# Patient Record
Sex: Male | Born: 1935 | Race: Black or African American | Hispanic: No | Marital: Married | Smoking: Never smoker
Health system: Southern US, Community
[De-identification: ages and names within clinical notes are randomized; demographics above are authoritative.]

## PROBLEM LIST (undated history)

## (undated) DIAGNOSIS — E785 Hyperlipidemia, unspecified: Secondary | ICD-10-CM

## (undated) DIAGNOSIS — L0390 Cellulitis, unspecified: Secondary | ICD-10-CM

## (undated) DIAGNOSIS — F419 Anxiety disorder, unspecified: Secondary | ICD-10-CM

## (undated) DIAGNOSIS — I2510 Atherosclerotic heart disease of native coronary artery without angina pectoris: Secondary | ICD-10-CM

## (undated) DIAGNOSIS — N183 Chronic kidney disease, stage 3 unspecified: Secondary | ICD-10-CM

## (undated) DIAGNOSIS — I10 Essential (primary) hypertension: Secondary | ICD-10-CM

## (undated) DIAGNOSIS — F0390 Unspecified dementia without behavioral disturbance: Secondary | ICD-10-CM

## (undated) DIAGNOSIS — F32A Depression, unspecified: Secondary | ICD-10-CM

## (undated) DIAGNOSIS — I5032 Chronic diastolic (congestive) heart failure: Secondary | ICD-10-CM

## (undated) DIAGNOSIS — K219 Gastro-esophageal reflux disease without esophagitis: Secondary | ICD-10-CM

## (undated) DIAGNOSIS — F329 Major depressive disorder, single episode, unspecified: Secondary | ICD-10-CM

## (undated) DIAGNOSIS — E669 Obesity, unspecified: Secondary | ICD-10-CM

## (undated) DIAGNOSIS — E119 Type 2 diabetes mellitus without complications: Secondary | ICD-10-CM

## (undated) DIAGNOSIS — J449 Chronic obstructive pulmonary disease, unspecified: Secondary | ICD-10-CM

## (undated) DIAGNOSIS — N289 Disorder of kidney and ureter, unspecified: Secondary | ICD-10-CM

---

## 2003-06-08 ENCOUNTER — Other Ambulatory Visit: Payer: Self-pay

## 2004-10-18 ENCOUNTER — Emergency Department: Payer: Self-pay | Admitting: Emergency Medicine

## 2004-10-18 ENCOUNTER — Other Ambulatory Visit: Payer: Self-pay

## 2004-10-21 ENCOUNTER — Other Ambulatory Visit: Payer: Self-pay

## 2004-10-21 ENCOUNTER — Inpatient Hospital Stay: Payer: Self-pay | Admitting: Internal Medicine

## 2004-11-16 ENCOUNTER — Other Ambulatory Visit: Payer: Self-pay

## 2004-11-16 ENCOUNTER — Emergency Department: Payer: Self-pay | Admitting: Emergency Medicine

## 2005-05-31 ENCOUNTER — Emergency Department: Payer: Self-pay | Admitting: Emergency Medicine

## 2005-05-31 ENCOUNTER — Other Ambulatory Visit: Payer: Self-pay

## 2006-04-13 ENCOUNTER — Emergency Department: Payer: Self-pay | Admitting: General Practice

## 2006-04-13 ENCOUNTER — Other Ambulatory Visit: Payer: Self-pay

## 2009-11-07 ENCOUNTER — Emergency Department: Payer: Self-pay | Admitting: Emergency Medicine

## 2010-01-25 ENCOUNTER — Ambulatory Visit: Payer: Self-pay | Admitting: Geriatric Medicine

## 2010-01-26 ENCOUNTER — Emergency Department: Payer: Self-pay | Admitting: Emergency Medicine

## 2010-01-31 ENCOUNTER — Ambulatory Visit: Payer: Self-pay | Admitting: Geriatric Medicine

## 2010-02-07 ENCOUNTER — Inpatient Hospital Stay: Payer: Self-pay | Admitting: Internal Medicine

## 2010-04-08 ENCOUNTER — Other Ambulatory Visit: Payer: Self-pay | Admitting: Geriatric Medicine

## 2011-01-31 ENCOUNTER — Encounter: Payer: Self-pay | Admitting: Cardiothoracic Surgery

## 2011-01-31 ENCOUNTER — Encounter: Payer: Self-pay | Admitting: Nurse Practitioner

## 2011-02-08 ENCOUNTER — Encounter: Payer: Self-pay | Admitting: Nurse Practitioner

## 2011-02-08 ENCOUNTER — Encounter: Payer: Self-pay | Admitting: Cardiothoracic Surgery

## 2011-03-08 ENCOUNTER — Encounter: Payer: Self-pay | Admitting: Nurse Practitioner

## 2011-03-08 ENCOUNTER — Encounter: Payer: Self-pay | Admitting: Cardiothoracic Surgery

## 2011-04-08 ENCOUNTER — Encounter: Payer: Self-pay | Admitting: Nurse Practitioner

## 2011-04-08 ENCOUNTER — Encounter: Payer: Self-pay | Admitting: Cardiothoracic Surgery

## 2011-04-08 LAB — WOUND CULTURE

## 2011-05-08 ENCOUNTER — Encounter: Payer: Self-pay | Admitting: Cardiothoracic Surgery

## 2011-05-08 ENCOUNTER — Encounter: Payer: Self-pay | Admitting: Nurse Practitioner

## 2011-06-08 ENCOUNTER — Encounter: Payer: Self-pay | Admitting: Cardiothoracic Surgery

## 2011-06-08 ENCOUNTER — Encounter: Payer: Self-pay | Admitting: Nurse Practitioner

## 2011-07-08 ENCOUNTER — Encounter: Payer: Self-pay | Admitting: Nurse Practitioner

## 2011-07-08 ENCOUNTER — Encounter: Payer: Self-pay | Admitting: Cardiothoracic Surgery

## 2011-08-08 ENCOUNTER — Encounter: Payer: Self-pay | Admitting: Nurse Practitioner

## 2011-08-08 ENCOUNTER — Encounter: Payer: Self-pay | Admitting: Cardiothoracic Surgery

## 2011-09-08 ENCOUNTER — Encounter: Payer: Self-pay | Admitting: Cardiothoracic Surgery

## 2011-09-08 ENCOUNTER — Encounter: Payer: Self-pay | Admitting: Nurse Practitioner

## 2011-10-08 ENCOUNTER — Encounter: Payer: Self-pay | Admitting: Cardiothoracic Surgery

## 2011-10-08 ENCOUNTER — Encounter: Payer: Self-pay | Admitting: Nurse Practitioner

## 2013-07-04 ENCOUNTER — Other Ambulatory Visit: Payer: Self-pay

## 2013-07-04 LAB — CBC WITH DIFFERENTIAL/PLATELET
BASOS ABS: 0 10*3/uL (ref 0.0–0.1)
Basophil %: 0.8 %
EOS ABS: 0.6 10*3/uL (ref 0.0–0.7)
EOS PCT: 9.8 %
HCT: 35.5 % — ABNORMAL LOW (ref 40.0–52.0)
HGB: 11.5 g/dL — ABNORMAL LOW (ref 13.0–18.0)
Lymphocyte #: 1.5 10*3/uL (ref 1.0–3.6)
Lymphocyte %: 25.7 %
MCH: 29.2 pg (ref 26.0–34.0)
MCHC: 32.5 g/dL (ref 32.0–36.0)
MCV: 90 fL (ref 80–100)
Monocyte #: 0.5 x10 3/mm (ref 0.2–1.0)
Monocyte %: 8.6 %
NEUTROS ABS: 3.2 10*3/uL (ref 1.4–6.5)
NEUTROS PCT: 55.1 %
PLATELETS: 177 10*3/uL (ref 150–440)
RBC: 3.96 10*6/uL — ABNORMAL LOW (ref 4.40–5.90)
RDW: 13.6 % (ref 11.5–14.5)
WBC: 5.9 10*3/uL (ref 3.8–10.6)

## 2013-07-04 LAB — COMPREHENSIVE METABOLIC PANEL
ALK PHOS: 59 U/L
ALT: 32 U/L (ref 12–78)
AST: 36 U/L (ref 15–37)
Albumin: 3.6 g/dL (ref 3.4–5.0)
Anion Gap: 6 — ABNORMAL LOW (ref 7–16)
BILIRUBIN TOTAL: 0.3 mg/dL (ref 0.2–1.0)
BUN: 21 mg/dL — AB (ref 7–18)
CALCIUM: 8.7 mg/dL (ref 8.5–10.1)
CHLORIDE: 98 mmol/L (ref 98–107)
Co2: 29 mmol/L (ref 21–32)
Creatinine: 1.81 mg/dL — ABNORMAL HIGH (ref 0.60–1.30)
EGFR (Non-African Amer.): 35 — ABNORMAL LOW
GFR CALC AF AMER: 40 — AB
Glucose: 83 mg/dL (ref 65–99)
Osmolality: 268 (ref 275–301)
Potassium: 4.3 mmol/L (ref 3.5–5.1)
Sodium: 133 mmol/L — ABNORMAL LOW (ref 136–145)
Total Protein: 6.7 g/dL (ref 6.4–8.2)

## 2013-07-04 LAB — URINALYSIS, COMPLETE
BLOOD: NEGATIVE
Bacteria: NONE SEEN
Bilirubin,UR: NEGATIVE
Glucose,UR: NEGATIVE mg/dL (ref 0–75)
Ketone: NEGATIVE
Leukocyte Esterase: NEGATIVE
Nitrite: NEGATIVE
PH: 8 (ref 4.5–8.0)
RBC,UR: 1 /HPF (ref 0–5)
SPECIFIC GRAVITY: 1.012 (ref 1.003–1.030)
Squamous Epithelial: 4
WBC UR: NONE SEEN /HPF (ref 0–5)

## 2014-10-10 ENCOUNTER — Other Ambulatory Visit
Admission: RE | Admit: 2014-10-10 | Discharge: 2014-10-10 | Disposition: A | Discharge status: Home / Self Care | Payer: Medicare Other | Source: Skilled Nursing Facility | Attending: Internal Medicine | Admitting: Internal Medicine

## 2014-10-10 DIAGNOSIS — Z029 Encounter for administrative examinations, unspecified: Secondary | ICD-10-CM | POA: Insufficient documentation

## 2014-10-10 LAB — CBC WITH DIFFERENTIAL/PLATELET
Basophils Absolute: 0 10*3/uL (ref 0–0.1)
Basophils Relative: 0 %
EOS PCT: 2 %
Eosinophils Absolute: 0.2 10*3/uL (ref 0–0.7)
HCT: 30.4 % — ABNORMAL LOW (ref 40.0–52.0)
Hemoglobin: 9.7 g/dL — ABNORMAL LOW (ref 13.0–18.0)
LYMPHS ABS: 0.8 10*3/uL — AB (ref 1.0–3.6)
LYMPHS PCT: 12 %
MCH: 28 pg (ref 26.0–34.0)
MCHC: 31.8 g/dL — ABNORMAL LOW (ref 32.0–36.0)
MCV: 88.2 fL (ref 80.0–100.0)
MONO ABS: 0.5 10*3/uL (ref 0.2–1.0)
MONOS PCT: 9 %
Neutro Abs: 4.8 10*3/uL (ref 1.4–6.5)
Neutrophils Relative %: 77 %
PLATELETS: 166 10*3/uL (ref 150–440)
RBC: 3.44 MIL/uL — AB (ref 4.40–5.90)
RDW: 15.7 % — ABNORMAL HIGH (ref 11.5–14.5)
WBC: 6.3 10*3/uL (ref 3.8–10.6)

## 2014-10-11 ENCOUNTER — Emergency Department: Payer: Medicare Other

## 2014-10-11 ENCOUNTER — Encounter: Payer: Self-pay | Admitting: *Deleted

## 2014-10-11 ENCOUNTER — Inpatient Hospital Stay
Admission: EM | Admit: 2014-10-11 | Discharge: 2014-10-17 | DRG: 291 | Disposition: A | Discharge status: Skilled Nursing Facility | Payer: Medicare Other | Attending: Internal Medicine | Admitting: Internal Medicine

## 2014-10-11 DIAGNOSIS — N183 Chronic kidney disease, stage 3 unspecified: Secondary | ICD-10-CM | POA: Diagnosis present

## 2014-10-11 DIAGNOSIS — Z79899 Other long term (current) drug therapy: Secondary | ICD-10-CM

## 2014-10-11 DIAGNOSIS — J449 Chronic obstructive pulmonary disease, unspecified: Secondary | ICD-10-CM | POA: Diagnosis present

## 2014-10-11 DIAGNOSIS — I5033 Acute on chronic diastolic (congestive) heart failure: Secondary | ICD-10-CM | POA: Diagnosis present

## 2014-10-11 DIAGNOSIS — J9621 Acute and chronic respiratory failure with hypoxia: Secondary | ICD-10-CM | POA: Diagnosis present

## 2014-10-11 DIAGNOSIS — Z794 Long term (current) use of insulin: Secondary | ICD-10-CM | POA: Diagnosis not present

## 2014-10-11 DIAGNOSIS — I130 Hypertensive heart and chronic kidney disease with heart failure and stage 1 through stage 4 chronic kidney disease, or unspecified chronic kidney disease: Secondary | ICD-10-CM | POA: Diagnosis present

## 2014-10-11 DIAGNOSIS — E875 Hyperkalemia: Secondary | ICD-10-CM | POA: Diagnosis present

## 2014-10-11 DIAGNOSIS — Z880 Allergy status to penicillin: Secondary | ICD-10-CM

## 2014-10-11 DIAGNOSIS — E1122 Type 2 diabetes mellitus with diabetic chronic kidney disease: Secondary | ICD-10-CM | POA: Diagnosis present

## 2014-10-11 DIAGNOSIS — K219 Gastro-esophageal reflux disease without esophagitis: Secondary | ICD-10-CM | POA: Diagnosis present

## 2014-10-11 DIAGNOSIS — J9601 Acute respiratory failure with hypoxia: Secondary | ICD-10-CM | POA: Diagnosis present

## 2014-10-11 DIAGNOSIS — I509 Heart failure, unspecified: Secondary | ICD-10-CM

## 2014-10-11 DIAGNOSIS — L03116 Cellulitis of left lower limb: Secondary | ICD-10-CM

## 2014-10-11 DIAGNOSIS — E669 Obesity, unspecified: Secondary | ICD-10-CM | POA: Diagnosis present

## 2014-10-11 DIAGNOSIS — I2510 Atherosclerotic heart disease of native coronary artery without angina pectoris: Secondary | ICD-10-CM | POA: Diagnosis present

## 2014-10-11 DIAGNOSIS — E119 Type 2 diabetes mellitus without complications: Secondary | ICD-10-CM

## 2014-10-11 DIAGNOSIS — I10 Essential (primary) hypertension: Secondary | ICD-10-CM | POA: Insufficient documentation

## 2014-10-11 HISTORY — DX: Chronic diastolic (congestive) heart failure: I50.32

## 2014-10-11 HISTORY — DX: Chronic obstructive pulmonary disease, unspecified: J44.9

## 2014-10-11 HISTORY — DX: Anxiety disorder, unspecified: F41.9

## 2014-10-11 HISTORY — DX: Hyperlipidemia, unspecified: E78.5

## 2014-10-11 HISTORY — DX: Gastro-esophageal reflux disease without esophagitis: K21.9

## 2014-10-11 HISTORY — DX: Disorder of kidney and ureter, unspecified: N28.9

## 2014-10-11 HISTORY — DX: Chronic kidney disease, stage 3 (moderate): N18.3

## 2014-10-11 HISTORY — DX: Type 2 diabetes mellitus without complications: E11.9

## 2014-10-11 HISTORY — DX: Chronic kidney disease, stage 3 unspecified: N18.30

## 2014-10-11 HISTORY — DX: Atherosclerotic heart disease of native coronary artery without angina pectoris: I25.10

## 2014-10-11 HISTORY — DX: Essential (primary) hypertension: I10

## 2014-10-11 HISTORY — DX: Obesity, unspecified: E66.9

## 2014-10-11 LAB — BASIC METABOLIC PANEL
Anion gap: 6 (ref 5–15)
BUN: 54 mg/dL — AB (ref 6–20)
CHLORIDE: 98 mmol/L — AB (ref 101–111)
CO2: 28 mmol/L (ref 22–32)
CREATININE: 1.59 mg/dL — AB (ref 0.61–1.24)
Calcium: 8.7 mg/dL — ABNORMAL LOW (ref 8.9–10.3)
GFR calc Af Amer: 46 mL/min — ABNORMAL LOW (ref >60)
GFR calc non Af Amer: 39 mL/min — ABNORMAL LOW (ref >60)
Glucose, Bld: 213 mg/dL — ABNORMAL HIGH (ref 65–99)
POTASSIUM: 5.4 mmol/L — AB (ref 3.5–5.1)
Sodium: 132 mmol/L — ABNORMAL LOW (ref 135–145)

## 2014-10-11 LAB — CBC WITH DIFFERENTIAL/PLATELET
Band Neutrophils: 2 %
Basophils Absolute: 0 10*3/uL (ref 0–0.1)
Basophils Relative: 0 %
Blasts: 0 %
Eosinophils Absolute: 0.1 10*3/uL (ref 0–0.7)
Eosinophils Relative: 3 %
HCT: 31.3 % — ABNORMAL LOW (ref 40.0–52.0)
HEMOGLOBIN: 10.1 g/dL — AB (ref 13.0–18.0)
Lymphocytes Relative: 21 %
Lymphs Abs: 1 10*3/uL (ref 1.0–3.6)
MCH: 28.2 pg (ref 26.0–34.0)
MCHC: 32.2 g/dL (ref 32.0–36.0)
MCV: 87.4 fL (ref 80.0–100.0)
MYELOCYTES: 0 %
Metamyelocytes Relative: 1 %
Monocytes Absolute: 0.3 10*3/uL (ref 0.2–1.0)
Monocytes Relative: 6 %
NEUTROS PCT: 67 %
NRBC: 1 /100{WBCs} — AB
Neutro Abs: 3.5 10*3/uL (ref 1.4–6.5)
Other: 0 %
PROMYELOCYTES ABS: 0 %
Platelets: 168 10*3/uL (ref 150–440)
RBC: 3.59 MIL/uL — AB (ref 4.40–5.90)
RDW: 15.6 % — ABNORMAL HIGH (ref 11.5–14.5)
WBC: 4.9 10*3/uL (ref 3.8–10.6)

## 2014-10-11 LAB — GLUCOSE, CAPILLARY
GLUCOSE-CAPILLARY: 47 mg/dL — AB (ref 65–99)
Glucose-Capillary: 141 mg/dL — ABNORMAL HIGH (ref 65–99)
Glucose-Capillary: 154 mg/dL — ABNORMAL HIGH (ref 65–99)
Glucose-Capillary: 88 mg/dL (ref 65–99)

## 2014-10-11 LAB — LACTIC ACID, PLASMA: Lactic Acid, Venous: 0.8 mmol/L (ref 0.5–2.0)

## 2014-10-11 LAB — COMPREHENSIVE METABOLIC PANEL
ALT: 29 U/L (ref 17–63)
AST: 25 U/L (ref 15–41)
Albumin: 4.1 g/dL (ref 3.5–5.0)
Alkaline Phosphatase: 71 U/L (ref 38–126)
Anion gap: 7 (ref 5–15)
BUN: 53 mg/dL — AB (ref 6–20)
CHLORIDE: 98 mmol/L — AB (ref 101–111)
CO2: 31 mmol/L (ref 22–32)
Calcium: 9.2 mg/dL (ref 8.9–10.3)
Creatinine, Ser: 1.44 mg/dL — ABNORMAL HIGH (ref 0.61–1.24)
GFR, EST AFRICAN AMERICAN: 51 mL/min — AB (ref >60)
GFR, EST NON AFRICAN AMERICAN: 44 mL/min — AB (ref >60)
Glucose, Bld: 64 mg/dL — ABNORMAL LOW (ref 65–99)
POTASSIUM: 5.1 mmol/L (ref 3.5–5.1)
Sodium: 136 mmol/L (ref 135–145)
Total Bilirubin: 0.2 mg/dL — ABNORMAL LOW (ref 0.3–1.2)
Total Protein: 7.5 g/dL (ref 6.5–8.1)

## 2014-10-11 LAB — BRAIN NATRIURETIC PEPTIDE: B NATRIURETIC PEPTIDE 5: 568 pg/mL — AB (ref 0.0–100.0)

## 2014-10-11 LAB — TROPONIN I

## 2014-10-11 MED ORDER — ACETAMINOPHEN 325 MG PO TABS
650.0000 mg | ORAL_TABLET | Freq: Four times a day (QID) | ORAL | Status: DC | PRN
  Administered 2014-10-13 – 2014-10-14 (×4): 650 mg via ORAL
  Filled 2014-10-11 (×4): qty 2

## 2014-10-11 MED ORDER — SIMVASTATIN 20 MG PO TABS
20.0000 mg | ORAL_TABLET | Freq: Every day | ORAL | Status: DC
  Administered 2014-10-12 – 2014-10-16 (×6): 20 mg via ORAL
  Filled 2014-10-11 (×6): qty 1

## 2014-10-11 MED ORDER — ONDANSETRON HCL 4 MG PO TABS: 4.0000 mg | ORAL_TABLET | Freq: Four times a day (QID) | ORAL | Status: DC | PRN

## 2014-10-11 MED ORDER — LISINOPRIL 20 MG PO TABS
20.0000 mg | ORAL_TABLET | Freq: Two times a day (BID) | ORAL | Status: DC
  Administered 2014-10-12 (×3): 20 mg via ORAL
  Filled 2014-10-11 (×3): qty 1

## 2014-10-11 MED ORDER — HYDRALAZINE HCL 20 MG/ML IJ SOLN
10.0000 mg | INTRAMUSCULAR | Status: DC | PRN
  Administered 2014-10-13: 20 mg via INTRAVENOUS
  Administered 2014-10-14 – 2014-10-15 (×4): 10 mg via INTRAVENOUS
  Filled 2014-10-11 (×6): qty 1

## 2014-10-11 MED ORDER — TORSEMIDE 20 MG PO TABS
60.0000 mg | ORAL_TABLET | Freq: Every day | ORAL | Status: DC
  Administered 2014-10-12 – 2014-10-13 (×2): 60 mg via ORAL
  Filled 2014-10-11 (×2): qty 3

## 2014-10-11 MED ORDER — METOPROLOL SUCCINATE ER 100 MG PO TB24
200.0000 mg | ORAL_TABLET | Freq: Every day | ORAL | Status: DC
  Administered 2014-10-12 – 2014-10-17 (×6): 200 mg via ORAL
  Filled 2014-10-11 (×6): qty 2

## 2014-10-11 MED ORDER — AMLODIPINE BESYLATE 10 MG PO TABS
10.0000 mg | ORAL_TABLET | Freq: Every day | ORAL | Status: DC
  Administered 2014-10-12 – 2014-10-17 (×6): 10 mg via ORAL
  Filled 2014-10-11 (×6): qty 1

## 2014-10-11 MED ORDER — DEXTROSE 50 % IV SOLN
INTRAVENOUS | Status: AC
  Administered 2014-10-11: 50 mL via INTRAVENOUS
  Filled 2014-10-11: qty 50

## 2014-10-11 MED ORDER — DEXTROSE 250 MG/ML IV SOLN: 25.0000 g | Freq: Once | INTRAVENOUS | Status: DC

## 2014-10-11 MED ORDER — FUROSEMIDE 10 MG/ML IJ SOLN
40.0000 mg | Freq: Once | INTRAMUSCULAR | Status: AC
Start: 2014-10-11 — End: 2014-10-11
  Administered 2014-10-11: 40 mg via INTRAVENOUS
  Filled 2014-10-11: qty 4

## 2014-10-11 MED ORDER — DIVALPROEX SODIUM ER 500 MG PO TB24
1000.0000 mg | ORAL_TABLET | Freq: Every day | ORAL | Status: DC
  Administered 2014-10-12 – 2014-10-16 (×6): 1000 mg via ORAL
  Filled 2014-10-11 (×6): qty 2

## 2014-10-11 MED ORDER — ENALAPRILAT 1.25 MG/ML IV INJ
1.2500 mg | INJECTION | Freq: Once | INTRAVENOUS | Status: AC
Start: 2014-10-11 — End: 2014-10-11
  Administered 2014-10-11: 1.25 mg via INTRAVENOUS
  Filled 2014-10-11: qty 2

## 2014-10-11 MED ORDER — SODIUM CHLORIDE 0.9 % IJ SOLN
3.0000 mL | Freq: Two times a day (BID) | INTRAMUSCULAR | Status: DC
  Administered 2014-10-12 – 2014-10-17 (×12): 3 mL via INTRAVENOUS

## 2014-10-11 MED ORDER — ASPIRIN EC 81 MG PO TBEC
81.0000 mg | DELAYED_RELEASE_TABLET | Freq: Every day | ORAL | Status: DC
  Administered 2014-10-12 – 2014-10-17 (×6): 81 mg via ORAL
  Filled 2014-10-11 (×6): qty 1

## 2014-10-11 MED ORDER — HEPARIN SODIUM (PORCINE) 5000 UNIT/ML IJ SOLN
5000.0000 [IU] | Freq: Three times a day (TID) | INTRAMUSCULAR | Status: DC
  Administered 2014-10-12 – 2014-10-17 (×18): 5000 [IU] via SUBCUTANEOUS
  Filled 2014-10-11 (×18): qty 1

## 2014-10-11 MED ORDER — HYDRALAZINE HCL 25 MG PO TABS
25.0000 mg | ORAL_TABLET | Freq: Two times a day (BID) | ORAL | Status: DC
  Administered 2014-10-12 (×2): 25 mg via ORAL
  Filled 2014-10-11 (×2): qty 1

## 2014-10-11 MED ORDER — SERTRALINE HCL 25 MG PO TABS
25.0000 mg | ORAL_TABLET | Freq: Every day | ORAL | Status: DC
  Administered 2014-10-12 – 2014-10-17 (×6): 25 mg via ORAL
  Filled 2014-10-11 (×6): qty 1

## 2014-10-11 MED ORDER — INSULIN ASPART 100 UNIT/ML ~~LOC~~ SOLN
0.0000 [IU] | Freq: Three times a day (TID) | SUBCUTANEOUS | Status: DC
Start: 2014-10-12 — End: 2014-10-17
  Administered 2014-10-12: 3 [IU] via SUBCUTANEOUS
  Administered 2014-10-12: 5 [IU] via SUBCUTANEOUS
  Administered 2014-10-12: 2 [IU] via SUBCUTANEOUS
  Administered 2014-10-13: 3 [IU] via SUBCUTANEOUS
  Administered 2014-10-13: 2 [IU] via SUBCUTANEOUS
  Administered 2014-10-13: 3 [IU] via SUBCUTANEOUS
  Administered 2014-10-14: 2 [IU] via SUBCUTANEOUS
  Administered 2014-10-14: 7 [IU] via SUBCUTANEOUS
  Administered 2014-10-14: 5 [IU] via SUBCUTANEOUS
  Administered 2014-10-15: 1 [IU] via SUBCUTANEOUS
  Administered 2014-10-15: 2 [IU] via SUBCUTANEOUS
  Administered 2014-10-16 – 2014-10-17 (×4): 1 [IU] via SUBCUTANEOUS
  Filled 2014-10-11: qty 2
  Filled 2014-10-11: qty 1
  Filled 2014-10-11 (×3): qty 3
  Filled 2014-10-11 (×2): qty 5
  Filled 2014-10-11: qty 2
  Filled 2014-10-11: qty 1
  Filled 2014-10-11: qty 5
  Filled 2014-10-11: qty 2
  Filled 2014-10-11 (×2): qty 1
  Filled 2014-10-11: qty 7
  Filled 2014-10-11: qty 2
  Filled 2014-10-11: qty 1

## 2014-10-11 MED ORDER — DEXTROSE 5 % IV SOLN
Freq: Once | INTRAVENOUS | Status: AC
  Administered 2014-10-11: 19:00:00 via INTRAVENOUS

## 2014-10-11 MED ORDER — FUROSEMIDE 10 MG/ML IJ SOLN
40.0000 mg | Freq: Once | INTRAMUSCULAR | Status: AC
  Administered 2014-10-12: 40 mg via INTRAVENOUS
  Filled 2014-10-11: qty 4

## 2014-10-11 MED ORDER — CLONIDINE HCL 0.1 MG PO TABS
0.1000 mg | ORAL_TABLET | Freq: Two times a day (BID) | ORAL | Status: DC
  Administered 2014-10-12 – 2014-10-13 (×4): 0.1 mg via ORAL
  Filled 2014-10-11 (×4): qty 1

## 2014-10-11 MED ORDER — NITROGLYCERIN 2 % TD OINT
1.0000 [in_us] | TOPICAL_OINTMENT | Freq: Once | TRANSDERMAL | Status: AC
  Administered 2014-10-11: 1 [in_us] via TOPICAL
  Filled 2014-10-11: qty 1

## 2014-10-11 MED ORDER — ACETAMINOPHEN 650 MG RE SUPP: 650.0000 mg | Freq: Four times a day (QID) | RECTAL | Status: DC | PRN

## 2014-10-11 MED ORDER — ONDANSETRON HCL 4 MG/2ML IJ SOLN: 4.0000 mg | Freq: Four times a day (QID) | INTRAMUSCULAR | Status: DC | PRN

## 2014-10-11 MED ORDER — DEXTROSE 50 % IV SOLN
50.0000 mL | Freq: Once | INTRAVENOUS | Status: AC
  Administered 2014-10-11: 50 mL via INTRAVENOUS

## 2014-10-11 MED ORDER — INSULIN ASPART 100 UNIT/ML ~~LOC~~ SOLN
0.0000 [IU] | Freq: Every day | SUBCUTANEOUS | Status: DC
  Administered 2014-10-12: 2 [IU] via SUBCUTANEOUS
  Administered 2014-10-13 – 2014-10-14 (×2): 3 [IU] via SUBCUTANEOUS
  Administered 2014-10-16: 1 [IU] via SUBCUTANEOUS
  Filled 2014-10-11: qty 3
  Filled 2014-10-11: qty 2
  Filled 2014-10-11: qty 1
  Filled 2014-10-11: qty 3

## 2014-10-11 NOTE — ED Provider Notes (Addendum)
[facility] Emergency Department Provider Note  ____________________________________________  Time seen: Approximately 5:35 PM  I have reviewed the triage vital signs and the nursing notes.   HISTORY  Chief Complaint Shortness of Breath  history limited by patient's speech impediment   HPI Billy Liu is a 81 y.o. male who comes from the nursing home with a history of increasing edema in his legs and increasing shortness of breath. Patient was placed on oxygen 2 L nasal cannula his sat still were low. With difficulty I understand the patient does say that he's been having gradually increasing shortness of breath for at least several days. His legs and swelling for at least several days the scrotum is swelling also. Patient's left leg is also red and warm although the knee  Most  consistent with cellulitis  Past Medical History  Diagnosis Date  . CHF (congestive heart failure) (HCC)   . COPD (chronic obstructive pulmonary disease) (HCC)   . Coronary artery disease   . Diabetes mellitus without complication (HCC)   . Hypertension   . Renal disorder   . GERD (gastroesophageal reflux disease)   . Hyperlipemia   . Anxiety   . Obesity     There are no active problems to display for this patient.   History reviewed. No pertinent past surgical history.  Current Outpatient Rx  Name  Route  Sig  Dispense  Refill  . acetaminophen (TYLENOL) 500 MG tablet   Oral   Take 500 mg by mouth 3 (three) times daily.         [name] amLODipine (NORVASC) 10 MG tablet   Oral   Take 10 mg by mouth daily.         [name] aspirin EC 81 MG tablet   Oral   Take 81 mg by mouth daily.         . capsaicin (ZOSTRIX) 0.025 % cream   Topical   Apply 1 application topically 3 (three) times daily. Pt applies to both lower legs and soles of feet.         . cloNIDine (CATAPRES) 0.1 MG tablet   Oral   Take 0.1 mg by mouth 2 (two) times daily.         . divalproex  (DEPAKOTE ER) 500 MG 24 hr tablet   Oral   Take 1,000 mg by mouth at bedtime.         . gabapentin (NEURONTIN) 100 MG capsule   Oral   Take 100 mg by mouth 2 (two) times daily.         . hydrALAZINE (APRESOLINE) 25 MG tablet   Oral   Take 25 mg by mouth 2 (two) times daily.         . insulin detemir (LEVEMIR) 100 UNIT/ML injection   Subcutaneous   Inject 34-46 Units into the skin 2 (two) times daily. Pt uses 46 units in the morning and 34 units in the evening.         . insulin lispro (HUMALOG) 100 UNIT/ML injection   Subcutaneous   Inject 12 Units into the skin 2 (two) times daily with breakfast and lunch.         . lisinopril (PRINIVIL,ZESTRIL) 20 MG tablet   Oral   Take 20 mg by mouth 2 (two) times daily.         . metoprolol (TOPROL-XL) 200 MG 24 hr tablet   Oral   Take 200 mg by mouth daily.         [name]  neomycin-bacitracin-polymyxin (NEOSPORIN) [phone] ointment   Topical   Apply 1 application topically 3 (three) times daily.         . potassium chloride (K-DUR) 10 MEQ tablet   Oral   Take 20 mEq by mouth 2 (two) times daily.         [name] senna (SENOKOT) 8.6 MG TABS tablet   Oral   Take 2 tablets by mouth at bedtime.         . sertraline (ZOLOFT) 25 MG tablet   Oral   Take 25 mg by mouth daily.         . simvastatin (ZOCOR) 10 MG tablet   Oral   Take 10 mg by mouth at bedtime. Pt takes with a  tablet.         . simvastatin (ZOCOR) 20 MG tablet   Oral   Take 20 mg by mouth at bedtime. Pt takes with a  tablet.         . torsemide (DEMADEX) 20 MG tablet   Oral   Take 60 mg by mouth daily.         . Vitamin D, Ergocalciferol, (DRISDOL) 50000 UNITS CAPS capsule   Oral   Take 50,000 Units by mouth every 30 (thirty) days. Pt takes on the 3rd of every month.         Weyman Croon Hazel (PREPARATION H EX)   Apply externally   Apply 1 application topically as needed (for burning).           Allergies Penicillins  History  reviewed. No pertinent family history.  Social History Social History  Substance Use Topics  . Smoking status: Unknown If Ever Smoked  . Smokeless tobacco: Never Used  . Alcohol Use: No    Review of Systems Unable to accomplish due to a speech impediment  ____________________________________________   PHYSICAL EXAM:  VITAL SIGNS: ED Triage Vitals  Enc Vitals Group     BP --      Pulse --      Resp --      Temp --      Temp src --      SpO2 --      Weight --      Height --      Head Cir --      Peak Flow --      Pain Score --      Pain Loc --      Pain Edu? --      Excl. in GC? --     Constitutional: Alert and oriented. Well appearing and in no acute distress. Eyes: Conjunctivae are normal. PERRL. EOMI. Head: Atraumatic. Nose: No congestion/rhinnorhea. Mouth/Throat: Mucous membranes are moist.  Oropharynx non-erythematous. Neck: No stridor Cardiovascular: Normal rate, regular rhythm. Grossly normal heart sounds.  Good peripheral circulation. Respiratory: Normal respiratory effort.  No retractions. Lungs  crackles halfway up Gastrointestinal: Soft and nontender. No distention. No abdominal bruits. No CVA tenderness. Genitourinary: Foley catheter in place scrotum swollen and enlarged. It is so large and basically varies the penis and scrotum Musculoskeletal:  Bilateral lower leg edema 4+ tense. Left leg is also erythematous and warm  No joint effusions. Neurologic:  Normal speech and language. No gross focal neurologic deficits are appreciated. No gait instability. Skin:  Skin is warm, dry and intact. No rash noted. Psychiatric: Mood and affect are normal. Speech and behavior are normal.  ____________________________________________   LABS (all labs ordered are listed, but only  abnormal results are displayed)  Labs Reviewed  COMPREHENSIVE METABOLIC PANEL - Abnormal; Notable for the following:    Chloride 98 (*)    Glucose, Bld 64 (*)    BUN 53 (*)     Creatinine, Ser 1.44 (*)    Total Bilirubin 0.2 (*)    GFR calc non Af Amer 44 (*)    GFR calc Af Amer 51 (*)    All other components within normal limits  BRAIN NATRIURETIC PEPTIDE - Abnormal; Notable for the following:    B Natriuretic Peptide 568.0 (*)    All other components within normal limits  CBC WITH DIFFERENTIAL/PLATELET - Abnormal; Notable for the following:    RBC 3.59 (*)    Hemoglobin 10.1 (*)    HCT 31.3 (*)    RDW 15.6 (*)    nRBC 1 (*)    All other components within normal limits  TROPONIN I  LACTIC ACID, PLASMA  LACTIC ACID, PLASMA   ____________________________________________  EKG  EKG read and interpreted by me shows sinus bradycardia rate of 52 normal axis actually normal EKG except the rate ____________________________________________  RADIOLOGY  chest x-ray read and interpreted by me shows massive pulmonary edema with enlarged heart ____________________________________________   PROCEDURES    ____________________________________________   INITIAL IMPRESSION / ASSESSMENT AND PLAN / ED COURSE  Pertinent labs & imaging results that were available during my care of the patient were reviewed by me and considered in my medical decision making (see chart for details). Patient's O2 sat was 79 without oxygen was 88 on 2 L patient put on BiPAP and then tolerated this very well  ____________________________________________   FINAL CLINICAL IMPRESSION(S) / ED DIAGNOSES  Final diagnoses:  Acute congestive heart failure, unspecified congestive heart failure type (HCC)  Cellulitis of left lower extremity      Arnaldo Natal, MD 10/11/14 1924  Ultrasound leg shows no DVT  Arnaldo Natal, MD 10/11/14 2055

## 2014-10-11 NOTE — ED Notes (Signed)
Informed Dr Darnelle Catalan of CBG 47, new orders 1 amp D50 and increase D5 infusion to 100 ml/hr.

## 2014-10-11 NOTE — H&P (Addendum)
[facility] [place] [facility]   PATIENT NAME: Billy Liu    MR#:  [id number]  DATE OF BIRTH:  03-24-1932  DATE OF ADMISSION:  10/11/2014  PRIMARY CARE PHYSICIAN: No primary care provider on file.   REQUESTING/REFERRING PHYSICIAN: Darnelle Catalan, M.D.  CHIEF COMPLAINT:   Chief Complaint  Patient presents with  . Shortness of Breath    HISTORY OF PRESENT ILLNESS:  Billy Liu  is a 81 y.o. male who presents with acute onset shortness of breath. Patient has a history of heart disease, and chronic heart failure with chronic lower extremity edema. However, he denies any symptoms of typical angina. He states that he was going about his normal activities today when he became suddenly short of breath. Shortness of breath progressed to the point that he needed to come to the ED for evaluation. He resides in a nursing facility, and staff there states that his blood pressure was significantly elevated with a systolic pressure in the 200s. They sent him for evaluation when his O2 sats began to drop. In the ED, workup initially was consistent with heart failure exacerbation. Chest x-ray showed significant fluid in his lungs. Blood pressure was elevated here as well, with systolic as high as 190s. His BNP was elevated in the 500s. He required BiPAP initially. He was given diuresis. His restaurant status improved and he was able to be weaned off the BiPAP. Hospitalists were called for admission for acute on chronic heart failure.  PAST MEDICAL HISTORY:   Past Medical History  Diagnosis Date  . Chronic diastolic CHF (congestive heart failure) (HCC)   . COPD (chronic obstructive pulmonary disease) (HCC)   . Coronary artery disease   . Diabetes mellitus without complication (HCC)   . Hypertension   . Renal disorder   . GERD (gastroesophageal reflux disease)   . Hyperlipemia   . Anxiety   . Obesity   . CKD (chronic kidney disease), stage III     PAST SURGICAL  HISTORY:  History reviewed. No pertinent past surgical history.  SOCIAL HISTORY:   Social History  Substance Use Topics  . Smoking status: Unknown If Ever Smoked  . Smokeless tobacco: Never Used  . Alcohol Use: No    FAMILY HISTORY:  History reviewed. No pertinent family history.  DRUG ALLERGIES:   Allergies  Allergen Reactions  . Penicillins Other (See Comments)    Reaction:  Unknown     MEDICATIONS AT HOME:   Prior to Admission medications   Medication Sig Start Date End Date Taking? Authorizing Provider  acetaminophen (TYLENOL) 500 MG tablet Take 500 mg by mouth 3 (three) times daily.   Yes Historical Provider, MD  amLODipine (NORVASC) 10 MG tablet Take 10 mg by mouth daily.   Yes Historical Provider, MD  aspirin EC 81 MG tablet Take 81 mg by mouth daily.   Yes Historical Provider, MD  capsaicin (ZOSTRIX) 0.025 % cream Apply 1 application topically 3 (three) times daily. Pt applies to both lower legs and soles of feet.   Yes Historical Provider, MD  cloNIDine (CATAPRES) 0.1 MG tablet Take 0.1 mg by mouth 2 (two) times daily.   Yes Historical Provider, MD  divalproex (DEPAKOTE ER) 500 MG 24 hr tablet Take 1,000 mg by mouth at bedtime.   Yes Historical Provider, MD  gabapentin (NEURONTIN) 100 MG capsule Take 100 mg by mouth 2 (two) times daily.   Yes Historical Provider, MD  hydrALAZINE (APRESOLINE) 25 MG tablet Take 25 mg  by mouth 2 (two) times daily.   Yes Historical Provider, MD  insulin detemir (LEVEMIR) 100 UNIT/ML injection Inject 34-46 Units into the skin 2 (two) times daily. Pt uses 46 units in the morning and 34 units in the evening.   Yes Historical Provider, MD  insulin lispro (HUMALOG) 100 UNIT/ML injection Inject 12 Units into the skin 2 (two) times daily with breakfast and lunch.   Yes Historical Provider, MD  lisinopril (PRINIVIL,ZESTRIL) 20 MG tablet Take 20 mg by mouth 2 (two) times daily.   Yes Historical Provider, MD  metoprolol (TOPROL-XL) 200 MG 24 hr  tablet Take 200 mg by mouth daily.   Yes Historical Provider, MD  neomycin-bacitracin-polymyxin (NEOSPORIN) [phone] ointment Apply 1 application topically 3 (three) times daily.   Yes Historical Provider, MD  potassium chloride (K-DUR) 10 MEQ tablet Take 20 mEq by mouth 2 (two) times daily. 10/11/14 10/13/14 Yes Historical Provider, MD  senna (SENOKOT) 8.6 MG TABS tablet Take 2 tablets by mouth at bedtime.   Yes Historical Provider, MD  sertraline (ZOLOFT) 25 MG tablet Take 25 mg by mouth daily.   Yes Historical Provider, MD  simvastatin (ZOCOR) 10 MG tablet Take 10 mg by mouth at bedtime. Pt takes with a 20mg  tablet.   Yes Historical Provider, MD  simvastatin (ZOCOR) 20 MG tablet Take 20 mg by mouth at bedtime. Pt takes with a 10mg  tablet.   Yes Historical Provider, MD  torsemide (DEMADEX) 20 MG tablet Take 60 mg by mouth daily.   Yes Historical Provider, MD  Vitamin D, Ergocalciferol, (DRISDOL) 50000 UNITS CAPS capsule Take 50,000 Units by mouth every 30 (thirty) days. Pt takes on the 3rd of every month.   Yes Historical Provider, MD  Witch Hazel (PREPARATION H EX) Apply 1 application topically as needed (for burning).   Yes Historical Provider, MD    REVIEW OF SYSTEMS:  Review of Systems  Constitutional: Negative for fever, chills, weight loss and malaise/fatigue.  HENT: Negative for ear pain, hearing loss and tinnitus.   Eyes: Negative for blurred vision, double vision, pain and redness.  Respiratory: Positive for shortness of breath. Negative for cough and hemoptysis.   Cardiovascular: Positive for leg swelling. Negative for chest pain, palpitations and orthopnea.  Gastrointestinal: Negative for nausea, vomiting, abdominal pain, diarrhea and constipation.  Genitourinary: Negative for dysuria, frequency and hematuria.  Musculoskeletal: Negative for back pain, joint pain and neck pain.  Skin:       No acne, rash, or lesions  Neurological: Negative for dizziness, tremors, focal weakness  and weakness.  Endo/Heme/Allergies: Negative for polydipsia. Does not bruise/bleed easily.  Psychiatric/Behavioral: Negative for depression. The patient is not nervous/anxious and does not have insomnia.      VITAL SIGNS:   Filed Vitals:   10/11/14 2100 10/11/14 2115 10/11/14 2130 10/11/14 2145  BP: 157/76 151/71 149/69 146/70  Pulse: 57 57 56 56  Resp:      Height:      Weight:      SpO2: 92% 92% 92% 94%   Wt Readings from Last 3 Encounters:  10/11/14 136.533 kg (301 lb)    PHYSICAL EXAMINATION:  Physical Exam  Vitals reviewed. Constitutional: He is oriented to person, place, and time. He appears well-developed and well-nourished. No distress.  HENT:  Head: Normocephalic and atraumatic.  Mouth/Throat: Oropharynx is clear and moist.  Eyes: Conjunctivae and EOM are normal. Pupils are equal, round, and reactive to light. No scleral icterus.  Neck: Normal range of motion. Neck supple.  No JVD present. No thyromegaly present.  Cardiovascular: Normal rate, regular rhythm and intact distal pulses.  Exam reveals no gallop and no friction rub.   No murmur heard. Respiratory: Effort normal. No respiratory distress. He has no wheezes. He has no rales.  Decreased air movement in bilateral bases.  GI: Soft. Bowel sounds are normal. He exhibits no distension. There is no tenderness.  Musculoskeletal: Normal range of motion. He exhibits no edema.  No arthritis, no gout  Lymphadenopathy:    He has no cervical adenopathy.  Neurological: He is alert and oriented to person, place, and time. No cranial nerve deficit.  No dysarthria, no aphasia  Skin: Skin is warm and dry. No rash noted. No erythema.  Psychiatric: He has a normal mood and affect. His behavior is normal. Judgment and thought content normal.  Patient is difficult to understand due to poor dentition and tooth loss. However, his family who is with him he reports that he seems to be at his baseline mentation.    LABORATORY PANEL:    CBC  Recent Labs Lab 10/11/14 1746  WBC 4.9  HGB 10.1*  HCT 31.3*  PLT 168   ------------------------------------------------------------------------------------------------------------------  Chemistries   Recent Labs Lab 10/11/14 1746  NA 136  K 5.1  CL 98*  CO2 31  GLUCOSE 64*  BUN 53*  CREATININE 1.44*  CALCIUM 9.2  AST 25  ALT 29  ALKPHOS 71  BILITOT 0.2*   ------------------------------------------------------------------------------------------------------------------  Cardiac Enzymes  Recent Labs Lab 10/11/14 1746  TROPONINI <0.03   ------------------------------------------------------------------------------------------------------------------  RADIOLOGY:  US Venous Img Lower Unilateral Left  10/11/2014   CLINICAL DATA:  Erythema and swelling of the left lower extremity for 3 years.  EXAM: Left LOWER EXTREMITY VENOUS DOPPLER ULTRASOUND  TECHNIQUE: Gray-scale sonography with graded compression, as well as color Doppler and duplex ultrasound were performed to evaluate the lower extremity deep venous systems from the level of the common femoral vein and including the common femoral, femoral, profunda femoral, popliteal and calf veins including the posterior tibial, peroneal and gastrocnemius veins when visible. The superficial great saphenous vein was also interrogated. Spectral Doppler was utilized to evaluate flow at rest and with distal augmentation maneuvers in the common femoral, femoral and popliteal veins.  COMPARISON:  None.  FINDINGS: Contralateral Common Femoral Vein: Respiratory phasicity is normal and symmetric with the symptomatic side. No evidence of thrombus. Normal compressibility.  Common Femoral Vein: No evidence of thrombus. Normal compressibility, respiratory phasicity and response to augmentation.  Saphenofemoral Junction: No evidence of thrombus. Normal compressibility and flow on color Doppler imaging.  Profunda Femoral Vein: No evidence of  thrombus. Normal compressibility and flow on color Doppler imaging.  Femoral Vein: No evidence of thrombus. Normal compressibility, respiratory phasicity and response to augmentation.  Popliteal Vein: No evidence of thrombus. Normal compressibility, respiratory phasicity and response to augmentation.  Calf Veins: Limited visibility due to body habitus and edema. Cannot confirm complete compressibility, but flow is visible on color Doppler.  Superficial Great Saphenous Vein: No evidence of thrombus. Normal compressibility and flow on color Doppler imaging.  Venous Reflux:  None.  Other Findings:  None.  IMPRESSION: No evidence of deep venous thrombosis. There are study limitations in the calf due to edema and body habitus.   Electronically Signed   By: Ellery Plunk M.D.   On: 10/11/2014 20:41   Dg Chest Portable 1 View  10/11/2014   CLINICAL DATA:  Shortness of breath.  Bilateral leg edema.  EXAM: PORTABLE  CHEST 1 VIEW  COMPARISON:  02/07/2010 and 04/13/2006  FINDINGS: There are moderate bilateral pleural effusions. Slight pulmonary vascular congestion. Heart size is within normal limits. No acute osseous abnormality.  IMPRESSION: Moderate bilateral pleural effusions with slight pulmonary vascular congestion.   Electronically Signed   By: Francene Boyers M.D.   On: 10/11/2014 17:39    EKG:   Orders placed or performed during the hospital encounter of 10/11/14  . ED EKG  . ED EKG    IMPRESSION AND PLAN:  Principal Problem:   Acute respiratory failure with hypoxia (HCC) - initially requiring BiPAP. Likely due to acute on chronic heart failure, perhaps a component of flash pulmonary edema due to accelerated hypertension. Diuresis and BiPAP and nitroglycerin improved patient's blood pressure and respiratory status. He was weaned off of BiPAP. He is now currently on nasal cannula with good oxygen saturation. Active Problems:   Acute on chronic diastolic (congestive) heart failure (HCC) - IV diuretics  given in the ED, we'll repeat this dose tonight as he had good urinary output, and improvement of respiratory symptoms. We'll control his blood pressure, and get a cardiology consult. We'll trend his cardiac enzymes tonight. Will not order echocardiogram as the patient's family states that he just saw Dr. Darrold Junker, his cardiologist, in the office yesterday and had an echocardiogram done there.   Accelerated HTN (hypertension) - blood pressure responded well to nitroglycerin IV antihypertensive. We'll continue home regimen antihypertensives, as well as IV antihypertensives when necessary for goal blood pressure less than 160/100   CAD (coronary artery disease) - continue home meds for this, trend cardiac enzymes, no report of angina at any time.   COPD (chronic obstructive pulmonary disease) (HCC) - continue home inhalers   Type 2 diabetes mellitus (HCC) - had an episode of low blood sugar in the ED. We will allow him a heart healthy/carb modified diet with sliding scale insulin coverage and appropriate fingerstick glucose checks before meals at bedtime. We'll hold his home insulin for now until we get a better feel for his true blood sugar trend.   CKD (chronic kidney disease), stage III - seems stable at his baseline. Avoid nephrotoxins, monitor closely.  All the records are reviewed and case discussed with ED provider. Management plans discussed with the patient and/or family.  DVT PROPHYLAXIS: SubQ heparin  ADMISSION STATUS: Inpatient  CODE STATUS: Full  TOTAL TIME TAKING CARE OF THIS PATIENT: 45 minutes.    Kathrina Crosley FIELDING 10/11/2014, 10:27 PM  [facility]  Office  [phone]  CC: Primary care physician; No primary care provider on file.

## 2014-10-11 NOTE — ED Notes (Signed)
Patient transported to Ultrasound 

## 2014-10-11 NOTE — ED Notes (Signed)
Pt returned from US

## 2014-10-11 NOTE — ED Notes (Signed)
Per EMS, pt from [facility] has had an increase in SOB and lower extremity swelling.  Facility placed Foley cath yesterday, placed pt on [place] O2.

## 2014-10-11 NOTE — ED Notes (Signed)
MD at bedside. 

## 2014-10-11 NOTE — ED Notes (Signed)
Called RT OK to remove pt from BiPap to go for [place] of left lower extremity.

## 2014-10-12 LAB — BASIC METABOLIC PANEL
Anion gap: 6 (ref 5–15)
BUN: 52 mg/dL — AB (ref 6–20)
CHLORIDE: 101 mmol/L (ref 101–111)
CO2: 29 mmol/L (ref 22–32)
CREATININE: 1.37 mg/dL — AB (ref 0.61–1.24)
Calcium: 9 mg/dL (ref 8.9–10.3)
GFR calc Af Amer: 55 mL/min — ABNORMAL LOW (ref >60)
GFR calc non Af Amer: 47 mL/min — ABNORMAL LOW (ref >60)
Glucose, Bld: 167 mg/dL — ABNORMAL HIGH (ref 65–99)
Potassium: 5.5 mmol/L — ABNORMAL HIGH (ref 3.5–5.1)
Sodium: 136 mmol/L (ref 135–145)

## 2014-10-12 LAB — CBC
HCT: 29.3 % — ABNORMAL LOW (ref 40.0–52.0)
HCT: 30 % — ABNORMAL LOW (ref 40.0–52.0)
HEMOGLOBIN: 9.4 g/dL — AB (ref 13.0–18.0)
Hemoglobin: 9.6 g/dL — ABNORMAL LOW (ref 13.0–18.0)
MCH: 27.9 pg (ref 26.0–34.0)
MCH: 28.1 pg (ref 26.0–34.0)
MCHC: 31.9 g/dL — ABNORMAL LOW (ref 32.0–36.0)
MCHC: 32.1 g/dL (ref 32.0–36.0)
MCV: 87.5 fL (ref 80.0–100.0)
MCV: 87.5 fL (ref 80.0–100.0)
PLATELETS: 167 10*3/uL (ref 150–440)
PLATELETS: 169 10*3/uL (ref 150–440)
RBC: 3.35 MIL/uL — AB (ref 4.40–5.90)
RBC: 3.43 MIL/uL — ABNORMAL LOW (ref 4.40–5.90)
RDW: 15.6 % — AB (ref 11.5–14.5)
RDW: 15.6 % — ABNORMAL HIGH (ref 11.5–14.5)
WBC: 6.6 10*3/uL (ref 3.8–10.6)
WBC: 6.8 10*3/uL (ref 3.8–10.6)

## 2014-10-12 LAB — HEMOGLOBIN A1C: Hgb A1c MFr Bld: 7.9 % — ABNORMAL HIGH (ref 4.0–6.0)

## 2014-10-12 LAB — CREATININE, SERUM
CREATININE: 1.37 mg/dL — AB (ref 0.61–1.24)
GFR calc Af Amer: 55 mL/min — ABNORMAL LOW (ref >60)
GFR, EST NON AFRICAN AMERICAN: 47 mL/min — AB (ref >60)

## 2014-10-12 LAB — GLUCOSE, CAPILLARY
Glucose-Capillary: 163 mg/dL — ABNORMAL HIGH (ref 65–99)
Glucose-Capillary: 291 mg/dL — ABNORMAL HIGH (ref 65–99)
Glucose-Capillary: 248 mg/dL — ABNORMAL HIGH (ref 65–99)
Glucose-Capillary: 237 mg/dL — ABNORMAL HIGH (ref 65–99)

## 2014-10-12 LAB — TROPONIN I: Troponin I: <0.03 ng/mL (ref <0.031)

## 2014-10-12 LAB — MRSA PCR SCREENING: MRSA by PCR: NEGATIVE

## 2014-10-12 MED ORDER — IPRATROPIUM-ALBUTEROL 0.5-2.5 (3) MG/3ML IN SOLN
3.0000 mL | Freq: Four times a day (QID) | RESPIRATORY_TRACT | Status: DC
  Administered 2014-10-12 – 2014-10-16 (×15): 3 mL via RESPIRATORY_TRACT
  Filled 2014-10-12 (×15): qty 3

## 2014-10-12 MED ORDER — FUROSEMIDE 10 MG/ML IJ SOLN
60.0000 mg | Freq: Once | INTRAMUSCULAR | Status: AC
  Administered 2014-10-12: 60 mg via INTRAVENOUS
  Filled 2014-10-12: qty 6

## 2014-10-12 MED ORDER — DIPHENHYDRAMINE HCL 50 MG/ML IJ SOLN
INTRAMUSCULAR | Status: AC
  Filled 2014-10-12: qty 1

## 2014-10-12 MED ORDER — DIPHENHYDRAMINE HCL 50 MG/ML IJ SOLN
12.5000 mg | Freq: Once | INTRAMUSCULAR | Status: AC
  Administered 2014-10-12: 12.5 mg via INTRAVENOUS

## 2014-10-12 MED ORDER — HYDRALAZINE HCL 25 MG PO TABS
25.0000 mg | ORAL_TABLET | Freq: Three times a day (TID) | ORAL | Status: DC
  Administered 2014-10-12 – 2014-10-13 (×2): 25 mg via ORAL
  Filled 2014-10-12 (×2): qty 1

## 2014-10-12 MED ORDER — DIPHENHYDRAMINE HCL 25 MG PO CAPS: 25.0000 mg | ORAL_CAPSULE | Freq: Four times a day (QID) | ORAL | Status: DC | PRN

## 2014-10-12 NOTE — Progress Notes (Signed)
   10/12/14 1330  Clinical Encounter Type  Visited With Patient  Visit Type Initial  Referral From Nurse  Consult/Referral To Nurse  Spiritual Encounters  Spiritual Needs Sacred text;Prayer  Stress Factors  Patient Stress Factors Exhausted;Health changes  Met w/patient to provide pastoral care, prayer, and copy of the Bible.   Chap. Rilda Bulls G. [place]

## 2014-10-12 NOTE — Progress Notes (Signed)
Pt arrived to unit via stretcher. Pt is A&O, slurred speech, on 4L of oxygen, skin warm and dry with scattered scabs and some ecchymosis to abdomen, bottom is little red but blanchable; dressing protocol started. Pt has redness to bilateral legs. Skin is tight; edematous, LLE more red and warmer than RLE. Scrotum is enlarged/ swollen. Witnessed by Serina Cowper, Charity fundraiser.

## 2014-10-12 NOTE — Progress Notes (Signed)
Patient ID: Billy Liu, male   DOB: 03-Oct-1932, 81 y.o.   MRN: [id number] [facility] PROGRESS NOTE  PCP: No primary care provider on file.  HPI/Subjective: Patient with some burning itching all over. No new medications except for nitroglycerin patch. Patient with some shortness of breath and increased leg swelling.  Objective: Filed Vitals:   10/12/14 1107  BP: 167/74  Pulse: 65  Temp: 98 F (36.7 C)  Resp: 20    Filed Weights   10/11/14 2342 10/12/14 0500 10/12/14 0546  Weight: 136.124 kg (300 lb 1.6 oz) 135.172 kg (298 lb) 135.172 kg (298 lb)    ROS: Review of Systems  Constitutional: Negative for fever and chills.  Eyes: Negative for blurred vision.  Respiratory: Positive for shortness of breath. Negative for cough.   Cardiovascular: Positive for orthopnea. Negative for chest pain.  Gastrointestinal: Negative for nausea, vomiting, abdominal pain, diarrhea and constipation.  Genitourinary: Negative for dysuria.  Musculoskeletal: Negative for joint pain.  Neurological: Negative for dizziness and headaches.   Exam: Physical Exam  Constitutional: He is oriented to person, place, and time.  HENT:  Nose: No mucosal edema.  Mouth/Throat: No oropharyngeal exudate or posterior oropharyngeal edema.  Eyes: Conjunctivae, EOM and lids are normal. Pupils are equal, round, and reactive to light.  Neck: JVD present. Carotid bruit is not present. No edema present. No thyroid mass and no thyromegaly present.  Cardiovascular: S1 normal and S2 normal.  Exam reveals no gallop.   No murmur heard. Pulses:      Dorsalis pedis pulses are 1+ on the right side, and 1+ on the left side.  Respiratory: No respiratory distress. He has no wheezes. He has no rhonchi. He has rales in the right lower field and the left lower field.  GI: Soft. Bowel sounds are normal. There is no tenderness.  Musculoskeletal:       Right ankle: He exhibits swelling.       Left ankle: He exhibits  swelling.  Lymphadenopathy:    He has no cervical adenopathy.  Neurological: He is alert and oriented to person, place, and time.  Slurred speech  Skin: Skin is warm. No rash noted. Nails show no clubbing.  Psychiatric: He has a normal mood and affect.    Data Reviewed: Basic Metabolic Panel:  Recent Labs Lab 10/10/14 2030 10/11/14 1746 10/12/14 0002 10/12/14 0554  NA 132* 136  --  136  K 5.4* 5.1  --  5.5*  CL 98* 98*  --  101  CO2 28 31  --  29  GLUCOSE 213* 64*  --  167*  BUN 54* 53*  --  52*  CREATININE 1.59* 1.44* 1.37* 1.37*  CALCIUM 8.7* 9.2  --  9.0   Liver Function Tests:  Recent Labs Lab 10/11/14 1746  AST 25  ALT 29  ALKPHOS 71  BILITOT 0.2*  PROT 7.5  ALBUMIN 4.1   CBC:  Recent Labs Lab 10/10/14 2030 10/11/14 1746 10/12/14 0002 10/12/14 0554  WBC 6.3 4.9 6.8 6.6  NEUTROABS 4.8 3.5  --   --   HGB 9.7* 10.1* 9.4* 9.6*  HCT 30.4* 31.3* 29.3* 30.0*  MCV 88.2 87.4 87.5 87.5  PLT 166 168 169 167   Cardiac Enzymes:  Recent Labs Lab 10/11/14 1746 10/12/14 0002 10/12/14 0554 10/12/14 1227  TROPONINI <0.03 <0.03 <0.03 <0.03   BNP (last 3 results)  Recent Labs  10/11/14 1746  BNP 568.0*    CBG:  Recent Labs Lab 10/11/14 2048  10/11/14 2311 10/11/14 2343 10/12/14 0723 10/12/14 1107  GLUCAP 88 141* 154* 163* 291*    Recent Results (from the past 240 hour(s))  MRSA PCR Screening     Status: None   Collection Time: 10/12/14  5:25 AM  Result Value Ref Range Status   MRSA by PCR NEGATIVE NEGATIVE Final    Comment:        The GeneXpert MRSA Assay (FDA approved for NASAL specimens only), is one component of a comprehensive MRSA colonization surveillance program. It is not intended to diagnose MRSA infection nor to guide or monitor treatment for MRSA infections.      Studies: US Venous Img Lower Unilateral Left  10/11/2014   CLINICAL DATA:  Erythema and swelling of the left lower extremity for 3 years.  EXAM: Left LOWER  EXTREMITY VENOUS DOPPLER ULTRASOUND  TECHNIQUE: Gray-scale sonography with graded compression, as well as color Doppler and duplex ultrasound were performed to evaluate the lower extremity deep venous systems from the level of the common femoral vein and including the common femoral, femoral, profunda femoral, popliteal and calf veins including the posterior tibial, peroneal and gastrocnemius veins when visible. The superficial great saphenous vein was also interrogated. Spectral Doppler was utilized to evaluate flow at rest and with distal augmentation maneuvers in the common femoral, femoral and popliteal veins.  COMPARISON:  None.  FINDINGS: Contralateral Common Femoral Vein: Respiratory phasicity is normal and symmetric with the symptomatic side. No evidence of thrombus. Normal compressibility.  Common Femoral Vein: No evidence of thrombus. Normal compressibility, respiratory phasicity and response to augmentation.  Saphenofemoral Junction: No evidence of thrombus. Normal compressibility and flow on color Doppler imaging.  Profunda Femoral Vein: No evidence of thrombus. Normal compressibility and flow on color Doppler imaging.  Femoral Vein: No evidence of thrombus. Normal compressibility, respiratory phasicity and response to augmentation.  Popliteal Vein: No evidence of thrombus. Normal compressibility, respiratory phasicity and response to augmentation.  Calf Veins: Limited visibility due to body habitus and edema. Cannot confirm complete compressibility, but flow is visible on color Doppler.  Superficial Great Saphenous Vein: No evidence of thrombus. Normal compressibility and flow on color Doppler imaging.  Venous Reflux:  None.  Other Findings:  None.  IMPRESSION: No evidence of deep venous thrombosis. There are study limitations in the calf due to edema and body habitus.   Electronically Signed   By: Ellery Plunk M.D.   On: 10/11/2014 20:41   Dg Chest Portable 1 View  10/11/2014   CLINICAL DATA:   Shortness of breath.  Bilateral leg edema.  EXAM: PORTABLE CHEST 1 VIEW  COMPARISON:  02/07/2010 and 04/13/2006  FINDINGS: There are moderate bilateral pleural effusions. Slight pulmonary vascular congestion. Heart size is within normal limits. No acute osseous abnormality.  IMPRESSION: Moderate bilateral pleural effusions with slight pulmonary vascular congestion.   Electronically Signed   By: Francene Boyers M.D.   On: 10/11/2014 17:39    Scheduled Meds: . amLODipine  10 mg Oral Daily  . aspirin EC  81 mg Oral Daily  . cloNIDine  0.1 mg Oral BID  . diphenhydrAMINE      . divalproex  1,000 mg Oral QHS  . heparin  5,000 Units Subcutaneous 3 times per day  . hydrALAZINE  25 mg Oral BID  . insulin aspart  0-5 Units Subcutaneous QHS  . insulin aspart  0-9 Units Subcutaneous TID WC  . lisinopril  20 mg Oral BID  . metoprolol  200 mg Oral Daily  .  sertraline  25 mg Oral Daily  . simvastatin  20 mg Oral QHS  . sodium chloride  3 mL Intravenous Q12H  . torsemide  60 mg Oral Daily    Assessment/Plan:  1. Acute respiratory failure with hypoxia- patient initially required BiPAP on presentation. Now he is down to nasal cannula. Will check pulse ox room air in the a.m. 2. Acute on chronic diastolic congestive heart failure- continue IV Lasix diuresis. Patient is on metoprolol and lisinopril. 3. Accelerated hypertension- continue his usual medications 4. Chronic kidney disease stage III with hyperkalemia. Continue Lasix today IV and recheck BMP tomorrow morning 5. History of COPD- start nebulizers 6. Diabetes mellitus- sliding scale  Code Status:     Code Status Orders        Start     Ordered   10/11/14 2348  Full code   Continuous     10/11/14 2347    Advance Directive Documentation        Most Recent Value   Type of Advance Directive  Healthcare Power of Attorney   Pre-existing out of facility DNR order (yellow form or pink MOST form)     "MOST" Form in Place?       Family  Communication: Family at bedside Disposition Plan: Back to rehabilitation soon  Consultants:  Cardiology  Time spent: 25 minutes  Alford Highland  [facility]

## 2014-10-12 NOTE — Progress Notes (Signed)
Patient was made an initial appointment at the [facility] on November 01, 2014 at 11:00am. Thank you.

## 2014-10-12 NOTE — Clinical Social Work Note (Signed)
Clinical Social Work Assessment  Patient Details  Name: Billy Liu MRN: [id number] Date of Birth: 26-Jun-1932  Date of referral:  10/12/14               Reason for consult:   (from [facility])                Permission sought to share information with:  Facility Medical sales representative, Family Supports Permission granted to share information::  Yes, Verbal Permission Granted  Name::      (brother and niece)  Agency::     Relationship::     Contact Information:     Housing/Transportation Living arrangements for the past 2 months:  Skilled Building surveyor of Information:  Patient, Facility Patient Interpreter Needed:  None Criminal Activity/Legal Involvement Pertinent to Current Situation/Hospitalization:  No - Comment as needed Significant Relationships:  Siblings, Other Family Members Lives with:  Facility Resident Do you feel safe going back to the place where you live?  Yes Need for family participation in patient care:  Yes (Comment)  Care giving concerns:  None at this time.   Social Worker assessment / plan:  Patient is an 81 year old male that currently resides at [facility] for long-term care for the past several years.  Patient is alert  and pleasant, he is min. engaged in conversation as his speech is difficult to understand.  Patient states his niece and brother are his support as he has no children.  Patient states he plans to return to [facility] at discharge.  Call to [facility], patient is ok to return.  Confirmed that patient has lived at facility for years.  States patient's brother is his responsible person.  Call to patient's brother Billy Liu [phone]  and [phone].  Left message to call CSW.  CSW will complete FL2 and place on chart in anticipation of patient returning to [facility] when medically cleared by MD.  Employment status:  Disabled (Comment on whether or not currently receiving Disability) Insurance information:  Medicaid In  [place], [employer] PT Recommendations:  Not assessed at this time Information / Referral to community resources:  Skilled Nursing Facility  Patient/Family's Response to care:  Patient is in agreement with plan  Patient/Family's Understanding of and Emotional Response to Diagnosis, Current Treatment, and Prognosis:  Patient understands he is under continued medical work up at this time.  Once cleared by MD patient will discharge back to [facility].  Emotional Assessment Appearance:  Appears stated age Attitude/Demeanor/Rapport:    Affect (typically observed):  Accepting, Appropriate, Pleasant Orientation:  Oriented to Self, Oriented to Place, Oriented to  Time, Oriented to Situation Alcohol / Substance use:    Psych involvement (Current and /or in the community):  No (Comment)  Discharge Needs  Concerns to be addressed:  Denies Needs/Concerns at this time Readmission within the last 30 days:  Yes Current discharge risk:  Chronically ill Barriers to Discharge:  No Barriers Identified   Billy Pilon, LCSW 10/12/2014, 2:49 PM Billy Liu. Billy Liu, MSW Clinical Social Work Department Emergency Room [phone] 2:51 PM

## 2014-10-12 NOTE — Consult Note (Signed)
[facility] Cardiology Consultation Note  Patient ID: Billy Liu, MRN: [id number], DOB/AGE: 16-Jul-1932 81 y.o. Admit date: 10/11/2014   Date of Consult: 10/12/2014 Primary Physician: No primary care provider on file. Primary Cardiologist: R[facility]  Chief Complaint:  Chief Complaint  Patient presents with  . Shortness of Breath   Reason for Consult: acute on chronic diastolic dysfunction heart failure   HPI: 81 y.o. male with known chronic kidney disease stage III diabetes with complication coronary artery disease without evidence of previous myocardial infarction or significant intervention and diastolic dysfunction heart failure with exacerbation consistent with acute on chronic diastolic dysfunction heart failure with increased lower extremity edema pulmonary edema shortness of breath PND orthopnea over the last 3-4 days without evidence of myocardial infarction with normal troponin. The patient has had improvements with intravenous Lasix and BiPAP machine as well as no evidence of significant infarction by EKG showing normal sinus rhythm with heart rate of 58 bpm and nonspecific ST changes. The patient is feeling slightly better after intravenous Lasix and now pressure control. The patient did have malignant hypertension for which was helped with reinstatement of appropriate medication management. The patient has had a recent echocardiogram showing normal LV systolic function with ejection fraction greater than 55% and moderate mitral and tricuspid regurgitation.   Past Medical History  Diagnosis Date  . Chronic diastolic CHF (congestive heart failure) (HCC)   . COPD (chronic obstructive pulmonary disease) (HCC)   . Coronary artery disease   . Diabetes mellitus without complication (HCC)   . Hypertension   . Renal disorder   . GERD (gastroesophageal reflux disease)   . Hyperlipemia   . Anxiety   . Obesity   . CKD (chronic kidney disease), stage III       Surgical History:  History reviewed. No pertinent past surgical history.   Home Meds: Prior to Admission medications   Medication Sig Start Date End Date Taking? Authorizing Provider  acetaminophen (TYLENOL) 500 MG tablet Take 500 mg by mouth 3 (three) times daily.   Yes Historical Provider, MD  amLODipine (NORVASC) 10 MG tablet Take 10 mg by mouth daily.   Yes Historical Provider, MD  aspirin EC 81 MG tablet Take 81 mg by mouth daily.   Yes Historical Provider, MD  capsaicin (ZOSTRIX) 0.025 % cream Apply 1 application topically 3 (three) times daily. Pt applies to both lower legs and soles of feet.   Yes Historical Provider, MD  cloNIDine (CATAPRES) 0.1 MG tablet Take 0.1 mg by mouth 2 (two) times daily.   Yes Historical Provider, MD  divalproex (DEPAKOTE ER) 500 MG 24 hr tablet Take 1,000 mg by mouth at bedtime.   Yes Historical Provider, MD  gabapentin (NEURONTIN) 100 MG capsule Take 100 mg by mouth 2 (two) times daily.   Yes Historical Provider, MD  hydrALAZINE (APRESOLINE) 25 MG tablet Take 25 mg by mouth 2 (two) times daily.   Yes Historical Provider, MD  insulin detemir (LEVEMIR) 100 UNIT/ML injection Inject 34-46 Units into the skin 2 (two) times daily. Pt uses 46 units in the morning and 34 units in the evening.   Yes Historical Provider, MD  insulin lispro (HUMALOG) 100 UNIT/ML injection Inject 12 Units into the skin 2 (two) times daily with breakfast and lunch.   Yes Historical Provider, MD  lisinopril (PRINIVIL,ZESTRIL) 20 MG tablet Take 20 mg by mouth 2 (two) times daily.   Yes Historical Provider, MD  metoprolol (TOPROL-XL) 200 MG 24 hr tablet Take 200  mg by mouth daily.   Yes Historical Provider, MD  neomycin-bacitracin-polymyxin (NEOSPORIN) [phone] ointment Apply 1 application topically 3 (three) times daily.   Yes Historical Provider, MD  potassium chloride (K-DUR) 10 MEQ tablet Take 20 mEq by mouth 2 (two) times daily. 10/11/14 10/13/14 Yes Historical Provider, MD  senna (SENOKOT) 8.6 MG TABS  tablet Take 2 tablets by mouth at bedtime.   Yes Historical Provider, MD  sertraline (ZOLOFT) 25 MG tablet Take 25 mg by mouth daily.   Yes Historical Provider, MD  simvastatin (ZOCOR) 10 MG tablet Take 10 mg by mouth at bedtime. Pt takes with a 20mg  tablet.   Yes Historical Provider, MD  simvastatin (ZOCOR) 20 MG tablet Take 20 mg by mouth at bedtime. Pt takes with a 10mg  tablet.   Yes Historical Provider, MD  torsemide (DEMADEX) 20 MG tablet Take 60 mg by mouth daily.   Yes Historical Provider, MD  Vitamin D, Ergocalciferol, (DRISDOL) 50000 UNITS CAPS capsule Take 50,000 Units by mouth every 30 (thirty) days. Pt takes on the 3rd of every month.   Yes Historical Provider, MD  Witch Hazel (PREPARATION H EX) Apply 1 application topically as needed (for burning).   Yes Historical Provider, MD    Inpatient Medications:  . amLODipine  10 mg Oral Daily  . aspirin EC  81 mg Oral Daily  . cloNIDine  0.1 mg Oral BID  . divalproex  1,000 mg Oral QHS  . heparin  5,000 Units Subcutaneous 3 times per day  . hydrALAZINE  25 mg Oral BID  . insulin aspart  0-5 Units Subcutaneous QHS  . insulin aspart  0-9 Units Subcutaneous TID WC  . lisinopril  20 mg Oral BID  . metoprolol  200 mg Oral Daily  . sertraline  25 mg Oral Daily  . simvastatin  20 mg Oral QHS  . sodium chloride  3 mL Intravenous Q12H  . torsemide  60 mg Oral Daily      Allergies:  Allergies  Allergen Reactions  . Penicillins Other (See Comments)    Reaction:  Unknown     Social History   Social History  . Marital Status: Married    Spouse Name: N/A  . Number of Children: N/A  . Years of Education: N/A   Occupational History  . Not on file.   Social History Main Topics  . Smoking status: Unknown If Ever Smoked  . Smokeless tobacco: Never Used  . Alcohol Use: No  . Drug Use: Not on file  . Sexual Activity: Not on file   Other Topics Concern  . Not on file   Social History Narrative  . No narrative on file      History reviewed. No pertinent family history.   Review of Systems Positive for Patient is unable to assess due to dementia.  Labs:  Recent Labs  10/11/14 1746 10/12/14 0002  TROPONINI <0.03 <0.03   Lab Results  Component Value Date   WBC 6.6 10/12/2014   HGB 9.6* 10/12/2014   HCT 30.0* 10/12/2014   MCV 87.5 10/12/2014   PLT 167 10/12/2014    Recent Labs Lab 10/11/14 1746 10/12/14 0002  NA 136  --   K 5.1  --   CL 98*  --   CO2 31  --   BUN 53*  --   CREATININE 1.44* 1.37*  CALCIUM 9.2  --   PROT 7.5  --   BILITOT 0.2*  --   ALKPHOS 71  --  ALT 29  --   AST 25  --   GLUCOSE 64*  --    No results found for: CHOL, HDL, LDLCALC, TRIG No results found for: DDIMER  Radiology/Studies:  US Venous Img Lower Unilateral Left  10/11/2014   CLINICAL DATA:  Erythema and swelling of the left lower extremity for 3 years.  EXAM: Left LOWER EXTREMITY VENOUS DOPPLER ULTRASOUND  TECHNIQUE: Gray-scale sonography with graded compression, as well as color Doppler and duplex ultrasound were performed to evaluate the lower extremity deep venous systems from the level of the common femoral vein and including the common femoral, femoral, profunda femoral, popliteal and calf veins including the posterior tibial, peroneal and gastrocnemius veins when visible. The superficial great saphenous vein was also interrogated. Spectral Doppler was utilized to evaluate flow at rest and with distal augmentation maneuvers in the common femoral, femoral and popliteal veins.  COMPARISON:  None.  FINDINGS: Contralateral Common Femoral Vein: Respiratory phasicity is normal and symmetric with the symptomatic side. No evidence of thrombus. Normal compressibility.  Common Femoral Vein: No evidence of thrombus. Normal compressibility, respiratory phasicity and response to augmentation.  Saphenofemoral Junction: No evidence of thrombus. Normal compressibility and flow on color Doppler imaging.  Profunda Femoral  Vein: No evidence of thrombus. Normal compressibility and flow on color Doppler imaging.  Femoral Vein: No evidence of thrombus. Normal compressibility, respiratory phasicity and response to augmentation.  Popliteal Vein: No evidence of thrombus. Normal compressibility, respiratory phasicity and response to augmentation.  Calf Veins: Limited visibility due to body habitus and edema. Cannot confirm complete compressibility, but flow is visible on color Doppler.  Superficial Great Saphenous Vein: No evidence of thrombus. Normal compressibility and flow on color Doppler imaging.  Venous Reflux:  None.  Other Findings:  None.  IMPRESSION: No evidence of deep venous thrombosis. There are study limitations in the calf due to edema and body habitus.   Electronically Signed   By: Ellery Plunk M.D.   On: 10/11/2014 20:41   Dg Chest Portable 1 View  10/11/2014   CLINICAL DATA:  Shortness of breath.  Bilateral leg edema.  EXAM: PORTABLE CHEST 1 VIEW  COMPARISON:  02/07/2010 and 04/13/2006  FINDINGS: There are moderate bilateral pleural effusions. Slight pulmonary vascular congestion. Heart size is within normal limits. No acute osseous abnormality.  IMPRESSION: Moderate bilateral pleural effusions with slight pulmonary vascular congestion.   Electronically Signed   By: Francene Boyers M.D.   On: 10/11/2014 17:39    EKG: sinus bradycardia   Weights: Filed Weights   10/11/14 2342 10/12/14 0500 10/12/14 0546  Weight: 300 lb 1.6 oz (136.124 kg) 298 lb (135.172 kg) 298 lb (135.172 kg)     Physical Exam: Blood pressure 175/78, pulse 62, temperature 98 F (36.7 C), resp. rate 16, height 6' (1.829 m), weight 298 lb (135.172 kg), SpO2 94 %. Body mass index is 40.41 kg/(m^2). General: Well developed, well nourished, in no acute distress. Head eyes ears nose throat: Normocephalic, atraumatic, sclera non-icteric, no xanthomas, nares are without discharge. No apparent thyromegaly and/or mass  Lungs: Normal  respiratory effort.   few wheezes basila rales, no rhonchi.  Heart: RRR with normal S1 S2. no murmur gallop, no rub, PMI is diffuseplacement, carotid upstroke normal without bruit, jugular venous pressure is normal Abdomen: Soft, non-tender, non-distended with normoactive bowel sounds. No hepatomegaly. No rebound/guarding. No obvious abdominal masses. Abdominal aorta is normal size without bruit Extremities:2+edema. no cyanosis, no clubbing, no ulcers  Peripheral : 2+ bilateral upper  extremity pulses, 2+ bilateral femoral pulses, 2+ bilateral dorsal pedal pulse Neuro:not  Alert and oriented. No facial asymmetry. No focal deficit. Moves all extremities spontaneously. Musculoskeletal: Normal muscle tone without kyphosis     Assessment: 81 year old male with chronic kidney disease stage III diabetes with complication coronary artery disease without myocardial infarction having acute on chronic diastolic dysfunction heart failure multifactorial in nature including chronic kidney disease and possible dietary indiscretion  Plan:  1. Continue intravenous Lasix for 24 more hours and reinstate torsemide at 60 mg each day which she was previously on watching closely for current concerns of chronic kidney disease 2. Continue hypertension control with metoprolol lisinopril hydralazine and clonidine with amlodipine which appears to be improving overnight 3. Patient is to be counseled for low sodium diet which is likely exacerbating his issues listed above 4. No further cardiac diagnostics necessary at this time 5. Begin ambulation and if patient is breathing much better with improvements of symptoms and congestive heart failure is okay for discharge to home from cardiac standpoint with follow-up next week gned, Lamar Blinks M.D. [facility] Cardiology 10/12/2014, 7:47 AM

## 2014-10-12 NOTE — Care Management (Signed)
This patient appears to be from [facility]. CSW consult placed this AM. No RNCM needs at this time.

## 2014-10-12 NOTE — Progress Notes (Signed)
Inpatient Diabetes Program Recommendations  AACE/ADA: New Consensus Statement on Inpatient Glycemic Control (2015)  Target Ranges:  Prepandial:   less than 140 mg/dL      Peak postprandial:   less than 180 mg/dL (1-2 hours)      Critically ill patients:  140 - 180 mg/dL   Review of Glycemic Control:  Results for KEATIN, BENHAM (MRN [id number]) as of 10/12/2014 13:58  Ref. Range 10/11/2014 23:11 10/11/2014 23:43 10/12/2014 07:23 10/12/2014 11:07  Glucose-Capillary Latest Ref Range: 65-99 mg/dL 409 (H) 811 (H) 914 (H) 291 (H)   Diabetes history: Type 2 diabetes Outpatient Diabetes medications: Levemir 46 units q AM and 34 units q PM, Humalog 12 units tid with meals Current orders for Inpatient glycemic control:  Novolog sensitive tid with meals and HS  Inpatient Diabetes Program Recommendations:    Please consider adding 1/2 of home dose of basal insulin while patient is in the hospital.  Consider Levemir 23 units q AM and 17 units q PM.  Also consider adding Novolog 5 units tid with meals (hold if patient eats less than 50%).  Thanks, Beryl Meager, RN, BC-ADM Inpatient Diabetes Coordinator Pager [phone] (8a-5p)

## 2014-10-13 ENCOUNTER — Encounter: Payer: Self-pay | Admitting: *Deleted

## 2014-10-13 LAB — BASIC METABOLIC PANEL
ANION GAP: 6 (ref 5–15)
Anion gap: 5 (ref 5–15)
BUN: 51 mg/dL — AB (ref 6–20)
BUN: 45 mg/dL — ABNORMAL HIGH (ref 6–20)
CHLORIDE: 99 mmol/L — AB (ref 101–111)
CHLORIDE: 95 mmol/L — AB (ref 101–111)
CO2: 33 mmol/L — ABNORMAL HIGH (ref 22–32)
CO2: 35 mmol/L — AB (ref 22–32)
CREATININE: 1.47 mg/dL — AB (ref 0.61–1.24)
CREATININE: 1.32 mg/dL — AB (ref 0.61–1.24)
Calcium: 9 mg/dL (ref 8.9–10.3)
Calcium: 9.2 mg/dL (ref 8.9–10.3)
GFR calc Af Amer: 50 mL/min — ABNORMAL LOW (ref >60)
GFR calc non Af Amer: 43 mL/min — ABNORMAL LOW (ref >60)
GFR calc non Af Amer: 49 mL/min — ABNORMAL LOW (ref >60)
GFR, EST AFRICAN AMERICAN: 57 mL/min — AB (ref >60)
GLUCOSE: 171 mg/dL — AB (ref 65–99)
Glucose, Bld: 250 mg/dL — ABNORMAL HIGH (ref 65–99)
Potassium: 5.7 mmol/L — ABNORMAL HIGH (ref 3.5–5.1)
Potassium: 5.6 mmol/L — ABNORMAL HIGH (ref 3.5–5.1)
SODIUM: 137 mmol/L (ref 135–145)
Sodium: 136 mmol/L (ref 135–145)

## 2014-10-13 LAB — GLUCOSE, CAPILLARY
GLUCOSE-CAPILLARY: 197 mg/dL — AB (ref 65–99)
GLUCOSE-CAPILLARY: 295 mg/dL — AB (ref 65–99)
Glucose-Capillary: 236 mg/dL — ABNORMAL HIGH (ref 65–99)
Glucose-Capillary: 218 mg/dL — ABNORMAL HIGH (ref 65–99)

## 2014-10-13 MED ORDER — BUDESONIDE 0.25 MG/2ML IN SUSP
0.2500 mg | Freq: Two times a day (BID) | RESPIRATORY_TRACT | Status: DC
  Administered 2014-10-13 – 2014-10-17 (×8): 0.25 mg via RESPIRATORY_TRACT
  Filled 2014-10-13 (×8): qty 2

## 2014-10-13 MED ORDER — DEXTROSE 50 % IV SOLN
1.0000 | Freq: Once | INTRAVENOUS | Status: AC
  Administered 2014-10-13: 50 mL via INTRAVENOUS
  Filled 2014-10-13: qty 50

## 2014-10-13 MED ORDER — INSULIN ASPART 100 UNIT/ML ~~LOC~~ SOLN
10.0000 [IU] | Freq: Once | SUBCUTANEOUS | Status: AC
  Administered 2014-10-13: 10 [IU] via INTRAVENOUS
  Filled 2014-10-13: qty 10

## 2014-10-13 MED ORDER — CLONIDINE HCL 0.1 MG PO TABS
0.1000 mg | ORAL_TABLET | Freq: Three times a day (TID) | ORAL | Status: DC
  Administered 2014-10-13 – 2014-10-14 (×3): 0.1 mg via ORAL
  Filled 2014-10-13 (×3): qty 1

## 2014-10-13 MED ORDER — CLONIDINE HCL 0.1 MG PO TABS
0.2000 mg | ORAL_TABLET | Freq: Once | ORAL | Status: AC
  Administered 2014-10-13: 0.2 mg via ORAL
  Filled 2014-10-13: qty 2

## 2014-10-13 MED ORDER — SODIUM CHLORIDE 0.9 % IV SOLN
1.0000 g | Freq: Once | INTRAVENOUS | Status: AC
  Administered 2014-10-13: 1 g via INTRAVENOUS
  Filled 2014-10-13: qty 10

## 2014-10-13 MED ORDER — SODIUM BICARBONATE 8.4 % IV SOLN
50.0000 meq | Freq: Once | INTRAVENOUS | Status: AC
  Administered 2014-10-13: 50 meq via INTRAVENOUS
  Filled 2014-10-13: qty 50

## 2014-10-13 MED ORDER — HYDRALAZINE HCL 50 MG PO TABS
50.0000 mg | ORAL_TABLET | Freq: Four times a day (QID) | ORAL | Status: DC
  Administered 2014-10-13 – 2014-10-15 (×9): 50 mg via ORAL
  Filled 2014-10-13 (×10): qty 1

## 2014-10-13 MED ORDER — FUROSEMIDE 10 MG/ML IJ SOLN
60.0000 mg | Freq: Two times a day (BID) | INTRAMUSCULAR | Status: DC
  Administered 2014-10-13 – 2014-10-17 (×8): 60 mg via INTRAVENOUS
  Filled 2014-10-13 (×8): qty 6

## 2014-10-13 MED ORDER — INSULIN DETEMIR 100 UNIT/ML ~~LOC~~ SOLN
23.0000 [IU] | Freq: Every day | SUBCUTANEOUS | Status: DC
  Administered 2014-10-14: 23 [IU] via SUBCUTANEOUS
  Filled 2014-10-13: qty 0.23

## 2014-10-13 MED ORDER — SODIUM POLYSTYRENE SULFONATE 15 GM/60ML PO SUSP
30.0000 g | Freq: Once | ORAL | Status: AC
  Administered 2014-10-13: 30 g via ORAL
  Filled 2014-10-13: qty 120

## 2014-10-13 MED ORDER — INSULIN DETEMIR 100 UNIT/ML ~~LOC~~ SOLN
17.0000 [IU] | Freq: Every day | SUBCUTANEOUS | Status: DC
  Administered 2014-10-13: 17 [IU] via SUBCUTANEOUS
  Filled 2014-10-13 (×2): qty 0.17

## 2014-10-13 MED ORDER — CEPHALEXIN 250 MG PO CAPS
250.0000 mg | ORAL_CAPSULE | Freq: Three times a day (TID) | ORAL | Status: DC
  Administered 2014-10-13 – 2014-10-14 (×4): 250 mg via ORAL
  Filled 2014-10-13 (×4): qty 1

## 2014-10-13 NOTE — Progress Notes (Signed)
Inpatient Diabetes Program Recommendations  AACE/ADA: New Consensus Statement on Inpatient Glycemic Control (2015)  Target Ranges:  Prepandial:   less than 140 mg/dL      Peak postprandial:   less than 180 mg/dL (1-2 hours)      Critically ill patients:  140 - 180 mg/dL  Review of Glycemic Control  Results for Billy Liu, Billy Liu (MRN [id number]) as of 10/13/2014 08:52  Ref. Range 10/12/2014 07:23 10/12/2014 11:07 10/12/2014 16:07 10/12/2014 19:47 10/13/2014 07:32  Glucose-Capillary Latest Ref Range: 65-99 mg/dL 782 (H) 956 (H) 213 (H) 237 (H) 236 (H)    Diabetes history: Type 2 diabetes Outpatient Diabetes medications: Levemir 46 units q AM and 34 units q PM, Humalog 12 units tid with meals Current orders for Inpatient glycemic control:  Novolog sensitive tid with meals and HS  Inpatient Diabetes Program Recommendations:    Please consider adding 1/2 of home dose of basal insulin while patient is in the hospital. Consider Levemir 23 units q AM and 17 units q PM. Also consider adding Novolog 3 units tid with meals (hold if patient eats less than 50%).  Susette Racer, RN, BA, MHA, CDE Diabetes Coordinator Inpatient Diabetes Program  [phone] (Team Pager) [phone] [facility] Office) 10/13/2014 8:53 AM

## 2014-10-13 NOTE — Progress Notes (Signed)
Alert and oriented. Sinus rhythm on tele. Blood pressure elevated today, now stabilized with PO meds. Patient has had no complaints except some chronic leg pain, that he states he only takes tylenol for at home and doesn't need anything else. Intermittent periods of shortness of breath today. Nasal cannula elevated from 2.5L to 3.5L today. Continuous pulse ox is on. Medications for elevated potassium given, K+ only came down to 5.6 from 5.7. No further needs, will continue to monitor.

## 2014-10-13 NOTE — Progress Notes (Signed)
Patient ID: Billy Liu, male   DOB: 1932/09/15, 81 y.o.   MRN: [id number] Patient ID: Billy Liu, male   DOB: 1932/01/21, 82 y.o.   MRN: [id number] [facility] PROGRESS NOTE  PCP: No primary care provider on file.  HPI/Subjective: Patient with some burning itching all over. No new medications except for nitroglycerin patch. Patient with some shortness of breath and increased leg swelling.  Objective: Filed Vitals:   10/13/14 0950  BP: 205/71  Pulse: 79  Temp:   Resp:     Filed Weights   10/12/14 0500 10/12/14 0546 10/13/14 0445  Weight: 135.172 kg (298 lb) 135.172 kg (298 lb) 134.945 kg (297 lb 8 oz)    ROS: Review of Systems  Constitutional: Negative for fever and chills.  Eyes: Negative for blurred vision.  Respiratory: Positive for shortness of breath. Negative for cough.   Cardiovascular: Positive for orthopnea. Negative for chest pain.  Gastrointestinal: Negative for nausea, vomiting, abdominal pain, diarrhea and constipation.  Genitourinary: Negative for dysuria.  Musculoskeletal: Negative for joint pain.  Neurological: Negative for dizziness and headaches.   Exam: Physical Exam  Constitutional: He is oriented to person, place, and time.  HENT:  Nose: No mucosal edema.  Mouth/Throat: No oropharyngeal exudate or posterior oropharyngeal edema.  Eyes: Conjunctivae, EOM and lids are normal. Pupils are equal, round, and reactive to light.  Neck: JVD present. Carotid bruit is not present. No edema present. No thyroid mass and no thyromegaly present.  Cardiovascular: S1 normal and S2 normal.  Exam reveals no gallop.   No murmur heard. Pulses:      Dorsalis pedis pulses are 1+ on the right side, and 1+ on the left side.  Respiratory: No respiratory distress. He has no wheezes. He has no rhonchi. He has rales in the right lower field and the left lower field.  GI: Soft. Bowel sounds are normal. There is no tenderness.  Genitourinary:  4 plus scrotal  edema  Musculoskeletal:       Right ankle: He exhibits swelling.       Left ankle: He exhibits swelling.  Lymphadenopathy:    He has no cervical adenopathy.  Neurological: He is alert and oriented to person, place, and time.  Slurred speech  Skin: Skin is warm. Nails show no clubbing.  Patient has chronic lower extremity discoloration but on the left lower extremity is a little warm today and a little more red than yesterday  Psychiatric: He has a normal mood and affect.    Data Reviewed: Basic Metabolic Panel:  Recent Labs Lab 10/10/14 2030 10/11/14 1746 10/12/14 0002 10/12/14 0554 10/13/14 0432  NA 132* 136  --  136 137  K 5.4* 5.1  --  5.5* 5.7*  CL 98* 98*  --  101 99*  CO2 28 31  --  29 33*  GLUCOSE 213* 64*  --  167* 171*  BUN 54* 53*  --  52* 51*  CREATININE 1.59* 1.44* 1.37* 1.37* 1.47*  CALCIUM 8.7* 9.2  --  9.0 9.0   Liver Function Tests:  Recent Labs Lab 10/11/14 1746  AST 25  ALT 29  ALKPHOS 71  BILITOT 0.2*  PROT 7.5  ALBUMIN 4.1   CBC:  Recent Labs Lab 10/10/14 2030 10/11/14 1746 10/12/14 0002 10/12/14 0554  WBC 6.3 4.9 6.8 6.6  NEUTROABS 4.8 3.5  --   --   HGB 9.7* 10.1* 9.4* 9.6*  HCT 30.4* 31.3* 29.3* 30.0*  MCV 88.2 87.4 87.5 87.5  PLT  166 168 169 167   Cardiac Enzymes:  Recent Labs Lab 10/11/14 1746 10/12/14 0002 10/12/14 0554 10/12/14 1227  TROPONINI <0.03 <0.03 <0.03 <0.03   BNP (last 3 results)  Recent Labs  10/11/14 1746  BNP 568.0*    CBG:  Recent Labs Lab 10/12/14 0723 10/12/14 1107 10/12/14 1607 10/12/14 1947 10/13/14 0732  GLUCAP 163* 291* 248* 237* 236*    Recent Results (from the past 240 hour(s))  MRSA PCR Screening     Status: None   Collection Time: 10/12/14  5:25 AM  Result Value Ref Range Status   MRSA by PCR NEGATIVE NEGATIVE Final    Comment:        The GeneXpert MRSA Assay (FDA approved for NASAL specimens only), is one component of a comprehensive MRSA colonization surveillance  program. It is not intended to diagnose MRSA infection nor to guide or monitor treatment for MRSA infections.      Studies: US Venous Img Lower Unilateral Left  10/11/2014   CLINICAL DATA:  Erythema and swelling of the left lower extremity for 3 years.  EXAM: Left LOWER EXTREMITY VENOUS DOPPLER ULTRASOUND  TECHNIQUE: Gray-scale sonography with graded compression, as well as color Doppler and duplex ultrasound were performed to evaluate the lower extremity deep venous systems from the level of the common femoral vein and including the common femoral, femoral, profunda femoral, popliteal and calf veins including the posterior tibial, peroneal and gastrocnemius veins when visible. The superficial great saphenous vein was also interrogated. Spectral Doppler was utilized to evaluate flow at rest and with distal augmentation maneuvers in the common femoral, femoral and popliteal veins.  COMPARISON:  None.  FINDINGS: Contralateral Common Femoral Vein: Respiratory phasicity is normal and symmetric with the symptomatic side. No evidence of thrombus. Normal compressibility.  Common Femoral Vein: No evidence of thrombus. Normal compressibility, respiratory phasicity and response to augmentation.  Saphenofemoral Junction: No evidence of thrombus. Normal compressibility and flow on color Doppler imaging.  Profunda Femoral Vein: No evidence of thrombus. Normal compressibility and flow on color Doppler imaging.  Femoral Vein: No evidence of thrombus. Normal compressibility, respiratory phasicity and response to augmentation.  Popliteal Vein: No evidence of thrombus. Normal compressibility, respiratory phasicity and response to augmentation.  Calf Veins: Limited visibility due to body habitus and edema. Cannot confirm complete compressibility, but flow is visible on color Doppler.  Superficial Great Saphenous Vein: No evidence of thrombus. Normal compressibility and flow on color Doppler imaging.  Venous Reflux:  None.   Other Findings:  None.  IMPRESSION: No evidence of deep venous thrombosis. There are study limitations in the calf due to edema and body habitus.   Electronically Signed   By: Ellery Plunk M.D.   On: 10/11/2014 20:41   Dg Chest Portable 1 View  10/11/2014   CLINICAL DATA:  Shortness of breath.  Bilateral leg edema.  EXAM: PORTABLE CHEST 1 VIEW  COMPARISON:  02/07/2010 and 04/13/2006  FINDINGS: There are moderate bilateral pleural effusions. Slight pulmonary vascular congestion. Heart size is within normal limits. No acute osseous abnormality.  IMPRESSION: Moderate bilateral pleural effusions with slight pulmonary vascular congestion.   Electronically Signed   By: Francene Boyers M.D.   On: 10/11/2014 17:39    Scheduled Meds: . amLODipine  10 mg Oral Daily  . aspirin EC  81 mg Oral Daily  . budesonide (PULMICORT) nebulizer solution  0.25 mg Nebulization BID  . cephALEXin  250 mg Oral 3 times per day  . cloNIDine  0.1  mg Oral TID  . cloNIDine  0.2 mg Oral Once  . divalproex  1,000 mg Oral QHS  . furosemide  60 mg Intravenous BID  . heparin  5,000 Units Subcutaneous 3 times per day  . hydrALAZINE  25 mg Oral 3 times per day  . insulin aspart  0-5 Units Subcutaneous QHS  . insulin aspart  0-9 Units Subcutaneous TID WC  . insulin detemir  17 Units Subcutaneous QHS  . [START ON 10/14/2014] insulin detemir  23 Units Subcutaneous Daily  . ipratropium-albuterol  3 mL Nebulization Q6H  . metoprolol  200 mg Oral Daily  . sertraline  25 mg Oral Daily  . simvastatin  20 mg Oral QHS  . sodium chloride  3 mL Intravenous Q12H    Assessment/Plan:  1. Acute respiratory failure with hypoxia- patient initially required BiPAP on presentation. Now he is down to nasal cannula. Patient still requiring 3 L of oxygen. 2. Acute on chronic diastolic congestive heart failure, anasarca- switch back to IV Lasix diuresis. Patient is on metoprolol 3. Accelerated hypertension- continue his usual medications. I will  give a stat dose of clonidine 0.2 mg and increased to 3 times a day on clonidine. Would like better blood pressure control prior to discharge. 4. Chronic kidney disease stage III with hyperkalemia. Hyperkalemia worse today. Given dose of Kayexalate and calcium chloride, insulin, D50 and bicarbonate. Recheck BMP later today and tomorrow. 5. History of COPD- nebulizers, add budesonide nebulizer 6. Diabetes mellitus- restart Levemir and sliding scale 7. Cellulitis left lower extremity- oral Keflex  Code Status:     Code Status Orders        Start     Ordered   10/11/14 2348  Full code   Continuous     10/11/14 2347    Advance Directive Documentation        Most Recent Value   Type of Advance Directive  Healthcare Power of Attorney   Pre-existing out of facility DNR order (yellow form or pink MOST form)     "MOST" Form in Place?       Family Communication: Family yesterday Disposition Plan: Back to rehabilitation soon  Consultants:  Cardiology  Time spent: 25 minutes  Alford Highland  [facility] Hospital[facility]

## 2014-10-13 NOTE — Progress Notes (Signed)
Dr. Renae Gloss is aware of patient's blood pressure this AM and currently. Daily meds were given early this AM and pressure remained elevated in the 200's. MD has ordered a second PO clonidine and states to give it and recheck before giving IV hydralazine again. Will continue to assess and monitor.

## 2014-10-13 NOTE — Progress Notes (Signed)
Notified MD that patient's blood pressure is still 196/81. IV hydralazine did not seem to help BP after night shift gave it before shift change. MD is to look at orders and re-evaluate. Will follow up.

## 2014-10-14 LAB — BASIC METABOLIC PANEL
Anion gap: 6 (ref 5–15)
BUN: 46 mg/dL — AB (ref 6–20)
CALCIUM: 9 mg/dL (ref 8.9–10.3)
CO2: 36 mmol/L — ABNORMAL HIGH (ref 22–32)
Chloride: 96 mmol/L — ABNORMAL LOW (ref 101–111)
Creatinine, Ser: 1.31 mg/dL — ABNORMAL HIGH (ref 0.61–1.24)
GFR calc Af Amer: 58 mL/min — ABNORMAL LOW (ref >60)
GFR, EST NON AFRICAN AMERICAN: 50 mL/min — AB (ref >60)
GLUCOSE: 231 mg/dL — AB (ref 65–99)
POTASSIUM: 5 mmol/L (ref 3.5–5.1)
Sodium: 138 mmol/L (ref 135–145)

## 2014-10-14 LAB — GLUCOSE, CAPILLARY
GLUCOSE-CAPILLARY: 290 mg/dL — AB (ref 65–99)
Glucose-Capillary: 160 mg/dL — ABNORMAL HIGH (ref 65–99)
Glucose-Capillary: 318 mg/dL — ABNORMAL HIGH (ref 65–99)
Glucose-Capillary: 258 mg/dL — ABNORMAL HIGH (ref 65–99)

## 2014-10-14 MED ORDER — INSULIN ASPART 100 UNIT/ML ~~LOC~~ SOLN
3.0000 [IU] | Freq: Three times a day (TID) | SUBCUTANEOUS | Status: DC
  Administered 2014-10-14 – 2014-10-17 (×8): 3 [IU] via SUBCUTANEOUS
  Filled 2014-10-14 (×8): qty 3

## 2014-10-14 MED ORDER — CLONIDINE HCL 0.1 MG PO TABS
0.2000 mg | ORAL_TABLET | Freq: Two times a day (BID) | ORAL | Status: DC
  Administered 2014-10-14 – 2014-10-17 (×6): 0.2 mg via ORAL
  Filled 2014-10-14 (×6): qty 2

## 2014-10-14 MED ORDER — CEPHALEXIN 500 MG PO CAPS
500.0000 mg | ORAL_CAPSULE | Freq: Three times a day (TID) | ORAL | Status: DC
Start: 2014-10-14 — End: 2014-10-17
  Administered 2014-10-14 – 2014-10-17 (×9): 500 mg via ORAL
  Filled 2014-10-14 (×9): qty 1

## 2014-10-14 MED ORDER — INSULIN DETEMIR 100 UNIT/ML ~~LOC~~ SOLN
27.0000 [IU] | Freq: Every day | SUBCUTANEOUS | Status: DC
  Administered 2014-10-15 – 2014-10-17 (×3): 27 [IU] via SUBCUTANEOUS
  Filled 2014-10-14 (×3): qty 0.27

## 2014-10-14 MED ORDER — INSULIN DETEMIR 100 UNIT/ML ~~LOC~~ SOLN
20.0000 [IU] | Freq: Every day | SUBCUTANEOUS | Status: DC
  Administered 2014-10-14 – 2014-10-16 (×3): 20 [IU] via SUBCUTANEOUS
  Filled 2014-10-14 (×4): qty 0.2

## 2014-10-14 NOTE — Care Management Important Message (Signed)
Important Message  Patient Details  Name: Billy Liu MRN: [id number] Date of Birth: 1932-03-31   Medicare Important Message Given:  Yes-second notification given    Collie Siad, RN 10/14/2014, 8:55 AM

## 2014-10-14 NOTE — Progress Notes (Signed)
Patient ID: Hilbert Briggs, male   DOB: 1932/12/18, 81 y.o.   MRN: [id number] [facility] PROGRESS NOTE  PCP: No primary care provider on file.  HPI/Subjective: Patient feeling a little better today than yesterday. Still some shortness of breath and swelling of his lower extremities.  Objective: Filed Vitals:   10/14/14 1249  BP: 174/60  Pulse: 65  Temp:   Resp:     Filed Weights   10/12/14 0546 10/13/14 0445 10/14/14 0557  Weight: 135.172 kg (298 lb) 134.945 kg (297 lb 8 oz) 134.945 kg (297 lb 8 oz)    ROS: Review of Systems  Constitutional: Negative for fever and chills.  Eyes: Negative for blurred vision.  Respiratory: Positive for shortness of breath. Negative for cough.   Cardiovascular: Negative for chest pain.  Gastrointestinal: Negative for nausea, vomiting, abdominal pain, diarrhea and constipation.  Genitourinary: Negative for dysuria.  Musculoskeletal: Negative for joint pain.  Neurological: Negative for dizziness and headaches.   Exam: Physical Exam  Constitutional: He is oriented to person, place, and time.  HENT:  Nose: No mucosal edema.  Mouth/Throat: No oropharyngeal exudate or posterior oropharyngeal edema.  Eyes: Conjunctivae, EOM and lids are normal. Pupils are equal, round, and reactive to light.  Neck: JVD present. Carotid bruit is not present. No edema present. No thyroid mass and no thyromegaly present.  Cardiovascular: S1 normal and S2 normal.  Exam reveals no gallop.   No murmur heard. Pulses:      Dorsalis pedis pulses are 1+ on the right side, and 1+ on the left side.  Respiratory: No respiratory distress. He has no wheezes. He has no rhonchi. He has rales in the right lower field and the left lower field.  GI: Soft. Bowel sounds are normal. There is no tenderness.  Musculoskeletal:       Right ankle: He exhibits swelling.       Left ankle: He exhibits swelling.  Lymphadenopathy:    He has no cervical adenopathy.   Neurological: He is alert and oriented to person, place, and time.  Slurred speech  Skin: Skin is warm. No rash noted. Nails show no clubbing.  Erythema left lower extremity with warmth. Chronic lower extremity discoloration bilaterally  Psychiatric: He has a normal mood and affect.    Data Reviewed: Basic Metabolic Panel:  Recent Labs Lab 10/11/14 1746 10/12/14 0002 10/12/14 0554 10/13/14 0432 10/13/14 1402 10/14/14 0443  NA 136  --  136 137 136 138  K 5.1  --  5.5* 5.7* 5.6* 5.0  CL 98*  --  101 99* 95* 96*  CO2 31  --  29 33* 35* 36*  GLUCOSE 64*  --  167* 171* 250* 231*  BUN 53*  --  52* 51* 45* 46*  CREATININE 1.44* 1.37* 1.37* 1.47* 1.32* 1.31*  CALCIUM 9.2  --  9.0 9.0 9.2 9.0   Liver Function Tests:  Recent Labs Lab 10/11/14 1746  AST 25  ALT 29  ALKPHOS 71  BILITOT 0.2*  PROT 7.5  ALBUMIN 4.1   CBC:  Recent Labs Lab 10/10/14 2030 10/11/14 1746 10/12/14 0002 10/12/14 0554  WBC 6.3 4.9 6.8 6.6  NEUTROABS 4.8 3.5  --   --   HGB 9.7* 10.1* 9.4* 9.6*  HCT 30.4* 31.3* 29.3* 30.0*  MCV 88.2 87.4 87.5 87.5  PLT 166 168 169 167   Cardiac Enzymes:  Recent Labs Lab 10/11/14 1746 10/12/14 0002 10/12/14 0554 10/12/14 1227  TROPONINI <0.03 <0.03 <0.03 <0.03  BNP (last 3 results)  Recent Labs  10/11/14 1746  BNP 568.0*    CBG:  Recent Labs Lab 10/13/14 1150 10/13/14 1634 10/13/14 2001 10/14/14 0741 10/14/14 1142  GLUCAP 218* 197* 295* 160* 318*    Recent Results (from the past 240 hour(s))  MRSA PCR Screening     Status: None   Collection Time: 10/12/14  5:25 AM  Result Value Ref Range Status   MRSA by PCR NEGATIVE NEGATIVE Final    Comment:        The GeneXpert MRSA Assay (FDA approved for NASAL specimens only), is one component of a comprehensive MRSA colonization surveillance program. It is not intended to diagnose MRSA infection nor to guide or monitor treatment for MRSA infections.      Studies: No results  found.  Scheduled Meds: . amLODipine  10 mg Oral Daily  . aspirin EC  81 mg Oral Daily  . budesonide (PULMICORT) nebulizer solution  0.25 mg Nebulization BID  . cephALEXin  250 mg Oral 3 times per day  . cloNIDine  0.1 mg Oral TID  . divalproex  1,000 mg Oral QHS  . furosemide  60 mg Intravenous BID  . heparin  5,000 Units Subcutaneous 3 times per day  . hydrALAZINE  50 mg Oral 4 times per day  . insulin aspart  0-5 Units Subcutaneous QHS  . insulin aspart  0-9 Units Subcutaneous TID WC  . insulin aspart  3 Units Subcutaneous TID WC  . insulin detemir  20 Units Subcutaneous QHS  . [START ON 10/15/2014] insulin detemir  27 Units Subcutaneous Daily  . ipratropium-albuterol  3 mL Nebulization Q6H  . metoprolol  200 mg Oral Daily  . sertraline  25 mg Oral Daily  . simvastatin  20 mg Oral QHS  . sodium chloride  3 mL Intravenous Q12H    Assessment/Plan:  1. Acute respiratory failure with hypoxia- patient initially required BiPAP on presentation. Now he is down to nasal cannula. Pulse ox was 78% on room air. Patient needs continued oxygen supplementation. 2. Acute on chronic diastolic congestive heart failure- continue IV Lasix diuresis. Patient is on metoprolol and lisinopril. 3. Accelerated hypertension- will try to titrate medications to get better blood pressure control 4. Chronic kidney disease stage III with hyperkalemia. 5. History of COPD- start nebulizers 6. Diabetes mellitus- sliding scale 7. Cellulitis of the left lower extremity- oral Keflex  Code Status:     Code Status Orders        Start     Ordered   10/11/14 2348  Full code   Continuous     10/11/14 2347    Advance Directive Documentation        Most Recent Value   Type of Advance Directive  Healthcare Power of Attorney   Pre-existing out of facility DNR order (yellow form or pink MOST form)     "MOST" Form in Place?       Family Communication: Left message for daughter on phone Disposition Plan: Back to  rehabilitation soon  Consultants:  Cardiology  Time spent: 24 minutes  Alford Highland  [facility] Hospital[facility]

## 2014-10-14 NOTE — Progress Notes (Signed)
Updated Dr. Renae Gloss on patients O2 saturations, pt 79% on room air at rest and 93% on 3.5L. Per Dr. Renae Gloss, keep patient on 3L and sats >/= 88%.

## 2014-10-14 NOTE — Progress Notes (Addendum)
Inpatient Diabetes Program Recommendations  AACE/ADA: New Consensus Statement on Inpatient Glycemic Control (2015)  Target Ranges:  Prepandial:   less than 140 mg/dL      Peak postprandial:   less than 180 mg/dL (1-2 hours)      Critically ill patients:  140 - 180 mg/dL   Review of Glycemic Control  Results for Billy Liu, Billy Liu (MRN [id number]) as of 10/14/2014 08:34  Ref. Range 10/13/2014 07:32 10/13/2014 11:50 10/13/2014 16:34 10/13/2014 20:01 10/14/2014 07:41  Glucose-Capillary Latest Ref Range: 65-99 mg/dL 119 (H) 147 (H) 829 (H) 295 (H) 160 (H)   Diabetes history: Type 2 diabetes Outpatient Diabetes medications: Levemir 46 units q AM and 34 units q PM, Humalog 12 units tid with meals Current orders for Inpatient glycemic control: Novolog sensitive tid with meals and HS, Levemir 23 units q AM and 17 units q PM.  Inpatient Diabetes Program Recommendations:   Fasting blood sugars much improved since Lantus was re-introduced.  Please consider adding Novolog 3 units tid with meals (hold if patient eats less than 50%)- as post prandial blood sugars remain elevated despite the current order for Novolog correction.  Susette Racer, RN, BA, MHA, CDE Diabetes Coordinator Inpatient Diabetes Program  [phone] (Team Pager) [phone] [facility] Office) 10/14/2014 8:35 AM

## 2014-10-14 NOTE — Progress Notes (Signed)
Attempted to call update to [facility].  No answer.  Will notify day shift nurse to follow up.   Cristela Felt, RN

## 2014-10-14 NOTE — Progress Notes (Signed)
SATURATION QUALIFICATIONS: (This note is used to comply with regulatory documentation for home oxygen)  Patient Saturations on Room Air at Rest = 79%  Patient Saturations on Room Air while Ambulating = not preformed, patient unable to ambulate and sats too low regardless   Patient Saturations on 3 Liters of oxygen at rest = 94% (patient is wheelchair bound and does not ambulate at baseline)  Please briefly explain why patient needs home oxygen:

## 2014-10-15 LAB — GLUCOSE, CAPILLARY
GLUCOSE-CAPILLARY: 103 mg/dL — AB (ref 65–99)
GLUCOSE-CAPILLARY: 184 mg/dL — AB (ref 65–99)
Glucose-Capillary: 160 mg/dL — ABNORMAL HIGH (ref 65–99)
Glucose-Capillary: 135 mg/dL — ABNORMAL HIGH (ref 65–99)

## 2014-10-15 MED ORDER — DOCUSATE SODIUM 100 MG PO CAPS
100.0000 mg | ORAL_CAPSULE | Freq: Two times a day (BID) | ORAL | Status: DC
  Administered 2014-10-15 – 2014-10-17 (×5): 100 mg via ORAL
  Filled 2014-10-15 (×5): qty 1

## 2014-10-15 MED ORDER — BISACODYL 5 MG PO TBEC
10.0000 mg | DELAYED_RELEASE_TABLET | Freq: Every day | ORAL | Status: DC | PRN
  Administered 2014-10-16: 10 mg via ORAL
  Filled 2014-10-15: qty 2

## 2014-10-15 MED ORDER — HYDRALAZINE HCL 50 MG PO TABS
75.0000 mg | ORAL_TABLET | Freq: Four times a day (QID) | ORAL | Status: DC
  Administered 2014-10-15 – 2014-10-17 (×8): 75 mg via ORAL
  Filled 2014-10-15 (×8): qty 1

## 2014-10-15 NOTE — Progress Notes (Signed)
Spoke with dr. Imogene Burn to make aware patient has not had a bowel movement since the 4th of oct. Per md will look at patients chart and will order medication as needed [employer]

## 2014-10-15 NOTE — Progress Notes (Addendum)
Patient ID: Billy Liu, male   DOB: 1932-02-22, 81 y.o.   MRN: [id number] [facility] PROGRESS NOTE  PCP: No primary care provider on file.  HPI/Subjective: No complaint. On oxygen by nasal cannular 3 L today.  Objective: Filed Vitals:   10/15/14 1120  BP: 161/67  Pulse: 63  Temp: 98 F (36.7 C)  Resp: 18    Filed Weights   10/13/14 0445 10/14/14 0557 10/15/14 0600  Weight: 134.945 kg (297 lb 8 oz) 134.945 kg (297 lb 8 oz) 129.457 kg (285 lb 6.4 oz)    ROS: Review of Systems  Constitutional: Negative for fever and chills.  Eyes: Negative for blurred vision.  Respiratory: Negative for cough and shortness of breath.   Cardiovascular: Negative for chest pain.  Gastrointestinal: Negative for nausea, vomiting, abdominal pain, diarrhea and constipation.  Genitourinary: Negative for dysuria.  Musculoskeletal: Negative for joint pain.  Neurological: Negative for dizziness and headaches.   Exam: Physical Exam  Constitutional: He is oriented to person, place, and time.  HENT:  Nose: No mucosal edema.  Mouth/Throat: No oropharyngeal exudate or posterior oropharyngeal edema.  Eyes: Conjunctivae, EOM and lids are normal. Pupils are equal, round, and reactive to light.  Neck: No JVD present. Carotid bruit is not present. No edema present. No thyroid mass and no thyromegaly present.  Cardiovascular: S1 normal and S2 normal.  Exam reveals no gallop.   No murmur heard. Pulses:      Dorsalis pedis pulses are 1+ on the right side, and 1+ on the left side.  Respiratory: No respiratory distress. He has no wheezes. He has no rhonchi. He has rales in the right lower field and the left lower field.  GI: Soft. Bowel sounds are normal. There is no tenderness.  Musculoskeletal:       Right ankle: He exhibits swelling.       Left ankle: He exhibits swelling.  Lymphadenopathy:    He has no cervical adenopathy.  Neurological: He is alert and oriented to person, place, and  time.  Slurred speech  Skin: Skin is warm. No rash noted. No erythema. Nails show no clubbing.  Erythema left lower extremity with warmth. Chronic lower extremity discoloration bilaterally  Psychiatric: He has a normal mood and affect.    Data Reviewed: Basic Metabolic Panel:  Recent Labs Lab 10/11/14 1746 10/12/14 0002 10/12/14 0554 10/13/14 0432 10/13/14 1402 10/14/14 0443  NA 136  --  136 137 136 138  K 5.1  --  5.5* 5.7* 5.6* 5.0  CL 98*  --  101 99* 95* 96*  CO2 31  --  29 33* 35* 36*  GLUCOSE 64*  --  167* 171* 250* 231*  BUN 53*  --  52* 51* 45* 46*  CREATININE 1.44* 1.37* 1.37* 1.47* 1.32* 1.31*  CALCIUM 9.2  --  9.0 9.0 9.2 9.0   Liver Function Tests:  Recent Labs Lab 10/11/14 1746  AST 25  ALT 29  ALKPHOS 71  BILITOT 0.2*  PROT 7.5  ALBUMIN 4.1   CBC:  Recent Labs Lab 10/10/14 2030 10/11/14 1746 10/12/14 0002 10/12/14 0554  WBC 6.3 4.9 6.8 6.6  NEUTROABS 4.8 3.5  --   --   HGB 9.7* 10.1* 9.4* 9.6*  HCT 30.4* 31.3* 29.3* 30.0*  MCV 88.2 87.4 87.5 87.5  PLT 166 168 169 167   Cardiac Enzymes:  Recent Labs Lab 10/11/14 1746 10/12/14 0002 10/12/14 0554 10/12/14 1227  TROPONINI <0.03 <0.03 <0.03 <0.03   BNP (last  3 results)  Recent Labs  10/11/14 1746  BNP 568.0*    CBG:  Recent Labs Lab 10/14/14 1142 10/14/14 1627 10/14/14 2110 10/15/14 0746 10/15/14 1148  GLUCAP 318* 258* 290* 103* 160*    Recent Results (from the past 240 hour(s))  MRSA PCR Screening     Status: None   Collection Time: 10/12/14  5:25 AM  Result Value Ref Range Status   MRSA by PCR NEGATIVE NEGATIVE Final    Comment:        The GeneXpert MRSA Assay (FDA approved for NASAL specimens only), is one component of a comprehensive MRSA colonization surveillance program. It is not intended to diagnose MRSA infection nor to guide or monitor treatment for MRSA infections.      Studies: No results found.  Scheduled Meds: . amLODipine  10 mg Oral  Daily  . aspirin EC  81 mg Oral Daily  . budesonide (PULMICORT) nebulizer solution  0.25 mg Nebulization BID  . cephALEXin  500 mg Oral 3 times per day  . cloNIDine  0.2 mg Oral BID  . divalproex  1,000 mg Oral QHS  . furosemide  60 mg Intravenous BID  . heparin  5,000 Units Subcutaneous 3 times per day  . hydrALAZINE  50 mg Oral 4 times per day  . insulin aspart  0-5 Units Subcutaneous QHS  . insulin aspart  0-9 Units Subcutaneous TID WC  . insulin aspart  3 Units Subcutaneous TID WC  . insulin detemir  20 Units Subcutaneous QHS  . insulin detemir  27 Units Subcutaneous Daily  . ipratropium-albuterol  3 mL Nebulization Q6H  . metoprolol  200 mg Oral Daily  . sertraline  25 mg Oral Daily  . simvastatin  20 mg Oral QHS  . sodium chloride  3 mL Intravenous Q12H    Assessment/Plan:  1. Acute respiratory failure with hypoxia- patient initially required BiPAP on presentation. Now he is down to nasal cannula 3 L. try to wean off oxygen. 2. Acute on chronic diastolic congestive heart failure- continue IV Lasix 60 mg bid. Continue metoprolol and lisinopril. 3. Malignant hypertension- BP 202/77 this am. continue Norvasc, clonidine,  Lopressor and Lasix. Increase Hydralazine to 75 mg qid.  IV hydralazine when necessary. 4. Chronic kidney disease stage III with hyperkalemia. Renal function is stable. Hyperkalemia improved.  5. History of COPD- start nebulizers 6. Diabetes mellitus- continue Levemir and sliding scale 7. Cellulitis of the left lower extremity- oral Keflex  Code Status:     Code Status Orders        Start     Ordered   10/11/14 2348  Full code   Continuous     10/11/14 2347    Advance Directive Documentation        Most Recent Value   Type of Advance Directive  Healthcare Power of Attorney   Pre-existing out of facility DNR order (yellow form or pink MOST form)     "MOST" Form in Place?       Family Communication: Disposition Plan: Possible discharge to skilled  nursing facility in 3 days.  Consultants:  Cardiology  Time spent: 24 minutes  Shaune Pollack  [facility]

## 2014-10-15 NOTE — Progress Notes (Signed)
   10/15/14 1040  Clinical Encounter Type  Visited With Patient  Visit Type Follow-up  Consult/Referral To Chaplain  Spiritual Encounters  Spiritual Needs Prayer;Sacred text  Stress Factors  Patient Stress Factors Exhausted;Health changes  Visited w/patient to provide pastoral care & prayer. Read Scripture. Chap. Rolla Kedzierski [employer], ext. [phone]

## 2014-10-16 LAB — BASIC METABOLIC PANEL
ANION GAP: 5 (ref 5–15)
BUN: 48 mg/dL — AB (ref 6–20)
CHLORIDE: 96 mmol/L — AB (ref 101–111)
CO2: 36 mmol/L — AB (ref 22–32)
Calcium: 9 mg/dL (ref 8.9–10.3)
Creatinine, Ser: 1.35 mg/dL — ABNORMAL HIGH (ref 0.61–1.24)
GFR calc Af Amer: 56 mL/min — ABNORMAL LOW (ref >60)
GFR, EST NON AFRICAN AMERICAN: 48 mL/min — AB (ref >60)
GLUCOSE: 77 mg/dL (ref 65–99)
POTASSIUM: 4.9 mmol/L (ref 3.5–5.1)
Sodium: 137 mmol/L (ref 135–145)

## 2014-10-16 LAB — CBC
HEMATOCRIT: 30 % — AB (ref 40.0–52.0)
HEMOGLOBIN: 9.8 g/dL — AB (ref 13.0–18.0)
MCH: 28.7 pg (ref 26.0–34.0)
MCHC: 32.7 g/dL (ref 32.0–36.0)
MCV: 87.9 fL (ref 80.0–100.0)
Platelets: 167 10*3/uL (ref 150–440)
RBC: 3.41 MIL/uL — AB (ref 4.40–5.90)
RDW: 16.2 % — ABNORMAL HIGH (ref 11.5–14.5)
WBC: 4.3 10*3/uL (ref 3.8–10.6)

## 2014-10-16 LAB — GLUCOSE, CAPILLARY
GLUCOSE-CAPILLARY: 131 mg/dL — AB (ref 65–99)
GLUCOSE-CAPILLARY: 147 mg/dL — AB (ref 65–99)
GLUCOSE-CAPILLARY: 124 mg/dL — AB (ref 65–99)
Glucose-Capillary: 146 mg/dL — ABNORMAL HIGH (ref 65–99)

## 2014-10-16 LAB — MAGNESIUM: Magnesium: 2.2 mg/dL (ref 1.7–2.4)

## 2014-10-16 MED ORDER — FLEET ENEMA 7-19 GM/118ML RE ENEM
1.0000 | ENEMA | Freq: Once | RECTAL | Status: AC
  Administered 2014-10-16: 1 via RECTAL

## 2014-10-16 MED ORDER — IPRATROPIUM-ALBUTEROL 0.5-2.5 (3) MG/3ML IN SOLN: 3.0000 mL | Freq: Four times a day (QID) | RESPIRATORY_TRACT | Status: DC | PRN

## 2014-10-16 NOTE — Progress Notes (Signed)
Patient ID: Billy Liu, male   DOB: 06/08/1932, 81 y.o.   MRN: [id number] [facility] PROGRESS NOTE  PCP: No primary care provider on file.  HPI/Subjective: Mild shortness of breath, On oxygen by nasal cannular 2 L today.  Objective: Filed Vitals:   10/16/14 1108  BP: 137/56  Pulse: 57  Temp: 97.3 F (36.3 C)  Resp: 15    Filed Weights   10/14/14 0557 10/15/14 0600 10/16/14 0429  Weight: 134.945 kg (297 lb 8 oz) 129.457 kg (285 lb 6.4 oz) 128.413 kg (283 lb 1.6 oz)    ROS: Review of Systems  Constitutional: Negative for fever and chills.  Eyes: Negative for blurred vision.  Respiratory: Positive for shortness of breath. Negative for cough.   Cardiovascular: Negative for chest pain.  Gastrointestinal: Negative for nausea, vomiting, abdominal pain, diarrhea and constipation.  Genitourinary: Negative for dysuria.  Musculoskeletal: Negative for joint pain.  Neurological: Negative for dizziness and headaches.   Exam: Physical Exam  Constitutional: He is oriented to person, place, and time.  HENT:  Nose: No mucosal edema.  Mouth/Throat: No oropharyngeal exudate or posterior oropharyngeal edema.  Eyes: Conjunctivae, EOM and lids are normal. Pupils are equal, round, and reactive to light.  Neck: No JVD present. Carotid bruit is not present. No edema present. No thyroid mass and no thyromegaly present.  Cardiovascular: S1 normal and S2 normal.  Exam reveals no gallop.   No murmur heard. Pulses:      Dorsalis pedis pulses are 1+ on the right side, and 1+ on the left side.  Respiratory: No respiratory distress. He has no wheezes. He has no rhonchi. He has no rales.  GI: Soft. Bowel sounds are normal. There is no tenderness.  Musculoskeletal:       Right ankle: He exhibits swelling.       Left ankle: He exhibits swelling.  Lymphadenopathy:    He has no cervical adenopathy.  Neurological: He is alert and oriented to person, place, and time.  Slurred speech   Skin: Skin is warm. No rash noted. No erythema. Nails show no clubbing.  Erythema left lower extremity with warmth. Chronic lower extremity discoloration bilaterally  Psychiatric: He has a normal mood and affect.    Data Reviewed: Basic Metabolic Panel:  Recent Labs Lab 10/12/14 0554 10/13/14 0432 10/13/14 1402 10/14/14 0443 10/16/14 0557  NA 136 137 136 138 137  K 5.5* 5.7* 5.6* 5.0 4.9  CL 101 99* 95* 96* 96*  CO2 29 33* 35* 36* 36*  GLUCOSE 167* 171* 250* 231* 77  BUN 52* 51* 45* 46* 48*  CREATININE 1.37* 1.47* 1.32* 1.31* 1.35*  CALCIUM 9.0 9.0 9.2 9.0 9.0  MG  --   --   --   --  2.2   Liver Function Tests:  Recent Labs Lab 10/11/14 1746  AST 25  ALT 29  ALKPHOS 71  BILITOT 0.2*  PROT 7.5  ALBUMIN 4.1   CBC:  Recent Labs Lab 10/10/14 2030 10/11/14 1746 10/12/14 0002 10/12/14 0554 10/16/14 0557  WBC 6.3 4.9 6.8 6.6 4.3  NEUTROABS 4.8 3.5  --   --   --   HGB 9.7* 10.1* 9.4* 9.6* 9.8*  HCT 30.4* 31.3* 29.3* 30.0* 30.0*  MCV 88.2 87.4 87.5 87.5 87.9  PLT 166 168 169 167 167   Cardiac Enzymes:  Recent Labs Lab 10/11/14 1746 10/12/14 0002 10/12/14 0554 10/12/14 1227  TROPONINI <0.03 <0.03 <0.03 <0.03   BNP (last 3 results)  Recent Labs  10/11/14 1746  BNP 568.0*    CBG:  Recent Labs Lab 10/15/14 1148 10/15/14 1634 10/15/14 1958 10/16/14 0721 10/16/14 1110  GLUCAP 160* 135* 184* 146* 131*    Recent Results (from the past 240 hour(s))  MRSA PCR Screening     Status: None   Collection Time: 10/12/14  5:25 AM  Result Value Ref Range Status   MRSA by PCR NEGATIVE NEGATIVE Final    Comment:        The GeneXpert MRSA Assay (FDA approved for NASAL specimens only), is one component of a comprehensive MRSA colonization surveillance program. It is not intended to diagnose MRSA infection nor to guide or monitor treatment for MRSA infections.      Studies: No results found.  Scheduled Meds: . amLODipine  10 mg Oral Daily  .  aspirin EC  81 mg Oral Daily  . budesonide (PULMICORT) nebulizer solution  0.25 mg Nebulization BID  . cephALEXin  500 mg Oral 3 times per day  . cloNIDine  0.2 mg Oral BID  . divalproex  1,000 mg Oral QHS  . docusate sodium  100 mg Oral BID  . furosemide  60 mg Intravenous BID  . heparin  5,000 Units Subcutaneous 3 times per day  . hydrALAZINE  75 mg Oral 4 times per day  . insulin aspart  0-5 Units Subcutaneous QHS  . insulin aspart  0-9 Units Subcutaneous TID WC  . insulin aspart  3 Units Subcutaneous TID WC  . insulin detemir  20 Units Subcutaneous QHS  . insulin detemir  27 Units Subcutaneous Daily  . metoprolol  200 mg Oral Daily  . sertraline  25 mg Oral Daily  . simvastatin  20 mg Oral QHS  . sodium chloride  3 mL Intravenous Q12H    Assessment/Plan:  1. Acute respiratory failure with hypoxia- patient initially required BiPAP on presentation. Now he is down to nasal cannula 2 L. try to wean off oxygen. 2. Acute on chronic diastolic congestive heart failure- continue IV Lasix 60 mg bid. Continue metoprolol and lisinopril. 3. Malignant hypertension- improved. continue Norvasc, clonidine,  Lopressor and Lasix. Increased Hydralazine to 75 mg qid.  IV hydralazine when necessary. 4. Chronic kidney disease stage III with hyperkalemia. Renal function is stable. Hyperkalemia improved.  5. History of COPD- continue nebulizers 6. Diabetes mellitus- continue Levemir and sliding scale 7. Cellulitis of the left lower extremity- oral Keflex  Code Status:     Code Status Orders        Start     Ordered   10/11/14 2348  Full code   Continuous     10/11/14 2347    Advance Directive Documentation        Most Recent Value   Type of Advance Directive  Healthcare Power of Attorney   Pre-existing out of facility DNR order (yellow form or pink MOST form)     "MOST" Form in Place?       Family Communication: I called Davin Archuletta at [phone], but no one answered the  phone. Disposition Plan: Possible discharge to skilled nursing facility in 2 days.  Consultants:  Cardiology  Time spent: 24 minutes  Shaune Pollack  [facility]

## 2014-10-17 DIAGNOSIS — L0390 Cellulitis, unspecified: Secondary | ICD-10-CM

## 2014-10-17 HISTORY — DX: Cellulitis, unspecified: L03.90

## 2014-10-17 LAB — GLUCOSE, CAPILLARY
GLUCOSE-CAPILLARY: 95 mg/dL (ref 65–99)
Glucose-Capillary: 143 mg/dL — ABNORMAL HIGH (ref 65–99)

## 2014-10-17 MED ORDER — CEPHALEXIN 500 MG PO CAPS: 500.0000 mg | ORAL_CAPSULE | Freq: Three times a day (TID) | ORAL | Status: DC

## 2014-10-17 MED ORDER — HYDRALAZINE HCL 25 MG PO TABS: 75.0000 mg | ORAL_TABLET | Freq: Four times a day (QID) | ORAL | Status: DC

## 2014-10-17 NOTE — Progress Notes (Signed)
Report called to Ezra Sites, RN at [facility].

## 2014-10-17 NOTE — Progress Notes (Signed)
CSW was notified by MD that [facility].. CSW faxed dc info to SNF and prepared dc packet. CSW notified Pt's brother of dc plan. Pt in agreement with return to [facility]. RN To call report and EMS for transport.   No further CSW needs at this time.   Wilford Grist, LCSW [phone]

## 2014-10-17 NOTE — Discharge Summary (Signed)
[facility] [place] at [facility]   PATIENT NAME: Billy Liu    MR#:  [id number]  DATE OF BIRTH:  [birth year]  DATE OF ADMISSION:  10/11/2014 ADMITTING PHYSICIAN: [name], MD  DATE OF DISCHARGE: 10/17/2014 PRIMARY CARE PHYSICIAN: No primary care provider on file.    ADMISSION DIAGNOSIS:  Cellulitis of left lower extremity [L03.116] Acute congestive heart failure, unspecified congestive heart failure type (HCC) [I50.9]   DISCHARGE DIAGNOSIS:  Acute respiratory failure with hypoxia Acute on chronic diastolic congestive heart failure Malignant hypertension  SECONDARY DIAGNOSIS:   Past Medical History  Diagnosis Date  . Chronic diastolic CHF (congestive heart failure) (HCC)   . COPD (chronic obstructive pulmonary disease) (HCC)   . Coronary artery disease   . Diabetes mellitus without complication (HCC)   . Hypertension   . Renal disorder   . GERD (gastroesophageal reflux disease)   . Hyperlipemia   . Anxiety   . Obesity   . CKD (chronic kidney disease), stage III     HOSPITAL COURSE:   1. Acute respiratory failure with hypoxia- patient initially required BiPAP on presentation. Then he is down on nasal cannula 1 L. try to wean off oxygen. 2. Acute on chronic diastolic congestive heart failure- he has been treated with IV Lasix 60 mg bid,  metoprolol and lisinopril. His symptoms has much improved. I will resume the patient's home medication torsemide after discharge. 3. Malignant hypertension- improved after treatment of Norvasc, clonidine, Lopressor and Lasix.  Hydralazine was increased to 75 mg qid. IV hydralazine when necessary. 4. Chronic kidney disease stage III with hyperkalemia. Renal function is stable. Hyperkalemia improved.  5. History of COPD- continue nebulizers 6. Diabetes mellitus- continue Levemir and sliding scale 7. Cellulitis of the left lower extremity. He has been on oral Keflex, cellulitis is better. I will continue  for 7 more days.  DISCHARGE CONDITIONS:   Stable, discharge to skilled nursing facility today.  CONSULTS OBTAINED:  Treatment Team:  Lamar Blinks, MD Shaune Pollack, MD  DRUG ALLERGIES:   Allergies  Allergen Reactions  . Penicillins Other (See Comments)    Reaction:  Unknown     DISCHARGE MEDICATIONS:   Current Discharge Medication List    START taking these medications   Details  cephALEXin (KEFLEX) 500 MG capsule Take 1 capsule (500 mg total) by mouth every 8 (eight) hours. Qty: 21 capsule, Refills: 0      CONTINUE these medications which have CHANGED   Details  hydrALAZINE (APRESOLINE) 25 MG tablet Take 3 tablets (75 mg total) by mouth every 6 (six) hours. Qty: 90 tablet, Refills: 0      CONTINUE these medications which have NOT CHANGED   Details  acetaminophen (TYLENOL) 500 MG tablet Take 500 mg by mouth 3 (three) times daily.    amLODipine (NORVASC) 10 MG tablet Take 10 mg by mouth daily.    aspirin EC 81 MG tablet Take 81 mg by mouth daily.    capsaicin (ZOSTRIX) 0.025 % cream Apply 1 application topically 3 (three) times daily. Pt applies to both lower legs and soles of feet.    cloNIDine (CATAPRES) 0.1 MG tablet Take 0.1 mg by mouth 2 (two) times daily.    divalproex (DEPAKOTE ER) 500 MG 24 hr tablet Take 1,000 mg by mouth at bedtime.    gabapentin (NEURONTIN) 100 MG capsule Take 100 mg by mouth 2 (two) times daily.    insulin detemir (LEVEMIR) 100 UNIT/ML injection Inject 34-46 Units into  the skin 2 (two) times daily. Pt uses 46 units in the morning and 34 units in the evening.    insulin lispro (HUMALOG) 100 UNIT/ML injection Inject 12 Units into the skin 2 (two) times daily with breakfast and lunch.    lisinopril (PRINIVIL,ZESTRIL) 20 MG tablet Take 20 mg by mouth 2 (two) times daily.    metoprolol (TOPROL-XL) 200 MG 24 hr tablet Take 200 mg by mouth daily.    neomycin-bacitracin-polymyxin (NEOSPORIN) [phone] ointment Apply 1 application  topically 3 (three) times daily.    potassium chloride (K-DUR) 10 MEQ tablet Take 20 mEq by mouth 2 (two) times daily.    senna (SENOKOT) 8.6 MG TABS tablet Take 2 tablets by mouth at bedtime.    sertraline (ZOLOFT) 25 MG tablet Take 25 mg by mouth daily.    simvastatin (ZOCOR) 20 MG tablet Take 20 mg by mouth at bedtime. Pt takes with a  tablet.    torsemide (DEMADEX) 20 MG tablet Take 60 mg by mouth daily.    Vitamin D, Ergocalciferol, (DRISDOL) 50000 UNITS CAPS capsule Take 50,000 Units by mouth every 30 (thirty) days. Pt takes on the 3rd of every month.    Witch Hazel (PREPARATION H EX) Apply 1 application topically as needed (for burning).         DISCHARGE INSTRUCTIONS:    If you experience worsening of your admission symptoms, develop shortness of breath, life threatening emergency, suicidal or homicidal thoughts you must seek medical attention immediately by calling 911 or calling your MD immediately  if symptoms less severe.  You Must read complete instructions/literature along with all the possible adverse reactions/side effects for all the Medicines you take and that have been prescribed to you. Take any new Medicines after you have completely understood and accept all the possible adverse reactions/side effects.   Please note  You were cared for by a hospitalist during your hospital stay. If you have any questions about your discharge medications or the care you received while you were in the hospital after you are discharged, you can call the unit and asked to speak with the hospitalist on call if the hospitalist that took care of you is not available. Once you are discharged, your primary care physician will handle any further medical issues. Please note that NO REFILLS for any discharge medications will be authorized once you are discharged, as it is imperative that you return to your primary care physician (or establish a relationship with a primary care physician if  you do not have one) for your aftercare needs so that they can reassess your need for medications and monitor your lab values.    Today   SUBJECTIVE   No complaint.   VITAL SIGNS:  Blood pressure 114/58, pulse 61, temperature 98.2 F (36.8 C), temperature source Oral, resp. rate 18, height 6' (1.829 m), weight 130.636 kg (288 lb), SpO2 95 %.  I/O:   Intake/Output Summary (Last 24 hours) at 10/17/14 1130 Last data filed at 10/17/14 1100  Gross per 24 hour  Intake    843 ml  Output   1375 ml  Net   -532 ml    PHYSICAL EXAMINATION:  GENERAL:  81 y.o.-year-old patient lying in the bed with no acute distress.  EYES: Pupils equal, round, reactive to light and accommodation. No scleral icterus. Extraocular muscles intact.  HEENT: Head atraumatic, normocephalic. Oropharynx and nasopharynx clear. Chronic slurred speech. NECK:  Supple, no jugular venous distention. No thyroid enlargement, no tenderness.  LUNGS: Normal breath sounds bilaterally, no wheezing, rales,rhonchi or crepitation. No use of accessory muscles of respiration.  CARDIOVASCULAR: S1, S2 normal. No murmurs, rubs, or gallops.  ABDOMEN: Soft, non-tender, non-distended. Bowel sounds present. No organomegaly or mass.  EXTREMITIES: No pedal edema, cyanosis, or clubbing. Chronic skin changes with mild increasing edema on bilateral lower extremities without tenderness. NEUROLOGIC: Cranial nerves II through XII are intact. Muscle strength 3-4/5 in all extremities. Sensation intact. Gait not checked.  PSYCHIATRIC: The patient is alert and oriented x 3.  SKIN: No obvious rash, lesion, or ulcer.   DATA REVIEW:   CBC  Recent Labs Lab 10/16/14 0557  WBC 4.3  HGB 9.8*  HCT 30.0*  PLT 167    Chemistries   Recent Labs Lab 10/11/14 1746  10/16/14 0557  NA 136  < > 137  K 5.1  < > 4.9  CL 98*  < > 96*  CO2 31  < > 36*  GLUCOSE 64*  < > 77  BUN 53*  < > 48*  CREATININE 1.44*  < > 1.35*  CALCIUM 9.2  < > 9.0  MG  --    --  2.2  AST 25  --   --   ALT 29  --   --   ALKPHOS 71  --   --   BILITOT 0.2*  --   --   < > = values in this interval not displayed.  Cardiac Enzymes  Recent Labs Lab 10/12/14 1227  TROPONINI <0.03    Microbiology Results  Results for orders placed or performed during the hospital encounter of 10/11/14  MRSA PCR Screening     Status: None   Collection Time: 10/12/14  5:25 AM  Result Value Ref Range Status   MRSA by PCR NEGATIVE NEGATIVE Final    Comment:        The GeneXpert MRSA Assay (FDA approved for NASAL specimens only), is one component of a comprehensive MRSA colonization surveillance program. It is not intended to diagnose MRSA infection nor to guide or monitor treatment for MRSA infections.     RADIOLOGY:  No results found.      Management plans discussed with the patient, family and they are in agreement.  CODE STATUS:     Code Status Orders        Start     Ordered   10/11/14 2348  Full code   Continuous     10/11/14 2347    Advance Directive Documentation        Most Recent Value   Type of Advance Directive  Healthcare Power of Attorney   Pre-existing out of facility DNR order (yellow form or pink MOST form)     "MOST" Form in Place?        TOTAL TIME TAKING CARE OF THIS PATIENT: 36 minutes.    Shaune Pollack M.D on 10/17/2014 at 11:30 AM  Between 7am to 6pm - Pager - [phone]  After 6pm go to [email or web address] - password [email or web address] [facility]  [facility]  Office  [phone]  CC: Primary care physician; No primary care provider on file.

## 2014-10-17 NOTE — Progress Notes (Signed)
EMS here to transfer pt to [facility].

## 2014-10-17 NOTE — Progress Notes (Signed)
Spoke with Judeth Cornfield, [facility] rep at [phone], to notify of non-emergent EMS transport.  Auth notification reference given as [id number].   Service date range good from 10/17/14 - 01/15/15.   Gap exception requested to determine if services can be considered at an in-network level.

## 2014-10-17 NOTE — Discharge Instructions (Signed)
[facility] appointment on November 01, 2014 at 11:00am with Clarisa Kindred, FNP. Please call [phone] to reschedule.   Low sodium, heart healthy and ADA diet. Activity as tolerated.

## 2014-10-17 NOTE — Progress Notes (Signed)
EMS called for non emergent transfer to [facility].

## 2014-10-24 ENCOUNTER — Inpatient Hospital Stay
Admission: EM | Admit: 2014-10-24 | Discharge: 2014-10-31 | DRG: 682 | Disposition: A | Discharge status: Skilled Nursing Facility | Payer: Medicare Other | Attending: Specialist | Admitting: Specialist

## 2014-10-24 ENCOUNTER — Encounter: Payer: Self-pay | Admitting: *Deleted

## 2014-10-24 ENCOUNTER — Emergency Department: Payer: Medicare Other

## 2014-10-24 DIAGNOSIS — Z87891 Personal history of nicotine dependence: Secondary | ICD-10-CM

## 2014-10-24 DIAGNOSIS — E1122 Type 2 diabetes mellitus with diabetic chronic kidney disease: Secondary | ICD-10-CM | POA: Diagnosis present

## 2014-10-24 DIAGNOSIS — E875 Hyperkalemia: Secondary | ICD-10-CM | POA: Diagnosis present

## 2014-10-24 DIAGNOSIS — K219 Gastro-esophageal reflux disease without esophagitis: Secondary | ICD-10-CM | POA: Diagnosis present

## 2014-10-24 DIAGNOSIS — Z794 Long term (current) use of insulin: Secondary | ICD-10-CM | POA: Diagnosis not present

## 2014-10-24 DIAGNOSIS — N5089 Other specified disorders of the male genital organs: Secondary | ICD-10-CM | POA: Diagnosis present

## 2014-10-24 DIAGNOSIS — R338 Other retention of urine: Secondary | ICD-10-CM | POA: Diagnosis present

## 2014-10-24 DIAGNOSIS — I5033 Acute on chronic diastolic (congestive) heart failure: Secondary | ICD-10-CM | POA: Diagnosis present

## 2014-10-24 DIAGNOSIS — N4883 Acquired buried penis: Secondary | ICD-10-CM | POA: Diagnosis not present

## 2014-10-24 DIAGNOSIS — R570 Cardiogenic shock: Secondary | ICD-10-CM | POA: Diagnosis present

## 2014-10-24 DIAGNOSIS — E1165 Type 2 diabetes mellitus with hyperglycemia: Secondary | ICD-10-CM | POA: Diagnosis present

## 2014-10-24 DIAGNOSIS — N433 Hydrocele, unspecified: Secondary | ICD-10-CM | POA: Diagnosis present

## 2014-10-24 DIAGNOSIS — E785 Hyperlipidemia, unspecified: Secondary | ICD-10-CM | POA: Diagnosis present

## 2014-10-24 DIAGNOSIS — Z6837 Body mass index (BMI) 37.0-37.9, adult: Secondary | ICD-10-CM

## 2014-10-24 DIAGNOSIS — F329 Major depressive disorder, single episode, unspecified: Secondary | ICD-10-CM | POA: Diagnosis present

## 2014-10-24 DIAGNOSIS — E669 Obesity, unspecified: Secondary | ICD-10-CM | POA: Diagnosis present

## 2014-10-24 DIAGNOSIS — E11649 Type 2 diabetes mellitus with hypoglycemia without coma: Secondary | ICD-10-CM | POA: Diagnosis present

## 2014-10-24 DIAGNOSIS — R601 Generalized edema: Secondary | ICD-10-CM | POA: Diagnosis present

## 2014-10-24 DIAGNOSIS — J449 Chronic obstructive pulmonary disease, unspecified: Secondary | ICD-10-CM | POA: Diagnosis present

## 2014-10-24 DIAGNOSIS — N19 Unspecified kidney failure: Secondary | ICD-10-CM

## 2014-10-24 DIAGNOSIS — I130 Hypertensive heart and chronic kidney disease with heart failure and stage 1 through stage 4 chronic kidney disease, or unspecified chronic kidney disease: Secondary | ICD-10-CM | POA: Diagnosis present

## 2014-10-24 DIAGNOSIS — Z79899 Other long term (current) drug therapy: Secondary | ICD-10-CM | POA: Diagnosis not present

## 2014-10-24 DIAGNOSIS — E860 Dehydration: Secondary | ICD-10-CM | POA: Diagnosis present

## 2014-10-24 DIAGNOSIS — Z7982 Long term (current) use of aspirin: Secondary | ICD-10-CM | POA: Diagnosis not present

## 2014-10-24 DIAGNOSIS — R339 Retention of urine, unspecified: Secondary | ICD-10-CM | POA: Diagnosis present

## 2014-10-24 DIAGNOSIS — N183 Chronic kidney disease, stage 3 (moderate): Secondary | ICD-10-CM | POA: Diagnosis present

## 2014-10-24 DIAGNOSIS — N179 Acute kidney failure, unspecified: Secondary | ICD-10-CM | POA: Diagnosis present

## 2014-10-24 DIAGNOSIS — N401 Enlarged prostate with lower urinary tract symptoms: Secondary | ICD-10-CM | POA: Diagnosis present

## 2014-10-24 DIAGNOSIS — I272 Other secondary pulmonary hypertension: Secondary | ICD-10-CM | POA: Diagnosis present

## 2014-10-24 DIAGNOSIS — J9601 Acute respiratory failure with hypoxia: Secondary | ICD-10-CM | POA: Diagnosis present

## 2014-10-24 DIAGNOSIS — F419 Anxiety disorder, unspecified: Secondary | ICD-10-CM | POA: Diagnosis present

## 2014-10-24 DIAGNOSIS — R0600 Dyspnea, unspecified: Secondary | ICD-10-CM

## 2014-10-24 DIAGNOSIS — N178 Other acute kidney failure: Secondary | ICD-10-CM | POA: Diagnosis not present

## 2014-10-24 DIAGNOSIS — I2510 Atherosclerotic heart disease of native coronary artery without angina pectoris: Secondary | ICD-10-CM | POA: Diagnosis present

## 2014-10-24 DIAGNOSIS — I959 Hypotension, unspecified: Secondary | ICD-10-CM

## 2014-10-24 DIAGNOSIS — R001 Bradycardia, unspecified: Secondary | ICD-10-CM | POA: Diagnosis present

## 2014-10-24 LAB — CBC
HCT: 28.8 % — ABNORMAL LOW (ref 40.0–52.0)
HEMATOCRIT: 30.4 % — AB (ref 40.0–52.0)
Hemoglobin: 9.3 g/dL — ABNORMAL LOW (ref 13.0–18.0)
Hemoglobin: 9.6 g/dL — ABNORMAL LOW (ref 13.0–18.0)
MCH: 28.7 pg (ref 26.0–34.0)
MCH: 28.1 pg (ref 26.0–34.0)
MCHC: 32.2 g/dL (ref 32.0–36.0)
MCHC: 31.5 g/dL — AB (ref 32.0–36.0)
MCV: 89.3 fL (ref 80.0–100.0)
MCV: 89 fL (ref 80.0–100.0)
PLATELETS: 118 10*3/uL — AB (ref 150–440)
PLATELETS: 90 10*3/uL — AB (ref 150–440)
RBC: 3.23 MIL/uL — AB (ref 4.40–5.90)
RBC: 3.42 MIL/uL — ABNORMAL LOW (ref 4.40–5.90)
RDW: 17.4 % — ABNORMAL HIGH (ref 11.5–14.5)
RDW: 17.1 % — AB (ref 11.5–14.5)
WBC: 6.6 10*3/uL (ref 3.8–10.6)
WBC: 5.8 10*3/uL (ref 3.8–10.6)

## 2014-10-24 LAB — RENAL FUNCTION PANEL
ALBUMIN: 3.5 g/dL (ref 3.5–5.0)
ALBUMIN: 3.5 g/dL (ref 3.5–5.0)
ANION GAP: 10 (ref 5–15)
ANION GAP: 6 (ref 5–15)
BUN: 110 mg/dL — AB (ref 6–20)
BUN: 101 mg/dL — ABNORMAL HIGH (ref 6–20)
CALCIUM: 8.3 mg/dL — AB (ref 8.9–10.3)
CHLORIDE: 100 mmol/L — AB (ref 101–111)
CO2: 23 mmol/L (ref 22–32)
CO2: 27 mmol/L (ref 22–32)
CREATININE: 3.36 mg/dL — AB (ref 0.61–1.24)
Calcium: 8.3 mg/dL — ABNORMAL LOW (ref 8.9–10.3)
Chloride: 101 mmol/L (ref 101–111)
Creatinine, Ser: 3.42 mg/dL — ABNORMAL HIGH (ref 0.61–1.24)
GFR, EST AFRICAN AMERICAN: 18 mL/min — AB (ref >60)
GFR, EST AFRICAN AMERICAN: 18 mL/min — AB (ref >60)
GFR, EST NON AFRICAN AMERICAN: 16 mL/min — AB (ref >60)
GFR, EST NON AFRICAN AMERICAN: 16 mL/min — AB (ref >60)
Glucose, Bld: 82 mg/dL (ref 65–99)
Glucose, Bld: 77 mg/dL (ref 65–99)
PHOSPHORUS: 7.1 mg/dL — AB (ref 2.5–4.6)
PHOSPHORUS: 6.5 mg/dL — AB (ref 2.5–4.6)
Potassium: 6.6 mmol/L (ref 3.5–5.1)
SODIUM: 134 mmol/L — AB (ref 135–145)
Sodium: 133 mmol/L — ABNORMAL LOW (ref 135–145)

## 2014-10-24 LAB — GLUCOSE, CAPILLARY: GLUCOSE-CAPILLARY: 111 mg/dL — AB (ref 65–99)

## 2014-10-24 LAB — MAGNESIUM
MAGNESIUM: 2.7 mg/dL — AB (ref 1.7–2.4)
Magnesium: 2.8 mg/dL — ABNORMAL HIGH (ref 1.7–2.4)

## 2014-10-24 LAB — CREATININE, SERUM
Creatinine, Ser: 3.11 mg/dL — ABNORMAL HIGH (ref 0.61–1.24)
GFR calc Af Amer: 20 mL/min — ABNORMAL LOW (ref >60)
GFR, EST NON AFRICAN AMERICAN: 17 mL/min — AB (ref >60)

## 2014-10-24 LAB — BASIC METABOLIC PANEL
Anion gap: 7 (ref 5–15)
BUN: 126 mg/dL — ABNORMAL HIGH (ref 6–20)
CHLORIDE: 101 mmol/L (ref 101–111)
CO2: 24 mmol/L (ref 22–32)
CREATININE: 3.4 mg/dL — AB (ref 0.61–1.24)
Calcium: 8.3 mg/dL — ABNORMAL LOW (ref 8.9–10.3)
GFR calc non Af Amer: 16 mL/min — ABNORMAL LOW (ref >60)
GFR, EST AFRICAN AMERICAN: 18 mL/min — AB (ref >60)
Glucose, Bld: 80 mg/dL (ref 65–99)
Potassium: 7.1 mmol/L (ref 3.5–5.1)
SODIUM: 132 mmol/L — AB (ref 135–145)

## 2014-10-24 LAB — TROPONIN I: Troponin I: <0.03 ng/mL (ref <0.031)

## 2014-10-24 LAB — VALPROIC ACID LEVEL: Valproic Acid Lvl: 40 ug/mL — ABNORMAL LOW (ref 50.0–100.0)

## 2014-10-24 LAB — BRAIN NATRIURETIC PEPTIDE: B NATRIURETIC PEPTIDE 5: 295 pg/mL — AB (ref 0.0–100.0)

## 2014-10-24 MED ORDER — SERTRALINE HCL 25 MG PO TABS
25.0000 mg | ORAL_TABLET | Freq: Every day | ORAL | Status: DC
  Administered 2014-10-25 – 2014-10-31 (×7): 25 mg via ORAL
  Filled 2014-10-24 (×8): qty 1

## 2014-10-24 MED ORDER — ACETAMINOPHEN 650 MG RE SUPP: 650.0000 mg | Freq: Four times a day (QID) | RECTAL | Status: DC | PRN

## 2014-10-24 MED ORDER — ONDANSETRON HCL 4 MG/2ML IJ SOLN: 4.0000 mg | Freq: Four times a day (QID) | INTRAMUSCULAR | Status: DC | PRN

## 2014-10-24 MED ORDER — ONDANSETRON HCL 4 MG PO TABS: 4.0000 mg | ORAL_TABLET | Freq: Four times a day (QID) | ORAL | Status: DC | PRN

## 2014-10-24 MED ORDER — SIMVASTATIN 20 MG PO TABS: 10.0000 mg | ORAL_TABLET | Freq: Every day | ORAL | Status: DC

## 2014-10-24 MED ORDER — GLUCAGON HCL RDNA (DIAGNOSTIC) 1 MG IJ SOLR
INTRAMUSCULAR | Status: AC
  Administered 2014-10-24: 3 mg via INTRAVENOUS
  Filled 2014-10-24: qty 3

## 2014-10-24 MED ORDER — ASPIRIN EC 81 MG PO TBEC
81.0000 mg | DELAYED_RELEASE_TABLET | Freq: Every day | ORAL | Status: DC
  Administered 2014-10-25 – 2014-10-31 (×7): 81 mg via ORAL
  Filled 2014-10-24 (×7): qty 1

## 2014-10-24 MED ORDER — SODIUM CHLORIDE 0.9 % IJ SOLN
3.0000 mL | INTRAMUSCULAR | Status: DC | PRN
  Administered 2014-10-27 – 2014-10-29 (×3): 3 mL via INTRAVENOUS
  Filled 2014-10-24 (×3): qty 10

## 2014-10-24 MED ORDER — DEXTROSE 5 % IV SOLN: 2.0000 ug/min | INTRAVENOUS | Status: DC

## 2014-10-24 MED ORDER — HEPARIN SODIUM (PORCINE) 5000 UNIT/ML IJ SOLN
5000.0000 [IU] | Freq: Three times a day (TID) | INTRAMUSCULAR | Status: DC
  Administered 2014-10-24 – 2014-10-31 (×20): 5000 [IU] via SUBCUTANEOUS
  Filled 2014-10-24 (×20): qty 1

## 2014-10-24 MED ORDER — GLUCAGON HCL (RDNA) 1 MG IJ SOLR
3.0000 mg | Freq: Once | INTRAMUSCULAR | Status: AC
  Administered 2014-10-24: 3 mg via INTRAVENOUS

## 2014-10-24 MED ORDER — SODIUM BICARBONATE 8.4 % IV SOLN
50.0000 meq | Freq: Once | INTRAVENOUS | Status: AC
  Administered 2014-10-24: 50 meq via INTRAVENOUS
  Filled 2014-10-24: qty 50

## 2014-10-24 MED ORDER — HEPARIN SODIUM (PORCINE) 1000 UNIT/ML DIALYSIS
1000.0000 [IU] | INTRAMUSCULAR | Status: DC | PRN
  Administered 2014-10-26: 1000 [IU] via INTRAVENOUS_CENTRAL
  Filled 2014-10-24 (×4): qty 6

## 2014-10-24 MED ORDER — INSULIN ASPART 100 UNIT/ML ~~LOC~~ SOLN
10.0000 [IU] | Freq: Once | SUBCUTANEOUS | Status: AC
  Administered 2014-10-24: 10 [IU] via INTRAVENOUS
  Filled 2014-10-24: qty 10

## 2014-10-24 MED ORDER — SODIUM CHLORIDE 0.9 % IV SOLN
1.0000 g | Freq: Once | INTRAVENOUS | Status: AC
  Administered 2014-10-24: 1 g via INTRAVENOUS
  Filled 2014-10-24: qty 10

## 2014-10-24 MED ORDER — SODIUM CHLORIDE 0.9 % IV SOLN: 250.0000 mL | INTRAVENOUS | Status: DC | PRN

## 2014-10-24 MED ORDER — SODIUM CHLORIDE 0.9 % IJ SOLN
3.0000 mL | Freq: Two times a day (BID) | INTRAMUSCULAR | Status: DC
  Administered 2014-10-24 – 2014-10-31 (×13): 3 mL via INTRAVENOUS

## 2014-10-24 MED ORDER — MORPHINE SULFATE (PF) 2 MG/ML IV SOLN
0.5000 mg | INTRAVENOUS | Status: DC | PRN
  Administered 2014-10-25 – 2014-10-28 (×3): 0.5 mg via INTRAVENOUS
  Filled 2014-10-24 (×4): qty 1

## 2014-10-24 MED ORDER — FENTANYL CITRATE (PF) 100 MCG/2ML IJ SOLN
25.0000 ug | Freq: Once | INTRAMUSCULAR | Status: AC
  Administered 2014-10-24: 25 ug via INTRAVENOUS
  Filled 2014-10-24: qty 2

## 2014-10-24 MED ORDER — SIMVASTATIN 20 MG PO TABS: 20.0000 mg | ORAL_TABLET | Freq: Every day | ORAL | Status: DC

## 2014-10-24 MED ORDER — GLUCAGON HCL RDNA (DIAGNOSTIC) 1 MG IJ SOLR
3.0000 mg/h | INTRAVENOUS | Status: DC
  Filled 2014-10-24: qty 5

## 2014-10-24 MED ORDER — ALBUTEROL SULFATE (2.5 MG/3ML) 0.083% IN NEBU
2.5000 mg | INHALATION_SOLUTION | Freq: Once | RESPIRATORY_TRACT | Status: AC
  Administered 2014-10-24: 2.5 mg via RESPIRATORY_TRACT
  Filled 2014-10-24: qty 3

## 2014-10-24 MED ORDER — NOREPINEPHRINE 4 MG/250ML-% IV SOLN
INTRAVENOUS | Status: AC
  Filled 2014-10-24: qty 250

## 2014-10-24 MED ORDER — NOREPINEPHRINE 4 MG/250ML-% IV SOLN
0.0000 ug/min | INTRAVENOUS | Status: DC
Start: 2014-10-24 — End: 2014-10-28
  Administered 2014-10-24: 8 ug/min via INTRAVENOUS
  Administered 2014-10-25: 5 ug/min via INTRAVENOUS
  Administered 2014-10-25: 12 ug/min via INTRAVENOUS
  Administered 2014-10-25: 15 ug/min via INTRAVENOUS
  Filled 2014-10-24 (×3): qty 250

## 2014-10-24 MED ORDER — SIMVASTATIN 20 MG PO TABS
30.0000 mg | ORAL_TABLET | Freq: Every day | ORAL | Status: DC
  Administered 2014-10-25 – 2014-10-30 (×6): 30 mg via ORAL
  Filled 2014-10-24 (×3): qty 1
  Filled 2014-10-24 (×4): qty 2

## 2014-10-24 MED ORDER — PUREFLOW DIALYSIS SOLUTION
INTRAVENOUS | Status: DC
  Administered 2014-10-24 – 2014-10-25 (×2): via INTRAVENOUS_CENTRAL

## 2014-10-24 MED ORDER — DEXTROSE 50 % IV SOLN
1.0000 | Freq: Once | INTRAVENOUS | Status: AC
  Administered 2014-10-24: 50 mL via INTRAVENOUS
  Filled 2014-10-24: qty 50

## 2014-10-24 MED ORDER — DIVALPROEX SODIUM ER 500 MG PO TB24
1000.0000 mg | ORAL_TABLET | Freq: Every day | ORAL | Status: DC
  Administered 2014-10-25: 1000 mg via ORAL
  Filled 2014-10-24 (×4): qty 2

## 2014-10-24 MED ORDER — ACETAMINOPHEN 325 MG PO TABS
650.0000 mg | ORAL_TABLET | Freq: Four times a day (QID) | ORAL | Status: DC | PRN
  Administered 2014-10-28 – 2014-10-30 (×3): 650 mg via ORAL
  Filled 2014-10-24 (×3): qty 2

## 2014-10-24 NOTE — H&P (Signed)
[facility] - [place] [facility]   PATIENT NAME: Billy Liu    MR#:  [id number]  DATE OF BIRTH:  [birth year]  DATE OF ADMISSION:  10/24/2014  PRIMARY CARE PHYSICIAN: No primary care provider on file.   REQUESTING/REFERRING PHYSICIAN: Dr. Langston Masker  CHIEF COMPLAINT:  Generalized weakness shortness of breath weight gain  HISTORY OF PRESENT ILLNESS:  Billy Liu  is a 81 y.o. male with a known history of chronic diastolic heart failure EF of 55% with echo in September 2016, malignant hypertension, CKD- III Baseline creatinine around 1.5, COPD, hyperlipidemia, anxiety, opacity consistent emergency room for vital manner with generalized weakness, shortness of breath and sats dropping down in the upper 80s. Patient was found to be initially had multiple purpuric pressure over blood pressure dropped into the 60s. He was also found to be bradycardic with heart rate in the 35-40's. He has an external pacemaker and it currently has a heart rate of 70. He is on low-dose levo fed. His creatinine is up to 3.4 and potassium is 7.1. Given his cardiac dysrhythmias and is severely critically high potassium with acute on chronic renal failure patient is going to get origin to the hemodialyze with CRRT. Dr. Gilda Crease will be placing temporary dialysis catheter. Patient was seen by Dr. Thedore Mins in the emergency room. He was also seen by Dr. Carmon Ginsberg [facility] cardiology.  PAST MEDICAL HISTORY:   Past Medical History  Diagnosis Date  . Chronic diastolic CHF (congestive heart failure) (HCC)   . COPD (chronic obstructive pulmonary disease) (HCC)   . Coronary artery disease   . Diabetes mellitus without complication (HCC)   . Hypertension   . Renal disorder   . GERD (gastroesophageal reflux disease)   . Hyperlipemia   . Anxiety   . Obesity   . CKD (chronic kidney disease), stage III     PAST SURGICAL HISTOIRY:  History reviewed. No pertinent past surgical history.  SOCIAL HISTORY:    Social History  Substance Use Topics  . Smoking status: Never Smoker   . Smokeless tobacco: Former Neurosurgeon    Types: Chew  . Alcohol Use: No    FAMILY HISTORY:  No family history on file.  DRUG ALLERGIES:   Allergies  Allergen Reactions  . Penicillins Other (See Comments)    Reaction:  Unknown     REVIEW OF SYSTEMS:  Review of Systems  Constitutional: Positive for malaise/fatigue. Negative for fever, chills and weight loss.  HENT: Negative for ear discharge, ear pain and nosebleeds.   Eyes: Negative for blurred vision, pain and discharge.  Respiratory: Positive for cough and shortness of breath. Negative for sputum production, wheezing and stridor.   Cardiovascular: Positive for chest pain. Negative for palpitations and PND.  Gastrointestinal: Negative for nausea, vomiting, abdominal pain and diarrhea.  Genitourinary: Negative for urgency and frequency.  Musculoskeletal: Negative for back pain and joint pain.  Neurological: Positive for weakness. Negative for sensory change, speech change and focal weakness.  Psychiatric/Behavioral: Negative for depression and hallucinations. The patient is not nervous/anxious.   All other systems reviewed and are negative.    MEDICATIONS AT HOME:   Prior to Admission medications   Medication Sig Start Date End Date Taking? Authorizing Provider  acetaminophen (TYLENOL) 500 MG tablet Take 500 mg by mouth 3 (three) times daily.   Yes Historical Provider, MD  amLODipine (NORVASC) 10 MG tablet Take 10 mg by mouth daily.   Yes Historical Provider, MD  aspirin EC 81 MG tablet  Take 81 mg by mouth daily.   Yes Historical Provider, MD  cephALEXin (KEFLEX) 500 MG capsule Take 1 capsule (500 mg total) by mouth every 8 (eight) hours. 10/17/14  Yes Shaune Pollack, MD  cloNIDine (CATAPRES) 0.2 MG tablet Take 0.2 mg by mouth 2 (two) times daily.   Yes Historical Provider, MD  divalproex (DEPAKOTE ER) 500 MG 24 hr tablet Take 1,000 mg by mouth at bedtime.    Yes Historical Provider, MD  gabapentin (NEURONTIN) 100 MG capsule Take 100 mg by mouth at bedtime.    Yes Historical Provider, MD  glucagon, human recombinant, (GLUCAGEN DIAGNOSTIC) 1 MG injection Inject 1 mg into the muscle once as needed for low blood sugar.   Yes Historical Provider, MD  hydrALAZINE (APRESOLINE) 25 MG tablet Take 3 tablets (75 mg total) by mouth every 6 (six) hours. 10/17/14  Yes Shaune Pollack, MD  insulin detemir (LEVEMIR) 100 UNIT/ML injection Inject 24-36 Units into the skin 2 (two) times daily. Pt uses 36 units in the morning and 24 units at bedtime.   Yes Historical Provider, MD  insulin lispro (HUMALOG) 100 UNIT/ML injection Inject 12 Units into the skin 2 (two) times daily with breakfast and lunch. Hold if blood sugar is less than 100.   Yes Historical Provider, MD  lisinopril (PRINIVIL,ZESTRIL) 20 MG tablet Take 20 mg by mouth 2 (two) times daily.   Yes Historical Provider, MD  metoprolol succinate (TOPROL-XL) 100 MG 24 hr tablet Take 100 mg by mouth daily.   Yes Historical Provider, MD  potassium chloride SA (K-DUR,KLOR-CON) 20 MEQ tablet Take 20 mEq by mouth 2 (two) times daily.   Yes Historical Provider, MD  senna (SENOKOT) 8.6 MG TABS tablet Take 2 tablets by mouth at bedtime.   Yes Historical Provider, MD  sertraline (ZOLOFT) 25 MG tablet Take 25 mg by mouth daily.   Yes Historical Provider, MD  simvastatin (ZOCOR) 10 MG tablet Take 10 mg by mouth at bedtime. Pt takes with a  tablet.   Yes Historical Provider, MD  simvastatin (ZOCOR) 20 MG tablet Take 20 mg by mouth at bedtime. Pt takes with a  tablet.   Yes Historical Provider, MD  torsemide (DEMADEX) 20 MG tablet Take 80 mg by mouth daily.    Yes Historical Provider, MD  Vitamin D, Ergocalciferol, (DRISDOL) 50000 UNITS CAPS capsule Take 50,000 Units by mouth every 30 (thirty) days. Pt takes on the 3rd of every month.   Yes Historical Provider, MD      VITAL SIGNS:  Blood pressure 65/51, pulse 70, resp. rate  14, SpO2 93 %.  PHYSICAL EXAMINATION:  GENERAL:  81 y.o.-year-old patient lying in the bed with mild to moderate distress and is critically ill-appearing morbidly obese EYES: Pupils equal, round, reactive to light and accommodation. No scleral icterus. Extraocular muscles intact.  HEENT: Head atraumatic, normocephalic. Oropharynx and nasopharynx clear.  NECK:  Supple, no jugular venous distention. No thyroid enlargement, no tenderness.  LUNGS: Decreased l breath sounds bilaterally, no wheezing, rales,rhonchi or crepitation. No use of accessory muscles of respiration.  CARDIOVASCULAR: S1, S2 normal. No murmurs, rubs, or gallops. Paced rhythm patient has an external pacemaker ABDOMEN: Soft, nontender, nondistended. Bowel sounds present. No organomegaly or mass.  EXTREMITIES:severe +++ pedal edema, no cyanosis, or clubbing.  NEUROLOGIC: Unable to assess at present patient is critically ill grossly nonfocal  PSYCHIATRIC: Altered mental status somewhat lethargic SKIN: No obvious rash, lesion, or ulcer.   LABORATORY PANEL:   CBC  Recent Labs  Lab 10/24/14 1607  WBC 6.6  HGB 9.3*  HCT 28.8*  PLT 118*   ------------------------------------------------------------------------------------------------------------------  Chemistries   Recent Labs Lab 10/24/14 1607  NA 132*  K 7.1*  CL 101  CO2 24  GLUCOSE 80  BUN 126*  CREATININE 3.40*  CALCIUM 8.3*   ------------------------------------------------------------------------------------------------------------------  Cardiac Enzymes  Recent Labs Lab 10/24/14 1607  TROPONINI <0.03   ------------------------------------------------------------------------------------------------------------------  RADIOLOGY:  Dg Chest Portable 1 View  10/24/2014  CLINICAL DATA:  Shortness of Breath EXAM: PORTABLE CHEST - 1 VIEW COMPARISON:  10/11/2014 FINDINGS: Cardiac shadow is mildly enlarged. Mild vascular congestion is noted as well small  pleural effusions bilaterally. The overall appearance is not significantly change from the prior study. No new focal abnormality is seen. IMPRESSION: Vascular congestion small pleural effusions. Electronically Signed   By: Alcide CleverMark  Lukens M.D.   On: 10/24/2014 16:43    EKG:   Sinus bradycardia heart rate in the 30s IMPRESSION AND PLAN:    81 y.o. male with a known history of chronic diastolic heart failure EF of 55% with echo in September 2016, malignant hypertension, CKD- III Baseline creatinine around 1.5, COPD, hyperlipidemia, anxiety, opacity consistent emergency room for vital manner with generalized weakness, shortness of breath and sats dropping down in the upper 80s  1. Acute on chronic renal failure with severe hyperkalemia Patient has history of chronic kidney disease stage III baseline creatinine of 1.5. He came in with creatinine of 3.4 and potassium of 7.0. Admit to ICU Patient received calcium gluconate dextrose and insulin in the emergency room for treatment of his hyperkalemia. Urgent CRRT tonight. Vascular surgery for temporary dialysis catheter placement. Avoid nephrotoxins monitor I's and O's follow-up creatinine.  2. Severe bradycardia suspect due to #1 Patient has a temporary, external cardiac pacer Heart rate currently is in the 70s Patient was seen by cardiology Dr. Carmon GinsbergF ATH Cycle cardiac enzymes 3  3. Hypoxia with acute respiratory failure suspect mild acute on chronic diastolic heart failure with significant leg edema and anasarca Given renal failure would not be able to give Lasix along with hypotension Continue IV Levophed Hopefully CRRT would help and ultrafiltration be able to do. Recent echo was done in September 2016 I will not repeat echo showed EF of 55% and normal systolic function mild to moderate MR  4. Hypotension suspect due to #1 Continue levophed titrated to keep MEP greater than 65.  5. DM 2 Sliding scale insulin for now.  6. GERD continue  PPI  Above was discussed with patient patient and patient's brother Fayrene FearingJames was present emergency room.  Brother understands patient is critically ill. Patient is a full code. All the records are reviewed and case discussed with ED provider. Management plans discussed with the patient, family and they are in agreement. Case discussed with Dr. Thedore MinsSingh and Dr. Lady GaryFath.  CODE STATUS: Full  TOTAL CRITICAL TIME TAKING CARE OF THIS PATIENT: 60 minutes.    Tinley Rought M.D on 10/24/2014 at 5:51 PM  Between 7am to 6pm - Pager - [phone]  After 6pm go to [email or web address] - password [email or web address] [facility]  [facility]  Office  [phone]  CC: Primary care physician; No primary care provider on file.

## 2014-10-24 NOTE — Op Note (Signed)
  OPERATIVE NOTE   PROCEDURE: 1. Insertion of temporary dialysis catheter catheter right femoral approach.  PRE-OPERATIVE DIAGNOSIS: Acute renal insufficiency; malignant hyperkalemia  POST-OPERATIVE DIAGNOSIS: Same  SURGEON: Renford DillsGregory G Jisell Majer M.D.  ANESTHESIA: 1% lidocaine local infiltration  ESTIMATED BLOOD LOSS: Minimal cc  INDICATIONS:   Feliz BeamLawrence Dalsanto is a 81 y.o. male who presents with cardiac arrest is now being paced he is found to have a potassium of 7 and acute renal failure.  DESCRIPTION: After obtaining full informed written consent, the patient was positioned supine. The right groin was prepped and draped in a sterile fashion. Ultrasound was placed in a sterile sleeve. Ultrasound was utilized to identify the right common femoral vein which is noted to be echolucent and compressible indicating patency. Images recorded for the permanent record. Under real-time visualization a Seldinger needle is inserted into the vein and the guidewires advanced without difficulty. Small counterincision was made at the wire insertion site. Dilator is passed over the wire and the temporary dialysis catheter catheter is fed over the wire without difficulty.  All lumens aspirate and flush easily and are packed with heparin saline. Catheter secured to the skin of the right thigh with 2-0 silk. A sterile dressing is applied with Biopatch.  COMPLICATIONS: None  CONDITION: Critical  Renford DillsGregory G Jamecia Lerman, M.D. [place] [facility]. Office:  [phone]

## 2014-10-24 NOTE — Consult Note (Signed)
[facility] CLINIC CARDIOLOGY [facility]  CARDIOLOGY CONSULT NOTE  Patient ID: Billy Liu MRN: [id number] DOB/AGE: Oct 07, 1932 81 y.o.  Admit date: 10/24/2014 Referring Physician Dr. Allena Katz Primary Physician   Primary Cardiologist Paraschos Reason for Consultation Bradycardia  HPI: 81 yo male with history of diastolic heart failure with ef of 55 %, hypertension, dm, ckd iii, hyperlipidemia who was recently admitted to [facility] for left lower extremety cellulitis. He was discharged to his care facility . He is a difficult historian but apparently has had problems with his serum glucose being low. He was brought to the er today with complaints of generalized weakness, sob and desaturation with heart rates in the 35-45% range. He has placed on an external pacemaker with hypotension. He had improvement in his blood pressure with external pacing with high mA. Laboratory data revealed hyperkalemia of 7.1 and acute on chronic renal insuffiency with serum creatinine of 3.4  Serum sodium was 132. He is a difficult historian but denies chest pain and complains of chest pain. Serum troponin of 0.3. He was discharged on  Clonidine and high dose beta bockers at 200 mg metoprolol xl daily and potassium choloride 20 meq bid.   ROS Review of Systems - General ROS: positive for  - fatigue and malaise Respiratory ROS: positive for - shortness of breath Cardiovascular ROS: negative for - chest pain Gastrointestinal ROS: no abdominal pain, change in bowel habits, or black or bloody stools Musculoskeletal ROS: negative Neurological ROS: no TIA or stroke symptoms   Past Medical History  Diagnosis Date  . Chronic diastolic CHF (congestive heart failure) (HCC)   . COPD (chronic obstructive pulmonary disease) (HCC)   . Coronary artery disease   . Diabetes mellitus without complication (HCC)   . Hypertension   . Renal disorder   . GERD (gastroesophageal reflux disease)   . Hyperlipemia   . Anxiety   .  Obesity   . CKD (chronic kidney disease), stage III     No family history on file.  Social History   Social History  . Marital Status: Married    Spouse Name: N/A  . Number of Children: N/A  . Years of Education: N/A   Occupational History  . Not on file.   Social History Main Topics  . Smoking status: Never Smoker   . Smokeless tobacco: Former Neurosurgeon    Types: Chew  . Alcohol Use: No  . Drug Use: Not on file  . Sexual Activity: Not on file   Other Topics Concern  . Not on file   Social History Narrative    History reviewed. No pertinent past surgical history.   Prescriptions prior to admission  Medication Sig Dispense Refill Last Dose  . acetaminophen (TYLENOL) 500 MG tablet Take 500 mg by mouth 3 (three) times daily.   unknown at unknown  . amLODipine (NORVASC) 10 MG tablet Take 10 mg by mouth daily.   unknown at unknown  . aspirin EC 81 MG tablet Take 81 mg by mouth daily.   unknown at unknown  . cephALEXin (KEFLEX) 500 MG capsule Take 1 capsule (500 mg total) by mouth every 8 (eight) hours. 21 capsule 0 unknown at unknown  . cloNIDine (CATAPRES) 0.2 MG tablet Take 0.2 mg by mouth 2 (two) times daily.   unknown at unknown  . divalproex (DEPAKOTE ER) 500 MG 24 hr tablet Take 1,000 mg by mouth at bedtime.   unknown at unknown  . gabapentin (NEURONTIN) 100 MG capsule Take 100  mg by mouth at bedtime.    unknown at unknown  . glucagon, human recombinant, (GLUCAGEN DIAGNOSTIC) 1 MG injection Inject 1 mg into the muscle once as needed for low blood sugar.   PRN at PRN  . hydrALAZINE (APRESOLINE) 25 MG tablet Take 3 tablets (75 mg total) by mouth every 6 (six) hours. 90 tablet 0 unknown at unknown  . insulin detemir (LEVEMIR) 100 UNIT/ML injection Inject 24-36 Units into the skin 2 (two) times daily. Pt uses 36 units in the morning and 24 units at bedtime.   unknown at unknown  . insulin lispro (HUMALOG) 100 UNIT/ML injection Inject 12 Units into the skin 2 (two) times daily with  breakfast and lunch. Hold if blood sugar is less than 100.   unknown at unknown  . lisinopril (PRINIVIL,ZESTRIL) 20 MG tablet Take 20 mg by mouth 2 (two) times daily.   unknown at unknown  . metoprolol succinate (TOPROL-XL) 100 MG 24 hr tablet Take 100 mg by mouth daily.   unknown at unknown  . potassium chloride SA (K-DUR,KLOR-CON) 20 MEQ tablet Take 20 mEq by mouth 2 (two) times daily.   unknown at unknown  . senna (SENOKOT) 8.6 MG TABS tablet Take 2 tablets by mouth at bedtime.   unknown at unknown  . sertraline (ZOLOFT) 25 MG tablet Take 25 mg by mouth daily.   unknown at unknown  . simvastatin (ZOCOR) 10 MG tablet Take 10 mg by mouth at bedtime. Pt takes with a  tablet.   unknown at unknown  . simvastatin (ZOCOR) 20 MG tablet Take 20 mg by mouth at bedtime. Pt takes with a  tablet.   unknown at unknown  . torsemide (DEMADEX) 20 MG tablet Take 80 mg by mouth daily.    unknown at unknown  . Vitamin D, Ergocalciferol, (DRISDOL) 50000 UNITS CAPS capsule Take 50,000 Units by mouth every 30 (thirty) days. Pt takes on the 3rd of every month.   unknown at unknown    Physical Exam: Blood pressure 88/52, pulse 69, resp. rate 14, SpO2 95 %.    General appearance: cooperative, moderate distress, moderately obese and slowed mentation Resp: diminished breath sounds bilaterally Cardio: bradycardia GI: soft, non-tender; bowel sounds normal; no masses,  no organomegaly Extremities: edema bilateral lowerextremety edema Neurologic: Mental status: alertness: lethargic, orientation: person Labs:   Lab Results  Component Value Date   WBC 6.6 10/24/2014   HGB 9.3* 10/24/2014   HCT 28.8* 10/24/2014   MCV 89.3 10/24/2014   PLT 118* 10/24/2014    Recent Labs Lab 10/24/14 1607  NA 132*  K 7.1*  CL 101  CO2 24  BUN 126*  CREATININE 3.40*  CALCIUM 8.3*  GLUCOSE 80   Lab Results  Component Value Date   TROPONINI <0.03 10/24/2014      Radiology: vascular congestion with small pleural  effusions EKG: bradycardia  ASSESSMENT AND PLAN:  81 yo male with history of diastolic heart failure with preserved lv function, history of acute on chronic renal failure with hyperkalemia and bradycardia. Pt is currently hemodynamically stable with external pacemaker at high mA with good capture. Bradycardia is likely secondary to high dose beta blockers and clonidine and acute renal failure with hyperkalemia. May need temp/perma pacemaker if symtpomatic bradycardia persists after treatment of his hyperkalemia . Hold beta blockers and clonidine as is being done and acute dialysis to reduce serum potassium Calcium gluconate, d50 and insulin acutely . Keep mA on eternal pacer at current rate until elctrlytes are  corrected.  Signed: Dalia HeadingFATH,KENNETH A. MD, [facility] 10/24/2014, 7:51 PM

## 2014-10-24 NOTE — Progress Notes (Signed)
eLink Physician-Brief Progress Note Patient Name: Billy Liu DOB: 06/09/1932 MRN: [id number]   Date of Service  10/24/2014  HPI/Events of Note  Hypothermia. Need order for Norepinephrine that the patient is currently on.   eICU Interventions  Will order: 1. [employer]. 2. Norepinephrine IV infusion. Titrate to MAP >= 65.     Intervention Category Minor Interventions: Routine modifications to care plan (e.g. PRN medications for pain, fever)  Sommer,Steven Eugene 10/24/2014, 9:20 PM

## 2014-10-24 NOTE — ED Notes (Signed)
Milliamps increased to 140

## 2014-10-24 NOTE — ED Provider Notes (Addendum)
[facility] Emergency Department Provider Note  ____________________________________________  Time seen: Seen upon arrival to the emergency department  I have reviewed the triage vital signs and the nursing notes.   HISTORY  Chief Complaint Bradycardia    HPI Billy Liu is a 81 y.o. male with a history of chronic kidney disease, hypertension and CK D who had a recent admission for cellulitis who is presenting today for altered mental status with bradycardia. Over the past 4 days the patient has had a 10 pound weight gain. He was found to be hypoxic and his long-term care facility today to the 80s. EMS was called and when they arrived they found him to be with a decreased level of consciousness with a heart rate in the 30s and blood pressure in the 50s over 30s. He was given 1 mg of atropine without any resolution of his symptoms. At that point the medics called for medical command and I instructed them to give him 25 g of fentanyl and start transcutaneous pacing. They said after transient cutaneous pacing that his blood pressure came up to the 100 systolic. They also placed him on nonrebreather with resolution of his hypoxia to the 90s. He was arousable to verbal as well as tactile stimuli for the medics. A right hand 18-gauge IV was placed by the medics as well.   Past Medical History  Diagnosis Date  . Chronic diastolic CHF (congestive heart failure) (HCC)   . COPD (chronic obstructive pulmonary disease) (HCC)   . Coronary artery disease   . Diabetes mellitus without complication (HCC)   . Hypertension   . Renal disorder   . GERD (gastroesophageal reflux disease)   . Hyperlipemia   . Anxiety   . Obesity   . CKD (chronic kidney disease), stage III     Patient Active Problem List   Diagnosis Date Noted  . Acute on chronic diastolic (congestive) heart failure (HCC) 10/11/2014  . Acute respiratory failure with hypoxia (HCC) 10/11/2014  . CAD  (coronary artery disease) 10/11/2014  . COPD (chronic obstructive pulmonary disease) (HCC) 10/11/2014  . Type 2 diabetes mellitus (HCC) 10/11/2014  . HTN (hypertension) 10/11/2014  . GERD (gastroesophageal reflux disease) 10/11/2014  . CKD (chronic kidney disease), stage III 10/11/2014  . Accelerated hypertension 10/11/2014    History reviewed. No pertinent past surgical history.  Current Outpatient Rx  Name  Route  Sig  Dispense  Refill  . acetaminophen (TYLENOL) 500 MG tablet   Oral   Take 500 mg by mouth 3 (three) times daily.         [name] amLODipine (NORVASC) 10 MG tablet   Oral   Take 10 mg by mouth daily.         [name] aspirin EC 81 MG tablet   Oral   Take 81 mg by mouth daily.         . cephALEXin (KEFLEX) 500 MG capsule   Oral   Take 1 capsule (500 mg total) by mouth every 8 (eight) hours.   21 capsule   0   . cloNIDine (CATAPRES) 0.2 MG tablet   Oral   Take 0.2 mg by mouth 2 (two) times daily.         . divalproex (DEPAKOTE ER) 500 MG 24 hr tablet   Oral   Take 1,000 mg by mouth at bedtime.         . gabapentin (NEURONTIN) 100 MG capsule   Oral   Take 100 mg by  mouth at bedtime.          [name] glucagon, human recombinant, (GLUCAGEN DIAGNOSTIC) 1 MG injection   Intramuscular   Inject 1 mg into the muscle once as needed for low blood sugar.         . hydrALAZINE (APRESOLINE) 25 MG tablet   Oral   Take 3 tablets (75 mg total) by mouth every 6 (six) hours.   90 tablet   0   . insulin detemir (LEVEMIR) 100 UNIT/ML injection   Subcutaneous   Inject 24-36 Units into the skin 2 (two) times daily. Pt uses 36 units in the morning and 24 units at bedtime.         . insulin lispro (HUMALOG) 100 UNIT/ML injection   Subcutaneous   Inject 12 Units into the skin 2 (two) times daily with breakfast and lunch. Hold if blood sugar is less than 100.         [name] lisinopril (PRINIVIL,ZESTRIL) 20 MG tablet   Oral   Take 20 mg by mouth 2 (two) times daily.          . metoprolol succinate (TOPROL-XL) 100 MG 24 hr tablet   Oral   Take 100 mg by mouth daily.         . potassium chloride SA (K-DUR,KLOR-CON) 20 MEQ tablet   Oral   Take 20 mEq by mouth 2 (two) times daily.         [name] senna (SENOKOT) 8.6 MG TABS tablet   Oral   Take 2 tablets by mouth at bedtime.         . sertraline (ZOLOFT) 25 MG tablet   Oral   Take 25 mg by mouth daily.         . simvastatin (ZOCOR) 10 MG tablet   Oral   Take 10 mg by mouth at bedtime. Pt takes with a  tablet.         . simvastatin (ZOCOR) 20 MG tablet   Oral   Take 20 mg by mouth at bedtime. Pt takes with a  tablet.         . torsemide (DEMADEX) 20 MG tablet   Oral   Take 80 mg by mouth daily.          . Vitamin D, Ergocalciferol, (DRISDOL) 50000 UNITS CAPS capsule   Oral   Take 50,000 Units by mouth every 30 (thirty) days. Pt takes on the 3rd of every month.           Allergies Penicillins  No family history on file.  Social History Social History  Substance Use Topics  . Smoking status: Never Smoker   . Smokeless tobacco: Former Neurosurgeon    Types: Chew  . Alcohol Use: No    Review of Systems Constitutional: No fever/chills Eyes: No visual changes. ENT: No sore throat. Cardiovascular: Denies chest pain. Respiratory: Denies shortness of breath. Gastrointestinal: No abdominal pain.  No nausea, no vomiting.  No diarrhea.  No constipation. Genitourinary: Negative for dysuria. Musculoskeletal: Negative for back pain. Skin: Negative for rash. Neurological: Negative for headaches, focal weakness or numbness.  10-point ROS otherwise negative.  ____________________________________________   PHYSICAL EXAM:  VITAL SIGNS: ED Triage Vitals  Enc Vitals Group     BP 10/24/14 1601 155/88 mmHg     Pulse Rate 10/24/14 1601 76     Resp 10/24/14 1601 22     Temp --      Temp src --  SpO2 10/24/14 1555 84 %     Weight --      Height --      Head Cir --       Peak Flow --      Pain Score --      Pain Loc --      Pain Edu? --      Excl. in GC? --     Constitutional: Alert and oriented to self and place. Patient does close his eyes when not stimulated but easily arousable. Eyes: Conjunctivae are normal. PERRL. EOMI. Head: Atraumatic. Nose: No congestion/rhinnorhea. Mouth/Throat: Mucous membranes are moist.  Oropharynx non-erythematous. Neck: No stridor.   Cardiovascular: Bradycardic, regular rhythm. Grossly normal heart sounds.  Nonpalpable radial as well as dorsalis pedis pulses. Patient being paced at 100 mA at a rate of 70 without apparent mechanical capture. Respiratory: Normal respiratory effort.  No retractions. Lungs with rales to the bilateral mid fields. Gastrointestinal: Soft and nontender. No distention. No abdominal bruits. No CVA tenderness. Musculoskeletal: No lower extremity tenderness nor edema.  No joint effusions. Neurologic:  Normal speech and language. No gross focal neurologic deficits are appreciated. Skin:  Skin is warm, dry and intact. No rash noted. Psychiatric: Mood and affect are normal. Speech and behavior are normal.  ____________________________________________   LABS (all labs ordered are listed, but only abnormal results are displayed)  Labs Reviewed  BASIC METABOLIC PANEL - Abnormal; Notable for the following:    Sodium 132 (*)    Potassium 7.1 (*)    BUN 126 (*)    Creatinine, Ser 3.40 (*)    Calcium 8.3 (*)    GFR calc non Af Amer 16 (*)    GFR calc Af Amer 18 (*)    All other components within normal limits  CBC - Abnormal; Notable for the following:    RBC 3.23 (*)    Hemoglobin 9.3 (*)    HCT 28.8 (*)    RDW 17.4 (*)    Platelets 118 (*)    All other components within normal limits  BRAIN NATRIURETIC PEPTIDE - Abnormal; Notable for the following:    B Natriuretic Peptide 295.0 (*)    All other components within normal limits  GLUCOSE, CAPILLARY - Abnormal; Notable for the following:     Glucose-Capillary 111 (*)    All other components within normal limits  VALPROIC ACID LEVEL - Abnormal; Notable for the following:    Valproic Acid Lvl 40 (*)    All other components within normal limits  TROPONIN I   ____________________________________________  EKG  ED ECG REPORT I, Arelia LongestSchaevitz,  Kyshaun Barnette M, the attending physician, personally viewed and interpreted this ECG.   Date: 10/24/2014  EKG Time: 1524  Rate: 32  Rhythm: sinus bradycardia  Axis: Normal axis  Intervals:first-degree A-V block   ST&T Change: No abnormal T-wave inversions. No ST elevation or depression.    ____________________________________________  RADIOLOGY  Vascular congestion with small pleural effusions. ____________________________________________   PROCEDURES  CRITICAL CARE Performed by: Arelia LongestSchaevitz,  Harout Scheurich M   Total critical care time:60 minutes  Critical care time was exclusive of separately billable procedures and treating other patients.  Critical care was necessary to treat or prevent imminent or life-threatening deterioration.  Critical care was time spent personally by me on the following activities: development of treatment plan with patient and/or surrogate as well as nursing, discussions with consultants, evaluation of patient's response to treatment, examination of patient, obtaining history from patient or surrogate, ordering  and performing treatments and interventions, ordering and review of laboratory studies, ordering and review of radiographic studies, pulse oximetry and re-evaluation of patient's condition.  Transcutaneous pacing  Patient maintained a heart rate of 70 on the pacer function. Capture at 140 mA.  ____________________________________________   INITIAL IMPRESSION / ASSESSMENT AND PLAN / ED COURSE  Pertinent labs & imaging results that were available during my care of the patient were reviewed by me and considered in my medical decision making (see chart for  details).  Interventional cardiology paged prior to patient's arrival and I discussed the case with Dr. Kary Kos. He suspects the patient may be having a beta blocker overdose and recommends 3 mg of IV glucagon.  ----------------------------------------- 5:25 PM on 10/24/2014 -----------------------------------------  At this time the patient has been mentating still with alert and oriented 2. He is now with blood pressures in the 60s with still good mechanical capture on the transcutaneous pacer. He was evaluated the bedside by Dr. Lady Gary.  However, while he was being evaluated by Dr. Lady Gary we received information that he had a high potassium. His potassium is 7.1 and he is in acute renal failure. Although he did not have a wide complex bradycardia this is likely the cause for his condition at this time. He did not have a response to glucagon. This makes me feel that it is more likely his potassium and not a beta blocker overdose.  I talked to his nurse, Mickie Bail, who said that he was recently started on 40 mEq total per day of potassium upon discharge from the hospital earlier this week. At that time no his renal function was much better than it was today.  ----------------------------------------- 5:35 PM on 10/24/2014 -----------------------------------------  Dr. Thedore Mins, of the nephrology service is now at the bedside evaluating the patient for dialysis. The patient is on a low dose of Levophed through a peripheral IV. Spoke with Dr. Lorretta Harp of the vascular service who will be coming in to start a temporary dialysis line. I updated the family members at the bedside about his high potassium as well as a need for emergent dialysis and admission to the ICU. Because the patient continues to be hemodynamically unstable and will continue pacing him. He was given temporizing measures for high potassium including insulin, dextrose, bicarbonate albuterol. He was also given calcium. Patient was signed out to Dr.  Allena Katz the medicine service for [facility].   ____________________________________________   FINAL CLINICAL IMPRESSION(S) / ED DIAGNOSES  Hypotension and bradycardia secondary to renal failure and hyperkalemia.     Myrna Blazer, MD 10/24/14 1737  Action to above. Patient with moderate and bilateral lower extremity edema. Running Levophed peripherally at this time because the patient is about to receive a large central line for dialysis access. The medicine service is aware of this.  Myrna Blazer, MD 10/24/14 1805  Spell assignment with house supervisor because the patient is remaining in the emergency department and still is hypotensive on Levophed. I also discussed with Dr. Lorretta Harp was waiting for the patient in ICU so he can place a dialysis catheter.  The house supervisor at this time is working on getting the patient a bed assignment in ICU.  Myrna Blazer, MD 10/24/14 [phone]

## 2014-10-24 NOTE — Progress Notes (Signed)
Spoke with Dr. Lady GaryFath spoke about pt HR being external paced 120 Ma to capture HR 70. Dr. Cottie BandaShneir at bedside  Placing dialysis cath .Elink notified .

## 2014-10-24 NOTE — Consult Note (Signed)
MEDICATION RELATED CONSULT NOTE - INITIAL   Pharmacy Consult for adjust meds for CCRT Indication: CCRT  Allergies  Allergen Reactions  . Penicillins Other (See Comments)    Reaction:  Unknown     Patient Measurements:   Adjusted Body Weight:   Vital Signs: BP: 88/52 mmHg (10/17 1831) Pulse Rate: 69 (10/17 1831) Intake/Output from previous day:   Intake/Output from this shift:    Labs:  Recent Labs  10/24/14 1607  WBC 6.6  HGB 9.3*  HCT 28.8*  PLT 118*  CREATININE 3.40*   Estimated Creatinine Clearance: 24.2 mL/min (by C-G formula based on Cr of 3.4).   Microbiology: Recent Results (from the past 720 hour(s))  MRSA PCR Screening     Status: None   Collection Time: 10/12/14  5:25 AM  Result Value Ref Range Status   MRSA by PCR NEGATIVE NEGATIVE Final    Comment:        The GeneXpert MRSA Assay (FDA approved for NASAL specimens only), is one component of a comprehensive MRSA colonization surveillance program. It is not intended to diagnose MRSA infection nor to guide or monitor treatment for MRSA infections.     Medical History: Past Medical History  Diagnosis Date  . Chronic diastolic CHF (congestive heart failure) (HCC)   . COPD (chronic obstructive pulmonary disease) (HCC)   . Coronary artery disease   . Diabetes mellitus without complication (HCC)   . Hypertension   . Renal disorder   . GERD (gastroesophageal reflux disease)   . Hyperlipemia   . Anxiety   . Obesity   . CKD (chronic kidney disease), stage III     Medications:  Scheduled:  . aspirin EC  81 mg Oral Daily  . divalproex  1,000 mg Oral QHS  . heparin  5,000 Units Subcutaneous 3 times per day  . norepinephrine      . sertraline  25 mg Oral Daily  . simvastatin  30 mg Oral QHS  . sodium chloride  3 mL Intravenous Q12H    Assessment: Pt is a 81 year old male on CCRT. Pharmacy consulted to monitor patients medications and adjust as needed for CCRT  Goal of Therapy:     Plan:  Currently none of the patients medications needs to be adjusted. Pharmacy to continue to monitor  Billy Liu 10/24/2014,7:44 PM

## 2014-10-24 NOTE — Progress Notes (Signed)
Subjective:   Patient known to our practice from outpatient He is followed for CKD, last seen about a year ago He was d/c'd last week adter admitted for acute on Chronic CHF He was dc with lasix and Kcl D/c cr was 1.35 (10/9) Admit Cr today is 3.40/BUN 126/ K critically elevated at 7.1 Patient was bradycardic and is requiring cardiac pacer Nephrology consult for emergent dialysis  Objective:  Vital signs in last 24 hours:  Pulse Rate:  [40-76] 70 (10/17 1730) Resp:  [7-22] 14 (10/17 1730) BP: (56-155)/(39-137) 65/51 mmHg (10/17 1730) SpO2:  [84 %-100 %] 93 % (10/17 1730)  Weight change:  There were no vitals filed for this visit.  Intake/Output:   No intake or output data in the 24 hours ending 10/24/14 1751   Physical Exam: General: NAD, laying in bed  HEENT anicteric  Neck supple  Pulm/lungs B/l crackles  CVS/Heart rpaced rhythm  Abdomen:  Soft, distended, Manuel Garcia edema  Extremities: +++ edema  Neurologic: alert  Skin: No rashes  Access:        Basic Metabolic Panel:   Recent Labs Lab 10/24/14 1607  NA 132*  K 7.1*  CL 101  CO2 24  GLUCOSE 80  BUN 126*  CREATININE 3.40*  CALCIUM 8.3*     CBC:  Recent Labs Lab 10/24/14 1607  WBC 6.6  HGB 9.3*  HCT 28.8*  MCV 89.3  PLT 118*      Microbiology:  Recent Results (from the past 720 hour(s))  MRSA PCR Screening     Status: None   Collection Time: 10/12/14  5:25 AM  Result Value Ref Range Status   MRSA by PCR NEGATIVE NEGATIVE Final    Comment:        The GeneXpert MRSA Assay (FDA approved for NASAL specimens only), is one component of a comprehensive MRSA colonization surveillance program. It is not intended to diagnose MRSA infection nor to guide or monitor treatment for MRSA infections.     Coagulation Studies: No results for input(s): LABPROT, INR in the last 72 hours.  Urinalysis: No results for input(s): COLORURINE, LABSPEC, PHURINE, GLUCOSEU, HGBUR, BILIRUBINUR, KETONESUR,  PROTEINUR, UROBILINOGEN, NITRITE, LEUKOCYTESUR in the last 72 hours.  Invalid input(s): APPERANCEUR    Imaging: Dg Chest Portable 1 View  10/24/2014  CLINICAL DATA:  Shortness of Breath EXAM: PORTABLE CHEST - 1 VIEW COMPARISON:  10/11/2014 FINDINGS: Cardiac shadow is mildly enlarged. Mild vascular congestion is noted as well small pleural effusions bilaterally. The overall appearance is not significantly change from the prior study. No new focal abnormality is seen. IMPRESSION: Vascular congestion small pleural effusions. Electronically Signed   By: Alcide Clever M.D.   On: 10/24/2014 16:43     Medications:   . calcium gluconate 1 GM IV Stopped (10/24/14 1804)  . glucagon (GLUCAGEN) infusion (for beta blocker/calcium channel blocker overdose) Stopped (10/24/14 1745)   . norepinephrine         Assessment/ Plan:  81 y.o. male with h/o CKD, CHF, COPD, CAD, DM with CKD, GERD presents with Shortness of brath from [facility]. Worseninig edema, Non rebreather, bradycardic, required cardiac Pacer  1. ARF, likely from concurrent illness - patient is hypotensive and fluid overload - foley placement in ER was unsuccessful x 1 - try different cathter  2. Hyperkalemia - affecting cardiac rhythm 3. Anasarca - recent echo with normal EF, diastolic dysfunctino  Plan: Emergent Dialysis in the form of CRRT in the ICU Use 2 K bath UF  as tolerated Case discussed with ER physician, Patient's brother Fayrene FearingJames and Dr Enedina FinnerSona Patel Patient's brother has agreed to dialysis in ICU     LOS:  Nanna Ertle 10/17/20165:51 PM

## 2014-10-24 NOTE — ED Notes (Addendum)
[facility] Per report, EMS called out for shortness of breath. Pt was given Lasix prior to transfer. Pt has had a 8lbs weight gain in the past week. On arrival, pt minimally responsive, rales throughout. Placed on Non Rebreather, sats improved to 90%. Pt was given Fentanyl 25mcg and Atropine 1 en route.  Pt was placed on pacer (arrives at 70bpm/17200milliamps) en route.  BP now 134 palpated by EMS. Pt responds to verbal. Dr. Langston MaskerShaevitz to the bedside for evaluation on arrival.

## 2014-10-25 DIAGNOSIS — N178 Other acute kidney failure: Secondary | ICD-10-CM

## 2014-10-25 DIAGNOSIS — E875 Hyperkalemia: Secondary | ICD-10-CM

## 2014-10-25 DIAGNOSIS — N4883 Acquired buried penis: Secondary | ICD-10-CM

## 2014-10-25 DIAGNOSIS — N433 Hydrocele, unspecified: Secondary | ICD-10-CM

## 2014-10-25 LAB — RENAL FUNCTION PANEL
ALBUMIN: 3.4 g/dL — AB (ref 3.5–5.0)
ANION GAP: 3 — AB (ref 5–15)
ANION GAP: 6 (ref 5–15)
Albumin: 3.4 g/dL — ABNORMAL LOW (ref 3.5–5.0)
Albumin: 3.4 g/dL — ABNORMAL LOW (ref 3.5–5.0)
Anion gap: 3 — ABNORMAL LOW (ref 5–15)
BUN: 90 mg/dL — ABNORMAL HIGH (ref 6–20)
BUN: 79 mg/dL — ABNORMAL HIGH (ref 6–20)
BUN: 67 mg/dL — ABNORMAL HIGH (ref 6–20)
CALCIUM: 8.4 mg/dL — AB (ref 8.9–10.3)
CALCIUM: 8.4 mg/dL — AB (ref 8.9–10.3)
CHLORIDE: 104 mmol/L (ref 101–111)
CO2: 29 mmol/L (ref 22–32)
CO2: 28 mmol/L (ref 22–32)
CO2: 28 mmol/L (ref 22–32)
CREATININE: 2.68 mg/dL — AB (ref 0.61–1.24)
Calcium: 8.3 mg/dL — ABNORMAL LOW (ref 8.9–10.3)
Chloride: 103 mmol/L (ref 101–111)
Chloride: 101 mmol/L (ref 101–111)
Creatinine, Ser: 2.88 mg/dL — ABNORMAL HIGH (ref 0.61–1.24)
Creatinine, Ser: 2.57 mg/dL — ABNORMAL HIGH (ref 0.61–1.24)
GFR calc Af Amer: 22 mL/min — ABNORMAL LOW (ref >60)
GFR calc non Af Amer: 19 mL/min — ABNORMAL LOW (ref >60)
GFR calc non Af Amer: 22 mL/min — ABNORMAL LOW (ref >60)
GFR, EST AFRICAN AMERICAN: 24 mL/min — AB (ref >60)
GFR, EST AFRICAN AMERICAN: 26 mL/min — AB (ref >60)
GFR, EST NON AFRICAN AMERICAN: 21 mL/min — AB (ref >60)
GLUCOSE: 92 mg/dL (ref 65–99)
Glucose, Bld: 50 mg/dL — ABNORMAL LOW (ref 65–99)
Glucose, Bld: 111 mg/dL — ABNORMAL HIGH (ref 65–99)
PHOSPHORUS: 4.8 mg/dL — AB (ref 2.5–4.6)
POTASSIUM: 5.5 mmol/L — AB (ref 3.5–5.1)
Phosphorus: 5.1 mg/dL — ABNORMAL HIGH (ref 2.5–4.6)
Phosphorus: 4.8 mg/dL — ABNORMAL HIGH (ref 2.5–4.6)
Potassium: 6.4 mmol/L — ABNORMAL HIGH (ref 3.5–5.1)
Potassium: 6.6 mmol/L (ref 3.5–5.1)
SODIUM: 135 mmol/L (ref 135–145)
SODIUM: 135 mmol/L (ref 135–145)
Sodium: 135 mmol/L (ref 135–145)

## 2014-10-25 LAB — MAGNESIUM
MAGNESIUM: 2.6 mg/dL — AB (ref 1.7–2.4)
MAGNESIUM: 2.5 mg/dL — AB (ref 1.7–2.4)
MAGNESIUM: 2.4 mg/dL (ref 1.7–2.4)
Magnesium: 2.4 mg/dL (ref 1.7–2.4)

## 2014-10-25 LAB — MRSA PCR SCREENING: MRSA BY PCR: POSITIVE — AB

## 2014-10-25 LAB — GLUCOSE, CAPILLARY
GLUCOSE-CAPILLARY: 31 mg/dL — AB (ref 65–99)
GLUCOSE-CAPILLARY: 53 mg/dL — AB (ref 65–99)
GLUCOSE-CAPILLARY: 108 mg/dL — AB (ref 65–99)
GLUCOSE-CAPILLARY: 99 mg/dL (ref 65–99)
GLUCOSE-CAPILLARY: 108 mg/dL — AB (ref 65–99)
Glucose-Capillary: 78 mg/dL (ref 65–99)
Glucose-Capillary: 89 mg/dL (ref 65–99)

## 2014-10-25 LAB — TSH: TSH: 3.488 u[IU]/mL (ref 0.350–4.500)

## 2014-10-25 LAB — BASIC METABOLIC PANEL
ANION GAP: 5 (ref 5–15)
BUN: 86 mg/dL — ABNORMAL HIGH (ref 6–20)
CO2: 29 mmol/L (ref 22–32)
Calcium: 8.3 mg/dL — ABNORMAL LOW (ref 8.9–10.3)
Chloride: 102 mmol/L (ref 101–111)
Creatinine, Ser: 2.91 mg/dL — ABNORMAL HIGH (ref 0.61–1.24)
GFR, EST AFRICAN AMERICAN: 22 mL/min — AB (ref >60)
GFR, EST NON AFRICAN AMERICAN: 19 mL/min — AB (ref >60)
GLUCOSE: 67 mg/dL (ref 65–99)
POTASSIUM: 6.4 mmol/L — AB (ref 3.5–5.1)
Sodium: 136 mmol/L (ref 135–145)

## 2014-10-25 LAB — APTT: APTT: 36 s (ref 24–36)

## 2014-10-25 LAB — TROPONIN I
Troponin I: 0.11 ng/mL — ABNORMAL HIGH (ref <0.031)
Troponin I: 0.42 ng/mL — ABNORMAL HIGH (ref <0.031)

## 2014-10-25 MED ORDER — DEXTROSE 50 % IV SOLN
50.0000 mL | Freq: Once | INTRAVENOUS | Status: AC
  Administered 2014-10-25: 50 mL via INTRAVENOUS

## 2014-10-25 MED ORDER — CHLORHEXIDINE GLUCONATE CLOTH 2 % EX PADS
6.0000 | MEDICATED_PAD | Freq: Every day | CUTANEOUS | Status: DC
  Administered 2014-10-26 – 2014-10-29 (×4): 6 via TOPICAL

## 2014-10-25 MED ORDER — LIDOCAINE HCL 2 % EX GEL
1.0000 "application " | Freq: Once | CUTANEOUS | Status: AC
  Administered 2014-10-25: 1 via URETHRAL
  Filled 2014-10-25: qty 5

## 2014-10-25 MED ORDER — DEXTROSE 50 % IV SOLN
50.0000 mL | Freq: Once | INTRAVENOUS | Status: AC
  Administered 2014-10-25: 50 mL via INTRAVENOUS
  Filled 2014-10-25: qty 50

## 2014-10-25 MED ORDER — MUPIROCIN 2 % EX OINT
1.0000 "application " | TOPICAL_OINTMENT | Freq: Two times a day (BID) | CUTANEOUS | Status: AC
  Administered 2014-10-25 – 2014-10-29 (×10): 1 via NASAL
  Filled 2014-10-25 (×2): qty 22

## 2014-10-25 MED ORDER — DEXTROSE 50 % IV SOLN
INTRAVENOUS | Status: AC
  Filled 2014-10-25: qty 50

## 2014-10-25 MED ORDER — CETYLPYRIDINIUM CHLORIDE 0.05 % MT LIQD
7.0000 mL | Freq: Two times a day (BID) | OROMUCOSAL | Status: DC
  Administered 2014-10-25 – 2014-10-31 (×12): 7 mL via OROMUCOSAL
  Filled 2014-10-25: qty 7

## 2014-10-25 MED ORDER — PUREFLOW DIALYSIS SOLUTION
INTRAVENOUS | Status: DC
  Administered 2014-10-25: 3 via INTRAVENOUS_CENTRAL
  Administered 2014-10-25: 1 via INTRAVENOUS_CENTRAL
  Administered 2014-10-25 – 2014-10-26 (×2): 3 via INTRAVENOUS_CENTRAL

## 2014-10-25 MED ORDER — INSULIN ASPART 100 UNIT/ML ~~LOC~~ SOLN
0.0000 [IU] | SUBCUTANEOUS | Status: DC
  Administered 2014-10-26 (×2): 2 [IU] via SUBCUTANEOUS
  Administered 2014-10-26 – 2014-10-27 (×2): 1 [IU] via SUBCUTANEOUS
  Administered 2014-10-27: 2 [IU] via SUBCUTANEOUS
  Administered 2014-10-27 – 2014-10-28 (×2): 1 [IU] via SUBCUTANEOUS
  Administered 2014-10-28: 3 [IU] via SUBCUTANEOUS
  Administered 2014-10-28 (×2): 2 [IU] via SUBCUTANEOUS
  Administered 2014-10-28: 1 [IU] via SUBCUTANEOUS
  Administered 2014-10-29: 2 [IU] via SUBCUTANEOUS
  Administered 2014-10-29: 7 [IU] via SUBCUTANEOUS
  Administered 2014-10-29: 2 [IU] via SUBCUTANEOUS
  Administered 2014-10-29: 1 [IU] via SUBCUTANEOUS
  Administered 2014-10-30 (×2): 2 [IU] via SUBCUTANEOUS
  Administered 2014-10-30: 3 [IU] via SUBCUTANEOUS
  Administered 2014-10-30 – 2014-10-31 (×2): 5 [IU] via SUBCUTANEOUS
  Administered 2014-10-31 (×2): 2 [IU] via SUBCUTANEOUS
  Filled 2014-10-25: qty 2
  Filled 2014-10-25: qty 5
  Filled 2014-10-25 (×6): qty 2
  Filled 2014-10-25: qty 1
  Filled 2014-10-25: qty 3
  Filled 2014-10-25: qty 1
  Filled 2014-10-25: qty 2
  Filled 2014-10-25: qty 1
  Filled 2014-10-25: qty 5
  Filled 2014-10-25: qty 2
  Filled 2014-10-25: qty 1
  Filled 2014-10-25: qty 7
  Filled 2014-10-25: qty 2
  Filled 2014-10-25: qty 3
  Filled 2014-10-25 (×2): qty 1
  Filled 2014-10-25: qty 2

## 2014-10-25 NOTE — Progress Notes (Signed)
[facility]  SUBJECTIVE: No complaints.  Heart rate improved.   Filed Vitals:   10/25/14 1030 10/25/14 1100 10/25/14 1130 10/25/14 1230  BP: 115/76 112/56 122/62 105/82  Pulse: 59 61 62 61  Temp: 96.1 F (35.6 C) 96.3 F (35.7 C)  96.1 F (35.6 C)  TempSrc:      Resp: 21 21 22 17   Height:      Weight:      SpO2: 100% 100% 100% 100%    Intake/Output Summary (Last 24 hours) at 10/25/14 1243 Last data filed at 10/25/14 1200  Gross per 24 hour  Intake 805.62 ml  Output   1460 ml  Net -654.38 ml    LABS: Basic Metabolic Panel:  Recent Labs  40/98/1110/18/16 0200 10/25/14 0512 10/25/14 1035  NA 135 136 135  K 6.4* 6.4* 6.6*  CL 103 102 104  CO2 29 29 28   GLUCOSE 50* 67 111*  BUN 90* 86* 79*  CREATININE 2.88* 2.91* 2.68*  CALCIUM 8.4* 8.3* 8.4*  MG 2.6* 2.5* 2.4  PHOS 5.1*  --  4.8*   Liver Function Tests:  Recent Labs  10/25/14 0200 10/25/14 1035  ALBUMIN 3.4* 3.4*   No results for input(s): LIPASE, AMYLASE in the last 72 hours. CBC:  Recent Labs  10/24/14 1607 10/24/14 2200  WBC 6.6 5.8  HGB 9.3* 9.6*  HCT 28.8* 30.4*  MCV 89.3 89.0  PLT 118* 90*   Cardiac Enzymes:  Recent Labs  10/24/14 1607 10/25/14 0200 10/25/14 0512  TROPONINI <0.03 0.11* 0.42*   BNP: Invalid input(s): POCBNP D-Dimer: No results for input(s): DDIMER in the last 72 hours. Hemoglobin A1C: No results for input(s): HGBA1C in the last 72 hours. Fasting Lipid Panel: No results for input(s): CHOL, HDL, LDLCALC, TRIG, CHOLHDL, LDLDIRECT in the last 72 hours. Thyroid Function Tests: No results for input(s): TSH, T4TOTAL, T3FREE, THYROIDAB in the last 72 hours.  Invalid input(s): FREET3 Anemia Panel: No results for input(s): VITAMINB12, FOLATE, FERRITIN, TIBC, IRON, RETICCTPCT in the last 72 hours.   Physical Exam: Blood pressure 105/82, pulse 61, temperature 96.1 F (35.6 C), temperature source Rectal, resp. rate 17, height 6\' 3"  (1.905  m), weight 134.3 kg (296 lb 1.2 oz), SpO2 100 %.  General appearance: cooperative Head: Normocephalic, without obvious abnormality, atraumatic Resp: diminished breath sounds bibasilar Cardio: regular rate and rhythm GI: soft, non-tender; bowel sounds normal; no masses,  no organomegaly Extremities: edema 4+ bilateral lower extremity Neurologic: Mental status: alertness: alert  TELEMETRY: Reviewed telemetry pt in   Currently sinus rhythm in the 60s:  ASSESSMENT AND PLAN:  Active Problems:   Acute hyperkalemia - receiving hemodialysis.  Potassium is improved.  Heart rate is improved.  The will turn off external pacemaker.  With the pads in place for now however patient is in sinus rhythm with heart rates in the 60s.  Would continue to hold the beta-blocker and clonidine.  Will wean Levophed as tolerated and continue dialysis.  Will add anti hypertensives as needed.  Bradycardia likely was secondary to high dose beta blockers and clonidine as well as renal failure and hypokalemia.  Does not appear to require temporary or permanent pacemaker at present.    Dalia HeadingFATH,[place] A., MD, [facility] CenterFACC 10/25/2014 12:43 PM

## 2014-10-25 NOTE — Progress Notes (Signed)
Notified Dr. Anne HahnWillis of pt now being on 140mA  To maintain hr of 70.  Dr. Anne HahnWillis came up to unit to examine pt. In person.  Per Dr. Anne HahnWillis, continue to monitor.

## 2014-10-25 NOTE — Progress Notes (Signed)
Attempted to place foley catheter per Dr. Doristine ChurchSingh's notes.  Attempt unsuccessful.  Catheter kept coiling in penis.  No urine noted at this time.

## 2014-10-25 NOTE — Consult Note (Signed)
Reason for Consult: Difficult Foley, Acute Renal Failure, Scrotal Swelling / Likely Hydrocele  Referring Physician: Florence, Antonelli is an 81 y.o. male.   HPI:   1 - Difficult Foley - difficult foley placement noted by [place] ICU team as need for accurate UOP monitoring in setting of ARF / hyperkalemia. 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 No h/o urinary retention. No baseline voiding complaints. No h/o GU surgery. 37F foley placed today.  2 -  Acute Renal Failure - Cr 3's up from baseline around 1.3 noted during hospitalization for CHF exacerbation. Now with hyperkalemia and on CRRT. No h/o urinary retention. Pt still voiding, but difficult to quantitate. No recent GU imaging for review.  3 -  Scrotal Swelling / Buried Penis / Likely Hydrocele - completely buried penis from large / tense scrotal swelling likely Rt>Lt hydroceles. No bother at baseline.  Today "Griffyn" is seen as urgent consult for above. He is referred by ICU team MD's.   Past Medical History  Diagnosis Date  . Chronic diastolic CHF (congestive heart failure) ([place])   . COPD (chronic obstructive pulmonary disease) ([place])   . Coronary artery disease   . Diabetes mellitus without complication ([place])   . Hypertension   . Renal disorder   . GERD (gastroesophageal reflux disease)   . Hyperlipemia   . Anxiety   . Obesity   . CKD (chronic kidney disease), stage III     History reviewed. No pertinent past surgical history.  No family history on file.  Social History:  reports that he has never smoked. He has quit using smokeless tobacco. His smokeless tobacco use included Chew. He reports that he does not drink alcohol. His drug history is not on file.  Allergies:  Allergies  Allergen Reactions  . Penicillins Other (See Comments)    Reaction:  Unknown     Medications: I have reviewed the patient's current medications.  Results for orders placed or performed during the  hospital encounter of 10/24/14 (from the past 48 hour(s))  Basic metabolic panel     Status: Abnormal   Collection Time: 10/24/14  4:07 PM  Result Value Ref Range   Sodium 132 (L) 135 - 145 mmol/L   Potassium 7.1 (HH) 3.5 - 5.1 mmol/L    Comment: CRITICAL RESULT CALLED TO, READ BACK BY AND VERIFIED WITH GREG MOYER AT 1640 10/24/14 BY [facility]    Chloride 101 101 - 111 mmol/L   CO2 24 22 - 32 mmol/L   Glucose, Bld 80 65 - 99 mg/dL   BUN 126 (H) 6 - 20 mg/dL   Creatinine, Ser 3.40 (H) 0.61 - 1.24 mg/dL   Calcium 8.3 (L) 8.9 - 10.3 mg/dL   GFR calc non Af Amer 16 (L) >60 mL/min   GFR calc Af Amer 18 (L) >60 mL/min    Comment: (NOTE) The eGFR has been calculated using the CKD EPI equation. This calculation has not been validated in all clinical situations. eGFR's persistently <60 mL/min signify possible Chronic Kidney Disease.    Anion gap 7 5 - 15  CBC     Status: Abnormal   Collection Time: 10/24/14  4:07 PM  Result Value Ref Range   WBC 6.6 3.8 - 10.6 K/uL   RBC 3.23 (L) 4.40 - 5.90 MIL/uL   Hemoglobin 9.3 (L) 13.0 - 18.0 g/dL   HCT 28.8 (L) 40.0 - 52.0 %   MCV 89.3 80.0 - 100.0 fL  MCH 28.7 26.0 - 34.0 pg   MCHC 32.2 32.0 - 36.0 g/dL   RDW 17.4 (H) 11.5 - 14.5 %   Platelets 118 (L) 150 - 440 K/uL  Troponin I     Status: None   Collection Time: 10/24/14  4:07 PM  Result Value Ref Range   Troponin I <0.03 <0.031 ng/mL    Comment:        NO INDICATION OF MYOCARDIAL INJURY.   Brain natriuretic peptide     Status: Abnormal   Collection Time: 10/24/14  4:07 PM  Result Value Ref Range   B Natriuretic Peptide 295.0 (H) 0.0 - 100.0 pg/mL  Valproic acid level     Status: Abnormal   Collection Time: 10/24/14  4:07 PM  Result Value Ref Range   Valproic Acid Lvl 40 (L) 50.0 - 100.0 ug/mL  Renal function     Status: Abnormal   Collection Time: 10/24/14  4:07 PM  Result Value Ref Range   Sodium 133 (L) 135 - 145 mmol/L   Potassium NOT VALID 3.5 - 5.1 mmol/L    Comment: HEMOLYSIS  AT THIS LEVEL MAY AFFECT RESULT RESULTS VERIFIED BY REPEAT TESTING [facility] CALLED RENEE BABB @ 10/24/14    Chloride 100 (L) 101 - 111 mmol/L   CO2 23 22 - 32 mmol/L   Glucose, Bld 82 65 - 99 mg/dL   BUN 110 (H) 6 - 20 mg/dL   Creatinine, Ser 3.42 (H) 0.61 - 1.24 mg/dL   Calcium 8.3 (L) 8.9 - 10.3 mg/dL   Phosphorus 7.1 (H) 2.5 - 4.6 mg/dL   Albumin 3.5 3.5 - 5.0 g/dL   GFR calc non Af Amer 16 (L) >60 mL/min   GFR calc Af Amer 18 (L) >60 mL/min    Comment: (NOTE) The eGFR has been calculated using the CKD EPI equation. This calculation has not been validated in all clinical situations. eGFR's persistently <60 mL/min signify possible Chronic Kidney Disease.    Anion gap 10 5 - 15  Magnesium     Status: Abnormal   Collection Time: 10/24/14  4:07 PM  Result Value Ref Range   Magnesium 2.8 (H) 1.7 - 2.4 mg/dL  Glucose, capillary     Status: Abnormal   Collection Time: 10/24/14  4:47 PM  Result Value Ref Range   Glucose-Capillary 111 (H) 65 - 99 mg/dL  Renal function     Status: Abnormal   Collection Time: 10/24/14  9:03 PM  Result Value Ref Range   Sodium 134 (L) 135 - 145 mmol/L   Potassium 6.6 (HH) 3.5 - 5.1 mmol/L    Comment: CRITICAL RESULT CALLED TO, READ BACK BY AND VERIFIED WITH RENEE BABB AT 2224 10/24/14 BY [facility]    Chloride 101 101 - 111 mmol/L   CO2 27 22 - 32 mmol/L   Glucose, Bld 77 65 - 99 mg/dL   BUN 101 (H) 6 - 20 mg/dL   Creatinine, Ser 3.36 (H) 0.61 - 1.24 mg/dL   Calcium 8.3 (L) 8.9 - 10.3 mg/dL   Phosphorus 6.5 (H) 2.5 - 4.6 mg/dL   Albumin 3.5 3.5 - 5.0 g/dL   GFR calc non Af Amer 16 (L) >60 mL/min   GFR calc Af Amer 18 (L) >60 mL/min    Comment: (NOTE) The eGFR has been calculated using the CKD EPI equation. This calculation has not been validated in all clinical situations. eGFR's persistently <60 mL/min signify possible Chronic Kidney Disease.    Anion gap 6 5 -  15  Magnesium     Status: Abnormal   Collection Time: 10/24/14 10:00 PM  Result Value  Ref Range   Magnesium 2.7 (H) 1.7 - 2.4 mg/dL  CBC     Status: Abnormal   Collection Time: 10/24/14 10:00 PM  Result Value Ref Range   WBC 5.8 3.8 - 10.6 K/uL   RBC 3.42 (L) 4.40 - 5.90 MIL/uL   Hemoglobin 9.6 (L) 13.0 - 18.0 g/dL   HCT 30.4 (L) 40.0 - 52.0 %   MCV 89.0 80.0 - 100.0 fL   MCH 28.1 26.0 - 34.0 pg   MCHC 31.5 (L) 32.0 - 36.0 g/dL   RDW 17.1 (H) 11.5 - 14.5 %   Platelets 90 (L) 150 - 440 K/uL  Creatinine, serum     Status: Abnormal   Collection Time: 10/24/14 10:00 PM  Result Value Ref Range   Creatinine, Ser 3.11 (H) 0.61 - 1.24 mg/dL   GFR calc non Af Amer 17 (L) >60 mL/min   GFR calc Af Amer 20 (L) >60 mL/min    Comment: (NOTE) The eGFR has been calculated using the CKD EPI equation. This calculation has not been validated in all clinical situations. eGFR's persistently <60 mL/min signify possible Chronic Kidney Disease.   MRSA PCR Screening     Status: Abnormal   Collection Time: 10/24/14 10:00 PM  Result Value Ref Range   MRSA by PCR POSITIVE (A) NEGATIVE    Comment: CRITICAL RESULT CALLED TO, READ BACK BY AND VERIFIED WITH: RENEE BABBS ON 10/25/14 AT 0049 BY TB.        The GeneXpert MRSA Assay (FDA approved for NASAL specimens only), is one component of a comprehensive MRSA colonization surveillance program. It is not intended to diagnose MRSA infection nor to guide or monitor treatment for MRSA infections.   Renal function     Status: Abnormal   Collection Time: 10/25/14  2:00 AM  Result Value Ref Range   Sodium 135 135 - 145 mmol/L   Potassium 6.4 (H) 3.5 - 5.1 mmol/L   Chloride 103 101 - 111 mmol/L   CO2 29 22 - 32 mmol/L   Glucose, Bld 50 (L) 65 - 99 mg/dL   BUN 90 (H) 6 - 20 mg/dL   Creatinine, Ser 2.88 (H) 0.61 - 1.24 mg/dL   Calcium 8.4 (L) 8.9 - 10.3 mg/dL   Phosphorus 5.1 (H) 2.5 - 4.6 mg/dL   Albumin 3.4 (L) 3.5 - 5.0 g/dL   GFR calc non Af Amer 19 (L) >60 mL/min   GFR calc Af Amer 22 (L) >60 mL/min    Comment: (NOTE) The eGFR  has been calculated using the CKD EPI equation. This calculation has not been validated in all clinical situations. eGFR's persistently <60 mL/min signify possible Chronic Kidney Disease.    Anion gap 3 (L) 5 - 15  Magnesium     Status: Abnormal   Collection Time: 10/25/14  2:00 AM  Result Value Ref Range   Magnesium 2.6 (H) 1.7 - 2.4 mg/dL  Troponin I     Status: Abnormal   Collection Time: 10/25/14  2:00 AM  Result Value Ref Range   Troponin I 0.11 (H) <0.031 ng/mL    Comment: READ BACK AND VERIFIED BY RENEE BABB _0  10/25/14.Marland KitchenMarland KitchenAJO        PERSISTENTLY INCREASED TROPONIN VALUES IN THE RANGE OF 0.04-0.49 ng/mL CAN BE SEEN IN:       -UNSTABLE ANGINA       -CONGESTIVE HEART  FAILURE       -MYOCARDITIS       -CHEST TRAUMA       -ARRYHTHMIAS       -LATE PRESENTING MYOCARDIAL INFARCTION       -COPD   CLINICAL FOLLOW-UP RECOMMENDED.   Glucose, capillary     Status: Abnormal   Collection Time: 10/25/14  2:37 AM  Result Value Ref Range   Glucose-Capillary 31 (LL) 65 - 99 mg/dL  Glucose, capillary     Status: None   Collection Time: 10/25/14  3:48 AM  Result Value Ref Range   Glucose-Capillary 78 65 - 99 mg/dL  Magnesium     Status: Abnormal   Collection Time: 10/25/14  5:12 AM  Result Value Ref Range   Magnesium 2.5 (H) 1.7 - 2.4 mg/dL  APTT     Status: None   Collection Time: 10/25/14  5:12 AM  Result Value Ref Range   aPTT 36 24 - 36 seconds  Basic metabolic panel     Status: Abnormal   Collection Time: 10/25/14  5:12 AM  Result Value Ref Range   Sodium 136 135 - 145 mmol/L   Potassium 6.4 (H) 3.5 - 5.1 mmol/L   Chloride 102 101 - 111 mmol/L   CO2 29 22 - 32 mmol/L   Glucose, Bld 67 65 - 99 mg/dL   BUN 86 (H) 6 - 20 mg/dL   Creatinine, Ser 2.91 (H) 0.61 - 1.24 mg/dL   Calcium 8.3 (L) 8.9 - 10.3 mg/dL   GFR calc non Af Amer 19 (L) >60 mL/min   GFR calc Af Amer 22 (L) >60 mL/min    Comment: (NOTE) The eGFR has been calculated using the CKD EPI equation. This  calculation has not been validated in all clinical situations. eGFR's persistently <60 mL/min signify possible Chronic Kidney Disease.    Anion gap 5 5 - 15  Troponin I     Status: Abnormal   Collection Time: 10/25/14  5:12 AM  Result Value Ref Range   Troponin I 0.42 (H) <0.031 ng/mL    Comment: RESULTS PREVIOUSLY CALLED 10/25/14 _0  BY AJO...AJO        PERSISTENTLY INCREASED TROPONIN VALUES IN THE RANGE OF 0.04-0.49 ng/mL CAN BE SEEN IN:       -UNSTABLE ANGINA       -CONGESTIVE HEART FAILURE       -MYOCARDITIS       -CHEST TRAUMA       -ARRYHTHMIAS       -LATE PRESENTING MYOCARDIAL INFARCTION       -COPD   CLINICAL FOLLOW-UP RECOMMENDED.   Glucose, capillary     Status: Abnormal   Collection Time: 10/25/14  6:01 AM  Result Value Ref Range   Glucose-Capillary 53 (L) 65 - 99 mg/dL  Glucose, capillary     Status: Abnormal   Collection Time: 10/25/14  6:47 AM  Result Value Ref Range   Glucose-Capillary 108 (H) 65 - 99 mg/dL  Glucose, capillary     Status: None   Collection Time: 10/25/14  7:27 AM  Result Value Ref Range   Glucose-Capillary 99 65 - 99 mg/dL  Renal function     Status: Abnormal   Collection Time: 10/25/14 10:35 AM  Result Value Ref Range   Sodium 135 135 - 145 mmol/L   Potassium 6.6 (HH) 3.5 - 5.1 mmol/L    Comment: CRITICAL RESULT CALLED TO, READ BACK BY AND VERIFIED WITH  BRITTANY RUDD AT 1144 ON 10/25/14 WDM  Chloride 104 101 - 111 mmol/L   CO2 28 22 - 32 mmol/L   Glucose, Bld 111 (H) 65 - 99 mg/dL   BUN 79 (H) 6 - 20 mg/dL   Creatinine, Ser 2.68 (H) 0.61 - 1.24 mg/dL   Calcium 8.4 (L) 8.9 - 10.3 mg/dL   Phosphorus 4.8 (H) 2.5 - 4.6 mg/dL   Albumin 3.4 (L) 3.5 - 5.0 g/dL   GFR calc non Af Amer 21 (L) >60 mL/min   GFR calc Af Amer 24 (L) >60 mL/min    Comment: (NOTE) The eGFR has been calculated using the CKD EPI equation. This calculation has not been validated in all clinical situations. eGFR's persistently <60 mL/min signify possible  Chronic Kidney Disease.    Anion gap 3 (L) 5 - 15  Magnesium     Status: None   Collection Time: 10/25/14 10:35 AM  Result Value Ref Range   Magnesium 2.4 1.7 - 2.4 mg/dL  Glucose, capillary     Status: None   Collection Time: 10/25/14 11:37 AM  Result Value Ref Range   Glucose-Capillary 89 65 - 99 mg/dL    Dg Chest Portable 1 View  10/24/2014  CLINICAL DATA:  Shortness of Breath EXAM: PORTABLE CHEST - 1 VIEW COMPARISON:  10/11/2014 FINDINGS: Cardiac shadow is mildly enlarged. Mild vascular congestion is noted as well small pleural effusions bilaterally. The overall appearance is not significantly change from the prior study. No new focal abnormality is seen. IMPRESSION: Vascular congestion small pleural effusions. Electronically Signed   By: Inez Catalina M.D.   On: 10/24/2014 16:43    Review of Systems  Constitutional: Negative.   HENT: Negative.   Eyes: Negative.   Respiratory: Positive for cough and wheezing.   Cardiovascular: Negative.   Gastrointestinal: Negative.  Negative for nausea and vomiting.  Genitourinary: Negative for urgency, frequency, hematuria and flank pain.  Musculoskeletal: Negative.   Skin: Negative.   Neurological: Negative.   Endo/Heme/Allergies: Negative.   Psychiatric/Behavioral: Negative.    Blood pressure 122/62, pulse 62, temperature 96.3 F (35.7 C), temperature source Rectal, resp. rate 22, height _0  (1.905 m), weight 296 lb 1.2 oz (134.3 kg), SpO2 100 %. Physical Exam  Constitutional: He appears well-developed and well-nourished.  Some stigmata of likely prior CVA. Family at bedside, Very cooperative. On CRRT via Rt groin line.   HENT:  Head: Normocephalic.  Eyes: Pupils are equal, round, and reactive to light.  Neck: Normal range of motion.  Cardiovascular: Normal rate.   Respiratory: Effort normal.  Some wheezes noted on [place] O2  GI: Soft.  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  Musculoskeletal: Normal range of  motion.  Neurological: He is alert.  Skin: Skin is warm.  Psychiatric: He has a normal mood and affect. His behavior is normal.    Using aseptic technique pressure placed on lower mons to expose buried penis. 42F foley then placed with 10cc H2O in balloon with immediate efflux about 100cc clear urine. Pt voided about 300cc clear urine prior to foley placement in my presence.   Assessment/Plan:  1 - Difficult Foley - due to body habitus / buried penis / hydrocele. Now placed. Can be removed any anypoint not needed for accurate UOP monitoring.   2 -  Acute Renal Failure - suspect pre-renal etiology in setting of vigorous diuresis. Consider renal US to r/o sig hydro.   3 -  Scrotal Swelling / Buried Penis / Likely Hydrocele - appears chronic, minimal bother at  baseline. Consider furhter w/u in elective setting, no indications for further eval this admission.  4 - Will follow prn. Call with questions anytime.   Farran Amsden 10/25/2014, 12:21 PM

## 2014-10-25 NOTE — Progress Notes (Signed)
Dr. Thedore MinsSingh at bedside and RN made MD aware that patient was bladder scanned and result was >78671ml but that patient has a lot of edema.  RN also made MD aware that ED and ICU RN attempted Foley catheter placement last night, RN asked about placing coude.  MD gave order to place coude catheter but to only attempt once.

## 2014-10-25 NOTE — Progress Notes (Signed)
Attempted to insert #16 Coude catheter using sterile technique. Scrotum enlarged and hard. Catheter kept coiling. Unable to insert coude. Pus noted on tip of catheter when removed.

## 2014-10-25 NOTE — Progress Notes (Signed)
Dr. Lady GaryFath in room and assessed patient and pace maker. MD turned pacemaker off and now only monitoring patient. Dr. Lady GaryFath stated "if he starts dropping his rate then you can turn it back on." Continuing to monitor. Sinus brady rate 57 per cardiac monitor.

## 2014-10-25 NOTE — Clinical Documentation Improvement (Signed)
Internal Medicine  Please clarify if the following diagnosis, documented in the op note , CARDIAC ARREST  was:   Present at the time of admission (POA)  NOT present at the time of admission and it developed during the inpatient stay  Unable to clinically determine whether the condition was present on admission.  Unknown   Supporting Information:  temporary external pacer placed    Please exercise your independent, professional judgment when responding. A specific answer is not anticipated or expected.   Thank You,  Lavonda JumboLawanda J Nashayla [facility] [place] [phone]

## 2014-10-25 NOTE — Progress Notes (Signed)
Rested on and off during shift. Blood pressure drops when patient's sleeps. A&O with incomprehensible speech at times. Denies pain. Foley inserted by urology this shift with good urine output. Heart rate and rhythm maintaining independently in sinus brady with 1st degree heart block.  Pacer off since this a.m. Family visited during shift.

## 2014-10-25 NOTE — Consult Note (Addendum)
Pharmacy Consult for adjust meds for CCRT Indication: CCRT  Allergies  Allergen Reactions  . Penicillins Other (See Comments)    Reaction:  Unknown     Patient Measurements: Height:  (190.5 cm) Weight: 296 lb 1.2 oz (134.3 kg) IBW/kg (Calculated) : 84.5 Adjusted Body Weight:   Vital Signs: Temp: 96.1 F (35.6 C) (10/18 1030) Temp Source: Rectal (10/18 0600) BP: 115/76 mmHg (10/18 1030) Pulse Rate: 59 (10/18 1030) Intake/Output from previous day: 10/17 0701 - 10/18 0700 In: 441.7 [I.V.:441.7] Out: 981  Intake/Output from this shift: Total I/O In: 330.1 [P.O.:240; I.V.:90.1] Out: 192 [Other:192]  Labs:  Recent Labs  10/24/14 1607 10/24/14 2103 10/24/14 2200 10/25/14 0200 10/25/14 0512  WBC 6.6  --  5.8  --   --   HGB 9.3*  --  9.6*  --   --   HCT 28.8*  --  30.4*  --   --   PLT 118*  --  90*  --   --   APTT  --   --   --   --  36  CREATININE 3.42*  3.40* 3.36* 3.11* 2.88* 2.91*  MG 2.8*  --  2.7* 2.6* 2.5*  PHOS 7.1* 6.5*  --  5.1*  --   ALBUMIN 3.5 3.5  --  3.4*  --    Estimated Creatinine Clearance: 29.9 mL/min (by C-G formula based on Cr of 2.91).   Microbiology: Recent Results (from the past 720 hour(s))  MRSA PCR Screening     Status: None   Collection Time: 10/12/14  5:25 AM  Result Value Ref Range Status   MRSA by PCR NEGATIVE NEGATIVE Final    Comment:        The GeneXpert MRSA Assay (FDA approved for NASAL specimens only), is one component of a comprehensive MRSA colonization surveillance program. It is not intended to diagnose MRSA infection nor to guide or monitor treatment for MRSA infections.   MRSA PCR Screening     Status: Abnormal   Collection Time: 10/24/14 10:00 PM  Result Value Ref Range Status   MRSA by PCR POSITIVE (A) NEGATIVE Final    Comment: CRITICAL RESULT CALLED TO, READ BACK BY AND VERIFIED WITH: RENEE BABBS ON 10/25/14 AT 0049 BY TB.        The GeneXpert MRSA Assay (FDA approved for NASAL specimens only),  is one component of a comprehensive MRSA colonization surveillance program. It is not intended to diagnose MRSA infection nor to guide or monitor treatment for MRSA infections.     Medical History: Past Medical History  Diagnosis Date  . Chronic diastolic CHF (congestive heart failure) ([facility])   . COPD (chronic obstructive pulmonary disease) ([facility])   . Coronary artery disease   . Diabetes mellitus without complication ([facility])   . Hypertension   . Renal disorder   . GERD (gastroesophageal reflux disease)   . Hyperlipemia   . Anxiety   . Obesity   . CKD (chronic kidney disease), stage III     Medications:  Scheduled:  . antiseptic oral rinse  7 mL Mouth Rinse BID  . aspirin EC  81 mg Oral Daily  . Chlorhexidine Gluconate Cloth  6 each Topical Q0600  . divalproex  1,000 mg Oral QHS  . heparin  5,000 Units Subcutaneous 3 times per day  . insulin aspart  0-9 Units Subcutaneous 6 times per day  . mupirocin ointment  1 application Nasal BID  . sertraline  25 mg Oral Daily  .  simvastatin  30 mg Oral QHS  . sodium chloride  3 mL Intravenous Q12H    Assessment: Pt is a 81 year old male with acute on chronic renal failure requiring CRRT. Pharmacy consulted to monitor patients medications and adjust as needed for CRRT.   Plan:  Currently none of the patients medications needs to be adjusted. Pharmacy to continue to monitor.  Luisa Harthristy, Dotsie Gillette D 10/25/2014,11:15 AM

## 2014-10-25 NOTE — Progress Notes (Signed)
RN notified Dr. Lady GaryFath that it looks like the pace maker is spiking on the twave and RN is concerned about R on T.  Blood pressure stable and patient alert and talking.  MD ordered for RN to turn pacer rate down to 30 and now patient in sinus brady rate 59 per cardiac monitor after pacemaker rate turned down. MD coming to room to assess patient and pace maker.

## 2014-10-25 NOTE — Progress Notes (Signed)
[facility]  Daily Progress Note   Subjective  - insertion right femoral dialysis catheter  Patient is much more alert he is sitting up however he is mumbling and responds to questions and very difficult to understand he denies pain  Objective Filed Vitals:   10/25/14 1800 10/25/14 1815 10/25/14 1830 10/25/14 1845  BP: 108/49 127/77 129/56 124/57  Pulse: 62 63 61 62  Temp: 95.5 F (35.3 C) 95.5 F (35.3 C) 95.5 F (35.3 C) 95.5 F (35.3 C)  TempSrc:      Resp: 17 25 16 16   Height:      Weight:      SpO2: 100% 98% 100% 100%    Intake/Output Summary (Last 24 hours) at 10/25/14 1904 Last data filed at 10/25/14 1816  Gross per 24 hour  Intake 1364.52 ml  Output   2692 ml  Net -1327.48 ml    PULM  Normal effort , no use of accessory muscles CV  No JVD, RRR Abd      No distended, nontender VASC  right femoral catheter clean dry and intact with excellent function currently on CRT right foot pink and warm with 3+ edema pedal pulses not palpable  Laboratory CBC    Component Value Date/Time   WBC 5.8 10/24/2014 2200   WBC 5.9 07/04/2013 1030   HGB 9.6* 10/24/2014 2200   HGB 11.5* 07/04/2013 1030   HCT 30.4* 10/24/2014 2200   HCT 35.5* 07/04/2013 1030   PLT 90* 10/24/2014 2200   PLT 177 07/04/2013 1030    BMET    Component Value Date/Time   NA 135 10/25/2014 1534   NA 133* 07/04/2013 1030   K 5.5* 10/25/2014 1534   K 4.3 07/04/2013 1030   CL 101 10/25/2014 1534   CL 98 07/04/2013 1030   CO2 28 10/25/2014 1534   CO2 29 07/04/2013 1030   GLUCOSE 92 10/25/2014 1534   GLUCOSE 83 07/04/2013 1030   BUN 67* 10/25/2014 1534   BUN 21* 07/04/2013 1030   CREATININE 2.57* 10/25/2014 1534   CREATININE 1.81* 07/04/2013 1030   CALCIUM 8.3* 10/25/2014 1534   CALCIUM 8.7 07/04/2013 1030   GFRNONAA 22* 10/25/2014 1534   GFRNONAA 35* 07/04/2013 1030   GFRAA 26* 10/25/2014 1534   GFRAA 40* 07/04/2013 1030    Assessment/Planning: POD #1 s/p  insertion of dialysis catheter  1. ARF, likely from concurrent illness  - Will continue CRT for the time being overall he is improved         Tolerating. Will follow for the need for more permanent dialysis access - Continue CRRT for now until hemodynamic parameters improved  2. Hyperkalemia - affecting cardiac rhythm - Improving slowly - Changed to 0K bath - We'll change back to 2K once potassium is in normal range  3. Anasarca  - recent echo with normal EF, diastolic dysfunction  Renford DillsSchnier, Bernadean Saling G  10/25/2014, 7:04 PM

## 2014-10-25 NOTE — Progress Notes (Signed)
Dr. Lady GaryFath present and turned rate down to 40 on pacer. MD gave order for 12 lead EKG.

## 2014-10-25 NOTE — Progress Notes (Signed)
Subjective:   Ration was started on CRRT last night Appreciate vascular surgery assistance in placement of dialysis catheter He appears much improved as compared to last evening Tolerating CRRT well Potassium level is still high Patient is more alert. Eating breakfast this morning when seen Lab parameters are improving including BUN, creatinine and phosphorus UF rate currently at 100 cc per hour  Objective:  Vital signs in last 24 hours:  Temp:  [89.6 F (32 C)-96.4 F (35.8 C)] 96.1 F (35.6 C) (10/18 1230) Pulse Rate:  [30-139] 61 (10/18 1230) Resp:  [7-63] 17 (10/18 1230) BP: (56-155)/(39-137) 105/82 mmHg (10/18 1230) SpO2:  [84 %-100 %] 100 % (10/18 1230) Weight:  [134.3 kg (296 lb 1.2 oz)-134.4 kg (296 lb 4.8 oz)] 134.3 kg (296 lb 1.2 oz) (10/18 0500)  Weight change:  Filed Weights   10/24/14 2000 10/25/14 0500  Weight: 134.4 kg (296 lb 4.8 oz) 134.3 kg (296 lb 1.2 oz)    Intake/Output:    Intake/Output Summary (Last 24 hours) at 10/25/14 1255 Last data filed at 10/25/14 1200  Gross per 24 hour  Intake 805.62 ml  Output   1460 ml  Net -654.38 ml     Physical Exam: General: NAD, laying in bed  HEENT anicteric  Neck supple  Pulm/lungs B/l crackles  CVS/Heart rpaced rhythm  Abdomen:  Soft, distended, [place] edema  Extremities: +++ edema  Neurologic: alert  Skin: No rashes  Access:        Basic Metabolic Panel:   Recent Labs Lab 10/24/14 1607 10/24/14 2103 10/24/14 2200 10/25/14 0200 10/25/14 0512 10/25/14 1035  NA 133*  132* 134*  --  135 136 135  K NOT VALID  7.1* 6.6*  --  6.4* 6.4* 6.6*  CL 100*  101 101  --  103 102 104  CO2 --  GLUCOSE 82  80 77  --  50* 67 111*  BUN 110*  126* 101*  --  90* 86* 79*  CREATININE 3.42*  3.40* 3.36* 3.11* 2.88* 2.91* 2.68*  CALCIUM 8.3*  8.3* 8.3*  --  8.4* 8.3* 8.4*  MG 2.8*  --  2.7* 2.6* 2.5* 2.4  PHOS 7.1* 6.5*  --  5.1*  --  4.8*     CBC:  Recent Labs Lab  10/24/14 1607 10/24/14 2200  WBC 6.6 5.8  HGB 9.3* 9.6*  HCT 28.8* 30.4*  MCV 89.3 89.0  PLT 118* 90*      Microbiology:  Recent Results (from the past 720 hour(s))  MRSA PCR Screening     Status: None   Collection Time: 10/12/14  5:25 AM  Result Value Ref Range Status   MRSA by PCR NEGATIVE NEGATIVE Final    Comment:        The GeneXpert MRSA Assay (FDA approved for NASAL specimens only), is one component of a comprehensive MRSA colonization surveillance program. It is not intended to diagnose MRSA infection nor to guide or monitor treatment for MRSA infections.   MRSA PCR Screening     Status: Abnormal   Collection Time: 10/24/14 10:00 PM  Result Value Ref Range Status   MRSA by PCR POSITIVE (A) NEGATIVE Final    Comment: CRITICAL RESULT CALLED TO, READ BACK BY AND VERIFIED WITH: RENEE BABBS ON 10/25/14 AT 0049 BY TB.        The GeneXpert MRSA Assay (FDA approved for NASAL specimens only), is one component of a comprehensive MRSA colonization  surveillance program. It is not intended to diagnose MRSA infection nor to guide or monitor treatment for MRSA infections.     Coagulation Studies: No results for input(s): LABPROT, INR in the last 72 hours.  Urinalysis: No results for input(s): COLORURINE, LABSPEC, PHURINE, GLUCOSEU, HGBUR, BILIRUBINUR, KETONESUR, PROTEINUR, UROBILINOGEN, NITRITE, LEUKOCYTESUR in the last 72 hours.  Invalid input(s): APPERANCEUR    Imaging: Dg Chest Portable 1 View  10/24/2014  CLINICAL DATA:  Shortness of Breath EXAM: PORTABLE CHEST - 1 VIEW COMPARISON:  10/11/2014 FINDINGS: Cardiac shadow is mildly enlarged. Mild vascular congestion is noted as well small pleural effusions bilaterally. The overall appearance is not significantly change from the prior study. No new focal abnormality is seen. IMPRESSION: Vascular congestion small pleural effusions. Electronically Signed   By: Alcide CleverMark  Lukens M.D.   On: 10/24/2014 16:43      Medications:   . norepinephrine 2 mcg/min (10/25/14 1236)  . pureflow 3 each (10/25/14 0900)   . antiseptic oral rinse  7 mL Mouth Rinse BID  . aspirin EC  81 mg Oral Daily  . Chlorhexidine Gluconate Cloth  6 each Topical Q0600  . divalproex  1,000 mg Oral QHS  . heparin  5,000 Units Subcutaneous 3 times per day  . insulin aspart  0-9 Units Subcutaneous 6 times per day  . mupirocin ointment  1 application Nasal BID  . sertraline  25 mg Oral Daily  . simvastatin  30 mg Oral QHS  . sodium chloride  3 mL Intravenous Q12H     Assessment/ Plan:  81 y.o. male with h/o CKD, CHF, COPD, CAD, DM with CKD, GERD [facility] [place] edema, Non rebreather, bradycardic, required cardiac Pacer  1. ARF, likely from concurrent illness - Patient currently undergoing CRRT. Tolerating well - Continue CRRT for now until hemodynamic parameters improved  2. Hyperkalemia - affecting cardiac rhythm - Improving slowly - Changed to 0K bath - We'll change back to 2K once potassium is in normal range  3. Anasarca - recent echo with normal EF, diastolic dysfunction      LOS: 1 Lio Wehrly 10/18/201612:55 PM

## 2014-10-25 NOTE — Progress Notes (Signed)
[facility] [place] at [facility]   PATIENT NAME: Billy Liu    MR#:  [id number]  DATE OF BIRTH:  [birth year]  SUBJECTIVE:  CHIEF COMPLAINT:   Chief Complaint  Patient presents with  . Bradycardia   Patient is 81 year old African-American male with history of runny diastolic CHF. CK D stage III, COPD, hyperlipidemia, obesity who presents to the hospital with complaints of generalized weakness as well as shortness of breath and weight gain. On arrival to emergency room, he was noted to be hypoxic with O2 sats in 80s and hypotensive with systolic blood pressure in 60s. He was noted to be bradycardic with heart rate in 30s to 40s. He was started on external pacemaker and Levophed. His labs revealed elevated potassium level of 7.1, and creatinine level of 3.4. Because of severe bradycardia and the critically high potassium level and acute renal failure. Patient had hemodialysis catheter placed and initiated on CRRT. Today, feels good. Denies any significant shortness of breath. Patient's potassium level still remains high at 6.6. However, he is off external pacemaker. Bladder scan revealed approximately 700-800 cc of fluid. Foley catheter was attempted to be placed, however, unsuccessful. Urologist was consulted. Patient remains on 5 mics of Levophed. Echocardiogram done at [facility] October 2016 revealed ejection fraction of 55%. Right ventricle systolic pressure of 71.2 mmHg, moderate tricuspid regurgitation Review of Systems  Constitutional: Negative for fever, chills and weight loss.  HENT: Negative for congestion.   Eyes: Negative for blurred vision and double vision.  Respiratory: Negative for cough, sputum production, shortness of breath and wheezing.   Cardiovascular: Negative for chest pain, palpitations, orthopnea, leg swelling and PND.  Gastrointestinal: Negative for nausea, vomiting, abdominal pain, diarrhea, constipation and blood in stool.  Genitourinary:  Negative for dysuria, urgency, frequency and hematuria.  Musculoskeletal: Negative for falls.  Neurological: Negative for dizziness, tremors, focal weakness and headaches.  Endo/Heme/Allergies: Does not bruise/bleed easily.  Psychiatric/Behavioral: Negative for depression. The patient does not have insomnia.     VITAL SIGNS: Blood pressure 104/51, pulse 62, temperature 95 F (35 C), temperature source Rectal, resp. rate 19, height  (1.905 m), weight 134.3 kg (296 lb 1.2 oz), SpO2 100 %.  PHYSICAL EXAMINATION:   GENERAL:  81 y.o.-year-old patient sitting in the bed with no acute distress.  EYES: Pupils equal, round, reactive to light and accommodation. No scleral icterus. Extraocular muscles intact.  HEENT: Head atraumatic, normocephalic. Oropharynx and nasopharynx clear.  NECK:  Supple, no jugular venous distention. No thyroid enlargement, no tenderness.  LUNGS: Diminished at bases breath sounds bilaterally, no wheezing, few rales,rhonchi or crepitation at bases. No use of accessory muscles of respiration.  CARDIOVASCULAR: S1, S2 normal. No murmurs, rubs, or gallops. Rhythm was regular ABDOMEN: Soft, nontender, nondistended. Bowel sounds present. No organomegaly or mass.  EXTREMITIES: No pedal edema, cyanosis, or clubbing. Right femoral hemodialysis catheter NEUROLOGIC: Cranial nerves II through XII are intact. Muscle strength 5/5 in all extremities. Sensation intact. Gait not checked.  PSYCHIATRIC: The patient is alert and oriented x 3.  SKIN: No obvious rash, lesion, or ulcer. 3+ lower extremity edema extending to abdominal area  ORDERS/RESULTS REVIEWED:   CBC  Recent Labs Lab 10/24/14 1607 10/24/14 2200  WBC 6.6 5.8  HGB 9.3* 9.6*  HCT 28.8* 30.4*  PLT 118* 90*  MCV 89.3 89.0  MCH 28.7 28.1  MCHC 32.2 31.5*  RDW 17.4* 17.1*   ------------------------------------------------------------------------------------------------------------------  Chemistries   Recent  Labs Lab 10/24/14 1607 10/24/14 2103  10/24/14 2200 10/25/14 0200 10/25/14 0512 10/25/14 1035  NA 133*  132* 134*  --  135 136 135  K NOT VALID  7.1* 6.6*  --  6.4* 6.4* 6.6*  CL 100*  101 101  --  103 102 104  CO2 23  24 27   --  29 29 28   GLUCOSE 82  80 77  --  50* 67 111*  BUN 110*  126* 101*  --  90* 86* 79*  CREATININE 3.42*  3.40* 3.36* 3.11* 2.88* 2.91* 2.68*  CALCIUM 8.3*  8.3* 8.3*  --  8.4* 8.3* 8.4*  MG 2.8*  --  2.7* 2.6* 2.5* 2.4   ------------------------------------------------------------------------------------------------------------------ estimated creatinine clearance is 32.5 mL/min (by C-G formula based on Cr of 2.68). ------------------------------------------------------------------------------------------------------------------ No results for input(s): TSH, T4TOTAL, T3FREE, THYROIDAB in the last 72 hours.  Invalid input(s): FREET3  Cardiac Enzymes  Recent Labs Lab 10/24/14 1607 10/25/14 0200 10/25/14 0512  TROPONINI <0.03 0.11* 0.42*   ------------------------------------------------------------------------------------------------------------------ Invalid input(s): POCBNP ---------------------------------------------------------------------------------------------------------------  RADIOLOGY: Dg Chest Portable 1 View  10/24/2014  CLINICAL DATA:  Shortness of Breath EXAM: PORTABLE CHEST - 1 VIEW COMPARISON:  10/11/2014 FINDINGS: Cardiac shadow is mildly enlarged. Mild vascular congestion is noted as well small pleural effusions bilaterally. The overall appearance is not significantly change from the prior study. No new focal abnormality is seen. IMPRESSION: Vascular congestion small pleural effusions. Electronically Signed   By: Alcide CleverMark  Lukens M.D.   On: 10/24/2014 16:43    EKG:  Orders placed or performed during the hospital encounter of 10/24/14  . ED EKG within 10 minutes  . ED EKG within 10 minutes    ASSESSMENT AND PLAN:  Active  Problems:   Acute hyperkalemia 1. Acute on chronic renal failure, now on CRRT following urinary output, Foley catheter will be placed. We will be watching patient's in's and outs 2. Urinary retention, question of a chronic urology consultation was obtained for Foley catheter placement, following in's and outs, kidney function tests 3. Hyperkalemia, now patient is on CRRT somewhat better since admission 4. Severe symptomatic bradycardia, status post external pacemaker placement and usage, now discontinued, continue patient on Levophed. Following patient's heart rate closely 5. Shock, likely cardiogenic versus distributive, get VQ scan to rule out pulmonary embolism, continue Levophed to keep map at around 65 and above 6. Elevated troponin, likely demand ischemia, cardiologist is following patient along, we may need to initiate heparin intravenously. If VQ scan is high probability for PE 7. Diabetes mellitus with severe hypoglycemia. Continue patient on sliding scale insulin, hold insulin Lantus   Management plans discussed with the patient, family and they are in agreement.   DRUG ALLERGIES:  Allergies  Allergen Reactions  . Penicillins Other (See Comments)    Reaction:  Unknown     CODE STATUS:     Code Status Orders        Start     Ordered   10/24/14 1935  Full code   Continuous     10/24/14 1934    Advance Directive Documentation        Most Recent Value   Type of Advance Directive  Living will   Pre-existing out of facility DNR order (yellow form or pink MOST form)     "MOST" Form in Place?        TOTAL CRITICAL CARE TIME TAKING CARE OF THIS PATIENT: 45 minutes.    Katharina CaperVAICKUTE,Kamaiya Antilla M.D on 10/25/2014 at 3:52 PM  Between 7am to 6pm - Pager -  [phone]  After 6pm go to [email or web address] - password [email or web address] [facility]  [facility]  Office  [phone]  CC: Primary care physician; No primary care provider on file.

## 2014-10-25 NOTE — Progress Notes (Signed)
Spoke to Dr. Thedore MinsSingh about using trialysis catheter for levophed infusion as patient only has peripheral IVs at this time. Per MD, ok to use trialysis catheter for levophed.  Also discussed latest renal function panel with Dr. Thedore MinsSingh; received verbal orders to switch patient to 2K bath if potassium level results within normal range.  Will continue to assess and monitor.

## 2014-10-25 NOTE — Progress Notes (Signed)
Initial Nutrition Assessment    INTERVENTION:  Meals and snacks: Monitor po intake Medical Nutrition Supplement therapy: If unable to meet nutritional needs will add supplement  NUTRITION DIAGNOSIS:   Altered nutrition lab value related to acute illness as evidenced by  (elevated K and renal labs).    GOAL:   Patient will meet greater than or equal to 90% of their needs    MONITOR:    (Energy intake, Electrolyte and renal profile, Anthropometric)  REASON FOR ASSESSMENT:    (renal diet)    ASSESSMENT:      Pt admitted with cardiac arrest, hyperkalemia, ARF. Started on CRRT  Past Medical History  Diagnosis Date  . Chronic diastolic CHF (congestive heart failure) (HCC)   . COPD (chronic obstructive pulmonary disease) (HCC)   . Coronary artery disease   . Diabetes mellitus without complication (HCC)   . Hypertension   . Renal disorder   . GERD (gastroesophageal reflux disease)   . Hyperlipemia   . Anxiety   . Obesity   . CKD (chronic kidney disease), stage III     Current Nutrition: ate 3/4 of breakfast this am  Food/Nutrition-Related History: Pt reports eats well, 3 meals per day prior to admission   Scheduled Medications:  . antiseptic oral rinse  7 mL Mouth Rinse BID  . aspirin EC  81 mg Oral Daily  . Chlorhexidine Gluconate Cloth  6 each Topical Q0600  . divalproex  1,000 mg Oral QHS  . heparin  5,000 Units Subcutaneous 3 times per day  . insulin aspart  0-9 Units Subcutaneous 6 times per day  . lidocaine  1 application Urethral Once  . mupirocin ointment  1 application Nasal BID  . sertraline  25 mg Oral Daily  . simvastatin  30 mg Oral QHS  . sodium chloride  3 mL Intravenous Q12H    Continuous Medications:  . norepinephrine 7 mcg/min (10/25/14 0917)  . pureflow 3 each (10/25/14 0900)     Electrolyte/Renal Profile and Glucose Profile:   Recent Labs Lab 10/24/14 1607 10/24/14 2103 10/24/14 2200 10/25/14 0200 10/25/14 0512  NA 133*   132* 134*  --  135 136  K NOT VALID  7.1* 6.6*  --  6.4* 6.4*  CL 100*  101 101  --  103 102  CO2 23  24 27   --  29 29  BUN 110*  126* 101*  --  90* 86*  CREATININE 3.42*  3.40* 3.36* 3.11* 2.88* 2.91*  CALCIUM 8.3*  8.3* 8.3*  --  8.4* 8.3*  MG 2.8*  --  2.7* 2.6* 2.5*  PHOS 7.1* 6.5*  --  5.1*  --   GLUCOSE 82  80 77  --  50* 67   Protein Profile:  Recent Labs Lab 10/24/14 1607 10/24/14 2103 10/25/14 0200  ALBUMIN 3.5 3.5 3.4*    Gastrointestinal Profile: abdomen distended, taut Last BM: PTA   Nutrition-Focused Physical Exam Findings: Nutrition-Focused physical exam completed. Findings are no fat depletion, no muscle depletion, and moderate edema.       Weight Change: 10 pound weight gain over the past 4 days     Diet Order:  Diet renal with fluid restriction Fluid restriction:: 1200 mL Fluid; Room service appropriate?: Yes; Fluid consistency:: Thin  Skin:   reviewed   Height:   Ht Readings from Last 1 Encounters:  10/24/14 6\' 3"  (1.905 m)    Weight:   Wt Readings from Last 1 Encounters:  10/25/14 296 lb 1.2  oz (134.3 kg)     BMI:  Body mass index is 37.01 kg/(m^2).  Estimated Nutritional Needs:   Kcal:  [id number] (IF 1.0-1.2, AF 1.3) 2190-2628 kcals/d  Protein:  (1.2-1.5 g/kg) 107-134 gm/d  Fluid:  (25-16ml/kg) 2225-2667ml/d  EDUCATION NEEDS:   Education needs no appropriate at this time  MODERATE Care Level  Jawanna Dykman B. Freida Busman, RD, LDN [phone] (pager)

## 2014-10-25 NOTE — Progress Notes (Signed)
Placed call to prime doc to verify order for glucagon 5mg  continuous drip.  Per Dr. Anne HahnWillis, order discontinued.

## 2014-10-25 NOTE — Progress Notes (Signed)
Pt experienced two periods of hypoglycemia during the night.  The first around 2am.  The lab glucose was 50 and CBG was 31.  Elink MD notified and hypoglycemic protocol was put in place.  Pt was given 1amp of D50 and glucose was within normal limits.  The second episode was at 6am.  Lab glucose was 67 and CBG was 53.  Again, one amp of D50 was given to pt.  Will continue to monitor.

## 2014-10-25 NOTE — Progress Notes (Signed)
Dr. Lady GaryFath stated "leave the pacing rate at 40 unless he drops his blood pressure or changes."

## 2014-10-25 NOTE — Progress Notes (Signed)
   10/25/14 1245  Clinical Encounter Type  Visited With Patient;Patient and family together  Visit Type Initial;Spiritual support  Consult/Referral To Chaplain  Spiritual Encounters  Spiritual Needs Literature;Emotional  Stress Factors  Patient Stress Factors None identified  Family Stress Factors Health changes  Chaplain rounded in the unit and offered a compassionate presence and support to the family and patient. I also read scripture to the patient and fell asleep. Chaplain Demeshia Sherburne A. Kanda Deluna Ext. [phone]

## 2014-10-25 NOTE — Progress Notes (Signed)
eLink Physician-Brief Progress Note Patient Name: Billy Liu DOB: 01/06/1933 MRN: [id number]   Date of Service  10/25/2014  HPI/Events of Note  RN notified of hypoglycemia. Likely [facility] hyperkalemia in setting of acute renal failure.  eICU Interventions  Starting hypoglycemia protocol & routine accuchecks q4hr. SSI coverage if necessary.     Intervention Category Major Interventions: Other:  Lawanda CousinsJennings Keelynn Furgerson 10/25/2014, 2:47 AM

## 2014-10-25 NOTE — Progress Notes (Signed)
Burn wounds x2 noted upon removing external pacing pads.  Paged hospitalist and spoke to Dr. Anne HahnWillis.  Received instructions to cleanse and dress wound with petroleum gauze at this time and MD will place Wound Care consult.  Both burn wounds measured, cleansed, and dressed.  Will continue to monitor.

## 2014-10-26 ENCOUNTER — Ambulatory Visit: Payer: Medicare Other

## 2014-10-26 LAB — GLUCOSE, CAPILLARY
GLUCOSE-CAPILLARY: 106 mg/dL — AB (ref 65–99)
GLUCOSE-CAPILLARY: 162 mg/dL — AB (ref 65–99)
GLUCOSE-CAPILLARY: 198 mg/dL — AB (ref 65–99)
GLUCOSE-CAPILLARY: 96 mg/dL (ref 65–99)
Glucose-Capillary: 102 mg/dL — ABNORMAL HIGH (ref 65–99)
Glucose-Capillary: 132 mg/dL — ABNORMAL HIGH (ref 65–99)

## 2014-10-26 LAB — CBC
HEMATOCRIT: 29.9 % — AB (ref 40.0–52.0)
Hemoglobin: 9.5 g/dL — ABNORMAL LOW (ref 13.0–18.0)
MCH: 28.4 pg (ref 26.0–34.0)
MCHC: 31.9 g/dL — AB (ref 32.0–36.0)
MCV: 89.1 fL (ref 80.0–100.0)
PLATELETS: 84 10*3/uL — AB (ref 150–440)
RBC: 3.35 MIL/uL — ABNORMAL LOW (ref 4.40–5.90)
RDW: 17 % — AB (ref 11.5–14.5)
WBC: 4.3 10*3/uL (ref 3.8–10.6)

## 2014-10-26 LAB — COMPREHENSIVE METABOLIC PANEL
ALT: 209 U/L — AB (ref 17–63)
AST: 142 U/L — AB (ref 15–41)
Albumin: 3.6 g/dL (ref 3.5–5.0)
Alkaline Phosphatase: 103 U/L (ref 38–126)
Anion gap: 4 — ABNORMAL LOW (ref 5–15)
BUN: 56 mg/dL — AB (ref 6–20)
CHLORIDE: 101 mmol/L (ref 101–111)
CO2: 30 mmol/L (ref 22–32)
CREATININE: 1.87 mg/dL — AB (ref 0.61–1.24)
Calcium: 8.6 mg/dL — ABNORMAL LOW (ref 8.9–10.3)
GFR calc Af Amer: 37 mL/min — ABNORMAL LOW (ref >60)
GFR calc non Af Amer: 32 mL/min — ABNORMAL LOW (ref >60)
GLUCOSE: 112 mg/dL — AB (ref 65–99)
POTASSIUM: 5.3 mmol/L — AB (ref 3.5–5.1)
SODIUM: 135 mmol/L (ref 135–145)
Total Bilirubin: <0.1 mg/dL — ABNORMAL LOW (ref 0.3–1.2)
Total Protein: 6.4 g/dL — ABNORMAL LOW (ref 6.5–8.1)

## 2014-10-26 LAB — RENAL FUNCTION PANEL
ALBUMIN: 3 g/dL — AB (ref 3.5–5.0)
ALBUMIN: 3.5 g/dL (ref 3.5–5.0)
ANION GAP: 5 (ref 5–15)
ANION GAP: 4 — AB (ref 5–15)
Albumin: 3.6 g/dL (ref 3.5–5.0)
Albumin: 3.2 g/dL — ABNORMAL LOW (ref 3.5–5.0)
Anion gap: 4 — ABNORMAL LOW (ref 5–15)
Anion gap: 4 — ABNORMAL LOW (ref 5–15)
BUN: 40 mg/dL — AB (ref 6–20)
BUN: 45 mg/dL — AB (ref 6–20)
BUN: 50 mg/dL — AB (ref 6–20)
BUN: 57 mg/dL — AB (ref 6–20)
CALCIUM: 8.5 mg/dL — AB (ref 8.9–10.3)
CALCIUM: 8.6 mg/dL — AB (ref 8.9–10.3)
CHLORIDE: 103 mmol/L (ref 101–111)
CHLORIDE: 101 mmol/L (ref 101–111)
CHLORIDE: 101 mmol/L (ref 101–111)
CO2: 29 mmol/L (ref 22–32)
CO2: 30 mmol/L (ref 22–32)
CO2: 30 mmol/L (ref 22–32)
CO2: 31 mmol/L (ref 22–32)
CREATININE: 1.58 mg/dL — AB (ref 0.61–1.24)
Calcium: 8.5 mg/dL — ABNORMAL LOW (ref 8.9–10.3)
Calcium: 8.3 mg/dL — ABNORMAL LOW (ref 8.9–10.3)
Chloride: 101 mmol/L (ref 101–111)
Creatinine, Ser: 1.41 mg/dL — ABNORMAL HIGH (ref 0.61–1.24)
Creatinine, Ser: 1.45 mg/dL — ABNORMAL HIGH (ref 0.61–1.24)
Creatinine, Ser: 1.87 mg/dL — ABNORMAL HIGH (ref 0.61–1.24)
GFR calc Af Amer: 37 mL/min — ABNORMAL LOW (ref >60)
GFR calc Af Amer: 51 mL/min — ABNORMAL LOW (ref >60)
GFR calc non Af Amer: 40 mL/min — ABNORMAL LOW (ref >60)
GFR calc non Af Amer: 44 mL/min — ABNORMAL LOW (ref >60)
GFR, EST AFRICAN AMERICAN: 46 mL/min — AB (ref >60)
GFR, EST AFRICAN AMERICAN: 53 mL/min — AB (ref >60)
GFR, EST NON AFRICAN AMERICAN: 32 mL/min — AB (ref >60)
GFR, EST NON AFRICAN AMERICAN: 45 mL/min — AB (ref >60)
GLUCOSE: 112 mg/dL — AB (ref 65–99)
GLUCOSE: 112 mg/dL — AB (ref 65–99)
GLUCOSE: 99 mg/dL (ref 65–99)
Glucose, Bld: 150 mg/dL — ABNORMAL HIGH (ref 65–99)
PHOSPHORUS: 2.9 mg/dL (ref 2.5–4.6)
PHOSPHORUS: 3.4 mg/dL (ref 2.5–4.6)
PHOSPHORUS: 4.3 mg/dL (ref 2.5–4.6)
POTASSIUM: 4.6 mmol/L (ref 3.5–5.1)
POTASSIUM: 4.8 mmol/L (ref 3.5–5.1)
Phosphorus: 3.9 mg/dL (ref 2.5–4.6)
Potassium: 4.8 mmol/L (ref 3.5–5.1)
Potassium: 5.2 mmol/L — ABNORMAL HIGH (ref 3.5–5.1)
SODIUM: 134 mmol/L — AB (ref 135–145)
SODIUM: 137 mmol/L (ref 135–145)
Sodium: 137 mmol/L (ref 135–145)
Sodium: 135 mmol/L (ref 135–145)

## 2014-10-26 LAB — APTT: APTT: 42 s — AB (ref 24–36)

## 2014-10-26 LAB — MAGNESIUM
MAGNESIUM: 2.1 mg/dL (ref 1.7–2.4)
MAGNESIUM: 2.3 mg/dL (ref 1.7–2.4)
Magnesium: 2.1 mg/dL (ref 1.7–2.4)
Magnesium: 2 mg/dL (ref 1.7–2.4)

## 2014-10-26 MED ORDER — PUREFLOW DIALYSIS SOLUTION
INTRAVENOUS | Status: DC
  Administered 2014-10-26: 3 via INTRAVENOUS_CENTRAL

## 2014-10-26 MED ORDER — DIVALPROEX SODIUM 500 MG PO DR TAB
500.0000 mg | DELAYED_RELEASE_TABLET | Freq: Two times a day (BID) | ORAL | Status: DC
  Administered 2014-10-26 – 2014-10-31 (×10): 500 mg via ORAL
  Filled 2014-10-26 (×2): qty 2
  Filled 2014-10-26 (×3): qty 1
  Filled 2014-10-26: qty 2
  Filled 2014-10-26: qty 1
  Filled 2014-10-26: qty 2
  Filled 2014-10-26 (×4): qty 1

## 2014-10-26 MED ORDER — HYDRALAZINE HCL 20 MG/ML IJ SOLN
10.0000 mg | INTRAMUSCULAR | Status: DC | PRN
  Administered 2014-10-26 – 2014-10-29 (×5): 10 mg via INTRAVENOUS
  Filled 2014-10-26 (×5): qty 1

## 2014-10-26 NOTE — Progress Notes (Signed)
This RN spoke with Dr. Thedore MinsSingh and made him aware that potassium level in normal now.  Dr. Thedore MinsSingh stated "I will change the dialysate to 2K and you can stop the CRRT at 1800 tonight."

## 2014-10-26 NOTE — Progress Notes (Signed)
RN spoke with Dr. Winona LegatoVaickute and made her aware that per Dr. Thedore MinsSingh CRRT will be stopped at 1800 this evening and that if patient goes to vq scan at 2pm then it will be stopped for a 2 hour period of time.  Dr. Winona LegatoVaickute gave verbal order to change vq scan to tomorrow.

## 2014-10-26 NOTE — Progress Notes (Signed)
   10/26/14 0850  Clinical Encounter Type  Visited With Patient  Visit Type Follow-up  Consult/Referral To Chaplain  Spiritual Encounters  Spiritual Needs Emotional  Stress Factors  Patient Stress Factors None identified  Chaplain rounded in the unit and offered a compassionate presence and support as applicable. Chaplain Bassem Bernasconi A. Tam Delisle Ext. [phone]

## 2014-10-26 NOTE — Progress Notes (Signed)
RN spoke with Dr. Thedore MinsSingh via telephone and MD gave order to stop CRRT at this time.

## 2014-10-26 NOTE — Consult Note (Signed)
WOC wound consult note Reason for Consult:Burns to left anterior chest from transcutaneous pacing.  Wound type: Burn from pacer Pressure Ulcer POA: N/A Measurement: left lesion 2.2 cm x 4 cm x 0.1 cm Central lesion (below left areola) 2.2 cm x 1.6 cm x 0.1  Wound RUE:[id number]:devitalized tissue is sloughing off Drainage (amount, consistency, odor) Scant serosanguinous drainage.  No odor.  Periwound:Intact Dressing procedure/placement/frequency:Cleanse burns below left areola with NS and pat gently dry.  Apply petrolatum gauze to wound bed. Cover with 4x4 gauze and secure with tape. Change daily.  Will not follow at this time.  Please re-consult if needed.  Maple HudsonKaren Haru Anspaugh RN BSN CWON Pager [phone]

## 2014-10-26 NOTE — Discharge Instructions (Signed)
Heart Failure Clinic appointment on November 10, 2014 at 11:00am with Clarisa Kindredina Mihran Lebarron, FNP. Please call [phone] to reschedule.

## 2014-10-26 NOTE — Progress Notes (Signed)
   10/26/14 1400  Clinical Encounter Type  Visited With Patient  Visit Type Follow-up  Consult/Referral To Chaplain  Spiritual Encounters  Spiritual Needs Sacred text  Stress Factors  Patient Stress Factors None identified  Chaplain rounded and asked patient if he would like me to read scripture. He said yes and e continued reading Job from the day before. We stopped in Chapter 10. Chaplain Lashondra Vaquerano A. Rhyleigh Grassel Ext. [phone]

## 2014-10-26 NOTE — Consult Note (Signed)
Pharmacy Consult for adjust meds for CCRT Indication: CCRT  Allergies  Allergen Reactions  . Penicillins Other (See Comments)    Reaction:  Unknown     Patient Measurements: Height: 6\' 3"  (190.5 cm) Weight: 284 lb 6.3 oz (129 kg) IBW/kg (Calculated) : 84.5 Adjusted Body Weight:   Vital Signs: Temp: 97.2 F (36.2 C) (10/19 1130) BP: 126/49 mmHg (10/19 1130) Pulse Rate: 78 (10/19 1130) Intake/Output from previous day: 10/18 0701 - 10/19 0700 In: 1571.8 [P.O.:1200; I.V.:371.8] Out: 3564 [Urine:1250] Intake/Output from this shift: Total I/O In: 247.6 [P.O.:240; I.V.:7.6] Out: 489 [Urine:200; Other:289]  Labs:  Recent Labs  10/24/14 1607  10/24/14 2200  10/25/14 0512  10/25/14 2344 10/26/14 0449 10/26/14 0451 10/26/14 0758 10/26/14 1156  WBC 6.6  --  5.8  --   --   --   --  4.3  --   --   --   HGB 9.3*  --  9.6*  --   --   --   --  9.5*  --   --   --   HCT 28.8*  --  30.4*  --   --   --   --  29.9*  --   --   --   PLT 118*  --  90*  --   --   --   --  84*  --   --   --   APTT  --   --   --   --  36  --   --  42*  --   --   --   CREATININE 3.42*  3.40*  < > 3.11*  < > 2.91*  < > 1.87*  1.87*  --  1.58* 1.45*  --   MG 2.8*  --  2.7*  < > 2.5*  < > 2.3  --  2.1 2.1 2.0  PHOS 7.1*  < >  --   < >  --   < > 4.3  --  3.9 3.4  --   ALBUMIN 3.5  < >  --   < >  --   < > 3.6  3.5  --  3.6 3.2*  --   PROT  --   --   --   --   --   --  6.4*  --   --   --   --   AST  --   --   --   --   --   --  142*  --   --   --   --   ALT  --   --   --   --   --   --  209*  --   --   --   --   ALKPHOS  --   --   --   --   --   --  103  --   --   --   --   BILITOT  --   --   --   --   --   --  <0.1*  --   --   --   --   < > = values in this interval not displayed. Estimated Creatinine Clearance: 58.8 mL/min (by C-G formula based on Cr of 1.45).   Microbiology: Recent Results (from the past 720 hour(s))  MRSA PCR Screening     Status: None   Collection Time: 10/12/14  5:25 AM  Result  Value Ref Range  Status   MRSA by PCR NEGATIVE NEGATIVE Final    Comment:        The GeneXpert MRSA Assay (FDA approved for NASAL specimens only), is one component of a comprehensive MRSA colonization surveillance program. It is not intended to diagnose MRSA infection nor to guide or monitor treatment for MRSA infections.   MRSA PCR Screening     Status: Abnormal   Collection Time: 10/24/14 10:00 PM  Result Value Ref Range Status   MRSA by PCR POSITIVE (A) NEGATIVE Final    Comment: CRITICAL RESULT CALLED TO, READ BACK BY AND VERIFIED WITH: RENEE BABBS ON 10/25/14 AT 0049 BY TB.        The GeneXpert MRSA Assay (FDA approved for NASAL specimens only), is one component of a comprehensive MRSA colonization surveillance program. It is not intended to diagnose MRSA infection nor to guide or monitor treatment for MRSA infections.     Medical History: Past Medical History  Diagnosis Date  . Chronic diastolic CHF (congestive heart failure) (HCC)   . COPD (chronic obstructive pulmonary disease) (HCC)   . Coronary artery disease   . Diabetes mellitus without complication (HCC)   . Hypertension   . Renal disorder   . GERD (gastroesophageal reflux disease)   . Hyperlipemia   . Anxiety   . Obesity   . CKD (chronic kidney disease), stage III     Medications:  Scheduled:  . antiseptic oral rinse  7 mL Mouth Rinse BID  . aspirin EC  81 mg Oral Daily  . Chlorhexidine Gluconate Cloth  6 each Topical Q0600  . divalproex  500 mg Oral Q12H  . heparin  5,000 Units Subcutaneous 3 times per day  . insulin aspart  0-9 Units Subcutaneous 6 times per day  . mupirocin ointment  1 application Nasal BID  . sertraline  25 mg Oral Daily  . simvastatin  30 mg Oral QHS  . sodium chloride  3 mL Intravenous Q12H    Assessment: Pt is a 81 year old male with acute on chronic renal failure requiring CRRT. Pharmacy consulted to monitor patients medications and adjust as needed for CRRT.    Plan:  Currently none of the patients medications needs to be adjusted. Pharmacy to continue to monitor.  Billy Liu D 10/26/2014,12:46 PM

## 2014-10-26 NOTE — Progress Notes (Signed)
Patient alert and watching tv. Denies pain. NSR per cardiac monitor. o2 sats upper 90's on room air with no respiratory distress noted. 725cc UOP this shift. CRRT stopped at 1639.  Levophed drip off since 1000 and patient has maintained blood pressure. Bair hugger off at this time because patient maintaining temp around 97 degrees. Good appetite and fed self this shift with set up assist.

## 2014-10-26 NOTE — Progress Notes (Signed)
   10/26/14 1600  Clinical Encounter Type  Visited With Patient  Visit Type Follow-up  Referral From Patient  Consult/Referral To Chaplain  Spiritual Encounters  Spiritual Needs Other (Comment)  Stress Factors  Patient Stress Factors None identified  Chaplain rounded in unit and patient asked for a Dr. Reino KentPepper. Checked ith his nurse tech and I got him a diet cola. He was very Adult nurseappreciative. Chaplain Gracyn Santillanes A. Esdras Delair Ext. [phone]

## 2014-10-26 NOTE — Progress Notes (Signed)
Subjective:   Patient was started on CRRT 10/17 Appreciate vascular surgery assistance in placement of dialysis catheter He appears much improved   Tolerating CRRT well Potassium level has corrected now Patient is more alert. Eating breakfast this morning when seen Lab parameters are improving including BUN, creatinine and phosphorus UF rate currently at 100 cc per hour  Objective:  Vital signs in last 24 hours:  Temp:  [94.3 F (34.6 C)-97 F (36.1 C)] 97 F (36.1 C) (10/19 1000) Pulse Rate:  [55-77] 77 (10/19 1000) Resp:  [11-25] 23 (10/19 1000) BP: (76-159)/(38-94) 139/51 mmHg (10/19 1000) SpO2:  [96 %-100 %] 100 % (10/19 1000) Weight:  [129 kg (284 lb 6.3 oz)] 129 kg (284 lb 6.3 oz) (10/19 0500)  Weight change: -5.4 kg (-11 lb 14.5 oz) Filed Weights   10/24/14 2000 10/25/14 0500 10/26/14 0500  Weight: 134.4 kg (296 lb 4.8 oz) 134.3 kg (296 lb 1.2 oz) 129 kg (284 lb 6.3 oz)    Intake/Output:    Intake/Output Summary (Last 24 hours) at 10/26/14 1030 Last data filed at 10/26/14 1000  Gross per 24 hour  Intake 1489.3 ml  Output   3671 ml  Net -2181.7 ml     Physical Exam: General: NAD, laying in bed  HEENT anicteric  Neck supple  Pulm/lungs B/l crackles  CVS/Heart rpaced rhythm  Abdomen:  Soft, distended, Bowie edema  Extremities: +++ edema  Neurologic: alert  Skin: No rashes  Access:    Foley    Basic Metabolic Panel:   Recent Labs Lab 10/25/14 1035 10/25/14 1534 10/25/14 2344 10/26/14 0451 10/26/14 0758  NA 135 135 135  134* 137 137  K 6.6* 5.5* 5.3*  5.2* 4.8 4.6  CL 104 101 101  101 103 101  CO2 GLUCOSE 111* 92 112*  112* 112* 99  BUN 79* 67* 56*  57* 50* 45*  CREATININE 2.68* 2.57* 1.87*  1.87* 1.58* 1.45*  CALCIUM 8.4* 8.3* 8.6*  8.5* 8.6* 8.5*  MG 2.4 2.4 2.3 2.1 2.1  PHOS 4.8* 4.8* 4.3 3.9 3.4     CBC:  Recent Labs Lab 10/24/14 1607 10/24/14 2200 10/26/14 0449  WBC 6.6 5.8 4.3  HGB 9.3* 9.6* 9.5*   HCT 28.8* 30.4* 29.9*  MCV 89.3 89.0 89.1  PLT 118* 90* 84*      Microbiology:  Recent Results (from the past 720 hour(s))  MRSA PCR Screening     Status: None   Collection Time: 10/12/14  5:25 AM  Result Value Ref Range Status   MRSA by PCR NEGATIVE NEGATIVE Final    Comment:        The GeneXpert MRSA Assay (FDA approved for NASAL specimens only), is one component of a comprehensive MRSA colonization surveillance program. It is not intended to diagnose MRSA infection nor to guide or monitor treatment for MRSA infections.   MRSA PCR Screening     Status: Abnormal   Collection Time: 10/24/14 10:00 PM  Result Value Ref Range Status   MRSA by PCR POSITIVE (A) NEGATIVE Final    Comment: CRITICAL RESULT CALLED TO, READ BACK BY AND VERIFIED WITH: RENEE BABBS ON 10/25/14 AT 0049 BY TB.        The GeneXpert MRSA Assay (FDA approved for NASAL specimens only), is one component of a comprehensive MRSA colonization surveillance program. It is not intended to diagnose MRSA infection nor to guide or monitor treatment for MRSA infections.  Coagulation Studies: No results for input(s): LABPROT, INR in the last 72 hours.  Urinalysis: No results for input(s): COLORURINE, LABSPEC, PHURINE, GLUCOSEU, HGBUR, BILIRUBINUR, KETONESUR, PROTEINUR, UROBILINOGEN, NITRITE, LEUKOCYTESUR in the last 72 hours.  Invalid input(s): APPERANCEUR    Imaging: Dg Chest Portable 1 View  10/24/2014  CLINICAL DATA:  Shortness of Breath EXAM: PORTABLE CHEST - 1 VIEW COMPARISON:  10/11/2014 FINDINGS: Cardiac shadow is mildly enlarged. Mild vascular congestion is noted as well small pleural effusions bilaterally. The overall appearance is not significantly change from the prior study. No new focal abnormality is seen. IMPRESSION: Vascular congestion small pleural effusions. Electronically Signed   By: Alcide CleverMark  Lukens M.D.   On: 10/24/2014 16:43     Medications:   . norepinephrine 1 mcg/min  (10/26/14 0751)  . pureflow 3 each (10/26/14 0130)   . antiseptic oral rinse  7 mL Mouth Rinse BID  . aspirin EC  81 mg Oral Daily  . Chlorhexidine Gluconate Cloth  6 each Topical Q0600  . divalproex  1,000 mg Oral QHS  . heparin  5,000 Units Subcutaneous 3 times per day  . insulin aspart  0-9 Units Subcutaneous 6 times per day  . mupirocin ointment  1 application Nasal BID  . sertraline  25 mg Oral Daily  . simvastatin  30 mg Oral QHS  . sodium chloride  3 mL Intravenous Q12H     Assessment/ Plan:  81 y.o. male with h/o CKD, CHF, COPD, CAD, DM with CKD, GERD [facility]. [place]ors[place] edema, Non rebreather, bradycardic, required cardiac Pacer  1. ARF, likely from concurrent illness - Patient currently undergoing CRRT. Tolerating well - Continue CRRT for now until 6 PM then d/c - will monitor for further need for HD   2. Hyperkalemia - affecting cardiac rhythm - Improving slowly - Changed to 0K bath - We'll change back to 2K    3. Anasarca - recent echo with normal EF, diastolic dysfunction      LOS: 2 Chelisa Hennen 10/19/201610:30 AM

## 2014-10-26 NOTE — Progress Notes (Signed)
[facility] [place] [facility]   PATIENT NAME: Billy Liu    MR#:  [id number]  DATE OF BIRTH:  Jun 20, 1932  SUBJECTIVE:  CHIEF COMPLAINT:   Chief Complaint  Patient presents with  . Bradycardia   Patient is 81 year old African-American male with history of runny diastolic CHF. CK D stage III, COPD, hyperlipidemia, obesity who presents to the hospital with complaints of generalized weakness as well as shortness of breath and weight gain. On arrival to emergency room, he was noted to be hypoxic with O2 sats in 80s and hypotensive with systolic blood pressure in 60s. He was noted to be bradycardic with heart rate in 30s to 40s. He was started on external pacemaker and Levophed. His labs revealed elevated potassium level of 7.1, and creatinine level of 3.4. Because of severe bradycardia and the critically high potassium level and acute renal failure. Patient had hemodialysis catheter placed and initiated on CRRT. Today, feels good. Denies any significant shortness of breath. Patient's potassium level still remains high at 6.6. However, he is off external pacemaker. Bladder scan revealed approximately 700-800 cc of fluid. Foley catheter was attempted to be placed, however, unsuccessful. Urologist was consulted. Patient remains on 5 mics of Levophed. Echocardiogram done at [facility] October 2016 revealed ejection fraction of 55%. Right ventricle systolic pressure of 71.2 mmHg, moderate tricuspid regurgitation Off levophed, makes some urine,  but admits of some shortness of breath, remains on CRRT until 6 PM per nephrologist's recommendations.   Review of Systems  Constitutional: Negative for fever, chills and weight loss.  HENT: Negative for congestion.   Eyes: Negative for blurred vision and double vision.  Respiratory: Positive for shortness of breath. Negative for cough, sputum production and wheezing.   Cardiovascular: Negative for chest pain, palpitations, orthopnea,  leg swelling and PND.  Gastrointestinal: Negative for nausea, vomiting, abdominal pain, diarrhea, constipation and blood in stool.  Genitourinary: Negative for dysuria, urgency, frequency and hematuria.  Musculoskeletal: Negative for falls.  Neurological: Negative for dizziness, tremors, focal weakness and headaches.  Endo/Heme/Allergies: Does not bruise/bleed easily.  Psychiatric/Behavioral: Negative for depression. The patient does not have insomnia.     VITAL SIGNS: Blood pressure 142/57, pulse 81, temperature 97.2 F (36.2 C), temperature source Rectal, resp. rate 19, height  (1.905 m), weight 129 kg (284 lb 6.3 oz), SpO2 99 %.  PHYSICAL EXAMINATION:   GENERAL:  81 y.o.-year-old patient sitting in the bed with no acute distress. Comfortable EYES: Pupils equal, round, reactive to light and accommodation. No scleral icterus. Extraocular muscles intact.  HEENT: Head atraumatic, normocephalic. Oropharynx and nasopharynx clear.  NECK:  Supple, no jugular venous distention. No thyroid enlargement, no tenderness.  LUNGS: Diminished at bases breath sounds bilaterally, no wheezing, few rales,rhonchi or crepitation at bases. No use of accessory muscles of respiration.  CARDIOVASCULAR: S1, S2 normal. No murmurs, rubs, or gallops. Rhythm was regular ABDOMEN: Soft, nontender, nondistended. Bowel sounds present. No organomegaly or mass.  EXTREMITIES: No pedal edema, cyanosis, or clubbing. Right femoral hemodialysis catheter NEUROLOGIC: Cranial nerves II through XII are intact. Muscle strength 5/5 in all extremities. Sensation intact. Gait not checked.  PSYCHIATRIC: The patient is alert and oriented x 3.  SKIN: No obvious rash, lesion, or ulcer. 3+ lower extremity edema extending to abdominal area,  some urine in Foley bag  ORDERS/RESULTS REVIEWED:   CBC  Recent Labs Lab 10/24/14 1607 10/24/14 2200 10/26/14 0449  WBC 6.6 5.8 4.3  HGB 9.3* 9.6* 9.5*  HCT 28.8*  30.4* 29.9*  PLT 118* 90*  84*  MCV 89.3 89.0 89.1  MCH 28.7 28.1 28.4  MCHC 32.2 31.5* 31.9*  RDW 17.4* 17.1* 17.0*   ------------------------------------------------------------------------------------------------------------------  Chemistries   Recent Labs Lab 10/25/14 1534 10/25/14 2344 10/26/14 0451 10/26/14 0758 10/26/14 1156  NA 135 135  134* 137 137 135  K 5.5* 5.3*  5.2* 4.8 4.6 4.8  CL 101 101  101 103 101 101  CO2 28 30  29 30 31 30   GLUCOSE 92 112*  112* 112* 99 150*  BUN 67* 56*  57* 50* 45* 40*  CREATININE 2.57* 1.87*  1.87* 1.58* 1.45* 1.41*  CALCIUM 8.3* 8.6*  8.5* 8.6* 8.5* 8.3*  MG 2.4 2.3 2.1 2.1 2.0  AST  --  142*  --   --   --   ALT  --  209*  --   --   --   ALKPHOS  --  103  --   --   --   BILITOT  --  <0.1*  --   --   --    ------------------------------------------------------------------------------------------------------------------ estimated creatinine clearance is 60.5 mL/min (by C-G formula based on Cr of 1.41). ------------------------------------------------------------------------------------------------------------------  Recent Labs  10/25/14 1534  TSH 3.488    Cardiac Enzymes  Recent Labs Lab 10/24/14 1607 10/25/14 0200 10/25/14 0512  TROPONINI <0.03 0.11* 0.42*   ------------------------------------------------------------------------------------------------------------------ Invalid input(s): POCBNP ---------------------------------------------------------------------------------------------------------------  RADIOLOGY: Dg Chest Portable 1 View  10/24/2014  CLINICAL DATA:  Shortness of Breath EXAM: PORTABLE CHEST - 1 VIEW COMPARISON:  10/11/2014 FINDINGS: Cardiac shadow is mildly enlarged. Mild vascular congestion is noted as well small pleural effusions bilaterally. The overall appearance is not significantly change from the prior study. No new focal abnormality is seen. IMPRESSION: Vascular congestion small pleural effusions.  Electronically Signed   By: Alcide CleverMark  Lukens M.D.   On: 10/24/2014 16:43    EKG:  Orders placed or performed during the hospital encounter of 10/24/14  . ED EKG within 10 minutes  . ED EKG within 10 minutes    ASSESSMENT AND PLAN:  Active Problems:   Acute hyperkalemia 1. Acute on chronic renal failure, now on CRRT to 6 PM , then following urinary output and kidney function for continuous hemodialysis, Foley catheter was placed. watching patient's in's and outs 2. Urinary retention, status post Foley catheter placement,  appreciate urology consultation 3. Hyperkalemia, resolved since patient is on CRRT  4. Severe symptomatic bradycardia, status post external pacemaker placement and usage, now discontinued, likely due to hyperkalemia, now it is old,  off Levophed. Stable patient's heart rate  5. Shock, likely cardiogenic , less likely distributive, get VQ scan to rule out pulmonary embolism tomorrow morning 6. Elevated troponin, likely demand ischemia, cardiologist is following patient, echo at the beginning of October 2060 in the showed normal ejection fraction and severe pulmonary hypertension 7. Diabetes mellitus with severe hypoglycemia, resolved now. Continue patient on sliding scale insulin, holding insulin Lantus, oral intake is satisfactory   Management plans discussed with the patient, family and they are in agreement.   DRUG ALLERGIES:  Allergies  Allergen Reactions  . Penicillins Other (See Comments)    Reaction:  Unknown     CODE STATUS:     Code Status Orders        Start     Ordered   10/24/14 1935  Full code   Continuous     10/24/14 1934    Advance Directive Documentation  Most Recent Value   Type of Advance Directive  Living will   Pre-existing out of facility DNR order (yellow form or pink MOST form)     "MOST" Form in Place?        TOTAL CRITICAL CARE TIME TAKING CARE OF THIS PATIENT: 40 minutes.    Katharina Caper M.D on 10/26/2014 at 4:14  PM  Between 7am to 6pm - Pager - [phone]  After 6pm go to [email or web address] - password [email or web address] [facility]  [facility]  Office  [phone]  CC: Primary care physician; No primary care provider on file.

## 2014-10-26 NOTE — Clinical Social Work Note (Signed)
Clinical Social Work Assessment  Patient Details  Name: Billy Liu MRN: [id number] Date of Birth: 1932-07-08  Date of referral:  10/26/14               Reason for consult:  Facility Placement, Other (Comment Required) (From [place])                Permission sought to share information with:  Chartered certified accountant granted to share information::  Yes, Verbal Permission Granted  Name::      [employer]::   [place]   Relationship::     Contact Information:     Housing/Transportation Living arrangements for the past 2 months:  [place] of Information:  Patient Patient Interpreter Needed:  None Criminal Activity/Legal Involvement Pertinent to Current Situation/Hospitalization:  No - Comment as needed Significant Relationships:  Siblings Lives with:  Facility Resident Do you feel safe going back to the place where you live?  Yes Need for family participation in patient care:  No (Coment)  Care giving concerns: Patient is a long term care resident at [facility].    Social Worker assessment / plan: Holiday representative (CSW) met with patient alone at bedside to discuss D/C plan. Patient was alert and oriented to self and place. CSW introduced self and explained role of CSW department. Patient reported that he has been at [facility] for almost 10 years now. Patient reported that he has no children and his primary support is his brother [name]. Patient is agreeable to return to [facility].   FL2 complete and on chart. Per Neoma Laming admissions coordinator at [facility] patient can return when medically stable.   Employment status:  Disabled (Comment on whether or not currently receiving Disability), Retired Forensic scientist:  Medicaid In [place], [employer] PT Recommendations:  Not assessed at this time Information / Referral to community resources:  [place]  Patient/Family's Response to care: Patient is agreeable to return to [facility].   Patient/Family's Understanding of and Emotional Response to Diagnosis, Current Treatment, and Prognosis: Patient was pleasant throughout assessment and thanked CSW for visit.   Emotional Assessment Appearance:  Appears stated age Attitude/Demeanor/Rapport:    Affect (typically observed):  Accepting, Adaptable, Pleasant Orientation:  Oriented to Self, Oriented to Place, Fluctuating Orientation (Suspected and/or reported Sundowners) Alcohol / Substance use:  Not Applicable Psych involvement (Current and /or in the community):  No (Comment)  Discharge Needs  Concerns to be addressed:  Discharge Planning Concerns Readmission within the last 30 days:  No Current discharge risk:  Chronically ill Barriers to Discharge:  Continued Medical Work up   Loralyn Freshwater, LCSW 10/26/2014, 10:53 AM

## 2014-10-26 NOTE — Progress Notes (Signed)
 Vein and Vascular Surgery  Daily Progress Note   Subjective  - insertion right femoral dialysis catheter  Patient is much more alert he is sitting up however he is mumbling and responds to questions and very difficult to understand he denies pain  Objective Filed Vitals:   10/26/14 1630 10/26/14 1700 10/26/14 1730 10/26/14 1800  BP: 173/66  185/69 172/67  Pulse: 81 85 89 86  Temp: 97 F (36.1 C) 96.8 F (36 C) 97 F (36.1 C) 97 F (36.1 C)  TempSrc:      Resp: 18 23 20 21   Height:      Weight:      SpO2: 100% 95% 95% 96%    Intake/Output Summary (Last 24 hours) at 10/26/14 1816 Last data filed at 10/26/14 1800  Gross per 24 hour  Intake 1256.6 ml  Output   3140 ml  Net -1883.4 ml    PULM  Normal effort , no use of accessory muscles CV  No JVD, RRR Abd      No distended, nontender VASC  right femoral catheter clean dry and intact with excellent function currently on CRT right foot pink and warm with 3+ edema pedal pulses not palpable  Laboratory CBC    Component Value Date/Time   WBC 4.3 10/26/2014 0449   WBC 5.9 07/04/2013 1030   HGB 9.5* 10/26/2014 0449   HGB 11.5* 07/04/2013 1030   HCT 29.9* 10/26/2014 0449   HCT 35.5* 07/04/2013 1030   PLT 84* 10/26/2014 0449   PLT 177 07/04/2013 1030    BMET    Component Value Date/Time   NA 135 10/26/2014 1156   NA 133* 07/04/2013 1030   K 4.8 10/26/2014 1156   K 4.3 07/04/2013 1030   CL 101 10/26/2014 1156   CL 98 07/04/2013 1030   CO2 30 10/26/2014 1156   CO2 29 07/04/2013 1030   GLUCOSE 150* 10/26/2014 1156   GLUCOSE 83 07/04/2013 1030   BUN 40* 10/26/2014 1156   BUN 21* 07/04/2013 1030   CREATININE 1.41* 10/26/2014 1156   CREATININE 1.81* 07/04/2013 1030   CALCIUM 8.3* 10/26/2014 1156   CALCIUM 8.7 07/04/2013 1030   GFRNONAA 45* 10/26/2014 1156   GFRNONAA 35* 07/04/2013 1030   GFRAA 53* 10/26/2014 1156   GFRAA 40* 07/04/2013 1030    Assessment/Planning: POD #2 s/p insertion of dialysis  catheter  1. ARF, likely from concurrent illness  - Will continue CRT for the time being overall he is improved         Tolerating. Will follow for the need for more permanent dialysis access - Continue CRRT for now until hemodynamic parameters improved  2. Hyperkalemia - back to normal K+ -  back to 2K once potassium is in normal range  3. Anasarca  - recent echo with normal EF, diastolic dysfunction  Renford DillsSchnier, Javoris Star G  10/26/2014, 6:16 PM

## 2014-10-26 NOTE — Progress Notes (Signed)
Patient's visitors in room and gentleman visitor complained to RN that no one told him that the patient had MRSA before he came in room. RN explained to gentleman that she did not see him go in room because she was with another patient at that time but that there is a protective precautions caddy on outside of patient's door and that all persons entering room should wear a gown and gloves.  Gentleman and woman both already had gowns and gloves on because they were informed of need to wear protective precautions by Chasity, NT when she delivered patient's food tray.

## 2014-10-27 ENCOUNTER — Inpatient Hospital Stay: Payer: Medicare Other

## 2014-10-27 LAB — BASIC METABOLIC PANEL
Anion gap: 6 (ref 5–15)
BUN: 30 mg/dL — AB (ref 6–20)
CHLORIDE: 102 mmol/L (ref 101–111)
CO2: 29 mmol/L (ref 22–32)
CREATININE: 1.29 mg/dL — AB (ref 0.61–1.24)
Calcium: 8.8 mg/dL — ABNORMAL LOW (ref 8.9–10.3)
GFR calc Af Amer: 59 mL/min — ABNORMAL LOW (ref >60)
GFR calc non Af Amer: 51 mL/min — ABNORMAL LOW (ref >60)
Glucose, Bld: 123 mg/dL — ABNORMAL HIGH (ref 65–99)
Potassium: 5 mmol/L (ref 3.5–5.1)
SODIUM: 137 mmol/L (ref 135–145)

## 2014-10-27 LAB — COMPREHENSIVE METABOLIC PANEL
ALBUMIN: 3.4 g/dL — AB (ref 3.5–5.0)
ALK PHOS: 91 U/L (ref 38–126)
ALT: 142 U/L — AB (ref 17–63)
AST: 72 U/L — ABNORMAL HIGH (ref 15–41)
Anion gap: 5 (ref 5–15)
BUN: 32 mg/dL — ABNORMAL HIGH (ref 6–20)
CALCIUM: 8.8 mg/dL — AB (ref 8.9–10.3)
CO2: 29 mmol/L (ref 22–32)
Chloride: 102 mmol/L (ref 101–111)
Creatinine, Ser: 1.33 mg/dL — ABNORMAL HIGH (ref 0.61–1.24)
GFR calc Af Amer: 57 mL/min — ABNORMAL LOW (ref >60)
GFR calc non Af Amer: 49 mL/min — ABNORMAL LOW (ref >60)
GLUCOSE: 135 mg/dL — AB (ref 65–99)
Potassium: 5 mmol/L (ref 3.5–5.1)
SODIUM: 136 mmol/L (ref 135–145)
Total Bilirubin: 0.4 mg/dL (ref 0.3–1.2)
Total Protein: 6.6 g/dL (ref 6.5–8.1)

## 2014-10-27 LAB — CBC
HCT: 29.1 % — ABNORMAL LOW (ref 40.0–52.0)
HEMOGLOBIN: 9.2 g/dL — AB (ref 13.0–18.0)
MCH: 28.1 pg (ref 26.0–34.0)
MCHC: 31.6 g/dL — ABNORMAL LOW (ref 32.0–36.0)
MCV: 88.9 fL (ref 80.0–100.0)
Platelets: 91 10*3/uL — ABNORMAL LOW (ref 150–440)
RBC: 3.27 MIL/uL — AB (ref 4.40–5.90)
RDW: 16.4 % — ABNORMAL HIGH (ref 11.5–14.5)
WBC: 5.6 10*3/uL (ref 3.8–10.6)

## 2014-10-27 LAB — GLUCOSE, CAPILLARY
GLUCOSE-CAPILLARY: 109 mg/dL — AB (ref 65–99)
GLUCOSE-CAPILLARY: 119 mg/dL — AB (ref 65–99)
GLUCOSE-CAPILLARY: 128 mg/dL — AB (ref 65–99)
Glucose-Capillary: 163 mg/dL — ABNORMAL HIGH (ref 65–99)
Glucose-Capillary: 119 mg/dL — ABNORMAL HIGH (ref 65–99)
Glucose-Capillary: 145 mg/dL — ABNORMAL HIGH (ref 65–99)

## 2014-10-27 LAB — MAGNESIUM: Magnesium: 2 mg/dL (ref 1.7–2.4)

## 2014-10-27 MED ORDER — DOCUSATE SODIUM 50 MG/5ML PO LIQD: 100.0000 mg | Freq: Every day | ORAL | Status: DC

## 2014-10-27 MED ORDER — DOCUSATE SODIUM 100 MG PO CAPS
100.0000 mg | ORAL_CAPSULE | Freq: Two times a day (BID) | ORAL | Status: DC
  Administered 2014-10-27 – 2014-10-31 (×9): 100 mg via ORAL
  Filled 2014-10-27 (×9): qty 1

## 2014-10-27 MED ORDER — AMLODIPINE BESYLATE 10 MG PO TABS
10.0000 mg | ORAL_TABLET | Freq: Every day | ORAL | Status: DC
  Administered 2014-10-27 – 2014-10-31 (×5): 10 mg via ORAL
  Filled 2014-10-27 (×5): qty 1

## 2014-10-27 MED ORDER — SENNOSIDES 8.8 MG/5ML PO SYRP
5.0000 mL | ORAL_SOLUTION | Freq: Two times a day (BID) | ORAL | Status: DC
  Filled 2014-10-27 (×2): qty 5

## 2014-10-27 MED ORDER — TECHNETIUM TO 99M ALBUMIN AGGREGATED
4.6500 | Freq: Once | INTRAVENOUS | Status: AC | PRN
  Administered 2014-10-27: 4.65 via INTRAVENOUS

## 2014-10-27 MED ORDER — TECHNETIUM TC 99M DIETHYLENETRIAME-PENTAACETIC ACID
42.9500 | Freq: Once | INTRAVENOUS | Status: DC | PRN
  Administered 2014-10-27: 42.95 via INTRAVENOUS
  Filled 2014-10-27: qty 42.95

## 2014-10-27 MED ORDER — SENNOSIDES-DOCUSATE SODIUM 8.6-50 MG PO TABS
1.0000 | ORAL_TABLET | Freq: Every day | ORAL | Status: DC
  Administered 2014-10-27 – 2014-10-31 (×5): 1 via ORAL
  Filled 2014-10-27 (×5): qty 1

## 2014-10-27 MED ORDER — CLONIDINE HCL 0.1 MG PO TABS
0.1000 mg | ORAL_TABLET | Freq: Two times a day (BID) | ORAL | Status: DC
  Administered 2014-10-27: 0.1 mg via ORAL
  Filled 2014-10-27: qty 1

## 2014-10-27 NOTE — Progress Notes (Signed)
[facility] -  at [facility]   PATIENT NAME: Billy Liu    MR#:  [id number]  DATE OF BIRTH:  April 16, 1932  SUBJECTIVE:  CHIEF COMPLAINT:   Chief Complaint  Patient presents with  . Bradycardia   Patient is 81 year old African-American male with history of runny diastolic CHF. CK D stage III, COPD, hyperlipidemia, obesity who presents to the hospital with complaints of generalized weakness as well as shortness of breath and weight gain. On arrival to emergency room, he was noted to be hypoxic with O2 sats in 80s and hypotensive with systolic blood pressure in 60s. He was noted to be bradycardic with heart rate in 30s to 40s. He was started on external pacemaker and Levophed. His labs revealed elevated potassium level of 7.1, and creatinine level of 3.4. Because of severe bradycardia and the critically high potassium level and acute renal failure. Patient had hemodialysis catheter placed and initiated on CRRT. Today, feels good. Denies any significant shortness of breath. Patient's potassium level still remains high at 6.6. However, he is off external pacemaker. Bladder scan revealed approximately 700-800 cc of fluid. Foley catheter was attempted to be placed, however, unsuccessful. Urologist was consulted. Patient remains on 5 mics of Levophed. Echocardiogram done at [facility] October 2016 revealed ejection fraction of 55%. Right ventricle systolic pressure of 71.2 mmHg, moderate tricuspid regurgitation Good urine output over the past 24 hours of approximately 2300 cc. Denies any pain or shortness of breath. Good oral intake. Swelling remains significant in periphery, oxygenation is good. O2 sats 96-98% on room air at rest.  Review of Systems  Constitutional: Negative for fever, chills and weight loss.  HENT: Negative for congestion.   Eyes: Negative for blurred vision and double vision.  Respiratory: Positive for shortness of breath. Negative for cough, sputum  production and wheezing.   Cardiovascular: Negative for chest pain, palpitations, orthopnea, leg swelling and PND.  Gastrointestinal: Negative for nausea, vomiting, abdominal pain, diarrhea, constipation and blood in stool.  Genitourinary: Negative for dysuria, urgency, frequency and hematuria.  Musculoskeletal: Negative for falls.  Neurological: Negative for dizziness, tremors, focal weakness and headaches.  Endo/Heme/Allergies: Does not bruise/bleed easily.  Psychiatric/Behavioral: Negative for depression. The patient does not have insomnia.     VITAL SIGNS: Blood pressure 162/62, pulse 96, temperature 98.1 F (36.7 C), temperature source Rectal, resp. rate 19, height  (1.905 m), weight 126.5 kg (278 lb 14.1 oz), SpO2 96 %.  PHYSICAL EXAMINATION:   GENERAL:  81 y.o.-year-old patient sitting in the bed with no acute distress. Comfortable EYES: Pupils equal, round, reactive to light and accommodation. No scleral icterus. Extraocular muscles intact.  HEENT: Head atraumatic, normocephalic. Oropharynx and nasopharynx clear.  NECK:  Supple, no jugular venous distention. No thyroid enlargement, no tenderness.  LUNGS: Diminished at bases breath sounds bilaterally, no wheezing, few rales,rhonchi or crepitation at bases. No use of accessory muscles of respiration.  CARDIOVASCULAR: S1, S2 normal. No murmurs, rubs, or gallops. Rhythm was regular ABDOMEN: Soft, nontender, nondistended. Bowel sounds present. No organomegaly or mass.  EXTREMITIES: No pedal edema, cyanosis, or clubbing. Right femoral hemodialysis catheter NEUROLOGIC: Cranial nerves II through XII are intact. Muscle strength 5/5 in all extremities. Sensation intact. Gait not checked.  PSYCHIATRIC: The patient is alert and oriented x 3.  SKIN: No obvious rash, lesion, or ulcer. Severe perineal abdominal lower extremity edema extending ,   large amount of light colored urine in Foley bag  ORDERS/RESULTS REVIEWED:   CBC  Recent  Labs Lab 10/24/14 1607 10/24/14 2200 10/26/14 0449 10/27/14 0407  WBC 6.6 5.8 4.3 5.6  HGB 9.3* 9.6* 9.5* 9.2*  HCT 28.8* 30.4* 29.9* 29.1*  PLT 118* 90* 84* 91*  MCV 89.3 89.0 89.1 88.9  MCH 28.7 28.1 28.4 28.1  MCHC 32.2 31.5* 31.9* 31.6*  RDW 17.4* 17.1* 17.0* 16.4*   ------------------------------------------------------------------------------------------------------------------  Chemistries   Recent Labs Lab 10/25/14 2344 10/26/14 0451 10/26/14 0758 10/26/14 1156 10/27/14 0407 10/27/14 0753  NA 135  134* 137 137 135 136 137  K 5.3*  5.2* 4.8 4.6 4.8 5.0 5.0  CL 101  101 103 101 101 102 102  CO2 GLUCOSE 112*  112* 112* 99 150* 135* 123*  BUN 56*  57* 50* 45* 40* 32* 30*  CREATININE 1.87*  1.87* 1.58* 1.45* 1.41* 1.33* 1.29*  CALCIUM 8.6*  8.5* 8.6* 8.5* 8.3* 8.8* 8.8*  MG 2.3 2.1 2.1 2.0 2.0  --   AST 142*  --   --   --  72*  --   ALT 209*  --   --   --  142*  --   ALKPHOS 103  --   --   --  91  --   BILITOT <0.1*  --   --   --  0.4  --    ------------------------------------------------------------------------------------------------------------------ estimated creatinine clearance is 65.4 mL/min (by C-G formula based on Cr of 1.29). ------------------------------------------------------------------------------------------------------------------  Recent Labs  10/25/14 1534  TSH 3.488    Cardiac Enzymes  Recent Labs Lab 10/24/14 1607 10/25/14 0200 10/25/14 0512  TROPONINI <0.03 0.11* 0.42*   ------------------------------------------------------------------------------------------------------------------ Invalid input(s): POCBNP ---------------------------------------------------------------------------------------------------------------  RADIOLOGY: Dg Chest Port 1 View  10/27/2014  CLINICAL DATA:  Increase shortness of breath EXAM: PORTABLE CHEST 1 VIEW COMPARISON:  10/24/2014 FINDINGS: Stable enlargement of  cardiac silhouette. Significant pulmonary venous congestion. Significant diffuse hazy attenuation over both lungs. IMPRESSION: Cardiac enlargement, vascular congestion, and moderate bilateral pleural effusions. Hazy attenuation mid to lower lung zones likely reflects a combination of atelectasis and pulmonary edema. Overall appearance is slightly worse when compared to prior study. Electronically Signed   By: Esperanza Heir M.D.   On: 10/27/2014 10:50    EKG:  Orders placed or performed during the hospital encounter of 10/24/14  . ED EKG within 10 minutes  . ED EKG within 10 minutes    ASSESSMENT AND PLAN:  Active Problems:   Acute hyperkalemia 1. Acute on chronic renal failure, now of CRRT , temporary hemodialysis catheter will be removed today , following urinary output and kidney function for assess for need for continuous hemodialysis, Foley catheter is removed. Following patient's in's and outs as well as his weight  2. Urinary retention, status post Foley catheter placement,  appreciate urology consultation, follow urinary output. Replace catheter if needed 3. Hyperkalemia, resolved since patient was started on CRRT  4. Severe symptomatic bradycardia, status post external pacemaker placement and usage, now discontinued, likely due to hyperkalemia,  Stable patient's heart rate  5. Shock, , cardiogenic , less likely distributive, get VQ scan today to rule out pulmonary embolism, although oxygenation is satisfactory 6. Elevated troponin, likely demand ischemia, cardiologist is following patient, echo at the beginning of October 2060 in the showed normal ejection fraction and severe pulmonary hypertension 7. Diabetes mellitus with severe hypoglycemia, resolved now. Continue patient on sliding scale insulin, holding insulin Lantus, oral intake is satisfactory, blood glucose is fluctuating between 110-200, may initiate insulin Lantus  if even higher 8. Generalized weakness. Initiate physical  therapy and transfer patient to  telemetry   Management plans discussed with the patient, family and they are in agreement.   DRUG ALLERGIES:  Allergies  Allergen Reactions  . Penicillins Other (See Comments)    Reaction:  Unknown     CODE STATUS:     Code Status Orders        Start     Ordered   10/24/14 1935  Full code   Continuous     10/24/14 1934    Advance Directive Documentation        Most Recent Value   Type of Advance Directive  Living will   Pre-existing out of facility DNR order (yellow form or pink MOST form)     "MOST" Form in Place?        TOTAL TIME TAKING CARE OF THIS PATIENT: 40 minutes.    Katharina CaperVAICKUTE,Jamear Carbonneau M.D on 10/27/2014 at 2:26 PM  Between 7am to 6pm - Pager - [phone]  After 6pm go to [email or web address] - password [email or web address] [facility]  [facility]  Office  [phone]  CC: Primary care physician; No primary care provider on file.

## 2014-10-27 NOTE — Progress Notes (Signed)
Billy Liu  Daily Progress Note   Subjective  - insertion right femoral dialysis catheter  Patient is alert he is sitting up however his speech is improved  Objective Filed Vitals:   10/27/14 1553 10/27/14 1600 10/27/14 1612 10/27/14 1800  BP: 175/64 163/60  141/49  Pulse:  100 100 90  Temp:    98.4 F (36.9 C)  TempSrc:    Oral  Resp:  25 22 14   Height:      Weight:      SpO2:  100% 96% 91%    Intake/Output Summary (Last 24 hours) at 10/27/14 1842 Last data filed at 10/27/14 1817  Gross per 24 hour  Intake      0 ml  Output   2075 ml  Net  -2075 ml    PULM  Normal effort , no use of accessory muscles CV  No JVD, RRR Abd      No distended, nontender VASC  right femoral catheter clean dry and intact right foot pink and warm with 3+ edema pedal pulses not palpable  Laboratory CBC    Component Value Date/Time   WBC 5.6 10/27/2014 0407   WBC 5.9 07/04/2013 1030   HGB 9.2* 10/27/2014 0407   HGB 11.5* 07/04/2013 1030   HCT 29.1* 10/27/2014 0407   HCT 35.5* 07/04/2013 1030   PLT 91* 10/27/2014 0407   PLT 177 07/04/2013 1030    BMET    Component Value Date/Time   NA 137 10/27/2014 0753   NA 133* 07/04/2013 1030   K 5.0 10/27/2014 0753   K 4.3 07/04/2013 1030   CL 102 10/27/2014 0753   CL 98 07/04/2013 1030   CO2 29 10/27/2014 0753   CO2 29 07/04/2013 1030   GLUCOSE 123* 10/27/2014 0753   GLUCOSE 83 07/04/2013 1030   BUN 30* 10/27/2014 0753   BUN 21* 07/04/2013 1030   CREATININE 1.29* 10/27/2014 0753   CREATININE 1.81* 07/04/2013 1030   CALCIUM 8.8* 10/27/2014 0753   CALCIUM 8.7 07/04/2013 1030   GFRNONAA 51* 10/27/2014 0753   GFRNONAA 35* 07/04/2013 1030   GFRAA 59* 10/27/2014 0753   GFRAA 40* 07/04/2013 1030    Assessment/Planning: POD #2 s/p insertion of dialysis catheter  1. ARF, likely from concurrent illness  - Off CRT for the time being overall he is improved he is making urine         Tolerating. Will follow for  the need for more permanent dialysis access    2. Hyperkalemia - back to normal K+  3. Anasarca  -  Still present recent echo with normal EF, diastolic dysfunction  Schnier, Billy Liu  10/27/2014, 6:42 PM

## 2014-10-27 NOTE — Care Management Note (Signed)
Case Management Note  Patient Details  Name: Montario Wicke MRN: [id number] [id number] Date of Birth: 10/18/1932  Subjective[facility]Objective[facility]   [facility]                 Action/Plan:   Expected Discharge Date:                  Expected Discharge Plan:  Skilled Nursing Facility  In-House Referral:  Clinical Social Work  Discharge planning Services     Post Acute Care Choice:    Choice offered to:     DME Arranged:    DME Agency:     HH Arranged:    HH Agency:     Status of Service:    Medicare Important Message Given:    Date Medicare IM Given:    Medicare IM give by:    Date Additional Medicare IM Given:    Additional Medicare Important Message give by:     If discussed at Long Length of Stay Meetings, dates discussed:    Additional Comments:  Marily MemosLisa M Shalaya Swailes, RN 10/27/2014, 10:47 AM

## 2014-10-27 NOTE — Progress Notes (Signed)
Pts blood pressure systolic 175 despite administration of po Norvasc notified Dr. Winona LegatoVaickute given orders for po Clonidine 0.1 mg twice a day give first dose now

## 2014-10-27 NOTE — Progress Notes (Signed)
Subjective:   Patient was started on CRRT 10/17 Appreciate vascular surgery assistance in placement of dialysis catheter UOP has improved significantly States he is doing well No specific c/o today  Objective:  Vital signs in last 24 hours:  Temp:  [96.8 F (36 C)-98.1 F (36.7 C)] 98.1 F (36.7 C) (10/20 0700) Pulse Rate:  [75-102] 102 (10/20 0630) Resp:  [14-29] 22 (10/20 0700) BP: (105-193)/(43-86) 160/61 mmHg (10/20 0700) SpO2:  [94 %-100 %] 96 % (10/20 0700) Weight:  [126.5 kg (278 lb 14.1 oz)] 126.5 kg (278 lb 14.1 oz) (10/20 0435)  Weight change: -2.5 kg (-5 lb 8.2 oz) Filed Weights   10/25/14 0500 10/26/14 0500 10/27/14 0435  Weight: 134.3 kg (296 lb 1.2 oz) 129 kg (284 lb 6.3 oz) 126.5 kg (278 lb 14.1 oz)    Intake/Output:    Intake/Output Summary (Last 24 hours) at 10/27/14 0846 Last data filed at 10/27/14 0600  Gross per 24 hour  Intake  603.8 ml  Output   3040 ml  Net -2436.2 ml     Physical Exam: General: NAD, laying in bed  HEENT anicteric  Neck supple  Pulm/lungs Normal effort  CVS/Heart rpaced rhythm  Abdomen:  Soft, distended, [place] edema  Extremities: ++ dependent edema  Neurologic: alert  Skin: No rashes  Access:    Foley    Basic Metabolic Panel:   Recent Labs Lab 10/25/14 1534 10/25/14 2344 10/26/14 0451 10/26/14 0758 10/26/14 1156 10/27/14 0407 10/27/14 0753  NA 135 135  134* 137 137 135 136 137  K 5.5* 5.3*  5.2* 4.8 4.6 4.8 5.0 5.0  CL 101 101  101 103 101 101 102 102  CO2 28 30  29 30 31 30 29 29   GLUCOSE 92 112*  112* 112* 99 150* 135* 123*  BUN 67* 56*  57* 50* 45* 40* 32* 30*  CREATININE 2.57* 1.87*  1.87* 1.58* 1.45* 1.41* 1.33* 1.29*  CALCIUM 8.3* 8.6*  8.5* 8.6* 8.5* 8.3* 8.8* 8.8*  MG 2.4 2.3 2.1 2.1 2.0 2.0  --   PHOS 4.8* 4.3 3.9 3.4 2.9  --   --      CBC:  Recent Labs Lab 10/24/14 1607 10/24/14 2200 10/26/14 0449 10/27/14 0407  WBC 6.6 5.8 4.3 5.6  HGB 9.3* 9.6* 9.5* 9.2*  HCT 28.8* 30.4*  29.9* 29.1*  MCV 89.3 89.0 89.1 88.9  PLT 118* 90* 84* 91*      Microbiology:  Recent Results (from the past 720 hour(s))  MRSA PCR Screening     Status: None   Collection Time: 10/12/14  5:25 AM  Result Value Ref Range Status   MRSA by PCR NEGATIVE NEGATIVE Final    Comment:        The GeneXpert MRSA Assay (FDA approved for NASAL specimens only), is one component of a comprehensive MRSA colonization surveillance program. It is not intended to diagnose MRSA infection nor to guide or monitor treatment for MRSA infections.   MRSA PCR Screening     Status: Abnormal   Collection Time: 10/24/14 10:00 PM  Result Value Ref Range Status   MRSA by PCR POSITIVE (A) NEGATIVE Final    Comment: CRITICAL RESULT CALLED TO, READ BACK BY AND VERIFIED WITH: RENEE BABBS ON 10/25/14 AT 0049 BY TB.        The GeneXpert MRSA Assay (FDA approved for NASAL specimens only), is one component of a comprehensive MRSA colonization surveillance program. It is not intended to diagnose MRSA infection nor  to guide or monitor treatment for MRSA infections.     Coagulation Studies: No results for input(s): LABPROT, INR in the last 72 hours.  Urinalysis: No results for input(s): COLORURINE, LABSPEC, PHURINE, GLUCOSEU, HGBUR, BILIRUBINUR, KETONESUR, PROTEINUR, UROBILINOGEN, NITRITE, LEUKOCYTESUR in the last 72 hours.  Invalid input(s): APPERANCEUR    Imaging: No results found.   Medications:   . norepinephrine Stopped (10/26/14 1000)   . antiseptic oral rinse  7 mL Mouth Rinse BID  . aspirin EC  81 mg Oral Daily  . Chlorhexidine Gluconate Cloth  6 each Topical Q0600  . divalproex  500 mg Oral Q12H  . heparin  5,000 Units Subcutaneous 3 times per day  . insulin aspart  0-9 Units Subcutaneous 6 times per day  . mupirocin ointment  1 application Nasal BID  . sertraline  25 mg Oral Daily  . simvastatin  30 mg Oral QHS  . sodium chloride  3 mL Intravenous Q12H     Assessment/ Plan:   81 y.o. male with h/o CKD, CHF, COPD, CAD, DM with CKD, GERD presents with Shortness of brath from [facility]. Worseninig edema, Non rebreather, bradycardic, required cardiac Pacer  1. ARF, unspecified, likely from concurrent illness - No further need for HD is anticipated - UOP > 2 liter - d/c vascath  2. Hyperkalemia - affected cardiac rhythm - d/c Kcl supplements and do not restart outpatient again - K level normal now  3. Anasarca - recent echo with normal EF, diastolic dysfunction - UOP has improved - Use lasix prn only - avoid hypotension  - goal UOP 2-3 L /day     LOS: 3 Warda Mcqueary 10/20/20168:46 AM

## 2014-10-27 NOTE — Progress Notes (Signed)
Spoke with pts wife about contact precautions she states she does not want to wear a gown or gloves.  Educated pts wife about precautions she stated she understands however still does not want to wear a gown

## 2014-10-27 NOTE — Progress Notes (Signed)
Pt lethargic however oriented to self and place; on RA with O2 sats 100%; vss; adequate uop via foley; NSR on cardiac monitor; tolerating diet; pts family updated about plan of care and questions answered will continue to monitor and assess pt

## 2014-10-27 NOTE — Progress Notes (Signed)
Removed trialysis catheter per Dr. Doristine ChurchSingh's verbal orders; dressing dry and intact no s/s of bleeding or hematoma

## 2014-10-27 NOTE — Progress Notes (Signed)
Spoke with Dr Belia HemanKasa pt lethargic he is oriented to self and place he is on RA O2 sats 95% asked MD if he wanted to get an ABG he stated not at this time continue to monitor pt

## 2014-10-27 NOTE — Progress Notes (Signed)
PT Cancellation Note  Patient Details Name: Billy Liu MRN: [id number] DOB: 05/03/1932   Cancelled Treatment:    Reason Eval/Treat Not Completed: Patient at procedure or test/unavailable. Chart reviewed again and attempted to see patient. Pt is out of room for VQ scan currently. Will attempt evaluation at later date/time as pt is available.   Sharalyn InkJason D Huprich PT, DPT   Huprich,Jason 10/27/2014, 2:49 PM

## 2014-10-27 NOTE — Progress Notes (Signed)
PT Hold Note  Patient Details Name: Billy BeamLawrence Puder MRN: [id number] DOB: 10/06/1932   Cancelled Treatment:    Reason Eval/Treat Not Completed: Medical issues which prohibited therapy. Chart reviewed and RN consulted. Pt had temporary fem cath removed at 10:00 and will need at least 2 hours bedrest for hemostasis. Pt awaiting VQ scan to rule out PE. Will perform evaluation when patient is medically cleared.  Sharalyn InkJason D Marysue Fait PT, DPT   Jerrion Tabbert 10/27/2014, 11:17 AM

## 2014-10-27 NOTE — Consult Note (Signed)
Pharmacy Consult for adjust meds for CCRT Indication: CCRT  Allergies  Allergen Reactions  . Penicillins Other (See Comments)    Reaction:  Unknown     Patient Measurements: Height: 6\' 3"  (190.5 cm) Weight: 278 lb 14.1 oz (126.5 kg) IBW/kg (Calculated) : 84.5 Adjusted Body Weight:   Vital Signs: Temp: 98.1 F (36.7 C) (10/20 0700) Temp Source: Core (Comment) (10/20 0400) BP: 160/61 mmHg (10/20 0700) Pulse Rate: 102 (10/20 0630) Intake/Output from previous day: 10/19 0701 - 10/20 0700 In: 607.6 [P.O.:600; I.V.:7.6] Out: 3137 [Urine:2275] Intake/Output from this shift:    Labs:  Recent Labs  10/24/14 2200  10/25/14 0512  10/25/14 2344 10/26/14 0449 10/26/14 0451 10/26/14 0758 10/26/14 1156 10/27/14 0407 10/27/14 0753  WBC 5.8  --   --   --   --  4.3  --   --   --  5.6  --   HGB 9.6*  --   --   --   --  9.5*  --   --   --  9.2*  --   HCT 30.4*  --   --   --   --  29.9*  --   --   --  29.1*  --   PLT 90*  --   --   --   --  84*  --   --   --  91*  --   APTT  --   --  36  --   --  42*  --   --   --   --   --   CREATININE 3.11*  < > 2.91*  < > 1.87*  1.87*  --  1.58* 1.45* 1.41* 1.33* 1.29*  MG 2.7*  < > 2.5*  < > 2.3  --  2.1 2.1 2.0 2.0  --   PHOS  --   < >  --   < > 4.3  --  3.9 3.4 2.9  --   --   ALBUMIN  --   < >  --   < > 3.6  3.5  --  3.6 3.2* 3.0* 3.4*  --   PROT  --   --   --   --  6.4*  --   --   --   --  6.6  --   AST  --   --   --   --  142*  --   --   --   --  72*  --   ALT  --   --   --   --  209*  --   --   --   --  142*  --   ALKPHOS  --   --   --   --  103  --   --   --   --  91  --   BILITOT  --   --   --   --  <0.1*  --   --   --   --  0.4  --   < > = values in this interval not displayed. Estimated Creatinine Clearance: 65.4 mL/min (by C-G formula based on Cr of 1.29).   Microbiology: Recent Results (from the past 720 hour(s))  MRSA PCR Screening     Status: None   Collection Time: 10/12/14  5:25 AM  Result Value Ref Range Status   MRSA  by PCR NEGATIVE NEGATIVE Final    Comment:  The GeneXpert MRSA Assay (FDA approved for NASAL specimens only), is one component of a comprehensive MRSA colonization surveillance program. It is not intended to diagnose MRSA infection nor to guide or monitor treatment for MRSA infections.   MRSA PCR Screening     Status: Abnormal   Collection Time: 10/24/14 10:00 PM  Result Value Ref Range Status   MRSA by PCR POSITIVE (A) NEGATIVE Final    Comment: CRITICAL RESULT CALLED TO, READ BACK BY AND VERIFIED WITH: RENEE BABBS ON 10/25/14 AT 0049 BY TB.        The GeneXpert MRSA Assay (FDA approved for NASAL specimens only), is one component of a comprehensive MRSA colonization surveillance program. It is not intended to diagnose MRSA infection nor to guide or monitor treatment for MRSA infections.     Medical History: Past Medical History  Diagnosis Date  . Chronic diastolic CHF (congestive heart failure) (HCC)   . COPD (chronic obstructive pulmonary disease) (HCC)   . Coronary artery disease   . Diabetes mellitus without complication (HCC)   . Hypertension   . Renal disorder   . GERD (gastroesophageal reflux disease)   . Hyperlipemia   . Anxiety   . Obesity   . CKD (chronic kidney disease), stage III     Medications:  Scheduled:  . antiseptic oral rinse  7 mL Mouth Rinse BID  . aspirin EC  81 mg Oral Daily  . Chlorhexidine Gluconate Cloth  6 each Topical Q0600  . divalproex  500 mg Oral Q12H  . heparin  5,000 Units Subcutaneous 3 times per day  . insulin aspart  0-9 Units Subcutaneous 6 times per day  . mupirocin ointment  1 application Nasal BID  . sertraline  25 mg Oral Daily  . simvastatin  30 mg Oral QHS  . sodium chloride  3 mL Intravenous Q12H    Assessment: Pt is a 81 year old male with acute on chronic renal failure requiring CRRT. Pharmacy consulted to monitor patients medications and adjust as needed for CRRT.   Plan:  Patient is off CRRT  currently with no medication adjustments necessary. Will continue to follow plans for RRT.    Luisa Hart D 10/27/2014,8:37 AM

## 2014-10-27 NOTE — Progress Notes (Signed)
Informed Dr Winona LegatoVaickute of chest xray results acknowledged no further orders pts O2 sats on RA upper 90's; pt has not had a bowel movement since admission per Dr Arlys JohnVaickute's telephone orders may place orders for Colace and Senna

## 2014-10-27 NOTE — Progress Notes (Signed)
Spoke with Dr. Winona LegatoVaickute about pts 4+ edema of the scrotum she is aware md acknowledged no further orders given; per Dr. Winona LegatoVaickute pt may transfer to 2A

## 2014-10-28 LAB — GLUCOSE, CAPILLARY
GLUCOSE-CAPILLARY: 125 mg/dL — AB (ref 65–99)
GLUCOSE-CAPILLARY: 141 mg/dL — AB (ref 65–99)
GLUCOSE-CAPILLARY: 152 mg/dL — AB (ref 65–99)
GLUCOSE-CAPILLARY: 158 mg/dL — AB (ref 65–99)
GLUCOSE-CAPILLARY: 95 mg/dL (ref 65–99)
Glucose-Capillary: 216 mg/dL — ABNORMAL HIGH (ref 65–99)

## 2014-10-28 MED ORDER — FINASTERIDE 5 MG PO TABS
5.0000 mg | ORAL_TABLET | Freq: Every day | ORAL | Status: DC
  Administered 2014-10-28 – 2014-10-31 (×4): 5 mg via ORAL
  Filled 2014-10-28 (×4): qty 1

## 2014-10-28 MED ORDER — TAMSULOSIN HCL 0.4 MG PO CAPS
0.4000 mg | ORAL_CAPSULE | Freq: Every day | ORAL | Status: DC
  Administered 2014-10-28 – 2014-10-31 (×4): 0.4 mg via ORAL
  Filled 2014-10-28 (×4): qty 1

## 2014-10-28 MED ORDER — METOPROLOL SUCCINATE ER 100 MG PO TB24
100.0000 mg | ORAL_TABLET | Freq: Every day | ORAL | Status: DC
  Administered 2014-10-28 – 2014-10-31 (×4): 100 mg via ORAL
  Filled 2014-10-28 (×4): qty 1

## 2014-10-28 MED ORDER — TORSEMIDE 20 MG PO TABS
40.0000 mg | ORAL_TABLET | Freq: Every day | ORAL | Status: DC
  Administered 2014-10-28 – 2014-10-31 (×4): 40 mg via ORAL
  Filled 2014-10-28 (×4): qty 2

## 2014-10-28 NOTE — Evaluation (Signed)
Physical Therapy Evaluation Patient Details Name: Billy Liu MRN: [id number] DOB: 1932-05-26 Today's Date: 10/28/2014   History of Present Illness  Pt is an 81 y.o. male presenting to hospital with general weakness, SOB, and weight gain on 10/24/14.  Pt found to be bradycardic and external cardiac pacemaker placed (turned off pacemaker 11/04/14).  Pt also with elevated potassium initially.  R temporary femoral cath placed 10/24/14 and removed 10/27/14.  VQ scan 10/27/14 not suspicious for PE but did show absent L LL and significant decreased R LL ventilation.  Per verbal discussion with MD Winona Legato 10/28/14, pt is not on bed rest and is cleared to get OOB.  Clinical Impression  Currently pt demonstrates impairments with strength, balance, coordination, cognition, and limitations with functional mobility.  Prior to admission, pt reports he was independent with bed mobility, transfers, and independent with manual w/c mobility (pt reports being nonambulatory baseline).  Pt lives at [facility] in LTC for almost 10 years.  Currently pt is max assist x2 with bed mobility and requires 2-3 assist to scoot to R on edge of bed.  Some cognitive confusion noted during session and pt had difficulty following commands on L side more than R side. Pt would benefit from skilled PT to address above noted impairments and functional limitations.  Recommend pt discharge to STR when medically appropriate.     Follow Up Recommendations SNF    Equipment Recommendations       Recommendations for Other Services       Precautions / Restrictions Precautions Precautions: Fall Precaution Comments: Contact Isolation Restrictions Weight Bearing Restrictions: No      Mobility  Bed Mobility Overal bed mobility: Needs Assistance;+2 for physical assistance Bed Mobility: Supine to Sit;Sit to Supine     Supine to sit: Max assist;+2 for physical assistance;+2 for safety/equipment;HOB elevated (pt able to move  B LE's towards side of bed with vc's; pt used R UE on railing to assist with sitting upright) Sit to supine: Max assist;+2 for physical assistance;HOB elevated   General bed mobility comments: assist for trunk and B LE's; vc's required for technique  Transfers Overall transfer level: Needs assistance Equipment used: None Transfers: Lateral/Scoot Transfers          Lateral/Scoot Transfers: Max assist;+2 physical assistance (2 assist in front of pt blocking B knees to assist and 3rd assist behind pt ) General transfer comment: pt required vc's for hand and feet placement and for technique with scooting to R on edge of bed; deferred transfer to bed d/t significant assist levels required for this  Ambulation/Gait             General Gait Details: pt non-ambulatory baseline  Stairs            Wheelchair Mobility    Modified Rankin (Stroke Patients Only)       Balance Overall balance assessment: Needs assistance Sitting-balance support: Bilateral upper extremity supported;Feet supported Sitting balance-Leahy Scale: Poor Sitting balance - Comments: pt fluctuating between min to max assist d/t posterior lean; pt requiring vc's to lean forward and keep his feet on floor to decrease posterior lean Postural control: Posterior lean                                   Pertinent Vitals/Pain Pain Assessment: No/denies pain  See flowsheet for HR, O2, and BP vitals.    Home Living Family/patient expects to be  discharged to::  ([facility])                 Additional Comments: [facility].    Prior Function Level of Independence: Needs assistance   Gait / Transfers Assistance Needed: Pt was independent with bed mobility and transfers to w/c.  Pt does not walk but reports being independent with propelling manual w/c in facility.  ADL's / Homemaking Assistance Needed: Meals, showers,  cleaning, laundry        Hand Dominance        Extremity/Trunk Assessment   Upper Extremity Assessment: LUE deficits/detail;RUE deficits/detail RUE Deficits / Details: R shoulder flexion 3+/5; R elbow flexion and extension 4-/5; good R hand grip     LUE Deficits / Details: L shoulder flexion to approximately 30 degrees (limited d/t L shoulder pain with movement); L shoulder flexion PROM 90 degrees (limited d/t L shoulder pain); L elbow flexion and extension at least 3+/5; decreased L hand grip compared to R   Lower Extremity Assessment: LLE deficits/detail;RLE deficits/detail RLE Deficits / Details: R DF at least 3+/5; R hip flexion at least 3+/5; R knee flexion/extension at least 3+/5 LLE Deficits / Details: L hip flexion at least 3/5; L knee flexion/extension at least 3/5; L DF/PF pt did not actively move on command (only PROM)     Communication   Communication: No difficulties  Cognition Arousal/Alertness: Awake/alert Behavior During Therapy: WFL for tasks assessed/performed Overall Cognitive Status: No family/caregiver present to determine baseline cognitive functioning (Oriented to person, place, year, and situation.  Pt with difficulty following commands L UE/LE more than R UE/LE.  Some general confusion noted during session.)                      General Comments   Nursing cleared pt for participation in physical therapy.  Pt agreeable to PT session. Scrotal edema noted.    Exercises  Therapeutic activity:  Pt given vc's for technique with bed mobility and scooting to R on edge of bed (see above); pt also given vc's and tactile cues to increase forward lean in sitting to reduce posterior lean.      Assessment/Plan    PT Assessment Patient needs continued PT services  PT Diagnosis Generalized weakness   PT Problem List Decreased strength;Decreased activity tolerance;Decreased balance;Decreased mobility;Decreased coordination  PT Treatment Interventions DME  instruction;Functional mobility training;Therapeutic activities;Therapeutic exercise;Balance training;Patient/family education;Wheelchair mobility training   PT Goals (Current goals can be found in the Care Plan section) Acute Rehab PT Goals Patient Stated Goal: to be able to transfer independently again PT Goal Formulation: With patient Time For Goal Achievement: 11/11/14 Potential to Achieve Goals: Fair    Frequency Min 2X/week   Barriers to discharge        Co-evaluation               End of Session Equipment Utilized During Treatment: Gait belt Activity Tolerance: Patient limited by fatigue Patient left: in bed;with call bell/phone within reach;with bed alarm set;with SCD's reapplied (B heels elevated via pillow) Nurse Communication: Mobility status         Time: [id number] PT Time Calculation (min) (ACUTE ONLY): 40 min   Charges:   PT Evaluation $Initial PT Evaluation Tier I: 1 Procedure PT Treatments $Therapeutic Activity: 8-22 mins   PT G CodesIrving Burton:        Tariana Moldovan 10/28/2014, 12:42 PM  Leitha Bleak, [place]

## 2014-10-28 NOTE — Progress Notes (Signed)
Nutrition Follow-up     INTERVENTION:   Meals and Snacks: Cater to patient preferences Medical Food Supplement Therapy: recommend addition of Mighty Shakes BID  NUTRITION DIAGNOSIS:   Altered nutrition lab value related to acute illness as evidenced by  (elevated K and renal labs). Improved   GOAL:   Patient will meet greater than or equal to 90% of their needs   MONITOR:    (Energy intake, Electrolyte and renal profile, Anthropometric)  REASON FOR ASSESSMENT:    (renal diet)    ASSESSMENT:    Pt off CRRT, temp HD cath removed yesterday  Diet Order:  Diet renal with fluid restriction Fluid restriction:: 1200 mL Fluid; Room service appropriate?: Yes; Fluid consistency:: Thin  Energy Intake: pt reports good appetite; recorded po intake 54% on average. Pt reports he eats at last 50% of his meals. Pt requesting orange sherbet on visit today.   Electrolyte and Renal Profile:  Recent Labs Lab 10/26/14 0451 10/26/14 0758 10/26/14 1156 10/27/14 0407 10/27/14 0753  BUN 50* 45* 40* 32* 30*  CREATININE 1.58* 1.45* 1.41* 1.33* 1.29*  NA 137 137 135 136 137  K 4.8 4.6 4.8 5.0 5.0  MG 2.1 2.1 2.0 2.0  --   PHOS 3.9 3.4 2.9  --   --    Glucose Profile:  Recent Labs  10/28/14 0011 10/28/14 0800 10/28/14 1203  GLUCAP 141* 95 152*   Meds: ss novolog  Height:   Ht Readings from Last 1 Encounters:  10/24/14 6\' 3"  (1.905 m)    Weight:   Wt Readings from Last 1 Encounters:  10/28/14 276 lb 3.8 oz (125.3 kg)   Filed Weights   10/26/14 0500 10/27/14 0435 10/28/14 0450  Weight: 284 lb 6.3 oz (129 kg) 278 lb 14.1 oz (126.5 kg) 276 lb 3.8 oz (125.3 kg)     BMI:  Body mass index is 34.53 kg/(m^2).  Estimated Nutritional Needs:   Kcal:  [id number] (IF 1.0-1.2, AF 1.3) 2190-2628 kcals/d  Protein:  (1.2-1.5 g/kg) 107-134 gm/d  Fluid:  (25-1330ml/kg) 2225-266570ml/d  EDUCATION NEEDS:   Education needs no appropriate at this time  LOW Care Level  Romelle Starcherate Juan Olthoff MS,  RD, LDN [phone] Pager

## 2014-10-28 NOTE — Progress Notes (Signed)
Per MD patient may D/C over the weekend. Clinical Child psychotherapistocial Worker (CSW) contacted [employer] [facility] and made her aware of above. Per Gavin Poundeborah patient can return over the weekend to room [address]. CSW will continue to follow and assist as needed.   Jetta LoutBailey Morgan, LCSWA [phone]

## 2014-10-28 NOTE — Consult Note (Signed)
Pharmacy Consult for adjust meds for CCRT Indication: CCRT  Allergies  Allergen Reactions  . Penicillins Other (See Comments)    Reaction:  Unknown     Patient Measurements: Height:  (190.5 cm) Weight: 276 lb 3.8 oz (125.3 kg) IBW/kg (Calculated) : 84.5 Adjusted Body Weight:   Vital Signs: Temp: 98 F (36.7 C) (10/20 2000) Temp Source: Oral (10/20 2000) BP: 158/53 mmHg (10/21 0600) Pulse Rate: 103 (10/21 0600) Intake/Output from previous day: 10/20 0701 - 10/21 0700 In: -  Out: 2375 [Urine:2375] Intake/Output from this shift:    Labs:  Recent Labs  10/25/14 2344 10/26/14 0449 10/26/14 0451 10/26/14 0758 10/26/14 1156 10/27/14 0407 10/27/14 0753  WBC  --  4.3  --   --   --  5.6  --   HGB  --  9.5*  --   --   --  9.2*  --   HCT  --  29.9*  --   --   --  29.1*  --   PLT  --  84*  --   --   --  91*  --   APTT  --  42*  --   --   --   --   --   CREATININE 1.87*  1.87*  --  1.58* 1.45* 1.41* 1.33* 1.29*  MG 2.3  --  2.1 2.1 2.0 2.0  --   PHOS 4.3  --  3.9 3.4 2.9  --   --   ALBUMIN 3.6  3.5  --  3.6 3.2* 3.0* 3.4*  --   PROT 6.4*  --   --   --   --  6.6  --   AST 142*  --   --   --   --  72*  --   ALT 209*  --   --   --   --  142*  --   ALKPHOS 103  --   --   --   --  91  --   BILITOT <0.1*  --   --   --   --  0.4  --    Estimated Creatinine Clearance: 65.1 mL/min (by C-G formula based on Cr of 1.29).   Microbiology: Recent Results (from the past 720 hour(s))  MRSA PCR Screening     Status: None   Collection Time: 10/12/14  5:25 AM  Result Value Ref Range Status   MRSA by PCR NEGATIVE NEGATIVE Final    Comment:        The GeneXpert MRSA Assay (FDA approved for NASAL specimens only), is one component of a comprehensive MRSA colonization surveillance program. It is not intended to diagnose MRSA infection nor to guide or monitor treatment for MRSA infections.   MRSA PCR Screening     Status: Abnormal   Collection Time: 10/24/14 10:00 PM   Result Value Ref Range Status   MRSA by PCR POSITIVE (A) NEGATIVE Final    Comment: CRITICAL RESULT CALLED TO, READ BACK BY AND VERIFIED WITH: RENEE BABBS ON 10/25/14 AT 0049 BY TB.        The GeneXpert MRSA Assay (FDA approved for NASAL specimens only), is one component of a comprehensive MRSA colonization surveillance program. It is not intended to diagnose MRSA infection nor to guide or monitor treatment for MRSA infections.     Medical History: Past Medical History  Diagnosis Date  . Chronic diastolic CHF (congestive heart failure) (HCC)   . COPD (chronic obstructive pulmonary disease) (  HCC)   . Coronary artery disease   . Diabetes mellitus without complication (HCC)   . Hypertension   . Renal disorder   . GERD (gastroesophageal reflux disease)   . Hyperlipemia   . Anxiety   . Obesity   . CKD (chronic kidney disease), stage III     Medications:  Scheduled:  . amLODipine  10 mg Oral Daily  . antiseptic oral rinse  7 mL Mouth Rinse BID  . aspirin EC  81 mg Oral Daily  . Chlorhexidine Gluconate Cloth  6 each Topical Q0600  . cloNIDine  0.1 mg Oral BID  . divalproex  500 mg Oral Q12H  . docusate sodium  100 mg Oral BID  . heparin  5,000 Units Subcutaneous 3 times per day  . insulin aspart  0-9 Units Subcutaneous 6 times per day  . mupirocin ointment  1 application Nasal BID  . senna-docusate  1 tablet Oral Daily  . sertraline  25 mg Oral Daily  . simvastatin  30 mg Oral QHS  . sodium chloride  3 mL Intravenous Q12H    Assessment: Pt is a [age] year old male with acute on chronic renal failure requiring CRRT. Pharmacy consulted to monitor patients medications and adjust as needed for CRRT.   Plan:  Patient is off CRRT currently with no medication adjustments necessary. Will continue to follow plans for RRT.    Billy Liu, Billy Liu 10/28/2014,7:50 AM

## 2014-10-28 NOTE — Progress Notes (Signed)
Subjective:   States he is doing well No specific c/o today Serum creatinine improved to 1.29 down from 1.33. Urine output 2375 cc  Objective:  Vital signs in last 24 hours:  Temp:  [97.9 F (36.6 C)-98.4 F (36.9 C)] 97.9 F (36.6 C) (10/21 0806) Pulse Rate:  [90-108] 106 (10/21 0800) Resp:  [14-27] 17 (10/21 0800) BP: (141-183)/(49-75) 171/69 mmHg (10/21 0800) SpO2:  [91 %-100 %] 98 % (10/21 0806) Weight:  [125.3 kg (276 lb 3.8 oz)] 125.3 kg (276 lb 3.8 oz) (10/21 0450)  Weight change: -1.2 kg (-2 lb 10.3 oz) Filed Weights   10/26/14 0500 10/27/14 0435 10/28/14 0450  Weight: 129 kg (284 lb 6.3 oz) 126.5 kg (278 lb 14.1 oz) 125.3 kg (276 lb 3.8 oz)    Intake/Output:    Intake/Output Summary (Last 24 hours) at 10/28/14 0859 Last data filed at 10/28/14 0549  Gross per 24 hour  Intake      0 ml  Output   2375 ml  Net  -2375 ml     Physical Exam: General: NAD, laying in bed  HEENT anicteric  Neck supple  Pulm/lungs Normal effort  CVS/Heart paced rhythm  Abdomen:  Soft, distended, [place] edema  Extremities: ++ dependent edema  Neurologic: alert  Skin: No rashes  Access:    Foley    Basic Metabolic Panel:   Recent Labs Lab 10/25/14 1534 10/25/14 2344 10/26/14 0451 10/26/14 0758 10/26/14 1156 10/27/14 0407 10/27/14 0753  NA 135 135  134* 137 137 135 136 137  K 5.5* 5.3*  5.2* 4.8 4.6 4.8 5.0 5.0  CL 101 101  101 103 101 101 102 102  CO2 28 30  29 30 31 30 29 29   GLUCOSE 92 112*  112* 112* 99 150* 135* 123*  BUN 67* 56*  57* 50* 45* 40* 32* 30*  CREATININE 2.57* 1.87*  1.87* 1.58* 1.45* 1.41* 1.33* 1.29*  CALCIUM 8.3* 8.6*  8.5* 8.6* 8.5* 8.3* 8.8* 8.8*  MG 2.4 2.3 2.1 2.1 2.0 2.0  --   PHOS 4.8* 4.3 3.9 3.4 2.9  --   --      CBC:  Recent Labs Lab 10/24/14 1607 10/24/14 2200 10/26/14 0449 10/27/14 0407  WBC 6.6 5.8 4.3 5.6  HGB 9.3* 9.6* 9.5* 9.2*  HCT 28.8* 30.4* 29.9* 29.1*  MCV 89.3 89.0 89.1 88.9  PLT 118* 90* 84* 91*       Microbiology:  Recent Results (from the past 720 hour(s))  MRSA PCR Screening     Status: None   Collection Time: 10/12/14  5:25 AM  Result Value Ref Range Status   MRSA by PCR NEGATIVE NEGATIVE Final    Comment:        The GeneXpert MRSA Assay (FDA approved for NASAL specimens only), is one component of a comprehensive MRSA colonization surveillance program. It is not intended to diagnose MRSA infection nor to guide or monitor treatment for MRSA infections.   MRSA PCR Screening     Status: Abnormal   Collection Time: 10/24/14 10:00 PM  Result Value Ref Range Status   MRSA by PCR POSITIVE (A) NEGATIVE Final    Comment: CRITICAL RESULT CALLED TO, READ BACK BY AND VERIFIED WITH: RENEE BABBS ON 10/25/14 AT 0049 BY TB.        The GeneXpert MRSA Assay (FDA approved for NASAL specimens only), is one component of a comprehensive MRSA colonization surveillance program. It is not intended to diagnose MRSA infection nor to guide  or monitor treatment for MRSA infections.     Coagulation Studies: No results for input(s): LABPROT, INR in the last 72 hours.  Urinalysis: No results for input(s): COLORURINE, LABSPEC, PHURINE, GLUCOSEU, HGBUR, BILIRUBINUR, KETONESUR, PROTEINUR, UROBILINOGEN, NITRITE, LEUKOCYTESUR in the last 72 hours.  Invalid input(s): APPERANCEUR    Imaging: Nm Pulmonary Perf And Vent  10/27/2014  CLINICAL DATA:  Acute respiratory failure with hypoxemia, COPD, chronic diastolic CHF, coronary artery disease, diabetes mellitus, hypertension, stage III chronic kidney disease EXAM: NUCLEAR MEDICINE VENTILATION - PERFUSION LUNG SCAN TECHNIQUE: Ventilation images were obtained in anterior and posterior projections using inhaled aerosol Tc-39m DTPA. Perfusion images were obtained in anterior and posterior projections after intravenous injection of Tc-23m MAA. Patient was unable to tolerate additional imaging/remaining projections on both the ventilatory and  perfusion exams. RADIOPHARMACEUTICALS:  42.95 mCi Technetium-54m DTPA aerosol inhalation and 4.65 mCi Technetium-9m MAA IV COMPARISON:  None; correlation chest radiograph 10/27/2014 FINDINGS: Ventilation: Significant central airway deposition of tracer. Absent ventilation LEFT lower lobe. Significantly diminished ventilation RIGHT lower lobe. Perfusion: Mild diminished perfusion at lateral aspect of RIGHT lower lobe on the posterior view. Significantly better RIGHT lower lobe perfusion than ventilation. Remaining perfusion in both lungs is normal on both the anterior and posterior views. Chest radiograph: Enlargement of cardiac silhouette with pulmonary vascular congestion, perihilar edema, bibasilar effusions and bibasilar atelectasis. IMPRESSION: Absent LEFT lower lobe and significantly decreased RIGHT lower lobe ventilation. Mild diminished perfusion at lateral aspect of RIGHT lower lobe, much less severe than accompanying ventilatory findings and much better than anticipated from accompanying chest radiograph. No other perfusion abnormalities are identified. Although the study is limited, findings are not suspicious for pulmonary embolism. Electronically Signed   By: Ulyses Southward M.D.   On: 10/27/2014 15:27   Dg Chest Port 1 View  10/27/2014  CLINICAL DATA:  Increase shortness of breath EXAM: PORTABLE CHEST 1 VIEW COMPARISON:  10/24/2014 FINDINGS: Stable enlargement of cardiac silhouette. Significant pulmonary venous congestion. Significant diffuse hazy attenuation over both lungs. IMPRESSION: Cardiac enlargement, vascular congestion, and moderate bilateral pleural effusions. Hazy attenuation mid to lower lung zones likely reflects a combination of atelectasis and pulmonary edema. Overall appearance is slightly worse when compared to prior study. Electronically Signed   By: Esperanza Heir M.D.   On: 10/27/2014 10:50     Medications:     . amLODipine  10 mg Oral Daily  . antiseptic oral rinse  7 mL  Mouth Rinse BID  . aspirin EC  81 mg Oral Daily  . Chlorhexidine Gluconate Cloth  6 each Topical Q0600  . cloNIDine  0.1 mg Oral BID  . divalproex  500 mg Oral Q12H  . docusate sodium  100 mg Oral BID  . heparin  5,000 Units Subcutaneous 3 times per day  . insulin aspart  0-9 Units Subcutaneous 6 times per day  . mupirocin ointment  1 application Nasal BID  . senna-docusate  1 tablet Oral Daily  . sertraline  25 mg Oral Daily  . simvastatin  30 mg Oral QHS  . sodium chloride  3 mL Intravenous Q12H     Assessment/ Plan:  81 y.o. male with h/o CKD, CHF, COPD, CAD, DM with CKD, GERD presents with Shortness of brath from [facility] [place] edema, Non rebreather, bradycardic, required cardiac Pacer  1. ARF, unspecified, likely from concurrent illness - No further need for HD is anticipated - UOP > 2 liter   2. Hyperkalemia - affected cardiac rhythm - d/c Kcl  supplements and do not restart outpatient again - K level normal now  3. Anasarca - recent echo with normal EF, diastolic dysfunction - UOP has improved  4. Hypertension Will restart metoprolol May also add lasix and eventually Lisinopril     LOS: 4 Billy Liu 10/21/20168:59 AM

## 2014-10-28 NOTE — Progress Notes (Signed)
[facility] [place] at [facility]   PATIENT NAME: Billy BeamLawrence Liu    MR#:  [id number]  DATE OF BIRTH:  10/05/1932  SUBJECTIVE:  CHIEF COMPLAINT:   Chief Complaint  Patient presents with  . Bradycardia   Patient is 81 year old African-American male with history of runny diastolic CHF. CK D stage III, COPD, hyperlipidemia, obesity who presents to the hospital with complaints of generalized weakness as well as shortness of breath and weight gain. On arrival to emergency room, he was noted to be hypoxic with O2 sats in 80s and hypotensive with systolic blood pressure in 60s. He was noted to be bradycardic with heart rate in 30s to 40s. He was started on external pacemaker and Levophed. His labs revealed elevated potassium level of 7.1, and creatinine level of 3.4. Because of severe bradycardia and the critically high potassium level and acute renal failure. Patient had hemodialysis catheter placed and initiated on CRRT. Today, feels good. Denies any significant shortness of breath. Patient's potassium level still remains high at 6.6. However, he is off external pacemaker. Bladder scan revealed approximately 700-800 cc of fluid. Foley catheter was attempted to be placed, however, unsuccessful. Urologist was consulted. Patient remains on 5 mics of Levophed. [facility] revealed ejection fraction of 55%. Right ventricle systolic pressure of 71.2 mmHg, moderate tricuspid regurgitation Good urine output over the past 24 hours of approximately 2400 cc. Denies any pain or shortness of breath. Good oral intake. Swelling remains significant in periphery, oxygenation is good. O2 sats 92-97% on room air at rest.     Review of Systems  Constitutional: Negative for fever, chills and weight loss.  HENT: Negative for congestion.   Eyes: Negative for blurred vision and double vision.  Respiratory: Positive for shortness of breath. Negative for cough, sputum  production and wheezing.   Cardiovascular: Negative for chest pain, palpitations, orthopnea, leg swelling and PND.  Gastrointestinal: Negative for nausea, vomiting, abdominal pain, diarrhea, constipation and blood in stool.  Genitourinary: Negative for dysuria, urgency, frequency and hematuria.  Musculoskeletal: Negative for falls.  Neurological: Negative for dizziness, tremors, focal weakness and headaches.  Endo/Heme/Allergies: Does not bruise/bleed easily.  Psychiatric/Behavioral: Negative for depression. The patient does not have insomnia.     VITAL SIGNS: Blood pressure 132/60, pulse 97, temperature 97.9 F (36.6 C), temperature source Oral, resp. rate 11, height 6\' 3"  (1.905 m), weight 125.3 kg (276 lb 3.8 oz), SpO2 92 %.  PHYSICAL EXAMINATION:   GENERAL:  81 y.o.-year-old patient sitting in the bed with no acute distress. Comfortable. Somnolent and slurring speech EYES: Pupils equal, round, reactive to light and accommodation. No scleral icterus. Extraocular muscles intact.  HEENT: Head atraumatic, normocephalic. Oropharynx and nasopharynx clear.  NECK:  Supple, no jugular venous distention. No thyroid enlargement, no tenderness.  LUNGS: Air entrance at bases bilaterally, no wheezing, but has intermittent rales,rhonchi and crepitations anteriorly . No use of accessory muscles of respiration.  CARDIOVASCULAR: S1, S2 normal. No murmurs, rubs, or gallops. Rhythm was regular ABDOMEN: Soft, nontender, nondistended. Bowel sounds present. No organomegaly or mass.  EXTREMITIES: Severe anasarca with lower extremity abdominal as well as upper extremity edema , also scrotal and pedal edema, no cyanosis, or clubbing. Foley catheter is placed, light-colored urine, large amount NEUROLOGIC: Cranial nerves II through XII are intact. Muscle strength 5/5 in all extremities. Sensation intact. Gait not checked.  PSYCHIATRIC: The patient is somnolent , awakens easily, converses although difficult to  understand due to slurry  speech , oriented x 3.  SKIN: No obvious rash, lesion, or ulcer. Severe perineal,.  abdominal , lower and upper extremity edema ,   large amount of light colored urine in Foley bag  ORDERS/RESULTS REVIEWED:   CBC  Recent Labs Lab 10/24/14 1607 10/24/14 2200 10/26/14 0449 10/27/14 0407  WBC 6.6 5.8 4.3 5.6  HGB 9.3* 9.6* 9.5* 9.2*  HCT 28.8* 30.4* 29.9* 29.1*  PLT 118* 90* 84* 91*  MCV 89.3 89.0 89.1 88.9  MCH 28.7 28.1 28.4 28.1  MCHC 32.2 31.5* 31.9* 31.6*  RDW 17.4* 17.1* 17.0* 16.4*   ------------------------------------------------------------------------------------------------------------------  Chemistries   Recent Labs Lab 10/25/14 2344 10/26/14 0451 10/26/14 0758 10/26/14 1156 10/27/14 0407 10/27/14 0753  NA 135  134* 137 137 135 136 137  K 5.3*  5.2* 4.8 4.6 4.8 5.0 5.0  CL 101  101 103 101 101 102 102  CO2 GLUCOSE 112*  112* 112* 99 150* 135* 123*  BUN 56*  57* 50* 45* 40* 32* 30*  CREATININE 1.87*  1.87* 1.58* 1.45* 1.41* 1.33* 1.29*  CALCIUM 8.6*  8.5* 8.6* 8.5* 8.3* 8.8* 8.8*  MG 2.3 2.1 2.1 2.0 2.0  --   AST 142*  --   --   --  72*  --   ALT 209*  --   --   --  142*  --   ALKPHOS 103  --   --   --  91  --   BILITOT <0.1*  --   --   --  0.4  --    ------------------------------------------------------------------------------------------------------------------ estimated creatinine clearance is 65.1 mL/min (by C-G formula based on Cr of 1.29). ------------------------------------------------------------------------------------------------------------------  Recent Labs  10/25/14 1534  TSH 3.488    Cardiac Enzymes  Recent Labs Lab 10/24/14 1607 10/25/14 0200 10/25/14 0512  TROPONINI <0.03 0.11* 0.42*   ------------------------------------------------------------------------------------------------------------------ Invalid input(s):  POCBNP ---------------------------------------------------------------------------------------------------------------  RADIOLOGY: Nm Pulmonary Perf And Vent  10/27/2014  CLINICAL DATA:  Acute respiratory failure with hypoxemia, COPD, chronic diastolic CHF, coronary artery disease, diabetes mellitus, hypertension, stage III chronic kidney disease EXAM: NUCLEAR MEDICINE VENTILATION - PERFUSION LUNG SCAN TECHNIQUE: Ventilation images were obtained in anterior and posterior projections using inhaled aerosol Tc-38m DTPA. Perfusion images were obtained in anterior and posterior projections after intravenous injection of Tc-26m MAA. Patient was unable to tolerate additional imaging/remaining projections on both the ventilatory and perfusion exams. RADIOPHARMACEUTICALS:  42.95 mCi Technetium-48m DTPA aerosol inhalation and 4.65 mCi Technetium-78m MAA IV COMPARISON:  None; correlation chest radiograph 10/27/2014 FINDINGS: Ventilation: Significant central airway deposition of tracer. Absent ventilation LEFT lower lobe. Significantly diminished ventilation RIGHT lower lobe. Perfusion: Mild diminished perfusion at lateral aspect of RIGHT lower lobe on the posterior view. Significantly better RIGHT lower lobe perfusion than ventilation. Remaining perfusion in both lungs is normal on both the anterior and posterior views. Chest radiograph: Enlargement of cardiac silhouette with pulmonary vascular congestion, perihilar edema, bibasilar effusions and bibasilar atelectasis. IMPRESSION: Absent LEFT lower lobe and significantly decreased RIGHT lower lobe ventilation. Mild diminished perfusion at lateral aspect of RIGHT lower lobe, much less severe than accompanying ventilatory findings and much better than anticipated from accompanying chest radiograph. No other perfusion abnormalities are identified. Although the study is limited, findings are not suspicious for pulmonary embolism. Electronically Signed   By: Ulyses Southward M.D.    On: 10/27/2014 15:27   Dg Chest Port 1 View  10/27/2014  CLINICAL DATA:  Increase shortness of breath  EXAM: PORTABLE CHEST 1 VIEW COMPARISON:  10/24/2014 FINDINGS: Stable enlargement of cardiac silhouette. Significant pulmonary venous congestion. Significant diffuse hazy attenuation over both lungs. IMPRESSION: Cardiac enlargement, vascular congestion, and moderate bilateral pleural effusions. Hazy attenuation mid to lower lung zones likely reflects a combination of atelectasis and pulmonary edema. Overall appearance is slightly worse when compared to prior study. Electronically Signed   By: Esperanza Heir M.D.   On: 10/27/2014 10:50    EKG:  Orders placed or performed during the hospital encounter of 10/24/14  . ED EKG within 10 minutes  . ED EKG within 10 minutes    ASSESSMENT AND PLAN:  Active Problems:   Acute hyperkalemia 1. Acute on chronic renal failure, now of CRRT , temporary hemodialysis catheter was removed yesterday, good urinary output , following kidney function, Foley catheter is to be removed prior to discharge as patient is initiated on Flomax and finasteride for suspected BPH and urinary retention on admission. Following patient's in's and outs as well as his weight , the patient had about 9 pound weight loss since his admission to the hospital 2. Urinary retention, status post Foley catheter placement by urology, now initiating on finasteride and Flomax and withdrawal, Foley catheter prior to discharge,  appreciate urology consultation 3. Hyperkalemia, resolved since patient was started on CRRT  4. Severe symptomatic bradycardia, status post external pacemaker placement and usage, now discontinued, likely due to hyperkalemia,  Stable patient's heart rate , now back on metoprolol 5. Shock , cardiogenic , less likely distributive, VQ scan and use of October 2016 revealed absent left lower lobe and right lower lobe ventilation,  although oxygenation is satisfactory, discussed  with Dr. Dema Severin who recommended emergent CT scan of the chest with no contrast if clinical deterioration or CT scan with IV contrast if kidney function stabilizes as outpatient.  6. Elevated troponin, likely demand ischemia, cardiologist is following patient, echo at the beginning of October 2016 showed normal ejection fraction and severe pulmonary hypertension 7. Diabetes mellitus with severe hypoglycemia, resolved now. Continue patient on sliding scale insulin, holding insulin Lantus, oral intake is satisfactory, blood glucose is fluctuating between 110-200, may initiate insulin Lantus if higher 8. Generalized weakness. Initiate physical therapy and transfer patient to  telemetry   Management plans discussed with the patient, family and they are in agreement.   DRUG ALLERGIES:  Allergies  Allergen Reactions  . Penicillins Other (See Comments)    Reaction:  Unknown     CODE STATUS:     Code Status Orders        Start     Ordered   10/24/14 1935  Full code   Continuous     10/24/14 1934    Advance Directive Documentation        Most Recent Value   Type of Advance Directive  Living will   Pre-existing out of facility DNR order (yellow form or pink MOST form)     "MOST" Form in Place?        TOTAL TIME TAKING CARE OF THIS PATIENT: 50 minutes.   Coordination of care time 10 minutes Konstantine Gervasi M.D on 10/28/2014 at 1:23 PM  Between 7am to 6pm - Pager - [phone]  After 6pm go to [email or web address] - password [email or web address] [facility]  [place] [facility]  Office  [phone]  CC: Primary care physician; No primary care provider on file.

## 2014-10-29 LAB — BASIC METABOLIC PANEL
Anion gap: 5 (ref 5–15)
BUN: 23 mg/dL — ABNORMAL HIGH (ref 6–20)
CO2: 28 mmol/L (ref 22–32)
Calcium: 9.4 mg/dL (ref 8.9–10.3)
Chloride: 104 mmol/L (ref 101–111)
Creatinine, Ser: 1.18 mg/dL (ref 0.61–1.24)
GFR calc Af Amer: >60 mL/min (ref >60)
GFR calc non Af Amer: 56 mL/min — ABNORMAL LOW (ref >60)
Glucose, Bld: 102 mg/dL — ABNORMAL HIGH (ref 65–99)
Potassium: 5.9 mmol/L — ABNORMAL HIGH (ref 3.5–5.1)
Sodium: 137 mmol/L (ref 135–145)

## 2014-10-29 LAB — GLUCOSE, CAPILLARY
Glucose-Capillary: 111 mg/dL — ABNORMAL HIGH (ref 65–99)
Glucose-Capillary: 128 mg/dL — ABNORMAL HIGH (ref 65–99)
Glucose-Capillary: 155 mg/dL — ABNORMAL HIGH (ref 65–99)
Glucose-Capillary: 334 mg/dL — ABNORMAL HIGH (ref 65–99)
Glucose-Capillary: 95 mg/dL (ref 65–99)

## 2014-10-29 LAB — POTASSIUM: Potassium: 5.5 mmol/L — ABNORMAL HIGH (ref 3.5–5.1)

## 2014-10-29 MED ORDER — BISACODYL 10 MG RE SUPP
10.0000 mg | Freq: Once | RECTAL | Status: AC
  Administered 2014-10-29: 10 mg via RECTAL
  Filled 2014-10-29: qty 1

## 2014-10-29 NOTE — Progress Notes (Signed)
[facility] - [place] at [facility]   PATIENT NAME: Billy Liu    MR#:  [id number]  DATE OF BIRTH:  [birth year]  SUBJECTIVE:  CHIEF COMPLAINT:   Chief Complaint  Patient presents with  . Bradycardia   Patient presented to the hospital due to acute on chronic renal failure and noted to be severely hypokalemic and also noted to be bradycardic. Renal function is much improved now. Still has some mild hyperkalemia but asymptomatic. No other complaints presently.  REVIEW OF SYSTEMS:    Review of Systems  Constitutional: Negative for fever and chills.  HENT: Negative for congestion and tinnitus.   Eyes: Negative for blurred vision and double vision.  Respiratory: Negative for cough, shortness of breath and wheezing.   Cardiovascular: Negative for chest pain, orthopnea and PND.  Gastrointestinal: Negative for nausea, vomiting, abdominal pain and diarrhea.  Genitourinary: Negative for dysuria and hematuria.  Neurological: Positive for weakness (generalized). Negative for dizziness, sensory change and focal weakness.  All other systems reviewed and are negative.   Nutrition: Renal Diet Tolerating Diet: Yes Tolerating PT: Wheelchair bound at baseline.    DRUG ALLERGIES:   Allergies  Allergen Reactions  . Penicillins Other (See Comments)    Reaction:  Unknown     VITALS:  Blood pressure 175/72, pulse 105, temperature 98.4 F (36.9 C), temperature source Oral, resp. rate 19, height  (1.905 m), weight 123.56 kg (272 lb 6.4 oz), SpO2 98 %.  PHYSICAL EXAMINATION:   Physical Exam  GENERAL:  81 y.o.-year-old obese patient lying in the bed in no acute distress.  EYES: Pupils equal, round, reactive to light and accommodation. No scleral icterus. Extraocular muscles intact.  HEENT: Head atraumatic, normocephalic. Oropharynx and nasopharynx clear.  NECK:  Supple, no jugular venous distention. No thyroid enlargement, no tenderness.  LUNGS: Normal  breath sounds bilaterally, no wheezing, rales, rhonchi. No use of accessory muscles of respiration.  CARDIOVASCULAR: S1, S2 normal. No murmurs, rubs, or gallops.  ABDOMEN: Soft, nontender, nondistended. Bowel sounds present. No organomegaly or mass.  EXTREMITIES: No cyanosis, clubbing or edema b/l.  Anasarca, significant scrotal enlargement.  NEUROLOGIC: Cranial nerves II through XII are intact. No focal Motor or sensory deficits b/l. Globally weak and bed bound   PSYCHIATRIC: The patient is alert and oriented x 2.  SKIN: No obvious rash, lesion, or ulcer.    LABORATORY PANEL:   CBC  Recent Labs Lab 10/27/14 0407  WBC 5.6  HGB 9.2*  HCT 29.1*  PLT 91*   ------------------------------------------------------------------------------------------------------------------  Chemistries   Recent Labs Lab 10/27/14 0407  10/29/14 0454 10/29/14 1126  NA 136  < > 137  --   K 5.0  < > 5.9* 5.5*  CL 102  < > 104  --   CO2 29  < > 28  --   GLUCOSE 135*  < > 102*  --   BUN 32*  < > 23*  --   CREATININE 1.33*  < > 1.18  --   CALCIUM 8.8*  < > 9.4  --   MG 2.0  --   --   --   AST 72*  --   --   --   ALT 142*  --   --   --   ALKPHOS 91  --   --   --   BILITOT 0.4  --   --   --   < > = values in this interval not displayed. ------------------------------------------------------------------------------------------------------------------  Cardiac  Enzymes  Recent Labs Lab 10/25/14 0512  TROPONINI 0.42*   ------------------------------------------------------------------------------------------------------------------  RADIOLOGY:  Nm Pulmonary Perf And Vent  10/27/2014  CLINICAL DATA:  Acute respiratory failure with hypoxemia, COPD, chronic diastolic CHF, coronary artery disease, diabetes mellitus, hypertension, stage III chronic kidney disease EXAM: NUCLEAR MEDICINE VENTILATION - PERFUSION LUNG SCAN TECHNIQUE: Ventilation images were obtained in anterior and posterior projections  using inhaled aerosol Tc-529m DTPA. Perfusion images were obtained in anterior and posterior projections after intravenous injection of Tc-909m MAA. Patient was unable to tolerate additional imaging/remaining projections on both the ventilatory and perfusion exams. RADIOPHARMACEUTICALS:  42.95 mCi Technetium-189m DTPA aerosol inhalation and 4.65 mCi Technetium-189m MAA IV COMPARISON:  None; correlation chest radiograph 10/27/2014 FINDINGS: Ventilation: Significant central airway deposition of tracer. Absent ventilation LEFT lower lobe. Significantly diminished ventilation RIGHT lower lobe. Perfusion: Mild diminished perfusion at lateral aspect of RIGHT lower lobe on the posterior view. Significantly better RIGHT lower lobe perfusion than ventilation. Remaining perfusion in both lungs is normal on both the anterior and posterior views. Chest radiograph: Enlargement of cardiac silhouette with pulmonary vascular congestion, perihilar edema, bibasilar effusions and bibasilar atelectasis. IMPRESSION: Absent LEFT lower lobe and significantly decreased RIGHT lower lobe ventilation. Mild diminished perfusion at lateral aspect of RIGHT lower lobe, much less severe than accompanying ventilatory findings and much better than anticipated from accompanying chest radiograph. No other perfusion abnormalities are identified. Although the study is limited, findings are not suspicious for pulmonary embolism. Electronically Signed   By: Ulyses SouthwardMark  Boles M.D.   On: 10/27/2014 15:27     ASSESSMENT AND PLAN:   81 year old male with past medical history of chronic diastolic CHF, COPD, coronary disease, type 2 diabetes, hypertension, chronic kidney disease, GERD, anxiety, hyperlipidemia who presented to the hospital due to bradycardia and noted to be in acute on chronic renal failure with severe hyperkalemia.  #1 acute on chronic renal failure-this was likely secondary to mild dehydration and also underlying CHF.  -Given patient's  hemodynamic instability, hyperkalemia initially he was on continuous dialysis. This has not been weaned off. Creatinine has improved. In patient's urine output has also improved. -Appreciate nephrology input and continue supportive care for now. Avoid nephrotoxins.  #2 hyperkalemia-improved and resolved his renal function has improved. -Potassium today is 5.5 and follow level in the morning.  #3 bradycardia-this was likely secondary to hyperkalemia but has not resolved his potassium is been corrected. -Patient is hemodynamically stable.  #4 hypertension-continue Norvasc, Toprol, as needed hydralazine.  #5 BPH-continue finasteride, Flomax. Status post Foley with significant scrotal enlargement due to anasarca.    #6 type 2 diabetes without complication-continue sliding scale insulin.  #7 depression-continue Zoloft.  #8 CHF-acute on chronic diastolic dysfunction. -Continue torsemide, metoprolol.    All the records are reviewed and case discussed with Care Management/Social Workerr. Management plans discussed with the patient, family and they are in agreement.  CODE STATUS: Full  DVT Prophylaxis: Heparin subcutaneous  TOTAL TIME TAKING CARE OF THIS PATIENT: 30 minutes.   POSSIBLE D/C IN 1-2 DAYS, DEPENDING ON CLINICAL CONDITION.   Houston SirenSAINANI,Tarae Wooden J M.D on 10/29/2014 at 12:49 PM  Between 7am to 6pm - Pager - [phone]  After 6pm go to [email or web address] - password [email or web address] [facility]  [facility] [place] [facility]  Office  [phone]  CC: Primary care physician; No primary care provider on file.

## 2014-10-29 NOTE — Progress Notes (Signed)
CBG 196 per glucometer.

## 2014-10-29 NOTE — Progress Notes (Signed)
Dr. Thedore MinsSingh at bedside. Notified of potassium level. Wants to recheck level before intervening.

## 2014-10-29 NOTE — Progress Notes (Signed)
Subjective:   States he is doing well. No acute events reported by nursing No specific c/o today Serum creatinine improved to 1.18  down from 1.29 . Urine out put 4125c cc  Objective:  Vital signs in last 24 hours:  Temp:  [98.1 F (36.7 C)-98.6 F (37 C)] 98.4 F (36.9 C) (10/22 1138) Pulse Rate:  [91-105] 105 (10/22 1138) Resp:  [19-20] 19 (10/22 1138) BP: (173-191)/(70-80) 175/72 mmHg (10/22 1138) SpO2:  [98 %] 98 % (10/22 1138) Weight:  [123.56 kg (272 lb 6.4 oz)] 123.56 kg (272 lb 6.4 oz) (10/22 0500)  Weight change: -1.74 kg (-3 lb 13.4 oz) Filed Weights   10/27/14 0435 10/28/14 0450 10/29/14 0500  Weight: 126.5 kg (278 lb 14.1 oz) 125.3 kg (276 lb 3.8 oz) 123.56 kg (272 lb 6.4 oz)    Intake/Output:    Intake/Output Summary (Last 24 hours) at 10/29/14 1308 Last data filed at 10/29/14 1233  Gross per 24 hour  Intake      3 ml  Output   4050 ml  Net  -4047 ml     Physical Exam: General: NAD, laying in bed  HEENT anicteric  Neck supple  Pulm/lungs Normal effort  CVS/Heart paced rhythm  Abdomen:  Soft, distended, [place] edema  Extremities: ++ dependent edema  Neurologic: alert  Skin: No rashes  Access:        Basic Metabolic Panel:   Recent Labs Lab 10/25/14 1534 10/25/14 2344 10/26/14 0451 10/26/14 0758 10/26/14 1156 10/27/14 0407 10/27/14 0753 10/29/14 0454 10/29/14 1126  NA 135 135  134* 137 137 135 136 137 137  --   K 5.5* 5.3*  5.2* 4.8 4.6 4.8 5.0 5.0 5.9* 5.5*  CL 101 101  101 103 101 101 102 102 104  --   CO2 --   GLUCOSE 92 112*  112* 112* 99 150* 135* 123* 102*  --   BUN 67* 56*  57* 50* 45* 40* 32* 30* 23*  --   CREATININE 2.57* 1.87*  1.87* 1.58* 1.45* 1.41* 1.33* 1.29* 1.18  --   CALCIUM 8.3* 8.6*  8.5* 8.6* 8.5* 8.3* 8.8* 8.8* 9.4  --   MG 2.4 2.3 2.1 2.1 2.0 2.0  --   --   --   PHOS 4.8* 4.3 3.9 3.4 2.9  --   --   --   --      CBC:  Recent Labs Lab 10/24/14 1607 10/24/14 2200  10/26/14 0449 10/27/14 0407  WBC 6.6 5.8 4.3 5.6  HGB 9.3* 9.6* 9.5* 9.2*  HCT 28.8* 30.4* 29.9* 29.1*  MCV 89.3 89.0 89.1 88.9  PLT 118* 90* 84* 91*      Microbiology:  Recent Results (from the past 720 hour(s))  MRSA PCR Screening     Status: None   Collection Time: 10/12/14  5:25 AM  Result Value Ref Range Status   MRSA by PCR NEGATIVE NEGATIVE Final    Comment:        The GeneXpert MRSA Assay (FDA approved for NASAL specimens only), is one component of a comprehensive MRSA colonization surveillance program. It is not intended to diagnose MRSA infection nor to guide or monitor treatment for MRSA infections.   MRSA PCR Screening     Status: Abnormal   Collection Time: 10/24/14 10:00 PM  Result Value Ref Range Status   MRSA by PCR POSITIVE (A) NEGATIVE Final    Comment: CRITICAL RESULT  CALLED TO, READ BACK BY AND VERIFIED WITH: RENEE BABBS ON 10/25/14 AT 0049 BY TB.        The GeneXpert MRSA Assay (FDA approved for NASAL specimens only), is one component of a comprehensive MRSA colonization surveillance program. It is not intended to diagnose MRSA infection nor to guide or monitor treatment for MRSA infections.     Coagulation Studies: No results for input(s): LABPROT, INR in the last 72 hours.  Urinalysis: No results for input(s): COLORURINE, LABSPEC, PHURINE, GLUCOSEU, HGBUR, BILIRUBINUR, KETONESUR, PROTEINUR, UROBILINOGEN, NITRITE, LEUKOCYTESUR in the last 72 hours.  Invalid input(s): APPERANCEUR    Imaging: Nm Pulmonary Perf And Vent  10/27/2014  CLINICAL DATA:  Acute respiratory failure with hypoxemia, COPD, chronic diastolic CHF, coronary artery disease, diabetes mellitus, hypertension, stage III chronic kidney disease EXAM: NUCLEAR MEDICINE VENTILATION - PERFUSION LUNG SCAN TECHNIQUE: Ventilation images were obtained in anterior and posterior projections using inhaled aerosol Tc-25m DTPA. Perfusion images were obtained in anterior and posterior  projections after intravenous injection of Tc-64m MAA. Patient was unable to tolerate additional imaging/remaining projections on both the ventilatory and perfusion exams. RADIOPHARMACEUTICALS:  42.95 mCi Technetium-22m DTPA aerosol inhalation and 4.65 mCi Technetium-10m MAA IV COMPARISON:  None; correlation chest radiograph 10/27/2014 FINDINGS: Ventilation: Significant central airway deposition of tracer. Absent ventilation LEFT lower lobe. Significantly diminished ventilation RIGHT lower lobe. Perfusion: Mild diminished perfusion at lateral aspect of RIGHT lower lobe on the posterior view. Significantly better RIGHT lower lobe perfusion than ventilation. Remaining perfusion in both lungs is normal on both the anterior and posterior views. Chest radiograph: Enlargement of cardiac silhouette with pulmonary vascular congestion, perihilar edema, bibasilar effusions and bibasilar atelectasis. IMPRESSION: Absent LEFT lower lobe and significantly decreased RIGHT lower lobe ventilation. Mild diminished perfusion at lateral aspect of RIGHT lower lobe, much less severe than accompanying ventilatory findings and much better than anticipated from accompanying chest radiograph. No other perfusion abnormalities are identified. Although the study is limited, findings are not suspicious for pulmonary embolism. Electronically Signed   By: Ulyses Southward M.D.   On: 10/27/2014 15:27     Medications:     . amLODipine  10 mg Oral Daily  . antiseptic oral rinse  7 mL Mouth Rinse BID  . aspirin EC  81 mg Oral Daily  . Chlorhexidine Gluconate Cloth  6 each Topical Q0600  . divalproex  500 mg Oral Q12H  . docusate sodium  100 mg Oral BID  . finasteride  5 mg Oral Daily  . heparin  5,000 Units Subcutaneous 3 times per day  . insulin aspart  0-9 Units Subcutaneous 6 times per day  . metoprolol succinate  100 mg Oral Daily  . senna-docusate  1 tablet Oral Daily  . sertraline  25 mg Oral Daily  . simvastatin  30 mg Oral QHS   . sodium chloride  3 mL Intravenous Q12H  . tamsulosin  0.4 mg Oral Daily  . torsemide  40 mg Oral Daily     Assessment/ Plan:  80 y.o. male with h/o CKD, CHF, COPD, CAD, DM with CKD, GERD presents with Shortness of brath from [facility]. [place] edema, Non rebreather, bradycardic, required cardiac Pacer  1. ARF, unspecified, likely from concurrent illness - No further need for HD is anticipated - UOP > 4 liter - Dialysis catheter has been removed   2. Hyperkalemia - affected cardiac rhythm - d/c Kcl supplements and do not restart outpatient again - K lenoted to be high again this morning  for repeat level was 5.5  3. Anasarca - recent echo with normal EF, diastolic dysfunction - UOP has improved  4. Hypertension Will restart metoprolol, received dose this morning Will evaluate tomorrow      LOS: 5 Rumor Sun 10/22/20161:08 PM

## 2014-10-29 NOTE — Progress Notes (Signed)
Dr. Cherlynn KaiserSainani aware that patient has not had a BM in several days. Already on some PO medications. Per MD, order dulcolax suppository once.

## 2014-10-29 NOTE — Progress Notes (Signed)
Dr. Cherlynn KaiserSainani aware of Potassium result. No intervention at this time.

## 2014-10-30 LAB — BASIC METABOLIC PANEL
Anion gap: 4 — ABNORMAL LOW (ref 5–15)
BUN: 27 mg/dL — AB (ref 6–20)
CHLORIDE: 103 mmol/L (ref 101–111)
CO2: 29 mmol/L (ref 22–32)
CREATININE: 1.17 mg/dL (ref 0.61–1.24)
Calcium: 9.2 mg/dL (ref 8.9–10.3)
GFR, EST NON AFRICAN AMERICAN: 57 mL/min — AB (ref >60)
Glucose, Bld: 111 mg/dL — ABNORMAL HIGH (ref 65–99)
POTASSIUM: 5.6 mmol/L — AB (ref 3.5–5.1)
Sodium: 136 mmol/L (ref 135–145)

## 2014-10-30 LAB — CK: CK TOTAL: 137 U/L (ref 49–397)

## 2014-10-30 LAB — GLUCOSE, CAPILLARY
GLUCOSE-CAPILLARY: 126 mg/dL — AB (ref 65–99)
GLUCOSE-CAPILLARY: 160 mg/dL — AB (ref 65–99)
Glucose-Capillary: 110 mg/dL — ABNORMAL HIGH (ref 65–99)
Glucose-Capillary: 155 mg/dL — ABNORMAL HIGH (ref 65–99)
Glucose-Capillary: 235 mg/dL — ABNORMAL HIGH (ref 65–99)
Glucose-Capillary: 269 mg/dL — ABNORMAL HIGH (ref 65–99)

## 2014-10-30 MED ORDER — HYDROCHLOROTHIAZIDE 12.5 MG PO CAPS
12.5000 mg | ORAL_CAPSULE | Freq: Every day | ORAL | Status: DC
  Administered 2014-10-30 – 2014-10-31 (×2): 12.5 mg via ORAL
  Filled 2014-10-30 (×2): qty 1

## 2014-10-30 MED ORDER — CLONIDINE HCL 0.1 MG PO TABS
0.2000 mg | ORAL_TABLET | Freq: Three times a day (TID) | ORAL | Status: DC
  Administered 2014-10-30 – 2014-10-31 (×4): 0.2 mg via ORAL
  Filled 2014-10-30 (×4): qty 2

## 2014-10-30 NOTE — Plan of Care (Signed)
Problem: Phase III Progression Outcomes Goal: < 1 kg Weight Gain for 24 hrs. Pt is alert and oriented x 4, bed bound at this time, moderate assist, sleeping in between care, good appetite, bm throughout shift, nephew visiting at bedside. Continues on 2 L oxygen, vital signs stable, htn medications reintroduced, started on hydrochlorothiazide for potassium waisting effect, potassium elevated but improving, foley in place but will likely be discontinued tomorrow. Heart rate stable, wound care performed throughout shift.

## 2014-10-30 NOTE — Progress Notes (Signed)
FSBS 172 at 16: 10 per NT Anna A.

## 2014-10-30 NOTE — Progress Notes (Signed)
Per MD patient is not medically stable for D/C today and will likely be ready Monday. [facility]. Clinical Social Worker (CSW) will continue to follow and assist as needed.   Jetta LoutBailey Morgan, LCSWA [phone]

## 2014-10-30 NOTE — Progress Notes (Signed)
Subjective:   States he is doing well. No acute events reported by nursing No specific c/o today. Reports some abdominal soreness Serum creatinine improved to 1. 17 staying stable . Urine out put 3100 cc However, potassium level still slightly high at 5.6  Objective:  Vital signs in last 24 hours:  Temp:  [97.8 F (36.6 C)-98.2 F (36.8 C)] 98 F (36.7 C) (10/23 1128) Pulse Rate:  [95-96] 96 (10/23 1128) Resp:  [17-20] 17 (10/23 1128) BP: (159-185)/(56-74) 159/63 mmHg (10/23 1128) SpO2:  [97 %-98 %] 98 % (10/23 1128) Weight:  [119.84 kg (264 lb 3.2 oz)] 119.84 kg (264 lb 3.2 oz) (10/23 0418)  Weight change: -3.719 kg (-8 lb 3.2 oz) Filed Weights   10/28/14 0450 10/29/14 0500 10/30/14 0418  Weight: 125.3 kg (276 lb 3.8 oz) 123.56 kg (272 lb 6.4 oz) 119.84 kg (264 lb 3.2 oz)    Intake/Output:    Intake/Output Summary (Last 24 hours) at 10/30/14 1151 Last data filed at 10/30/14 1130  Gross per 24 hour  Intake      0 ml  Output   3100 ml  Net  -3100 ml     Physical Exam: General: NAD, laying in bed  HEENT anicteric  Neck supple  Pulm/lungs Normal effort  CVS/Heart paced rhythm  Abdomen:  Soft, distended, [place] edema  Extremities: ++ dependent edema  Neurologic: alert  Skin: No rashes  Access:     Foley catheter present     Basic Metabolic Panel:   Recent Labs Lab 10/25/14 1534 10/25/14 2344 10/26/14 0451 10/26/14 0758 10/26/14 1156 10/27/14 0407 10/27/14 0753 10/29/14 0454 10/29/14 1126 10/30/14 0427  NA 135 135  134* 137 137 135 136 137 137  --  136  K 5.5* 5.3*  5.2* 4.8 4.6 4.8 5.0 5.0 5.9* 5.5* 5.6*  CL 101 101  101 103 101 101 102 102 104  --  103  CO2 28 30  29 30 31 30 29 29 28   --  29  GLUCOSE 92 112*  112* 112* 99 150* 135* 123* 102*  --  111*  BUN 67* 56*  57* 50* 45* 40* 32* 30* 23*  --  27*  CREATININE 2.57* 1.87*  1.87* 1.58* 1.45* 1.41* 1.33* 1.29* 1.18  --  1.17  CALCIUM 8.3* 8.6*  8.5* 8.6* 8.5* 8.3* 8.8* 8.8* 9.4  --  9.2   MG 2.4 2.3 2.1 2.1 2.0 2.0  --   --   --   --   PHOS 4.8* 4.3 3.9 3.4 2.9  --   --   --   --   --      CBC:  Recent Labs Lab 10/24/14 1607 10/24/14 2200 10/26/14 0449 10/27/14 0407  WBC 6.6 5.8 4.3 5.6  HGB 9.3* 9.6* 9.5* 9.2*  HCT 28.8* 30.4* 29.9* 29.1*  MCV 89.3 89.0 89.1 88.9  PLT 118* 90* 84* 91*      Microbiology:  Recent Results (from the past 720 hour(s))  MRSA PCR Screening     Status: None   Collection Time: 10/12/14  5:25 AM  Result Value Ref Range Status   MRSA by PCR NEGATIVE NEGATIVE Final    Comment:        The GeneXpert MRSA Assay (FDA approved for NASAL specimens only), is one component of a comprehensive MRSA colonization surveillance program. It is not intended to diagnose MRSA infection nor to guide or monitor treatment for MRSA infections.   MRSA PCR Screening  Status: Abnormal   Collection Time: 10/24/14 10:00 PM  Result Value Ref Range Status   MRSA by PCR POSITIVE (A) NEGATIVE Final    Comment: CRITICAL RESULT CALLED TO, READ BACK BY AND VERIFIED WITH: RENEE BABBS ON 10/25/14 AT 0049 BY TB.        The GeneXpert MRSA Assay (FDA approved for NASAL specimens only), is one component of a comprehensive MRSA colonization surveillance program. It is not intended to diagnose MRSA infection nor to guide or monitor treatment for MRSA infections.     Coagulation Studies: No results for input(s): LABPROT, INR in the last 72 hours.  Urinalysis: No results for input(s): COLORURINE, LABSPEC, PHURINE, GLUCOSEU, HGBUR, BILIRUBINUR, KETONESUR, PROTEINUR, UROBILINOGEN, NITRITE, LEUKOCYTESUR in the last 72 hours.  Invalid input(s): APPERANCEUR    Imaging: No results found.   Medications:     . amLODipine  10 mg Oral Daily  . antiseptic oral rinse  7 mL Mouth Rinse BID  . aspirin EC  81 mg Oral Daily  . cloNIDine  0.2 mg Oral TID  . divalproex  500 mg Oral Q12H  . docusate sodium  100 mg Oral BID  . finasteride  5 mg Oral Daily   . heparin  5,000 Units Subcutaneous 3 times per day  . hydrochlorothiazide  12.5 mg Oral Daily  . insulin aspart  0-9 Units Subcutaneous 6 times per day  . metoprolol succinate  100 mg Oral Daily  . senna-docusate  1 tablet Oral Daily  . sertraline  25 mg Oral Daily  . simvastatin  30 mg Oral QHS  . sodium chloride  3 mL Intravenous Q12H  . tamsulosin  0.4 mg Oral Daily  . torsemide  40 mg Oral Daily     Assessment/ Plan:  81 y.o. male with h/o CKD, CHF, COPD, CAD, DM with CKD, GERD presents with Shortness of brath from [facility] [place] edema, Non rebreather, bradycardic, required cardiac Pacer  1. 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 Hyperkalemia - affected cardiac rhythm - d/c Kcl supplements and do not restart outpatient again - K noted to be high again this morning at 5.6 - Continue low potassium diet and consider adding low-dose hydrochlorothiazide  3. Anasarca - recent echo with normal EF, diastolic dysfunction - UOP has improved  4. Hypertension Multiple antihypertensives Clonidine was restarted today We will consider adding ACE inhibitor once it is determined that the potassium levels are stable     LOS: 6 Apostolos Blagg 10/23/201611:51 AM

## 2014-10-30 NOTE — Progress Notes (Addendum)
[facility] [place] at [facility]   PATIENT NAME: Billy BeamLawrence Neuman    MR#:  [id number]  DATE OF BIRTH:  09/20/1932  SUBJECTIVE:  CHIEF COMPLAINT:   Chief Complaint  Patient presents with  . Bradycardia   Patient presented to the hospital due to acute on chronic renal failure and noted to be severely hypokalemic and also noted to be bradycardic. Renal function back to baseline.  Potassium still a bit elevated.  No acute events overnight.    REVIEW OF SYSTEMS:    Review of Systems  Constitutional: Negative for fever and chills.  HENT: Negative for congestion and tinnitus.   Eyes: Negative for blurred vision and double vision.  Respiratory: Negative for cough, shortness of breath and wheezing.   Cardiovascular: Negative for chest pain, orthopnea and PND.  Gastrointestinal: Negative for nausea, vomiting, abdominal pain and diarrhea.  Genitourinary: Negative for dysuria and hematuria.  Neurological: Positive for weakness (generalized). Negative for dizziness, sensory change and focal weakness.  All other systems reviewed and are negative.   Nutrition: Renal Diet Tolerating Diet: Yes Tolerating PT: Wheelchair bound at baseline.    DRUG ALLERGIES:   Allergies  Allergen Reactions  . Penicillins Other (See Comments)    Reaction:  Unknown     VITALS:  Blood pressure 159/63, pulse 96, temperature 98 F (36.7 C), temperature source Oral, resp. rate 17, height 6\' 3"  (1.905 m), weight 119.84 kg (264 lb 3.2 oz), SpO2 98 %.  PHYSICAL EXAMINATION:   Physical Exam  GENERAL:  81 y.o.-year-old obese patient lying in the bed in no acute distress.  EYES: Pupils equal, round, reactive to light and accommodation. No scleral icterus. Extraocular muscles intact.  HEENT: Head atraumatic, normocephalic. Oropharynx and nasopharynx clear.  NECK:  Supple, no jugular venous distention. No thyroid enlargement, no tenderness.  LUNGS: Normal breath sounds bilaterally, no  wheezing, rales, rhonchi. No use of accessory muscles of respiration.  CARDIOVASCULAR: S1, S2 normal. No murmurs, rubs, or gallops.  ABDOMEN: Soft, nontender, nondistended. Bowel sounds present. No organomegaly or mass.  EXTREMITIES: No cyanosis, clubbing or edema b/l.  Anasarca, significant scrotal enlargement.  NEUROLOGIC: Cranial nerves II through XII are intact. No focal Motor or sensory deficits b/l. Globally weak and bed bound   PSYCHIATRIC: The patient is alert and oriented x 2. Good affect.  SKIN: No obvious rash, lesion, or ulcer.    LABORATORY PANEL:   CBC  Recent Labs Lab 10/27/14 0407  WBC 5.6  HGB 9.2*  HCT 29.1*  PLT 91*   ------------------------------------------------------------------------------------------------------------------  Chemistries   Recent Labs Lab 10/27/14 0407  10/30/14 0427  NA 136  < > 136  K 5.0  < > 5.6*  CL 102  < > 103  CO2 29  < > 29  GLUCOSE 135*  < > 111*  BUN 32*  < > 27*  CREATININE 1.33*  < > 1.17  CALCIUM 8.8*  < > 9.2  MG 2.0  --   --   AST 72*  --   --   ALT 142*  --   --   ALKPHOS 91  --   --   BILITOT 0.4  --   --   < > = values in this interval not displayed. ------------------------------------------------------------------------------------------------------------------  Cardiac Enzymes  Recent Labs Lab 10/25/14 0512  TROPONINI 0.42*   ------------------------------------------------------------------------------------------------------------------  RADIOLOGY:  No results found.   ASSESSMENT AND PLAN:   81 year old male with past medical history of chronic diastolic CHF,  COPD, coronary disease, type 2 diabetes, hypertension, chronic kidney disease, GERD, anxiety, hyperlipidemia who presented to the hospital due to bradycardia and noted to be in acute on chronic renal failure with severe hyperkalemia.  #1 acute on chronic renal failure-this was likely secondary to mild dehydration and also underlying  CHF.  -Given patient's hemodynamic instability, hyperkalemia initially he was on continuous dialysis. This has not been weaned off. Creatinine has improved. patient's urine output has also improved and stable. -Appreciate nephrology input and continue supportive care for now. Avoid nephrotoxins.  #2 hyperkalemia-improved and resolved as his renal function has improved. -Potassium today is 5.5 and discussed w/ Nephro and will start on low dose HCTZ to help with potassium wasting.    #3 bradycardia-this was likely secondary to hyperkalemia but has not resolved his potassium is been corrected. -Patient is hemodynamically stable.  #4 hypertension-BP a bit on high side.  - continue Norvasc, Toprol, as needed hydralazine.  Will resume Clonidine which he was taking prior to admission.    #5 BPH-continue finasteride, Flomax. - Status post Foley with significant scrotal enlargement due to anasarca.   - seen by Urology and as per them Foley can be removed prior to discharge. Will remove foley in a.m. And check post-void residual.   #6 type 2 diabetes without complication-continue sliding scale insulin.  #7 depression-continue Zoloft.  #8 CHF-acute on chronic diastolic dysfunction. -Continue torsemide, metoprolol.  Likely d/c to SNF tomorrow.    All the records are reviewed and case discussed with Care Management/Social Workerr. Management plans discussed with the patient, family and they are in agreement.  CODE STATUS: Full  DVT Prophylaxis: Heparin subcutaneous  TOTAL TIME TAKING CARE OF THIS PATIENT: 25 minutes.   POSSIBLE D/C IN 1-2 DAYS, DEPENDING ON CLINICAL CONDITION.   Houston Siren M.D on 10/30/2014 at 12:15 PM  Between 7am to 6pm - Pager - [phone]  After 6pm go to [email or web address] - password [email or web address] [facility]  [place] [facility]  Office  [phone]  CC: Primary care physician; No primary care provider on file.

## 2014-10-31 LAB — BASIC METABOLIC PANEL
Anion gap: 6 (ref 5–15)
BUN: 34 mg/dL — AB (ref 6–20)
CALCIUM: 8.9 mg/dL (ref 8.9–10.3)
CHLORIDE: 102 mmol/L (ref 101–111)
CO2: 29 mmol/L (ref 22–32)
CREATININE: 1.3 mg/dL — AB (ref 0.61–1.24)
GFR calc Af Amer: 58 mL/min — ABNORMAL LOW (ref >60)
GFR calc non Af Amer: 50 mL/min — ABNORMAL LOW (ref >60)
GLUCOSE: 139 mg/dL — AB (ref 65–99)
Potassium: 4.8 mmol/L (ref 3.5–5.1)
Sodium: 137 mmol/L (ref 135–145)

## 2014-10-31 LAB — GLUCOSE, CAPILLARY
Glucose-Capillary: 254 mg/dL — ABNORMAL HIGH (ref 65–99)
Glucose-Capillary: 107 mg/dL — ABNORMAL HIGH (ref 65–99)
Glucose-Capillary: 175 mg/dL — ABNORMAL HIGH (ref 65–99)

## 2014-10-31 MED ORDER — TORSEMIDE 20 MG PO TABS: 40.0000 mg | ORAL_TABLET | Freq: Every day | ORAL | Status: DC

## 2014-10-31 MED ORDER — FINASTERIDE 5 MG PO TABS: 5.0000 mg | ORAL_TABLET | Freq: Every day | ORAL | Status: DC

## 2014-10-31 MED ORDER — CLONIDINE HCL 0.2 MG PO TABS: 0.2000 mg | ORAL_TABLET | Freq: Three times a day (TID) | ORAL | Status: DC

## 2014-10-31 MED ORDER — HYDROCHLOROTHIAZIDE 12.5 MG PO CAPS: 12.5000 mg | ORAL_CAPSULE | Freq: Every day | ORAL | Status: DC

## 2014-10-31 NOTE — Progress Notes (Signed)
Subjective:  Pt seen at bedside.  Cr currently 1.30.   K down to 4.8 this AM.   Objective:  Vital signs in last 24 hours:  Temp:  [97.8 F (36.6 C)-98.3 F (36.8 C)] 97.8 F (36.6 C) (10/24 1155) Pulse Rate:  [76-84] 79 (10/24 1155) Resp:  [18-20] 18 (10/24 1155) BP: (141-167)/(53-61) 145/59 mmHg (10/24 1155) SpO2:  [96 %-98 %] 97 % (10/24 1155) Weight:  [118.797 kg (261 lb 14.4 oz)] 118.797 kg (261 lb 14.4 oz) (10/24 0433)  Weight change: -1.043 kg (-2 lb 4.8 oz) Filed Weights   10/29/14 0500 10/30/14 0418 10/31/14 0433  Weight: 123.56 kg (272 lb 6.4 oz) 119.84 kg (264 lb 3.2 oz) 118.797 kg (261 lb 14.4 oz)    Intake/Output:    Intake/Output Summary (Last 24 hours) at 10/31/14 1234 Last data filed at 10/31/14 1120  Gross per 24 hour  Intake      3 ml  Output   2100 ml  Net  -2097 ml     Physical Exam: General: NAD, laying in bed  HEENT anicteric  Neck supple  Pulm/lungs Normal effort CTAB  CVS/Heart paced rhythm S1S2  Abdomen:  Soft, distended, BS present  Extremities: ++ dependent edema  Neurologic: alert  Skin: No rashes           Basic Metabolic Panel:   Recent Labs Lab 10/25/14 1534 10/25/14 2344 10/26/14 0451 10/26/14 0758 10/26/14 1156 10/27/14 0407 10/27/14 0753 10/29/14 0454 10/29/14 1126 10/30/14 0427 10/31/14 0939  NA 135 135  134* 137 137 135 136 137 137  --  136 137  K 5.5* 5.3*  5.2* 4.8 4.6 4.8 5.0 5.0 5.9* 5.5* 5.6* 4.8  CL 101 101  101 103 101 101 102 102 104  --  103 102  CO2 --  29 29  GLUCOSE 92 112*  112* 112* 99 150* 135* 123* 102*  --  111* 139*  BUN 67* 56*  57* 50* 45* 40* 32* 30* 23*  --  27* 34*  CREATININE 2.57* 1.87*  1.87* 1.58* 1.45* 1.41* 1.33* 1.29* 1.18  --  1.17 1.30*  CALCIUM 8.3* 8.6*  8.5* 8.6* 8.5* 8.3* 8.8* 8.8* 9.4  --  9.2 8.9  MG 2.4 2.3 2.1 2.1 2.0 2.0  --   --   --   --   --   PHOS 4.8* 4.3 3.9 3.4 2.9  --   --   --   --   --   --      CBC:  Recent  Labs Lab 10/24/14 1607 10/24/14 2200 10/26/14 0449 10/27/14 0407  WBC 6.6 5.8 4.3 5.6  HGB 9.3* 9.6* 9.5* 9.2*  HCT 28.8* 30.4* 29.9* 29.1*  MCV 89.3 89.0 89.1 88.9  PLT 118* 90* 84* 91*      Microbiology:  Recent Results (from the past 720 hour(s))  MRSA PCR Screening     Status: None   Collection Time: 10/12/14  5:25 AM  Result Value Ref Range Status   MRSA by PCR NEGATIVE NEGATIVE Final    Comment:        The GeneXpert MRSA Assay (FDA approved for NASAL specimens only), is one component of a comprehensive MRSA colonization surveillance program. It is not intended to diagnose MRSA infection nor to guide or monitor treatment for MRSA infections.   MRSA PCR Screening     Status: Abnormal   Collection Time: 10/24/14 10:00  PM  Result Value Ref Range Status   MRSA by PCR POSITIVE (A) NEGATIVE Final    Comment: CRITICAL RESULT CALLED TO, READ BACK BY AND VERIFIED WITH: RENEE BABBS ON 10/25/14 AT 0049 BY TB.        The GeneXpert MRSA Assay (FDA approved for NASAL specimens only), is one component of a comprehensive MRSA colonization surveillance program. It is not intended to diagnose MRSA infection nor to guide or monitor treatment for MRSA infections.     Coagulation Studies: No results for input(s): LABPROT, INR in the last 72 hours.  Urinalysis: No results for input(s): COLORURINE, LABSPEC, PHURINE, GLUCOSEU, HGBUR, BILIRUBINUR, KETONESUR, PROTEINUR, UROBILINOGEN, NITRITE, LEUKOCYTESUR in the last 72 hours.  Invalid input(s): APPERANCEUR    Imaging: No results found.   Medications:     . amLODipine  10 mg Oral Daily  . antiseptic oral rinse  7 mL Mouth Rinse BID  . aspirin EC  81 mg Oral Daily  . cloNIDine  0.2 mg Oral TID  . divalproex  500 mg Oral Q12H  . docusate sodium  100 mg Oral BID  . finasteride  5 mg Oral Daily  . heparin  5,000 Units Subcutaneous 3 times per day  . hydrochlorothiazide  12.5 mg Oral Daily  . insulin aspart  0-9  Units Subcutaneous 6 times per day  . metoprolol succinate  100 mg Oral Daily  . senna-docusate  1 tablet Oral Daily  . sertraline  25 mg Oral Daily  . simvastatin  30 mg Oral QHS  . sodium chloride  3 mL Intravenous Q12H  . tamsulosin  0.4 mg Oral Daily  . torsemide  40 mg Oral Daily     Assessment/ Plan:  81 y.o. male with h/o CKD, CHF, COPD, CAD, DM with CKD, GERD [facility]. [place] edema, Non rebreather, bradycardic, required cardiac Pacer  1. ARF, ATN, likely from concurrent illness. Required emergent dialysis for hyperkalemia and severe acute renal failure - Pt seen at bedside, Cr slightly higher today at 1.3.  Overall renal function significantly improved however.    2. Hyperkalemia - K down to 4.8 today, improved from yesterday.  Continue HCTZ for kaliuresis.  Would not restart on K supplementation as outpt.  3. Anasarca/generalized edema. - continue torsemide at reduced dose.  4. Hypertension BP currently 145/59, continue amlodipine, clonidine, HCTZ, metoprolol, and torsemide for now.  Could consider adding hydralazine if BP remains high.      LOS: 7 Jazarah Capili 10/24/201612:34 PM

## 2014-10-31 NOTE — Progress Notes (Signed)
Spoke with Elmarie Shileyiffany, [employer] rep at [phone], to notify of non-emergent EMS transport.  Auth notification reference given as [id number].   Service date range good from 10/31/14 - 01/29/15.   Gap exception requested to determine if services can be considered at an in-network level.

## 2014-10-31 NOTE — Discharge Summary (Signed)
[facility] [place] at [facility]   PATIENT NAME: Billy Liu    MR#:  [id number]  DATE OF BIRTH:  Nov 04, 1932  DATE OF ADMISSION:  10/24/2014 ADMITTING PHYSICIAN: Enedina Finner, MD  DATE OF DISCHARGE: 10/31/2014  PRIMARY CARE PHYSICIAN: No primary care provider on file.    ADMISSION DIAGNOSIS:  Hyperkalemia [E87.5] Bradycardia [R00.1] Renal failure [N19] Hypotension, unspecified hypotension type [I95.9]  DISCHARGE DIAGNOSIS:  Active Problems:   Acute hyperkalemia   SECONDARY DIAGNOSIS:   Past Medical History  Diagnosis Date  . Chronic diastolic CHF (congestive heart failure) (HCC)   . COPD (chronic obstructive pulmonary disease) (HCC)   . Coronary artery disease   . Diabetes mellitus without complication (HCC)   . Hypertension   . Renal disorder   . GERD (gastroesophageal reflux disease)   . Hyperlipemia   . Anxiety   . Obesity   . CKD (chronic kidney disease), stage III     HOSPITAL COURSE:   81 year old male with past medical history of chronic diastolic CHF, COPD, coronary disease, type 2 diabetes, hypertension, chronic kidney disease, GERD, anxiety, hyperlipidemia who presented to the hospital due to bradycardia and noted to be in acute on chronic renal failure with severe hyperkalemia.  #1 acute on chronic renal failure-this was likely secondary to mild dehydration and also underlying CHF.  -Given patient's hemodynamic instability, hyperkalemia initially he was on continuous dialysis. This has now been weaned off. Creatinine has improved. patient's urine output has also improved and is currently stable. -Patient's renal function is now back to baseline. Patient will follow-up with nephrology as an outpatient.  #2 hyperkalemia-this was likely secondary to the acute renal failure and has not improved and resolved.  -Potassium on the day of discharge is 4.8.  #3 bradycardia-this was likely secondary to hyperkalemia but has now  resolved as his potassium is now corrected. -Patient is hemodynamically stable presently.  #4 hypertension-patient's blood pressure has been somewhat labile but improved. -Given his hyperkalemia I will discontinue his ACE inhibitor for now. Continue Norvasc, Toprol, hydralazine, clonidine.   #5 BPH-continue finasteride/Flomax. Patient had some urinary retention with enlarged scrotum while in the hospital. He was seen by urology. A Foley catheter was placed which has not been removed. He has been able to void. His scrotum is slightly enlarged due to hydroceles and he can continue follow-up with urology as an outpatient.  #6 type 2 diabetes without complication-while in the hospital patient will sliding scale insulin. Although now since his by mouth intake is improved he will resume his Levemir and NovoLog with meals.  #7 depression-patient will continue Zoloft.  #8 CHF-acute on chronic diastolic dysfunction. -Clinically patient was not in CHF on the hospital. He will Continue torsemide, metoprolol.   DISCHARGE CONDITIONS:   Stable  CONSULTS OBTAINED:  Treatment Team:  Dalia Heading, MD Renford Dills, MD Sebastian Ache, MD  DRUG ALLERGIES:   Allergies  Allergen Reactions  . Penicillins Other (See Comments)    Reaction:  Unknown     DISCHARGE MEDICATIONS:   Current Discharge Medication List    START taking these medications   Details  finasteride (PROSCAR) 5 MG tablet Take 1 tablet (5 mg total) by mouth daily.    hydrochlorothiazide (MICROZIDE) 12.5 MG capsule Take 1 capsule (12.5 mg total) by mouth daily.      CONTINUE these medications which have CHANGED   Details  cloNIDine (CATAPRES) 0.2 MG tablet Take 1 tablet (0.2 mg total) by mouth 3 (  three) times daily. Qty: 60 tablet, Refills: 11    torsemide (DEMADEX) 20 MG tablet Take 2 tablets (40 mg total) by mouth daily.      CONTINUE these medications which have NOT CHANGED   Details  acetaminophen (TYLENOL)  500 MG tablet Take 500 mg by mouth 3 (three) times daily.    amLODipine (NORVASC) 10 MG tablet Take 10 mg by mouth daily.    aspirin EC 81 MG tablet Take 81 mg by mouth daily.    divalproex (DEPAKOTE ER) 500 MG 24 hr tablet Take 1,000 mg by mouth at bedtime.    gabapentin (NEURONTIN) 100 MG capsule Take 100 mg by mouth at bedtime.     glucagon, human recombinant, (GLUCAGEN DIAGNOSTIC) 1 MG injection Inject 1 mg into the muscle once as needed for low blood sugar.    hydrALAZINE (APRESOLINE) 25 MG tablet Take 3 tablets (75 mg total) by mouth every 6 (six) hours. Qty: 90 tablet, Refills: 0    insulin detemir (LEVEMIR) 100 UNIT/ML injection Inject 24-36 Units into the skin 2 (two) times daily. Pt uses 36 units in the morning and 24 units at bedtime.    insulin lispro (HUMALOG) 100 UNIT/ML injection Inject 12 Units into the skin 2 (two) times daily with breakfast and lunch. Hold if blood sugar is less than 100.    metoprolol succinate (TOPROL-XL) 100 MG 24 hr tablet Take 100 mg by mouth daily.    senna (SENOKOT) 8.6 MG TABS tablet Take 2 tablets by mouth at bedtime.    sertraline (ZOLOFT) 25 MG tablet Take 25 mg by mouth daily.    !! simvastatin (ZOCOR) 10 MG tablet Take 10 mg by mouth at bedtime. Pt takes with a  tablet.    !! simvastatin (ZOCOR) 20 MG tablet Take 20 mg by mouth at bedtime. Pt takes with a  tablet.    Vitamin D, Ergocalciferol, (DRISDOL) 50000 UNITS CAPS capsule Take 50,000 Units by mouth every 30 (thirty) days. Pt takes on the 3rd of every month.     !! - Potential duplicate medications found. Please discuss with provider.    STOP taking these medications     cephALEXin (KEFLEX) 500 MG capsule      lisinopril (PRINIVIL,ZESTRIL) 20 MG tablet      potassium chloride SA (K-DUR,KLOR-CON) 20 MEQ tablet          DISCHARGE INSTRUCTIONS:   DIET:  Cardiac diet, Diabetic diet and Renal diet  DISCHARGE CONDITION:  Stable  ACTIVITY:  Activity as  tolerated  OXYGEN:  Home Oxygen: No.   Oxygen Delivery: room air  DISCHARGE LOCATION:  nursing home   If you experience worsening of your admission symptoms, develop shortness of breath, life threatening emergency, suicidal or homicidal thoughts you must seek medical attention immediately by calling 911 or calling your MD immediately  if symptoms less severe.  You Must read complete instructions/literature along with all the possible adverse reactions/side effects for all the Medicines you take and that have been prescribed to you. Take any new Medicines after you have completely understood and accpet all the possible adverse reactions/side effects.   Please note  You were cared for by a hospitalist during your hospital stay. If you have any questions about your discharge medications or the care you received while you were in the hospital after you are discharged, you can call the unit and asked to speak with the hospitalist on call if the hospitalist that took care of you is not  available. Once you are discharged, your primary care physician will handle any further medical issues. Please note that NO REFILLS for any discharge medications will be authorized once you are discharged, as it is imperative that you return to your primary care physician (or establish a relationship with a primary care physician if you do not have one) for your aftercare needs so that they can reassess your need for medications and monitor your lab values.     Today   Patient is clinically doing well. No acute events overnight. Potassium has improved. Urinating well.  VITAL SIGNS:  Blood pressure 158/61, pulse 76, temperature 98.3 F (36.8 C), temperature source Oral, resp. rate 20, height 6\' 3"  (1.905 m), weight 118.797 kg (261 lb 14.4 oz), SpO2 98 %.  I/O:    Intake/Output Summary (Last 24 hours) at 10/31/14 1144 Last data filed at 10/31/14 1120  Gross per 24 hour  Intake      3 ml  Output   2100 ml   Net  -2097 ml    PHYSICAL EXAMINATION:   GENERAL: 81 y.o.-year-old obese patient lying in the bed in no acute distress.  EYES: Pupils equal, round, reactive to light and accommodation. No scleral icterus. Extraocular muscles intact.  HEENT: Head atraumatic, normocephalic. Oropharynx and nasopharynx clear.  NECK: Supple, no jugular venous distention. No thyroid enlargement, no tenderness.  LUNGS: Normal breath sounds bilaterally, no wheezing, rales, rhonchi. No use of accessory muscles of respiration.  CARDIOVASCULAR: S1, S2 normal. No murmurs, rubs, or gallops.  ABDOMEN: Soft, nontender, nondistended. Bowel sounds present. No organomegaly or mass.  EXTREMITIES: No cyanosis, clubbing or edema b/l. Anasarca, significant scrotal enlargement.  NEUROLOGIC: Cranial nerves II through XII are intact. No focal Motor or sensory deficits b/l. Globally weak and bed bound  PSYCHIATRIC: The patient is alert and oriented x 2. Good affect.  SKIN: No obvious rash, lesion, or ulcer.   DATA REVIEW:   CBC  Recent Labs Lab 10/27/14 0407  WBC 5.6  HGB 9.2*  HCT 29.1*  PLT 91*    Chemistries   Recent Labs Lab 10/27/14 0407  10/31/14 0939  NA 136  < > 137  K 5.0  < > 4.8  CL 102  < > 102  CO2 29  < > 29  GLUCOSE 135*  < > 139*  BUN 32*  < > 34*  CREATININE 1.33*  < > 1.30*  CALCIUM 8.8*  < > 8.9  MG 2.0  --   --   AST 72*  --   --   ALT 142*  --   --   ALKPHOS 91  --   --   BILITOT 0.4  --   --   < > = values in this interval not displayed.  Cardiac Enzymes  Recent Labs Lab 10/25/14 0512  TROPONINI 0.42*    Microbiology Results  Results for orders placed or performed during the hospital encounter of 10/24/14  MRSA PCR Screening     Status: Abnormal   Collection Time: 10/24/14 10:00 PM  Result Value Ref Range Status   MRSA by PCR POSITIVE (A) NEGATIVE Final    Comment: CRITICAL RESULT CALLED TO, READ BACK BY AND VERIFIED WITH: RENEE BABBS ON 10/25/14 AT 0049  BY TB.        The GeneXpert MRSA Assay (FDA approved for NASAL specimens only), is one component of a comprehensive MRSA colonization surveillance program. It is not intended to diagnose MRSA infection nor to guide or  monitor treatment for MRSA infections.     RADIOLOGY:  No results found.    Management plans discussed with the patient, family and they are in agreement.  CODE STATUS:     Code Status Orders        Start     Ordered   10/24/14 1935  Full code   Continuous     10/24/14 1934    Advance Directive Documentation        Most Recent Value   Type of Advance Directive  Living will   Pre-existing out of facility DNR order (yellow form or pink MOST form)     "MOST" Form in Place?        TOTAL TIME TAKING CARE OF THIS PATIENT: 40 minutes.    Houston Siren M.D on 10/31/2014 at 11:44 AM  Between 7am to 6pm - Pager - [phone]  After 6pm go to [email or web address] - password [email or web address] [facility]  [facility]  Office  [phone]  CC: Primary care physician; No primary care provider on file.

## 2014-10-31 NOTE — Progress Notes (Signed)
Report called to WOM/ iv and tele removed/ EMS called to transport.

## 2014-10-31 NOTE — Progress Notes (Addendum)
Inpatient Diabetes Program Recommendations  AACE/ADA: New Consensus Statement on Inpatient Glycemic Control (2015)  Target Ranges:  Prepandial:   less than 140 mg/dL      Peak postprandial:   less than 180 mg/dL (1-2 hours)      Critically ill patients:  140 - 180 mg/dL   Review of Glycemic Control  Results for Feliz BeamROXLER, Jordyn (MRN [id number]) as of 10/31/2014 11:01  Ref. Range 10/30/2014 11:27 10/30/2014 19:43 10/30/2014 23:37 10/31/2014 04:28 10/31/2014 07:21  Glucose-Capillary Latest Ref Range: 65-99 mg/dL 409269 (H) 811235 (H) 914155 (H) 254 (H) 107 (H)    Diabetes history: Type 2 Outpatient Diabetes medications: Levemir 46 units qam, 34 units qpm,  Humalog 12 units with breakfast and lunch Current orders for Inpatient glycemic control: Novolog 0-9 units q4h  Inpatient Diabetes Program Recommendations:  Continue Novolog correction q4h, once he's eating, change to tid and hs.  Susette RacerJulie Renaldo Gornick, RN, BA, MHA, CDE Diabetes Coordinator Inpatient Diabetes Program  [phone] (Team Pager) [phone] [facility] Office) 10/31/2014 11:06 AM

## 2014-10-31 NOTE — Progress Notes (Signed)
Patient is medically stable for [facility]. Per Gavin Poundeborah admissions coordinator [facility] 117[facility]B. RN will call report to [facility] and arrange EMS for transport. Clinical Child psychotherapistocial Worker (CSW) prepared D/C packet and sent D/C Summary to [employer] via carefinder. Patient is aware of above. CSW left patient's niece Marny LowensteinUrsula and nephew Reuel BoomDaniel voicemails. Please reconsult if future social work needs arise. CSW signing off.   Jetta LoutBailey Morgan, LCSWA [phone]

## 2014-10-31 NOTE — Care Management (Signed)
There is a discharge order present for today.  Last BM 10/22. Foley has been discontinued. Notified CSW

## 2014-10-31 NOTE — Care Management (Signed)
Patient transferred from icu 10/21.  [facility].  Required emergent hemodialysis due to severe acute renal failure.  Dialysis catheter has been removed.  It is anticipated foley will be removed today for voiding trial.

## 2014-11-01 ENCOUNTER — Ambulatory Visit: Payer: Medicare Other | Admitting: Family

## 2014-11-10 ENCOUNTER — Ambulatory Visit: Payer: Medicare Other | Attending: Family | Admitting: Family

## 2014-11-10 ENCOUNTER — Encounter: Payer: Self-pay | Admitting: Family

## 2014-11-10 VITALS — BP 119/44 | HR 64 | Resp 20 | Ht 71.0 in | Wt 266.0 lb

## 2014-11-10 DIAGNOSIS — Z7982 Long term (current) use of aspirin: Secondary | ICD-10-CM | POA: Insufficient documentation

## 2014-11-10 DIAGNOSIS — I10 Essential (primary) hypertension: Secondary | ICD-10-CM

## 2014-11-10 DIAGNOSIS — E875 Hyperkalemia: Secondary | ICD-10-CM | POA: Insufficient documentation

## 2014-11-10 DIAGNOSIS — Z794 Long term (current) use of insulin: Secondary | ICD-10-CM | POA: Insufficient documentation

## 2014-11-10 DIAGNOSIS — J449 Chronic obstructive pulmonary disease, unspecified: Secondary | ICD-10-CM | POA: Diagnosis not present

## 2014-11-10 DIAGNOSIS — E785 Hyperlipidemia, unspecified: Secondary | ICD-10-CM | POA: Diagnosis not present

## 2014-11-10 DIAGNOSIS — I5032 Chronic diastolic (congestive) heart failure: Secondary | ICD-10-CM | POA: Diagnosis present

## 2014-11-10 DIAGNOSIS — Z79899 Other long term (current) drug therapy: Secondary | ICD-10-CM | POA: Insufficient documentation

## 2014-11-10 DIAGNOSIS — Z880 Allergy status to penicillin: Secondary | ICD-10-CM | POA: Insufficient documentation

## 2014-11-10 DIAGNOSIS — E119 Type 2 diabetes mellitus without complications: Secondary | ICD-10-CM | POA: Insufficient documentation

## 2014-11-10 DIAGNOSIS — K219 Gastro-esophageal reflux disease without esophagitis: Secondary | ICD-10-CM | POA: Insufficient documentation

## 2014-11-10 DIAGNOSIS — E669 Obesity, unspecified: Secondary | ICD-10-CM | POA: Diagnosis not present

## 2014-11-10 DIAGNOSIS — F419 Anxiety disorder, unspecified: Secondary | ICD-10-CM | POA: Insufficient documentation

## 2014-11-10 DIAGNOSIS — I2510 Atherosclerotic heart disease of native coronary artery without angina pectoris: Secondary | ICD-10-CM | POA: Insufficient documentation

## 2014-11-10 DIAGNOSIS — E1122 Type 2 diabetes mellitus with diabetic chronic kidney disease: Secondary | ICD-10-CM

## 2014-11-10 DIAGNOSIS — I129 Hypertensive chronic kidney disease with stage 1 through stage 4 chronic kidney disease, or unspecified chronic kidney disease: Secondary | ICD-10-CM | POA: Insufficient documentation

## 2014-11-10 DIAGNOSIS — N183 Chronic kidney disease, stage 3 (moderate): Secondary | ICD-10-CM | POA: Diagnosis not present

## 2014-11-10 DIAGNOSIS — G4733 Obstructive sleep apnea (adult) (pediatric): Secondary | ICD-10-CM | POA: Insufficient documentation

## 2014-11-10 NOTE — Progress Notes (Signed)
Subjective:    Patient ID: Billy Liu, male    DOB: [birth year], 81 y.o.   MRN: [id number]  Congestive Heart Failure Presents for initial visit. The disease course has been stable. Associated symptoms include edema, fatigue, palpitations and shortness of breath (little bit). Pertinent negatives include no abdominal pain, chest pain, chest pressure or orthopnea. The symptoms have been stable. Past treatments include beta blockers, oxygen and salt and fluid restriction. The treatment provided moderate relief. Compliance with prior treatments has been good. His past medical history is significant for CAD, chronic lung disease, DM and HTN. Compliance with total regimen is 76-100%.  Hypertension This is a chronic problem. The current episode started today. The problem is unchanged. The problem is controlled. Associated symptoms include neck pain (right side of neck sometimes), palpitations, peripheral edema and shortness of breath (little bit). Pertinent negatives include no anxiety, chest pain or headaches. There are no associated agents to hypertension. Risk factors for coronary artery disease include diabetes mellitus, dyslipidemia, male gender and sedentary lifestyle. Past treatments include diuretics, calcium channel blockers, beta blockers and alpha 1 blockers. The current treatment provides significant improvement. Hypertensive end-organ damage includes kidney disease and heart failure.   Past Medical History  Diagnosis Date  . Chronic diastolic CHF (congestive heart failure) (HCC)   . COPD (chronic obstructive pulmonary disease) (HCC)   . Coronary artery disease   . Diabetes mellitus without complication (HCC)   . Hypertension   . Renal disorder   . GERD (gastroesophageal reflux disease)   . Hyperlipemia   . Anxiety   . Obesity   . CKD (chronic kidney disease), stage III     History reviewed. No pertinent past surgical history.  Family History  Problem Relation Age of Onset  .  Cancer Brother     Social History  Substance Use Topics  . Smoking status: Never Smoker   . Smokeless tobacco: Former Neurosurgeon    Types: Chew  . Alcohol Use: No    Allergies  Allergen Reactions  . Penicillins Other (See Comments)    Reaction:  Unknown     Prior to Admission medications   Medication Sig Start Date End Date Taking? Authorizing Provider  acetaminophen (TYLENOL) 500 MG tablet Take 500 mg by mouth 3 (three) times daily.   Yes Historical Provider, MD  amLODipine (NORVASC) 10 MG tablet Take 10 mg by mouth daily.   Yes Historical Provider, MD  aspirin EC 81 MG tablet Take 81 mg by mouth daily.   Yes Historical Provider, MD  cloNIDine (CATAPRES) 0.2 MG tablet Take 1 tablet (0.2 mg total) by mouth 3 (three) times daily. 10/31/14  Yes Houston Siren, MD  divalproex (DEPAKOTE ER) 500 MG 24 hr tablet Take 1,000 mg by mouth at bedtime.   Yes Historical Provider, MD  finasteride (PROSCAR) 5 MG tablet Take 1 tablet (5 mg total) by mouth daily. 10/31/14  Yes Houston Siren, MD  gabapentin (NEURONTIN) 100 MG capsule Take 100 mg by mouth at bedtime.    Yes Historical Provider, MD  glucagon, human recombinant, (GLUCAGEN DIAGNOSTIC) 1 MG injection Inject 1 mg into the muscle once as needed for low blood sugar.   Yes Historical Provider, MD  hydrALAZINE (APRESOLINE) 25 MG tablet Take 3 tablets (75 mg total) by mouth every 6 (six) hours. 10/17/14  Yes Shaune Pollack, MD  hydrochlorothiazide (MICROZIDE) 12.5 MG capsule Take 1 capsule (12.5 mg total) by mouth daily. 10/31/14  Yes Houston Siren, MD  insulin  detemir (LEVEMIR) 100 UNIT/ML injection Inject 24-36 Units into the skin 2 (two) times daily. Pt uses 36 units in the morning and 24 units at bedtime.   Yes Historical Provider, MD  insulin lispro (HUMALOG) 100 UNIT/ML injection Inject 12 Units into the skin 2 (two) times daily with breakfast and lunch. Hold if blood sugar is less than 100.   Yes Historical Provider, MD  metoprolol succinate  (TOPROL-XL) 100 MG 24 hr tablet Take 200 mg by mouth daily.    Yes Historical Provider, MD  senna (SENOKOT) 8.6 MG TABS tablet Take 2 tablets by mouth at bedtime.   Yes Historical Provider, MD  sertraline (ZOLOFT) 25 MG tablet Take 25 mg by mouth daily.   Yes Historical Provider, MD  simvastatin (ZOCOR) 10 MG tablet Take 10 mg by mouth at bedtime. Pt takes with a 20mg  tablet.   Yes Historical Provider, MD  simvastatin (ZOCOR) 20 MG tablet Take 20 mg by mouth at bedtime. Pt takes with a 10mg  tablet.   Yes Historical Provider, MD  torsemide (DEMADEX) 20 MG tablet Take 2 tablets (40 mg total) by mouth daily. Patient taking differently: Take 80 mg by mouth daily.  10/31/14  Yes Houston SirenVivek J Sainani, MD  Vitamin D, Ergocalciferol, (DRISDOL) 50000 UNITS CAPS capsule Take 50,000 Units by mouth every 30 (thirty) days. Pt takes on the 3rd of every month.   Yes Historical Provider, MD      Review of Systems  Constitutional: Positive for fatigue. Negative for appetite change.  HENT: Negative for congestion, postnasal drip and sore throat.   Eyes: Negative.   Respiratory: Positive for shortness of breath (little bit). Negative for cough and chest tightness.   Cardiovascular: Positive for palpitations. Negative for chest pain.  Gastrointestinal: Negative for abdominal pain and abdominal distention.  Endocrine: Negative.   Genitourinary: Negative.   Musculoskeletal: Positive for neck pain (right side of neck sometimes). Negative for back pain.  Skin: Negative.   Allergic/Immunologic: Negative.   Neurological: Negative for dizziness, weakness, light-headedness and headaches.  Hematological: Negative for adenopathy. Does not bruise/bleed easily.  Psychiatric/Behavioral: Negative for sleep disturbance (sleeping with CPAP and oxygen) and dysphoric mood. The patient is nervous/anxious.        Objective:   Physical Exam  Constitutional: He appears well-developed and well-nourished.  HENT:  Head:  Normocephalic and atraumatic.  Eyes: Conjunctivae are normal. Pupils are equal, round, and reactive to light.  Neck: Normal range of motion. Neck supple.  Cardiovascular: Normal rate and regular rhythm.   Pulmonary/Chest: Effort normal. He has no wheezes. He has no rales.  Abdominal: Soft. He exhibits no distension. There is no tenderness.  Musculoskeletal: He exhibits edema (1+ pitting edema bilateral lower legs). He exhibits no tenderness.  Neurological: He is alert. He displays tremor (bilateral hands).  Skin: Skin is warm and dry.  Psychiatric: He has a normal mood and affect. His behavior is normal.  Nursing note and vitals reviewed.   BP 119/44 mmHg  Pulse 64  Resp 20  Ht 5\' 11"  (1.803 m)  Wt 266 lb (120.657 kg)  BMI 37.12 kg/m2  SpO2 97%       Assessment & Plan:  1: Chronic heart failure with preserved ejection fraction- Patient presents with some shortness of breath and fatigue upon exertion. Patient is a difficult historian so unsure of accuracy of statements. Denies currently having any symptoms. Did come to the appointment in a wheelchair and says that he does walk some with a walker at  the facility. Does have edema in both of his lower legs and he's currently wearing compression socks daily. Doesn't elevate them much except when he's in the bed. Is taking HCTZ 12.5mg  daily along with torsemide  daily. Is not taking any potassium as he has a history of hyperkalemia. He does not add salt to his food and the aide that is with him said that the facility doesn't cook with salt. Currently having physical therapy work with him on a daily basis although the aide says that patient is unsteady on his feet. He is getting weighed every other day and the aide said his weight at the facility was 266 pounds. Already has orders placed for the NP at the facility to be informed if he has an overnight weight gain of >3 pounds.  2: HTN- Blood pressure looks good today. Continue medications at  this time. 3: Diabetes- Patient says that he thinks his glucose this morning was in the 200's. Does use insulin. 4: Obstructive sleep apnea- Patient says that he wears oxygen at night along with his CPAP. Reports sleeping well.   Return in 1 month or sooner for any questions/problems before then.

## 2014-11-10 NOTE — Patient Instructions (Signed)
Continue weighing and report an overnight weight gain of >3 pounds or a weekly weight gain of >5 pounds.

## 2014-12-12 ENCOUNTER — Encounter: Payer: Self-pay | Admitting: Family

## 2014-12-12 ENCOUNTER — Ambulatory Visit: Payer: Medicare Other | Attending: Family | Admitting: Family

## 2014-12-12 VITALS — BP 154/72 | HR 69 | Resp 18 | Ht 71.0 in | Wt 250.0 lb

## 2014-12-12 DIAGNOSIS — E119 Type 2 diabetes mellitus without complications: Secondary | ICD-10-CM | POA: Insufficient documentation

## 2014-12-12 DIAGNOSIS — K219 Gastro-esophageal reflux disease without esophagitis: Secondary | ICD-10-CM | POA: Insufficient documentation

## 2014-12-12 DIAGNOSIS — Z880 Allergy status to penicillin: Secondary | ICD-10-CM | POA: Insufficient documentation

## 2014-12-12 DIAGNOSIS — I129 Hypertensive chronic kidney disease with stage 1 through stage 4 chronic kidney disease, or unspecified chronic kidney disease: Secondary | ICD-10-CM | POA: Insufficient documentation

## 2014-12-12 DIAGNOSIS — J410 Simple chronic bronchitis: Secondary | ICD-10-CM

## 2014-12-12 DIAGNOSIS — Z79899 Other long term (current) drug therapy: Secondary | ICD-10-CM | POA: Insufficient documentation

## 2014-12-12 DIAGNOSIS — I2510 Atherosclerotic heart disease of native coronary artery without angina pectoris: Secondary | ICD-10-CM | POA: Insufficient documentation

## 2014-12-12 DIAGNOSIS — F419 Anxiety disorder, unspecified: Secondary | ICD-10-CM | POA: Insufficient documentation

## 2014-12-12 DIAGNOSIS — E785 Hyperlipidemia, unspecified: Secondary | ICD-10-CM | POA: Diagnosis not present

## 2014-12-12 DIAGNOSIS — J449 Chronic obstructive pulmonary disease, unspecified: Secondary | ICD-10-CM | POA: Insufficient documentation

## 2014-12-12 DIAGNOSIS — I5032 Chronic diastolic (congestive) heart failure: Secondary | ICD-10-CM | POA: Diagnosis not present

## 2014-12-12 DIAGNOSIS — Z794 Long term (current) use of insulin: Secondary | ICD-10-CM | POA: Insufficient documentation

## 2014-12-12 DIAGNOSIS — Z7982 Long term (current) use of aspirin: Secondary | ICD-10-CM | POA: Diagnosis not present

## 2014-12-12 DIAGNOSIS — E669 Obesity, unspecified: Secondary | ICD-10-CM | POA: Diagnosis not present

## 2014-12-12 DIAGNOSIS — N183 Chronic kidney disease, stage 3 (moderate): Secondary | ICD-10-CM | POA: Insufficient documentation

## 2014-12-12 DIAGNOSIS — I10 Essential (primary) hypertension: Secondary | ICD-10-CM

## 2014-12-12 NOTE — Patient Instructions (Signed)
Continue weighing daily and call for an overnight weight gain of > 2 pounds or a weekly weight gain of >5 pounds. 

## 2014-12-12 NOTE — Progress Notes (Signed)
Subjective:    Patient ID: Billy Liu, male    DOB: 11/12/1932, 81 y.o.   MRN: [id number]  Congestive Heart Failure Presents for follow-up visit. The disease course has been stable. Associated symptoms include shortness of breath. Pertinent negatives include no abdominal pain, chest pain, edema, fatigue, orthopnea or palpitations. The symptoms have been stable. Past treatments include beta blockers and salt and fluid restriction. The treatment provided moderate relief. Compliance with prior treatments has been good. His past medical history is significant for CAD, chronic lung disease, DM and HTN. Compliance with total regimen is 76-100%.  Hypertension This is a chronic problem. The current episode started more than 1 year ago. The problem is unchanged. Associated symptoms include headaches (sometimes) and shortness of breath. Pertinent negatives include no chest pain, neck pain or palpitations. There are no associated agents to hypertension. Risk factors for coronary artery disease include diabetes mellitus, male gender, obesity and sedentary lifestyle. Past treatments include diuretics, alpha 1 blockers, calcium channel blockers and beta blockers. The current treatment provides moderate improvement. Compliance problems include exercise.  Hypertensive end-organ damage includes CAD/MI and heart failure.   Past Medical History  Diagnosis Date  . Chronic diastolic CHF (congestive heart failure) (HCC)   . COPD (chronic obstructive pulmonary disease) (HCC)   . Coronary artery disease   . Diabetes mellitus without complication (HCC)   . Hypertension   . Renal disorder   . GERD (gastroesophageal reflux disease)   . Hyperlipemia   . Anxiety   . Obesity   . CKD (chronic kidney disease), stage III     History reviewed. No pertinent past surgical history.  Family History  Problem Relation Age of Onset  . Cancer Brother     Social History  Substance Use Topics  . Smoking status: Never  Smoker   . Smokeless tobacco: Former NeurosurgeonUser    Types: Chew  . Alcohol Use: No    Allergies  Allergen Reactions  . Penicillins Other (See Comments)    Reaction:  Unknown     Prior to Admission medications   Medication Sig Start Date End Date Taking? Authorizing Provider  acetaminophen (TYLENOL) 500 MG tablet Take 500 mg by mouth 3 (three) times daily.   Yes Historical Provider, MD  amLODipine (NORVASC) 10 MG tablet Take 10 mg by mouth daily.   Yes Historical Provider, MD  aspirin EC 81 MG tablet Take 81 mg by mouth daily.   Yes Historical Provider, MD  cloNIDine (CATAPRES) 0.2 MG tablet Take 1 tablet (0.2 mg total) by mouth 3 (three) times daily. 10/31/14  Yes Houston SirenVivek J Sainani, MD  divalproex (DEPAKOTE ER) 500 MG 24 hr tablet Take 1,000 mg by mouth at bedtime.   Yes Historical Provider, MD  finasteride (PROSCAR) 5 MG tablet Take 1 tablet (5 mg total) by mouth daily. 10/31/14  Yes Houston SirenVivek J Sainani, MD  gabapentin (NEURONTIN) 100 MG capsule Take 100 mg by mouth at bedtime.    Yes Historical Provider, MD  glucagon, human recombinant, (GLUCAGEN DIAGNOSTIC) 1 MG injection Inject 1 mg into the muscle once as needed for low blood sugar.   Yes Historical Provider, MD  hydrALAZINE (APRESOLINE) 25 MG tablet Take 3 tablets (75 mg total) by mouth every 6 (six) hours. 10/17/14  Yes Shaune PollackQing Chen, MD  hydrochlorothiazide (MICROZIDE) 12.5 MG capsule Take 1 capsule (12.5 mg total) by mouth daily. 10/31/14  Yes Houston SirenVivek J Sainani, MD  insulin detemir (LEVEMIR) 100 UNIT/ML injection Inject 24-36 Units into the skin  2 (two) times daily. Pt uses 36 units in the morning and 24 units at bedtime.   Yes Historical Provider, MD  insulin lispro (HUMALOG) 100 UNIT/ML injection Inject 12 Units into the skin 2 (two) times daily with breakfast and lunch. Hold if blood sugar is less than 100.   Yes Historical Provider, MD  metoprolol succinate (TOPROL-XL) 100 MG 24 hr tablet Take 200 mg by mouth daily.    Yes Historical Provider,  MD  omeprazole (PRILOSEC) 20 MG capsule Take 20 mg by mouth daily.   Yes Historical Provider, MD  senna (SENOKOT) 8.6 MG TABS tablet Take 2 tablets by mouth at bedtime.   Yes Historical Provider, MD  sertraline (ZOLOFT) 25 MG tablet Take 25 mg by mouth daily.   Yes Historical Provider, MD  simvastatin (ZOCOR) 10 MG tablet Take 10 mg by mouth at bedtime. Pt takes with a  tablet.   Yes Historical Provider, MD  simvastatin (ZOCOR) 20 MG tablet Take 20 mg by mouth at bedtime. Pt takes with a  tablet.   Yes Historical Provider, MD  torsemide (DEMADEX) 20 MG tablet Take 2 tablets (40 mg total) by mouth daily. Patient taking differently: Take 80 mg by mouth daily.  10/31/14  Yes Houston Siren, MD  Vitamin D, Ergocalciferol, (DRISDOL) 50000 UNITS CAPS capsule Take 50,000 Units by mouth every 30 (thirty) days. Pt takes on the 3rd of every month.   Yes Historical Provider, MD      Review of Systems  Constitutional: Negative for appetite change and fatigue.  HENT: Negative for congestion, postnasal drip and sore throat.   Eyes: Negative.   Respiratory: Positive for shortness of breath. Negative for cough and chest tightness.   Cardiovascular: Negative for chest pain, palpitations and leg swelling.  Gastrointestinal: Negative for abdominal pain and abdominal distention.  Endocrine: Negative.   Genitourinary: Negative.   Musculoskeletal: Negative for back pain and neck pain.  Skin: Negative.   Allergic/Immunologic: Negative.   Neurological: Positive for headaches (sometimes). Negative for dizziness and light-headedness.  Hematological: Negative for adenopathy. Does not bruise/bleed easily.  Psychiatric/Behavioral: Negative for sleep disturbance and dysphoric mood. The patient is not nervous/anxious.        Objective:   Physical Exam  Constitutional: He appears well-developed and well-nourished.  HENT:  Head: Normocephalic and atraumatic.  Eyes: Conjunctivae are normal. Pupils are  equal, round, and reactive to light.  Neck: Normal range of motion. Neck supple.  Cardiovascular: Normal rate and regular rhythm.   Pulmonary/Chest: Effort normal. He has no wheezes. He has no rales.  Abdominal: Soft. He exhibits no distension. There is no tenderness.  Musculoskeletal: He exhibits no edema or tenderness.  Neurological: He is alert.  Skin: Skin is warm and dry.  Psychiatric: He has a normal mood and affect. His behavior is normal. Thought content normal.  Nursing note and vitals reviewed.   BP 154/72 mmHg  Pulse 69  Resp 18  Ht  (1.803 m)  Wt 250 lb (113.399 kg)  BMI 34.88 kg/m2  SpO2 99%      Assessment & Plan:  1: Chronic heart failure with preserved ejection fraction- Patient presents with shortness of breath upon exertion. Patient is a difficult historian so unsure of accuracy of statements. Does not appear short of breath while sitting in the wheelchair. Patient feels like he is sleeping well at night. He does get weighed at the facility and hasn't had any overnight weight gains. By our scale, he's lost 16  pounds since he was here last month. Feels like he's eating ok and is not adding any salt to his food. He says that he doesn't walk around too much and tends to stay in his wheelchair to get around. No edema in his lower legs and he does have TED hose on and he says that they are removed at bedtime. Has received his flu vaccine this season. 2: HTN- Blood pressure looks good today. Continue medications at this time. 3: COPD- Lungs are currently clear without any wheezing.   Return here in 3 months or sooner for any questions/problems before then.

## 2015-01-12 ENCOUNTER — Ambulatory Visit: Payer: Medicare Other | Admitting: Family

## 2015-03-10 ENCOUNTER — Other Ambulatory Visit
Admit: 2015-03-10 | Discharge: 2015-03-10 | Disposition: A | Discharge status: Home / Self Care | Payer: Medicare Other | Source: Skilled Nursing Facility | Attending: Internal Medicine | Admitting: Internal Medicine

## 2015-03-10 DIAGNOSIS — R799 Abnormal finding of blood chemistry, unspecified: Secondary | ICD-10-CM | POA: Diagnosis present

## 2015-03-11 LAB — URINALYSIS COMPLETE WITH MICROSCOPIC (ARMC ONLY)
Bilirubin Urine: NEGATIVE
Glucose, UA: NEGATIVE mg/dL
Ketones, ur: NEGATIVE mg/dL
Nitrite: NEGATIVE
PH: 7 (ref 5.0–8.0)
PROTEIN: 30 mg/dL — AB
SPECIFIC GRAVITY, URINE: 1.013 (ref 1.005–1.030)

## 2015-03-12 LAB — MICROALBUMIN, URINE: MICROALB UR: 166.9 ug/mL — AB

## 2015-03-13 ENCOUNTER — Ambulatory Visit: Payer: Medicare Other | Admitting: Family

## 2016-09-25 ENCOUNTER — Ambulatory Visit (INDEPENDENT_AMBULATORY_CARE_PROVIDER_SITE_OTHER): Payer: Medicare Other | Admitting: Vascular Surgery

## 2016-09-25 ENCOUNTER — Encounter (INDEPENDENT_AMBULATORY_CARE_PROVIDER_SITE_OTHER): Payer: Self-pay | Admitting: Vascular Surgery

## 2016-09-25 VITALS — BP 150/58 | HR 64 | Resp 16 | Ht 75.0 in | Wt 269.0 lb

## 2016-09-25 DIAGNOSIS — I87311 Chronic venous hypertension (idiopathic) with ulcer of right lower extremity: Secondary | ICD-10-CM

## 2016-09-25 DIAGNOSIS — L97319 Non-pressure chronic ulcer of right ankle with unspecified severity: Secondary | ICD-10-CM | POA: Diagnosis not present

## 2016-09-25 DIAGNOSIS — M7989 Other specified soft tissue disorders: Secondary | ICD-10-CM | POA: Diagnosis not present

## 2016-09-25 DIAGNOSIS — N183 Chronic kidney disease, stage 3 unspecified: Secondary | ICD-10-CM

## 2016-09-25 DIAGNOSIS — Z794 Long term (current) use of insulin: Secondary | ICD-10-CM

## 2016-09-25 DIAGNOSIS — I739 Peripheral vascular disease, unspecified: Secondary | ICD-10-CM

## 2016-09-25 DIAGNOSIS — E1122 Type 2 diabetes mellitus with diabetic chronic kidney disease: Secondary | ICD-10-CM

## 2016-09-25 DIAGNOSIS — L97919 Non-pressure chronic ulcer of unspecified part of right lower leg with unspecified severity: Secondary | ICD-10-CM | POA: Diagnosis not present

## 2016-09-25 NOTE — Progress Notes (Signed)
Subjective:    Patient ID: Billy Liu, male    DOB: 1932-03-21, 83 y.o.   MRN: [id number] Chief Complaint  Patient presents with  . Follow-up    Leg sores, needs new lymph pump   Presents at request of [facility] for bilateral lower extremity edema. Patient seen with aid. Patient last seen in our office approximately 5 years ago. Patient has a long-standing history of bilateral lower extremity edema. He does not wear compression stockings or elevate his legs on a daily basis however he does have a lymphedema pump. States he uses a lymphedema pump on a daily basis. [facility] stating he needs a new pump. States discomfort to the bilateral lower extremity with ambulation. He ambulates minimally. Denies any fever, nausea or vomiting.   Review of Systems  Constitutional: Negative.   HENT: Negative.   Eyes: Negative.   Respiratory: Negative.   Cardiovascular: Positive for leg swelling.  Gastrointestinal: Negative.   Endocrine: Negative.   Genitourinary: Negative.   Musculoskeletal: Negative.   Skin: Negative.   Allergic/Immunologic: Negative.   Neurological: Negative.   Hematological: Negative.   Psychiatric/Behavioral: Negative.       Objective:   Physical Exam  Constitutional: He is oriented to person, place, and time. He appears well-developed and well-nourished. No distress.  HENT:  Head: Normocephalic and atraumatic.  Eyes: Pupils are equal, round, and reactive to light. Conjunctivae are normal.  Neck: Normal range of motion.  Cardiovascular: Normal rate, regular rhythm, normal heart sounds and intact distal pulses.   Pulses:      Radial pulses are 2+ on the right side, and 2+ on the left side.  Unable to palpate pedal pulses due to edema and body habitus.  Pulmonary/Chest: Effort normal.  Musculoskeletal: Normal range of motion. He exhibits edema (Severe bilateral lower stem the edema noted).  Neurological: He is alert and oriented to person, place, and time.    Skin: He is not diaphoretic.     Right lower extremity: 4 cm x 4 cm superficial noninfective ulceration noted to the lateral aspect of the ankle. Cellulitis to the bilateral lower extremity noted.  Psychiatric: He has a normal mood and affect. His behavior is normal. Judgment and thought content normal.  Vitals reviewed.   BP (!) 150/58 (BP Location: Left Arm)   Pulse 64   Resp 16   Ht  (1.905 m)   Wt 269 lb (122 kg)   BMI 33.62 kg/m   Past Medical History:  Diagnosis Date  . Anxiety   . Chronic diastolic CHF (congestive heart failure) (HCC)   . CKD (chronic kidney disease), stage III   . COPD (chronic obstructive pulmonary disease) (HCC)   . Coronary artery disease   . Diabetes mellitus without complication (HCC)   . GERD (gastroesophageal reflux disease)   . Hyperlipemia   . Hypertension   . Obesity   . Renal disorder     Social History   Social History  . Marital status: Married    Spouse name: N/A  . Number of children: N/A  . Years of education: N/A   Occupational History  . Not on file.   Social History Main Topics  . Smoking status: Never Smoker  . Smokeless tobacco: Former Neurosurgeon    Types: Chew  . Alcohol use No  . Drug use: Unknown  . Sexual activity: Not on file   Other Topics Concern  . Not on file   Social History Narrative  .  No narrative on file    No past surgical history on file.  Family History  Problem Relation Age of Onset  . Cancer Brother     Allergies  Allergen Reactions  . Penicillins Other (See Comments)    Reaction:  Unknown        Assessment & Plan:  Presents at request of [facility] for bilateral lower extremity edema. Patient seen with aid. Patient last seen in our office approximately 5 years ago. Patient has a long-standing history of bilateral lower extremity edema. He does not wear compression stockings or elevate his legs on a daily basis however he does have a lymphedema pump. States he uses a  lymphedema pump on a daily basis. [facility] stating he needs a new pump. States discomfort to the bilateral lower extremity with ambulation. He ambulates minimally. Denies any fever, nausea or vomiting.  1. PAD (peripheral artery disease) (HCC) - Stable  Patient has not had an ABI in approximately 5 year Patient with severe edema and ulceration to the right lateral ankle. We will order ABI at 1 month follow-up to rule out any contributing PAD I have discussed with the patient at length the risk factors for and pathogenesis of atherosclerotic disease and encouraged a healthy diet, regular exercise regimen and blood pressure / glucose control.  The patient was encouraged to call the office in the interim if he experiences any claudication like symptoms, rest pain or ulcers to his feet / toes.  VAS [place] ABI WITH/WO TBI; Future  2. Type 2 diabetes mellitus with stage 3 chronic kidney disease, with long-term current use of insulin (HCC) - Stable Encouraged good control as its slows the progression of atherosclerotic disease  3. 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 Patient with superficial noninfected ulceration to the right lateral ankle due to edema. Recommend bilateral Unna wrap changes for 1 month Doxycycline 100 mg, 2 tabs day 1 followed by daily 2 weeks. [place] given number to [employer] to call to inquire about maintenance to the patient's pump. Patient to follow up in 1 month. The patient was instructed to call the office in the interim if any worsening edema or ulcerations to the legs, feet or toes occurs. The patient expresses their understanding.  Current Outpatient Prescriptions on File Prior to Visit  Medication Sig Dispense Refill  . acetaminophen (TYLENOL) 500 MG tablet Take 500 mg by mouth 3 (three) times daily.    [name] amLODipine (NORVASC) 10 MG tablet Take 10 mg by mouth daily.    [name] aspirin EC 81 MG tablet  Take 81 mg by mouth daily.    . cloNIDine (CATAPRES) 0.2 MG tablet Take 1 tablet (0.2 mg total) by mouth 3 (three) times daily. 60 tablet 11  . divalproex (DEPAKOTE ER) 500 MG 24 hr tablet Take 1,000 mg by mouth at bedtime.    . finasteride (PROSCAR) 5 MG tablet Take 1 tablet (5 mg total) by mouth daily.    [name] gabapentin (NEURONTIN) 100 MG capsule Take 100 mg by mouth at bedtime.     [name] glucagon, human recombinant, (GLUCAGEN DIAGNOSTIC) 1 MG injection Inject 1 mg into the muscle once as needed for low blood sugar.    . hydrALAZINE (APRESOLINE) 25 MG tablet Take 3 tablets (75 mg total) by mouth every 6 (six) hours. 90 tablet 0  . hydrochlorothiazide (MICROZIDE) 12.5 MG capsule Take 1 capsule (12.5 mg total) by mouth  daily.    . insulin detemir (LEVEMIR) 100 UNIT/ML injection Inject 24-36 Units into the skin 2 (two) times daily. Pt uses 36 units in the morning and 24 units at bedtime.    . insulin lispro (HUMALOG) 100 UNIT/ML injection Inject 12 Units into the skin 2 (two) times daily with breakfast and lunch. Hold if blood sugar is less than 100.    . metoprolol succinate (TOPROL-XL) 100 MG 24 hr tablet Take 200 mg by mouth daily.     [name] omeprazole (PRILOSEC) 20 MG capsule Take 20 mg by mouth daily.    [name] senna (SENOKOT) 8.6 MG TABS tablet Take 2 tablets by mouth at bedtime.    . sertraline (ZOLOFT) 25 MG tablet Take 25 mg by mouth daily.    . simvastatin (ZOCOR) 20 MG tablet Take 20 mg by mouth at bedtime. Pt takes with a  tablet.    . torsemide (DEMADEX) 20 MG tablet Take 2 tablets (40 mg total) by mouth daily. (Patient taking differently: Take 80 mg by mouth daily. )    . Vitamin D, Ergocalciferol, (DRISDOL) 50000 UNITS CAPS capsule Take 50,000 Units by mouth every 30 (thirty) days. Pt takes on the 3rd of every month.     No current facility-administered medications on file prior to visit.     There are no Patient Instructions on file for this visit. No Follow-up on file.   KIMBERLY A  STEGMAYER, PA-C

## 2016-10-02 ENCOUNTER — Encounter (INDEPENDENT_AMBULATORY_CARE_PROVIDER_SITE_OTHER): Payer: Medicare Other

## 2016-10-09 ENCOUNTER — Encounter (INDEPENDENT_AMBULATORY_CARE_PROVIDER_SITE_OTHER): Payer: Medicare Other

## 2016-10-16 ENCOUNTER — Encounter (INDEPENDENT_AMBULATORY_CARE_PROVIDER_SITE_OTHER): Payer: Medicare Other

## 2016-10-23 ENCOUNTER — Ambulatory Visit (INDEPENDENT_AMBULATORY_CARE_PROVIDER_SITE_OTHER): Payer: Medicare Other | Admitting: Vascular Surgery

## 2016-10-23 ENCOUNTER — Encounter (INDEPENDENT_AMBULATORY_CARE_PROVIDER_SITE_OTHER): Payer: Self-pay | Admitting: Vascular Surgery

## 2016-10-23 ENCOUNTER — Ambulatory Visit (INDEPENDENT_AMBULATORY_CARE_PROVIDER_SITE_OTHER): Payer: Medicare Other

## 2016-10-23 VITALS — BP 170/60 | HR 66 | Resp 16 | Ht 71.0 in | Wt 268.9 lb

## 2016-10-23 DIAGNOSIS — E785 Hyperlipidemia, unspecified: Secondary | ICD-10-CM | POA: Diagnosis not present

## 2016-10-23 DIAGNOSIS — E1122 Type 2 diabetes mellitus with diabetic chronic kidney disease: Secondary | ICD-10-CM

## 2016-10-23 DIAGNOSIS — Z794 Long term (current) use of insulin: Secondary | ICD-10-CM

## 2016-10-23 DIAGNOSIS — I739 Peripheral vascular disease, unspecified: Secondary | ICD-10-CM

## 2016-10-23 DIAGNOSIS — I890 Lymphedema, not elsewhere classified: Secondary | ICD-10-CM | POA: Diagnosis not present

## 2016-10-23 DIAGNOSIS — N183 Chronic kidney disease, stage 3 unspecified: Secondary | ICD-10-CM

## 2016-10-23 NOTE — Progress Notes (Signed)
Subjective:    Patient ID: Billy Liu, male    DOB: 11/29/1932, 83 y.o.   MRN: [id number] Chief Complaint  Patient presents with  . Follow-up    24mo abi,unna check   Patient presents for a 1 month lymphedema exacerbation with ulceration follow-up. He has been undergoing bilateral Unna wrap therapy.He presents today with an aide from his residence. He presents today without complaint. He denies any pain to his lower extremity. He is not very ambulatory. States improvement in the "swelling" of his lower extremity. Patient states he is elevating his legs as much as possible. Patient also states that he is using his lymphedema pump at least once a day. Denies any fever, nausea or vomiting. The patient underwent a bilateral ABI which was notable for inability to obtain reliable bilateral ankle brachial indices due to a lack of correlation between ankle pressures and Doppler waveforms which is most likely a result of medial calcification. Monophasic bilateral Doppler waveforms and mildly abnormal bilateral toe brachial indices are suggestive of decreased arterial perfusion to the bilateral lower extremities.    Review of Systems  Constitutional: Negative.   HENT: Negative.   Eyes: Negative.   Respiratory: Negative.   Cardiovascular: Positive for leg swelling.  Gastrointestinal: Negative.   Endocrine: Negative.   Genitourinary: Negative.   Musculoskeletal: Negative.   Skin: Negative.   Allergic/Immunologic: Negative.   Neurological: Negative.   Hematological: Negative.   Psychiatric/Behavioral: Negative.       Objective:   Physical Exam  Constitutional: He is oriented to person, place, and time. He appears well-developed and well-nourished. No distress.  HENT:  Head: Normocephalic and atraumatic.  Eyes: Pupils are equal, round, and reactive to light. Conjunctivae are normal.  Neck: Normal range of motion.  Cardiovascular: Normal rate, regular rhythm, normal heart sounds and intact  distal pulses.   Pulses:      Radial pulses are 2+ on the right side, and 2+ on the left side.  Unable to palpate pedal pulses  Pulmonary/Chest: Effort normal.  Musculoskeletal: Normal range of motion. He exhibits edema (mild edema noted to the bilateral lower extremity).  Neurological: He is alert and oriented to person, place, and time.  Skin: He is not diaphoretic.  Right lower extremity: back of shin ulceration still present however it is completely full of granulation tissue. Noninfected. No cellulitis. No purulent drainage.   Psychiatric: He has a normal mood and affect. His behavior is normal. Judgment and thought content normal.  Vitals reviewed.  BP (!) 170/60 (BP Location: Left Arm)   Pulse 66   Resp 16   Ht 5\' 11"  (1.803 m)   Wt 268 lb 14.4 oz (122 kg)   BMI 37.50 kg/m   Past Medical History:  Diagnosis Date  . Anxiety   . Chronic diastolic CHF (congestive heart failure) (HCC)   . CKD (chronic kidney disease), stage III (HCC)   . COPD (chronic obstructive pulmonary disease) (HCC)   . Coronary artery disease   . Diabetes mellitus without complication (HCC)   . GERD (gastroesophageal reflux disease)   . Hyperlipemia   . Hypertension   . Obesity   . Renal disorder    Social History   Social History  . Marital status: Married    Spouse name: N/A  . Number of children: N/A  . Years of education: N/A   Occupational History  . Not on file.   Social History Main Topics  . Smoking status: Never Smoker  . Smokeless  tobacco: Former Neurosurgeon    Types: Chew  . Alcohol use No  . Drug use: Unknown  . Sexual activity: Not on file   Other Topics Concern  . Not on file   Social History Narrative  . No narrative on file   No past surgical history on file.  Family History  Problem Relation Age of Onset  . Cancer Brother    Allergies  Allergen Reactions  . Penicillins Other (See Comments)    Reaction:  Unknown       Assessment & Plan:  Patient presents for  a 1 month lymphedema exacerbation with ulceration follow-up. He has been undergoing bilateral Unna wrap therapy.He presents today with an aide from his residence. He presents today without complaint. He denies any pain to his lower extremity. He is not very ambulatory. States improvement in the "swelling" of his lower extremity. Patient states he is elevating his legs as much as possible. Patient also states that he is using his lymphedema pump at least once a day. Denies any fever, nausea or vomiting. The patient underwent a bilateral ABI which was notable for inability to obtain reliable bilateral ankle brachial indices due to a lack of correlation between ankle pressures and Doppler waveforms which is most likely a result of medial calcification. Monophasic bilateral Doppler waveforms and mildly abnormal bilateral toe brachial indices are suggestive of decreased arterial perfusion to the bilateral lower extremities.   1. Lymphedema - Stable There has been an improvement in the bilateral lower extremity edema on presentation today Right calf ulcer has not completely healed. However it is healing well with granulation tissue. Recommend continuing Unna wraps for another month's worth of therapy. Patient to continue elevating his legs and using his lymphedema pump at least twice a day for an hour each time. This was written down in the nursing home progress note.  2. Type 2 diabetes mellitus with stage 3 chronic kidney disease, with long-term current use of insulin (HCC) - Stable Encouraged good control as its slows the progression of atherosclerotic disease  3. Hyperlipidemia, unspecified hyperlipidemia type - stable Encouraged good control as its slows the progression of atherosclerotic disease  Current Outpatient Prescriptions on File Prior to Visit  Medication Sig Dispense Refill  . acetaminophen (TYLENOL) 500 MG tablet Take 500 mg by mouth 3 (three) times daily.    [name] amLODipine (NORVASC) 10 MG  tablet Take 10 mg by mouth daily.    [name] aspirin EC 81 MG tablet Take 81 mg by mouth daily.    . cloNIDine (CATAPRES) 0.2 MG tablet Take 1 tablet (0.2 mg total) by mouth 3 (three) times daily. 60 tablet 11  . divalproex (DEPAKOTE ER) 500 MG 24 hr tablet Take 1,000 mg by mouth at bedtime.    . divalproex (DEPAKOTE) 250 MG DR tablet Take 250 mg by mouth every morning.    . finasteride (PROSCAR) 5 MG tablet Take 1 tablet (5 mg total) by mouth daily.    [name] gabapentin (NEURONTIN) 100 MG capsule Take 100 mg by mouth at bedtime.     . Glucagon HCl, rDNA, (GLUCAGEN HYPOKIT IJ) Inject as directed.    [name] glucagon, human recombinant, (GLUCAGEN DIAGNOSTIC) 1 MG injection Inject 1 mg into the muscle once as needed for low blood sugar.    . hydrALAZINE (APRESOLINE) 25 MG tablet Take 3 tablets (75 mg total) by mouth every 6 (six) hours. 90 tablet 0  . hydrochlorothiazide (MICROZIDE) 12.5 MG capsule Take 1 capsule (12.5 mg total)  by mouth daily.    . insulin detemir (LEVEMIR) 100 UNIT/ML injection Inject 24-36 Units into the skin 2 (two) times daily. Pt uses 36 units in the morning and 24 units at bedtime.    . insulin lispro (HUMALOG) 100 UNIT/ML injection Inject 12 Units into the skin 2 (two) times daily with breakfast and lunch. Hold if blood sugar is less than 100.    . metoprolol succinate (TOPROL-XL) 100 MG 24 hr tablet Take 200 mg by mouth daily.     [name] omeprazole (PRILOSEC) 20 MG capsule Take 20 mg by mouth daily.    [name] senna (SENOKOT) 8.6 MG TABS tablet Take 2 tablets by mouth at bedtime.    . sertraline (ZOLOFT) 25 MG tablet Take 25 mg by mouth daily.    . sertraline (ZOLOFT) 50 MG tablet     . simvastatin (ZOCOR) 20 MG tablet Take 20 mg by mouth at bedtime. Pt takes with a 10mg  tablet.    . torsemide (DEMADEX) 20 MG tablet Take 2 tablets (40 mg total) by mouth daily. (Patient taking differently: Take 80 mg by mouth daily. )    . Vitamin D, Ergocalciferol, (DRISDOL) 50000 UNITS CAPS capsule Take 50,000 Units  by mouth every 30 (thirty) days. Pt takes on the 3rd of every month.     No current facility-administered medications on file prior to visit.     There are no Patient Instructions on file for this visit. No Follow-up on file.   Tolbert Matheson A Sheilia Reznick, PA-C

## 2016-11-25 ENCOUNTER — Ambulatory Visit (INDEPENDENT_AMBULATORY_CARE_PROVIDER_SITE_OTHER): Payer: Medicare Other | Admitting: Vascular Surgery

## 2016-11-25 ENCOUNTER — Encounter (INDEPENDENT_AMBULATORY_CARE_PROVIDER_SITE_OTHER): Payer: Self-pay | Admitting: Vascular Surgery

## 2016-11-25 VITALS — BP 163/58 | HR 63 | Resp 16 | Wt 264.0 lb

## 2016-11-25 DIAGNOSIS — Z794 Long term (current) use of insulin: Secondary | ICD-10-CM

## 2016-11-25 DIAGNOSIS — N183 Chronic kidney disease, stage 3 unspecified: Secondary | ICD-10-CM

## 2016-11-25 DIAGNOSIS — I890 Lymphedema, not elsewhere classified: Secondary | ICD-10-CM | POA: Diagnosis not present

## 2016-11-25 DIAGNOSIS — E1122 Type 2 diabetes mellitus with diabetic chronic kidney disease: Secondary | ICD-10-CM

## 2016-11-25 NOTE — Progress Notes (Signed)
Subjective:    Patient ID: Billy Liu, male    DOB: 05/31/1932, 83 y.o.   MRN: [id number] Chief Complaint  Patient presents with  . Follow-up    unna check   Presents for a monthly lymphedema/Unna boot follow-up.  Patient presents with nursing aide.  Presents without complaint.  Patient states he continues to use his lymphedema pump on a daily basis.  The patient is currently receiving boot changes to the bilateral lower extremity from his nursing home residence.  Patient presents today without complaint.  Patient denies any fever, nausea or vomiting.   Review of Systems  Constitutional: Negative.   HENT: Negative.   Eyes: Negative.   Respiratory: Negative.   Cardiovascular: Positive for leg swelling.  Gastrointestinal: Negative.   Endocrine: Negative.   Genitourinary: Negative.   Musculoskeletal: Negative.   Skin: Positive for wound.  Allergic/Immunologic: Negative.   Neurological: Negative.   Hematological: Negative.   Psychiatric/Behavioral: Negative.       Objective:   Physical Exam  Constitutional: He is oriented to person, place, and time. He appears well-developed and well-nourished. No distress.  HENT:  Head: Normocephalic and atraumatic.  Eyes: Conjunctivae are normal. Pupils are equal, round, and reactive to light.  Neck: Normal range of motion.  Cardiovascular: Normal rate, regular rhythm, normal heart sounds and intact distal pulses.  Pulses:      Radial pulses are 2+ on the right side, and 2+ on the left side.  Hard to palpate pedal pulses  Pulmonary/Chest: Effort normal and breath sounds normal.  Musculoskeletal: Normal range of motion. He exhibits edema (Mild bilateral lower extremity edema noted.).  Neurological: He is alert and oriented to person, place, and time.  Skin: He is not diaphoretic.  Right: 2cm x 2cm shallow ulceration with wound bed of granulation tissue noted to the back of the calf. Left: Shallow small ulcerations to the left second  toe  Vitals reviewed.  BP (!) 163/58 (BP Location: Left Arm)   Pulse 63   Resp 16   Wt 264 lb (119.7 kg)   BMI 36.82 kg/m   Past Medical History:  Diagnosis Date  . Anxiety   . Chronic diastolic CHF (congestive heart failure) (HCC)   . CKD (chronic kidney disease), stage III (HCC)   . COPD (chronic obstructive pulmonary disease) (HCC)   . Coronary artery disease   . Diabetes mellitus without complication (HCC)   . GERD (gastroesophageal reflux disease)   . Hyperlipemia   . Hypertension   . Obesity   . Renal disorder    Social History   Socioeconomic History  . Marital status: Married    Spouse name: Not on file  . Number of children: Not on file  . Years of education: Not on file  . Highest education level: Not on file  Social Needs  . Financial resource strain: Not on file  . Food insecurity - worry: Not on file  . Food insecurity - inability: Not on file  . Transportation needs - medical: Not on file  . Transportation needs - non-medical: Not on file  Occupational History  . Not on file  Tobacco Use  . Smoking status: Never Smoker  . Smokeless tobacco: Former NeurosurgeonUser    Types: Chew  Substance and Sexual Activity  . Alcohol use: No  . Drug use: Not on file  . Sexual activity: Not on file  Other Topics Concern  . Not on file  Social History Narrative  . Not on file  No past surgical history on file.  Family History  Problem Relation Age of Onset  . Cancer Brother    Allergies  Allergen Reactions  . Penicillins Other (See Comments)    Reaction:  Unknown       Assessment & Plan:  Presents for a monthly lymphedema/Unna boot follow-up.  Patient presents with nursing aide.  Presents without complaint.  Patient states he continues to use his lymphedema pump on a daily basis.  The patient is currently receiving boot changes to the bilateral lower extremity from his nursing home residence.  Patient presents today without complaint.  Patient denies any fever,  nausea or vomiting.  1. Lymphedema - Stable Patient continues to present with improving bilateral lower extremity edema. The ulceration to the back of the right shin continues to heal however it is not completely healed. Recommend continuing his bilateral lower extremity wraps until his ulceration is healed The patient is to continue using his lymphedema pump at least twice a day for an hour each time The patient was encouraged to elevate his legs heart level or higher to improve the swelling he experiences to his toes. This was again written down in a communication note to his nursing home. Patient to follow-up in 1 month  2. CKD (chronic kidney disease), stage III (HCC) - Stable Contributing factor to the patient's bilateral lower extremity edema  3. Type 2 diabetes mellitus with stage 3 chronic kidney disease, with long-term current use of insulin (HCC) - Stable Encouraged good control as its slows the progression of atherosclerotic disease  Current Outpatient Medications on File Prior to Visit  Medication Sig Dispense Refill  . acetaminophen (TYLENOL) 500 MG tablet Take 500 mg by mouth 3 (three) times daily.    [name]. amLODipine (NORVASC) 10 MG tablet Take 10 mg by mouth daily.    [name]. aspirin EC 81 MG tablet Take 81 mg by mouth daily.    . cloNIDine (CATAPRES) 0.2 MG tablet Take 1 tablet (0.2 mg total) by mouth 3 (three) times daily. 60 tablet 11  . clotrimazole (LOTRIMIN) 1 % cream Apply 1 application topically daily.    . divalproex (DEPAKOTE ER) 500 MG 24 hr tablet Take 1,000 mg by mouth at bedtime.    . divalproex (DEPAKOTE) 250 MG DR tablet Take 250 mg by mouth every morning.    . finasteride (PROSCAR) 5 MG tablet Take 1 tablet (5 mg total) by mouth daily.    [name]. gabapentin (NEURONTIN) 100 MG capsule Take 100 mg by mouth at bedtime.     . Glucagon HCl, rDNA, (GLUCAGEN HYPOKIT IJ) Inject as directed.    [name]. glucagon, human recombinant, (GLUCAGEN DIAGNOSTIC) 1 MG injection Inject 1 mg into  the muscle once as needed for low blood sugar.    . hydrALAZINE (APRESOLINE) 25 MG tablet Take 3 tablets (75 mg total) by mouth every 6 (six) hours. 90 tablet 0  . hydrochlorothiazide (MICROZIDE) 12.5 MG capsule Take 1 capsule (12.5 mg total) by mouth daily.    . insulin detemir (LEVEMIR) 100 UNIT/ML injection Inject 24-36 Units into the skin 2 (two) times daily. Pt uses 36 units in the morning and 24 units at bedtime.    . insulin lispro (HUMALOG) 100 UNIT/ML injection Inject 12 Units into the skin 2 (two) times daily with breakfast and lunch. Hold if blood sugar is less than 100.    . metoprolol succinate (TOPROL-XL) 100 MG 24 hr tablet Take 200 mg by mouth daily.     .[name]  omeprazole (PRILOSEC) 20 MG capsule Take 20 mg by mouth daily.    [name] senna (SENOKOT) 8.6 MG TABS tablet Take 2 tablets by mouth at bedtime.    . sertraline (ZOLOFT) 25 MG tablet Take 25 mg by mouth daily.    . sertraline (ZOLOFT) 50 MG tablet     . simvastatin (ZOCOR) 20 MG tablet Take 20 mg by mouth at bedtime. Pt takes with a 10mg  tablet.    . torsemide (DEMADEX) 20 MG tablet Take 2 tablets (40 mg total) by mouth daily. (Patient taking differently: Take 80 mg by mouth daily. )    . Vitamin D, Ergocalciferol, (DRISDOL) 50000 UNITS CAPS capsule Take 50,000 Units by mouth every 30 (thirty) days. Pt takes on the 3rd of every month.     No current facility-administered medications on file prior to visit.    There are no Patient Instructions on file for this visit. No Follow-up on file.  Masaye Gatchalian A Alvaro Aungst, PA-C

## 2016-12-02 ENCOUNTER — Encounter (INDEPENDENT_AMBULATORY_CARE_PROVIDER_SITE_OTHER): Payer: Medicare Other

## 2016-12-09 ENCOUNTER — Encounter (INDEPENDENT_AMBULATORY_CARE_PROVIDER_SITE_OTHER): Payer: Medicare Other

## 2016-12-16 ENCOUNTER — Encounter (INDEPENDENT_AMBULATORY_CARE_PROVIDER_SITE_OTHER): Payer: Medicare Other

## 2016-12-23 ENCOUNTER — Encounter (INDEPENDENT_AMBULATORY_CARE_PROVIDER_SITE_OTHER): Payer: Self-pay | Admitting: Vascular Surgery

## 2016-12-23 ENCOUNTER — Encounter (INDEPENDENT_AMBULATORY_CARE_PROVIDER_SITE_OTHER): Payer: Self-pay

## 2016-12-23 ENCOUNTER — Ambulatory Visit (INDEPENDENT_AMBULATORY_CARE_PROVIDER_SITE_OTHER): Payer: Medicare Other | Admitting: Vascular Surgery

## 2016-12-23 VITALS — BP 190/66 | HR 62 | Resp 18 | Ht 73.0 in

## 2016-12-23 DIAGNOSIS — E1122 Type 2 diabetes mellitus with diabetic chronic kidney disease: Secondary | ICD-10-CM | POA: Diagnosis not present

## 2016-12-23 DIAGNOSIS — Z794 Long term (current) use of insulin: Secondary | ICD-10-CM

## 2016-12-23 DIAGNOSIS — J410 Simple chronic bronchitis: Secondary | ICD-10-CM

## 2016-12-23 DIAGNOSIS — I25119 Atherosclerotic heart disease of native coronary artery with unspecified angina pectoris: Secondary | ICD-10-CM

## 2016-12-23 DIAGNOSIS — I7025 Atherosclerosis of native arteries of other extremities with ulceration: Secondary | ICD-10-CM

## 2016-12-23 DIAGNOSIS — I872 Venous insufficiency (chronic) (peripheral): Secondary | ICD-10-CM | POA: Diagnosis not present

## 2016-12-23 DIAGNOSIS — N183 Chronic kidney disease, stage 3 (moderate): Secondary | ICD-10-CM | POA: Diagnosis not present

## 2016-12-23 DIAGNOSIS — I890 Lymphedema, not elsewhere classified: Secondary | ICD-10-CM | POA: Diagnosis not present

## 2016-12-25 ENCOUNTER — Encounter (INDEPENDENT_AMBULATORY_CARE_PROVIDER_SITE_OTHER): Payer: Self-pay | Admitting: Vascular Surgery

## 2016-12-25 DIAGNOSIS — I70219 Atherosclerosis of native arteries of extremities with intermittent claudication, unspecified extremity: Secondary | ICD-10-CM | POA: Insufficient documentation

## 2016-12-25 DIAGNOSIS — I872 Venous insufficiency (chronic) (peripheral): Secondary | ICD-10-CM | POA: Insufficient documentation

## 2016-12-25 NOTE — Progress Notes (Signed)
MRN : [id number]  Billy Liu is a 83 y.o. (03/23/1932) male who presents with chief complaint of  Chief Complaint  Patient presents with  . Follow-up    Unna boot check  .  History of Present Illness: Patient is seen for follow up evaluation of leg pain and swelling associated with venous ulceration. The patient was recently seen here and started on Unna boot therapy.  The swelling abruptly became much worse bilaterally and is associated with pain and discoloration. The pain and swelling worsens with prolonged dependency and improves with elevation.  The patient notes that in the morning the legs are better but the leg symptoms worsened throughout the course of the day. The patient has also noted a progressive worsening of the discoloration in the ankle and shin area.   The patient notes that an ulcer has developed acutely without specific trauma and since it occurred it has been very slow to heal.  There is a moderate amount of drainage associated with the open area.  The wound is also very painful.  The patient notes that they were not able to tolerate the Unna boot and removed it several days ago.  The patient states that they have been elevating as much as possible. The patient denies any recent changes in medications.  The patient denies a history of DVT or PE. There is no prior history of phlebitis. There is no history of primary lymphedema.  No SOB or increased cough.  No sputum production.  No recent episodes of CHF exacerbation.   No outpatient medications have been marked as taking for the 12/23/16 encounter (Clinical Support) with [name], [name], MD.    Past Medical History:  Diagnosis Date  . Anxiety   . Chronic diastolic CHF (congestive heart failure) (HCC)   . CKD (chronic kidney disease), stage III (HCC)   . COPD (chronic obstructive pulmonary disease) (HCC)   . Coronary artery disease   . Diabetes mellitus without complication (HCC)   . GERD  (gastroesophageal reflux disease)   . Hyperlipemia   . Hypertension   . Obesity   . Renal disorder     History reviewed. No pertinent surgical history.  Social History Social History   Tobacco Use  . Smoking status: Never Smoker  . Smokeless tobacco: Former NeurosurgeonUser    Types: Chew  Substance Use Topics  . Alcohol use: No  . Drug use: Not on file    Family History Family History  Problem Relation Age of Onset  . Cancer Brother     Allergies  Allergen Reactions  . Penicillins Other (See Comments)    Per MAR      REVIEW OF SYSTEMS (Negative unless checked)  Constitutional: [] Weight loss  [] Fever  [] Chills Cardiac: [] Chest pain   [] Chest pressure   [] Palpitations   [] Shortness of breath when laying flat   [] Shortness of breath with exertion. Vascular:  [] Pain in legs with walking   [x] Pain in legs at rest  [] History of DVT   [] Phlebitis   [x] Swelling in legs   [x] Varicose veins   [x] Non-healing ulcers Pulmonary:   [] Uses home oxygen   [] Productive cough   [] Hemoptysis   [] Wheeze  [] COPD   [] Asthma Neurologic:  [] Dizziness   [] Seizures   [] History of stroke   [] History of TIA  [] Aphasia   [] Vissual changes   [] Weakness or numbness in arm   [] Weakness or numbness in leg Musculoskeletal:   [] Joint swelling   [] Joint pain   []   Low back pain Hematologic:  [] Easy bruising  [] Easy bleeding   [] Hypercoagulable state   [] Anemic Gastrointestinal:  [] Diarrhea   [] Vomiting  [] Gastroesophageal reflux/heartburn   [] Difficulty swallowing. Genitourinary:  [] Chronic kidney disease   [] Difficult urination  [] Frequent urination   [] Blood in urine Skin:  [x] Rashes   [x] Ulcers  Psychological:  [] History of anxiety   []  History of major depression.  Physical Examination  Vitals:   12/23/16 0938  BP: (!) 190/66  Pulse: 62  Resp: 18  Height: 6\' 1"  (1.854 m)   Body mass index is 34.83 kg/m. Gen: WD/WN, NAD Head: [place]/AT, No temporalis wasting.  Ear/Nose/Throat: Hearing grossly intact, nares  w/o erythema or drainage Eyes: PER, EOMI, sclera nonicteric.  Neck: Supple, no large masses.   Pulmonary:  Good air movement, no audible wheezing bilaterally, no use of accessory muscles.  Cardiac: RRR, no JVD Vascular: 2-3+ edema bilaterally with severe venous changes bilaterally.  Venous ulcers noted in the ankle area bilaterally, noninfected  Vessel Right Left  Radial Palpable Palpable  PT Not Palpable Not Palpable  DP Not Palpable Not Palpable  Gastrointestinal: Non-distended. No guarding/no peritoneal signs.  Musculoskeletal: M/S 5/5 throughout.  No deformity or atrophy.  Neurologic: CN 2-12 intact. Symmetrical.  Speech is fluent. Motor exam as listed above. Psychiatric: Judgment intact, Mood & affect appropriate for pt's clinical situation. Dermatologic: Venous stasis dermatitis with ulcers present.  No changes consistent with cellulitis. Lymph : No lichenification or skin changes of chronic lymphedema.  CBC Lab Results  Component Value Date   WBC 5.6 10/27/2014   HGB 9.2 (L) 10/27/2014   HCT 29.1 (L) 10/27/2014   MCV 88.9 10/27/2014   PLT 91 (L) 10/27/2014    BMET    Component Value Date/Time   NA 137 10/31/2014 0939   NA 133 (L) 07/04/2013 1030   K 4.8 10/31/2014 0939   K 4.3 07/04/2013 1030   CL 102 10/31/2014 0939   CL 98 07/04/2013 1030   CO2 29 10/31/2014 0939   CO2 29 07/04/2013 1030   GLUCOSE 139 (H) 10/31/2014 0939   GLUCOSE 83 07/04/2013 1030   BUN 34 (H) 10/31/2014 0939   BUN 21 (H) 07/04/2013 1030   CREATININE 1.30 (H) 10/31/2014 0939   CREATININE 1.81 (H) 07/04/2013 1030   CALCIUM 8.9 10/31/2014 0939   CALCIUM 8.7 07/04/2013 1030   GFRNONAA 50 (L) 10/31/2014 0939   GFRNONAA 35 (L) 07/04/2013 1030   GFRAA 58 (L) 10/31/2014 0939   GFRAA 40 (L) 07/04/2013 1030   CrCl cannot be calculated (Patient's most recent lab result is older than the maximum 21 days allowed.).  COAG No results found for: INR, PROTIME  Radiology No results  found.  Assessment/Plan 1. Atherosclerosis of native arteries of the extremities with ulceration (HCC)  Recommend:  The patient has evidence of severe atherosclerotic changes of both lower extremities associated with ulceration and tissue loss of the left foot.  This represents a limb threatening ischemia and places the patient at the risk for limb loss.  Patient should undergo angiography of the left lower extremity with the hope for intervention for limb salvage.  The risks and benefits as well as the alternative therapies was discussed in detail with the patient.  All questions were answered.  Patient agrees to proceed with angiography.  The patient will follow up with me in the office after the procedure.    2. Chronic venous insufficiency No surgery or intervention at this point in time.  I have had a long discussion with the patient regarding venous insufficiency and why it  causes symptoms. I have discussed with the patient the chronic skin changes that accompany venous insufficiency and the long term sequela such as infection and ulceration.  Patient will begin wearing graduated compression stockings class 1 (20-30 mmHg) or compression wraps on a daily basis a prescription was given. The patient will put the stockings on first thing in the morning and removing them in the evening. The patient is instructed specifically not to sleep in the stockings.    In addition, behavioral modification including several periods of elevation of the lower extremities during the day will be continued. I have demonstrated that proper elevation is a position with the ankles at heart level.  The patient is instructed to begin routine exercise, especially walking on a daily basis  Following the review of the ultrasound the patient will follow up in 2-3 months to reassess the degree of swelling and the control that graduated compression stockings or compression wraps  is offering.   The patient can be  assessed for a Lymph Pump at that time  3. Lymphedema No surgery or intervention at this point in time.    I have had a long discussion with the patient regarding venous insufficiency and why it  causes symptoms. I have discussed with the patient the chronic skin changes that accompany venous insufficiency and the long term sequela such as infection and ulceration.  Patient will begin wearing graduated compression stockings class 1 (20-30 mmHg) or compression wraps on a daily basis a prescription was given. The patient will put the stockings on first thing in the morning and removing them in the evening. The patient is instructed specifically not to sleep in the stockings.    In addition, behavioral modification including several periods of elevation of the lower extremities during the day will be continued. I have demonstrated that proper elevation is a position with the ankles at heart level.  The patient is instructed to begin routine exercise, especially walking on a daily basis  Following the review of the ultrasound the patient will follow up in 2-3 months to reassess the degree of swelling and the control that graduated compression stockings or compression wraps  is offering.   The patient can be assessed for a Lymph Pump at that time  4. Coronary artery disease involving native coronary artery of native heart with angina pectoris (HCC) Continue cardiac and antihypertensive medications as already ordered and reviewed, no changes at this time.  Continue statin as ordered and reviewed, no changes at this time  Nitrates PRN for chest pain   5. Simple chronic bronchitis (HCC) Continue pulmonary medications and aerosols as already ordered, these medications have been reviewed and there are no changes at this time.    6. Type 2 diabetes mellitus with stage 3 chronic kidney disease, with long-term current use of insulin (HCC) Continue hypoglycemic medications as already ordered, these  medications have been reviewed and there are no changes at this time.  Hgb A1C to be monitored as already arranged by primary service     Levora DredgeGregory Schnier, MD  12/25/2016 9:11 PM

## 2017-01-01 ENCOUNTER — Other Ambulatory Visit (INDEPENDENT_AMBULATORY_CARE_PROVIDER_SITE_OTHER): Payer: Self-pay | Admitting: Vascular Surgery

## 2017-01-06 ENCOUNTER — Encounter
Admission: RE | Admit: 2017-01-06 | Discharge: 2017-01-06 | Disposition: A | Discharge status: Home / Self Care | Payer: Medicare Other | Source: Ambulatory Visit | Attending: Vascular Surgery | Admitting: Vascular Surgery

## 2017-01-06 DIAGNOSIS — I739 Peripheral vascular disease, unspecified: Secondary | ICD-10-CM | POA: Diagnosis not present

## 2017-01-06 HISTORY — DX: Depression, unspecified: F32.A

## 2017-01-06 HISTORY — DX: Cellulitis, unspecified: L03.90

## 2017-01-06 HISTORY — DX: Unspecified dementia, unspecified severity, without behavioral disturbance, psychotic disturbance, mood disturbance, and anxiety: F03.90

## 2017-01-06 HISTORY — DX: Major depressive disorder, single episode, unspecified: F32.9

## 2017-01-06 LAB — BUN: BUN: 35 mg/dL — AB (ref 6–20)

## 2017-01-06 LAB — CREATININE, SERUM
Creatinine, Ser: 1.2 mg/dL (ref 0.61–1.24)
GFR, EST NON AFRICAN AMERICAN: 54 mL/min — AB (ref >60)

## 2017-01-07 MED ORDER — CLINDAMYCIN PHOSPHATE 300 MG/50ML IV SOLN: 300.0000 mg | Freq: Once | INTRAVENOUS | Status: DC

## 2017-01-08 ENCOUNTER — Encounter: Payer: Self-pay | Admitting: *Deleted

## 2017-01-08 ENCOUNTER — Ambulatory Visit
Admission: RE | Admit: 2017-01-08 | Discharge: 2017-01-08 | Disposition: A | Discharge status: Skilled Nursing Facility | Payer: Medicare Other | Source: Ambulatory Visit | Attending: Vascular Surgery | Admitting: Vascular Surgery

## 2017-01-08 DIAGNOSIS — I739 Peripheral vascular disease, unspecified: Secondary | ICD-10-CM | POA: Diagnosis not present

## 2017-01-08 LAB — GLUCOSE, CAPILLARY
GLUCOSE-CAPILLARY: 45 mg/dL — AB (ref 65–99)
Glucose-Capillary: 97 mg/dL (ref 65–99)
Glucose-Capillary: 112 mg/dL — ABNORMAL HIGH (ref 65–99)

## 2017-01-08 MED ORDER — SODIUM CHLORIDE 0.9 % IV SOLN
INTRAVENOUS | Status: DC
  Administered 2017-01-08: 10:00:00 via INTRAVENOUS

## 2017-01-08 MED ORDER — CLINDAMYCIN PHOSPHATE 300 MG/50ML IV SOLN
INTRAVENOUS | Status: AC
  Filled 2017-01-08: qty 50

## 2017-01-08 MED ORDER — DEXTROSE 50 % IV SOLN
INTRAVENOUS | Status: AC
  Administered 2017-01-08: 25 mL via INTRAMUSCULAR
  Filled 2017-01-08: qty 50

## 2017-01-08 MED ORDER — METHYLPREDNISOLONE SODIUM SUCC 125 MG IJ SOLR: 125.0000 mg | INTRAMUSCULAR | Status: DC | PRN

## 2017-01-08 MED ORDER — ONDANSETRON HCL 4 MG/2ML IJ SOLN: 4.0000 mg | Freq: Four times a day (QID) | INTRAMUSCULAR | Status: DC | PRN

## 2017-01-08 MED ORDER — HYDROMORPHONE HCL 1 MG/ML IJ SOLN: 1.0000 mg | Freq: Once | INTRAMUSCULAR | Status: DC | PRN

## 2017-01-08 MED ORDER — FAMOTIDINE 20 MG PO TABS: 40.0000 mg | ORAL_TABLET | ORAL | Status: DC | PRN

## 2017-01-08 NOTE — OR Nursing (Signed)
Patient request to check FSBS.[name].[name].45 was monitor reading...25ml dextrose given per protocol. And patient given applejuice 60ml and crackers.[name].as procedure to be rescheduled

## 2017-01-10 NOTE — Progress Notes (Signed)
Unit manager at Pt's SNF, Jefm MilesLaurie Carmichael, called regarding pt's appt today, states pt ate this morning.  Vascular team informed, they state will need to reschedule and Dr. Marijean HeathSchnier's office will call facility.  Unit manager informed.

## 2017-01-28 ENCOUNTER — Ambulatory Visit
Admission: RE | Admit: 2017-01-28 | Discharge: 2017-01-28 | Disposition: A | Discharge status: Home / Self Care | Payer: Medicare Other | Source: Ambulatory Visit | Attending: Vascular Surgery | Admitting: Vascular Surgery

## 2017-01-28 ENCOUNTER — Encounter (INDEPENDENT_AMBULATORY_CARE_PROVIDER_SITE_OTHER): Payer: Self-pay

## 2017-02-11 ENCOUNTER — Encounter
Admission: RE | Disposition: A | Discharge status: Skilled Nursing Facility | Payer: Self-pay | Source: Ambulatory Visit | Attending: Vascular Surgery

## 2017-02-11 ENCOUNTER — Encounter: Payer: Self-pay | Admitting: *Deleted

## 2017-02-11 ENCOUNTER — Ambulatory Visit
Admission: RE | Admit: 2017-02-11 | Discharge: 2017-02-11 | Disposition: A | Discharge status: Skilled Nursing Facility | Payer: Medicare Other | Source: Ambulatory Visit | Attending: Vascular Surgery | Admitting: Vascular Surgery

## 2017-02-11 DIAGNOSIS — I890 Lymphedema, not elsewhere classified: Secondary | ICD-10-CM | POA: Insufficient documentation

## 2017-02-11 DIAGNOSIS — I130 Hypertensive heart and chronic kidney disease with heart failure and stage 1 through stage 4 chronic kidney disease, or unspecified chronic kidney disease: Secondary | ICD-10-CM | POA: Insufficient documentation

## 2017-02-11 DIAGNOSIS — E1122 Type 2 diabetes mellitus with diabetic chronic kidney disease: Secondary | ICD-10-CM | POA: Insufficient documentation

## 2017-02-11 DIAGNOSIS — F0280 Dementia in other diseases classified elsewhere without behavioral disturbance: Secondary | ICD-10-CM | POA: Insufficient documentation

## 2017-02-11 DIAGNOSIS — Z809 Family history of malignant neoplasm, unspecified: Secondary | ICD-10-CM | POA: Insufficient documentation

## 2017-02-11 DIAGNOSIS — Z6834 Body mass index (BMI) 34.0-34.9, adult: Secondary | ICD-10-CM | POA: Diagnosis not present

## 2017-02-11 DIAGNOSIS — E669 Obesity, unspecified: Secondary | ICD-10-CM | POA: Diagnosis not present

## 2017-02-11 DIAGNOSIS — M79605 Pain in left leg: Secondary | ICD-10-CM | POA: Diagnosis not present

## 2017-02-11 DIAGNOSIS — E1152 Type 2 diabetes mellitus with diabetic peripheral angiopathy with gangrene: Secondary | ICD-10-CM | POA: Insufficient documentation

## 2017-02-11 DIAGNOSIS — Z880 Allergy status to penicillin: Secondary | ICD-10-CM | POA: Diagnosis not present

## 2017-02-11 DIAGNOSIS — I872 Venous insufficiency (chronic) (peripheral): Secondary | ICD-10-CM | POA: Insufficient documentation

## 2017-02-11 DIAGNOSIS — I25119 Atherosclerotic heart disease of native coronary artery with unspecified angina pectoris: Secondary | ICD-10-CM | POA: Insufficient documentation

## 2017-02-11 DIAGNOSIS — M79604 Pain in right leg: Secondary | ICD-10-CM | POA: Diagnosis not present

## 2017-02-11 DIAGNOSIS — Z794 Long term (current) use of insulin: Secondary | ICD-10-CM | POA: Insufficient documentation

## 2017-02-11 DIAGNOSIS — N183 Chronic kidney disease, stage 3 (moderate): Secondary | ICD-10-CM | POA: Insufficient documentation

## 2017-02-11 DIAGNOSIS — E11621 Type 2 diabetes mellitus with foot ulcer: Secondary | ICD-10-CM | POA: Diagnosis not present

## 2017-02-11 DIAGNOSIS — I5032 Chronic diastolic (congestive) heart failure: Secondary | ICD-10-CM | POA: Diagnosis not present

## 2017-02-11 DIAGNOSIS — L97521 Non-pressure chronic ulcer of other part of left foot limited to breakdown of skin: Secondary | ICD-10-CM | POA: Insufficient documentation

## 2017-02-11 DIAGNOSIS — J449 Chronic obstructive pulmonary disease, unspecified: Secondary | ICD-10-CM | POA: Insufficient documentation

## 2017-02-11 DIAGNOSIS — I70262 Atherosclerosis of native arteries of extremities with gangrene, left leg: Secondary | ICD-10-CM

## 2017-02-11 DIAGNOSIS — E785 Hyperlipidemia, unspecified: Secondary | ICD-10-CM | POA: Insufficient documentation

## 2017-02-11 DIAGNOSIS — I7025 Atherosclerosis of native arteries of other extremities with ulceration: Secondary | ICD-10-CM | POA: Insufficient documentation

## 2017-02-11 DIAGNOSIS — G309 Alzheimer's disease, unspecified: Secondary | ICD-10-CM | POA: Diagnosis not present

## 2017-02-11 DIAGNOSIS — I2510 Atherosclerotic heart disease of native coronary artery without angina pectoris: Secondary | ICD-10-CM | POA: Diagnosis not present

## 2017-02-11 HISTORY — PX: LOWER EXTREMITY ANGIOGRAPHY: CATH118251

## 2017-02-11 LAB — BUN: BUN: 32 mg/dL — ABNORMAL HIGH (ref 6–20)

## 2017-02-11 LAB — CREATININE, SERUM
Creatinine, Ser: 1.37 mg/dL — ABNORMAL HIGH (ref 0.61–1.24)
GFR calc Af Amer: 54 mL/min — ABNORMAL LOW (ref >60)
GFR calc non Af Amer: 46 mL/min — ABNORMAL LOW (ref >60)

## 2017-02-11 LAB — GLUCOSE, CAPILLARY: Glucose-Capillary: 147 mg/dL — ABNORMAL HIGH (ref 65–99)

## 2017-02-11 SURGERY — LOWER EXTREMITY ANGIOGRAPHY
Anesthesia: Moderate Sedation | Laterality: Left

## 2017-02-11 MED ORDER — SODIUM CHLORIDE 0.9% FLUSH: 3.0000 mL | INTRAVENOUS | Status: DC | PRN

## 2017-02-11 MED ORDER — SODIUM CHLORIDE 0.9 % IV SOLN: 250.0000 mL | INTRAVENOUS | Status: DC | PRN

## 2017-02-11 MED ORDER — FENTANYL CITRATE (PF) 100 MCG/2ML IJ SOLN
INTRAMUSCULAR | Status: AC
  Filled 2017-02-11: qty 2

## 2017-02-11 MED ORDER — SODIUM CHLORIDE 0.9 % IV SOLN
INTRAVENOUS | Status: DC
  Administered 2017-02-11: 08:00:00 via INTRAVENOUS

## 2017-02-11 MED ORDER — CLINDAMYCIN PHOSPHATE 300 MG/50ML IV SOLN: 300.0000 mg | Freq: Once | INTRAVENOUS | Status: DC

## 2017-02-11 MED ORDER — LABETALOL HCL 5 MG/ML IV SOLN: 10.0000 mg | INTRAVENOUS | Status: DC | PRN

## 2017-02-11 MED ORDER — HYDRALAZINE HCL 20 MG/ML IJ SOLN: 5.0000 mg | INTRAMUSCULAR | Status: DC | PRN

## 2017-02-11 MED ORDER — MIDAZOLAM HCL 5 MG/5ML IJ SOLN
INTRAMUSCULAR | Status: AC
  Filled 2017-02-11: qty 5

## 2017-02-11 MED ORDER — MIDAZOLAM HCL 2 MG/2ML IJ SOLN
INTRAMUSCULAR | Status: DC | PRN
  Administered 2017-02-11: 2 mg via INTRAVENOUS
  Administered 2017-02-11 (×4): 1 mg via INTRAVENOUS

## 2017-02-11 MED ORDER — SODIUM CHLORIDE 0.9% FLUSH: 3.0000 mL | Freq: Two times a day (BID) | INTRAVENOUS | Status: DC

## 2017-02-11 MED ORDER — SODIUM CHLORIDE 0.9 % IV SOLN: INTRAVENOUS | Status: DC

## 2017-02-11 MED ORDER — OXYCODONE HCL 5 MG PO TABS: 5.0000 mg | ORAL_TABLET | ORAL | Status: DC | PRN

## 2017-02-11 MED ORDER — FENTANYL CITRATE (PF) 100 MCG/2ML IJ SOLN
INTRAMUSCULAR | Status: DC | PRN
  Administered 2017-02-11 (×3): 50 ug via INTRAVENOUS

## 2017-02-11 MED ORDER — LIDOCAINE HCL (PF) 1 % IJ SOLN
INTRAMUSCULAR | Status: AC
  Filled 2017-02-11: qty 30

## 2017-02-11 MED ORDER — CLINDAMYCIN PHOSPHATE 300 MG/50ML IV SOLN
300.0000 mg | Freq: Once | INTRAVENOUS | Status: AC
  Administered 2017-02-11: 300 mg via INTRAVENOUS

## 2017-02-11 MED ORDER — MORPHINE SULFATE (PF) 4 MG/ML IV SOLN: 2.0000 mg | INTRAVENOUS | Status: DC | PRN

## 2017-02-11 MED ORDER — CLINDAMYCIN PHOSPHATE 300 MG/50ML IV SOLN
INTRAVENOUS | Status: AC
  Administered 2017-02-11: 300 mg via INTRAVENOUS
  Filled 2017-02-11: qty 50

## 2017-02-11 MED ORDER — IOPAMIDOL (ISOVUE-300) INJECTION 61%
INTRAVENOUS | Status: DC | PRN
  Administered 2017-02-11: 75 mL via INTRA_ARTERIAL

## 2017-02-11 MED ORDER — HEPARIN SODIUM (PORCINE) 1000 UNIT/ML IJ SOLN
INTRAMUSCULAR | Status: AC
  Filled 2017-02-11: qty 1

## 2017-02-11 MED ORDER — HEPARIN SODIUM (PORCINE) 1000 UNIT/ML IJ SOLN
INTRAMUSCULAR | Status: DC | PRN
  Administered 2017-02-11: 5000 [IU] via INTRAVENOUS

## 2017-02-11 SURGICAL SUPPLY — 36 items
BALLN ULTRVRSE 1.5X40X150 (BALLOONS) ×3
BALLN ULTRVRSE 2.5X220X150 (BALLOONS) ×3
BALLN ULTRVRSE 2X150X150 (BALLOONS) ×3
BALLN ULTRVRSE 2X20X150 (BALLOONS) ×3
BALLOON ULTRVRSE 1.5X40X150 (BALLOONS) ×1 IMPLANT
BALLOON ULTRVRSE 2.5X220X150 (BALLOONS) ×1 IMPLANT
BALLOON ULTRVRSE 2X150X150 (BALLOONS) ×1 IMPLANT
BALLOON ULTRVRSE 2X20X150 (BALLOONS) ×1 IMPLANT
CATH CROSSER 14S OTW 146CM (CATHETERS) ×3 IMPLANT
CATH CROSSER S6 154CM (CATHETERS) ×3 IMPLANT
CATH CXI SUPP ST 2.6FR 150CM (CATHETERS) ×3 IMPLANT
CATH GWIRE MARINER STRGHT 4FR (CATHETERS) ×3 IMPLANT
CATH MICROGUIDE FINCRSS 150 CM (MICROCATHETER) ×1 IMPLANT
CATH PIG 70CM (CATHETERS) ×3 IMPLANT
CATH SIDEKICK XL ST 110CM (SHEATH) ×3 IMPLANT
CATH USHER TPER 130CM (CATHETERS) ×3 IMPLANT
DEVICE PRESTO INFLATION (MISCELLANEOUS) ×3 IMPLANT
DEVICE STARCLOSE SE CLOSURE (Vascular Products) ×3 IMPLANT
DEVICE TORQUE (MISCELLANEOUS) ×3 IMPLANT
GLIDEWIRE ADV .014X300CM (WIRE) ×3 IMPLANT
GLIDEWIRE ANGLED SS 035X260CM (WIRE) ×3 IMPLANT
IV NS 500ML (IV SOLUTION) ×2
IV NS 500ML BAXH (IV SOLUTION) ×1 IMPLANT
KIT FLOWMATE PROCEDURAL (MISCELLANEOUS) ×3 IMPLANT
MICROGUIDE FINECROSS 150 CM (MICROCATHETER) ×3
NEEDLE ENTRY 21GA 7CM ECHOTIP (NEEDLE) ×3 IMPLANT
PACK ANGIOGRAPHY (CUSTOM PROCEDURE TRAY) ×3 IMPLANT
SET INTRO CAPELLA COAXIAL (SET/KITS/TRAYS/PACK) ×3 IMPLANT
SHEATH BRITE TIP 5FRX11 (SHEATH) ×3 IMPLANT
SHEATH FLEXOR ANSEL2 7FRX45 (SHEATH) ×3 IMPLANT
SYR MEDRAD MARK V 150ML (SYRINGE) ×3 IMPLANT
TOWEL OR 17X26 4PK STRL BLUE (TOWEL DISPOSABLE) ×3 IMPLANT
TUBING CONTRAST HIGH PRESS 72 (TUBING) ×3 IMPLANT
WIRE COMMAND ST 018 300CM (WIRE) ×3 IMPLANT
WIRE J 3MM .035X145CM (WIRE) ×3 IMPLANT
WIRE SPARTACORE .014X300CM (WIRE) ×3 IMPLANT

## 2017-02-11 NOTE — Op Note (Signed)
[facility] Percutaneous Study/Intervention Procedural Note   Date of Surgery: 02/11/2017  Surgeon:  Katha Cabal, MD.  Pre-operative Diagnosis: Atherosclerotic occlusive disease bilateral lower extremities with ulceration and gangrene of the left lower extremity   Post-operative diagnosis: Same  Procedure(s) Performed: 1. Introduction catheter into left lower extremity 3rd order catheter placement  2. Contrast injection left lower extremity for distal runoff   3. Crosser atherectomy of the left anterior tibial artery 4. Percutaneous transluminal angioplasty left anterior tibial artery with a 2 mm balloon             5.  Star close closure right common femoral arteriotomy                Anesthesia: Conscious sedation was administered under my direct supervision by the interventional radiology RN. IV Versed plus fentanyl were utilized. Continuous ECG, pulse oximetry and blood pressure was monitored throughout the entire procedure. Conscious sedation was for a total of 79 minutes.  Sheath: 7 [place] Ansell  Contrast: 75 cc  Fluoroscopy Time: 15.9 minutes  Indications: Billy Liu presents with atherosclerotic occlusive disease bilateral lower extremities. The patient has developed ulceration and gangrene of the soft tissues of the left lower extremity. This places the patient at high risk for limb loss and amputation. The risks and benefits are reviewed all questions answered patient agrees to proceed.  Procedure: Billy Liu is a 84 y.o. y.o. male who was identified and appropriate procedural time out was performed. The patient was then placed supine on the table and prepped and draped in the usual sterile fashion.   Ultrasound was placed in the sterile sleeve and the right groin was evaluated the right common femoral artery was echolucent and pulsatile indicating patency.  Image was recorded  for the permanent record and under real-time visualization a microneedle was inserted into the common femoral artery microwire followed by a micro-sheath.  A J-wire was then advanced through the micro-sheath and a  5 [place] sheath was then inserted over a J-wire. J-wire was then advanced and a 5 French pigtail catheter was positioned at the level of T12. AP projection of the aorta was then obtained. Pigtail catheter was repositioned to above the bifurcation and a RAO view of the pelvis was obtained.  Subsequently a rim catheter with the stiff angle Glidewire was used to cross the aortic bifurcation the catheter wire were advanced down into the left distal external iliac artery. Oblique view of the femoral bifurcation was then obtained and subsequently the wire was reintroduced and the pigtail catheter negotiated into the SFA representing third order catheter placement. Distal runoff was then performed.  5000 units of heparin was then given and allowed to circulate and a 7 [place] Ansell sheath was advanced up and over the bifurcation and positioned in the common femoral artery.  Wire and catheter were then used to negotiate the SFA and popliteal after which the catheter was then positioned in the distal popliteal where magnified imaging of the trifurcation was performed.  The wire was then negotiated into the anterior tibial which occludes approximately 3 cm distal to its origin  The 14 asked Crosser catheter and later in the case a Crosser [place] catheter was then prepped on the field and a straight side kick initially and later an angled pressure catheter was advanced into the cul-de-sac of the anterior tibial under magnified imaging. Using the Crosser catheter the occlusion of the anterior tibial was negotiated.  Wire was then negotiated  down into the dorsalis pedis.  A 2 mm x 4 cm Ultraverse balloon was then used to angioplasty the proximal anterior tibial artery. Inflations were to 10-12 atm for 1 full  minute.  However catheter could not be advanced into the dorsalis pedis through the distal portion of the anterior tibial.  Given that the wire and SX atherectomy catheter made it through but the balloons are not I will plan for atherectomy using the CXI catheter which will be better able to handle the dense calcifications.  This is not available at this time and so I will reschedule the second half of the procedure within the next week or 2.  After review of these images the sheath is pulled into the right external iliac oblique of the common femoral is obtained and a Star close device deployed. There no immediate Complications.  Findings: The abdominal aorta is opacified with a bolus injection contrast.  The aorta itself has diffuse disease but no hemodynamically significant lesions. The common and external iliac arteries are widely patent bilaterally.  The left common femoral is widely patent as is the profunda femoris.  The SFA and popliteal are patent.  The peroneal is patent down to the ankle but collateralizes poorly.  Anterior tibial occludes several centimeters past its origin and is reconstituted near the ankle feeling a dorsalis pedis it fills the pedal arch.  Posterior tibial is occluded throughout its course does not reconstitute distally.  Following following atherectomy of the anterior tibial tibial I am now able to get a wire down into the dorsalis pedis and therefore I have adequate preparation for a CXI catheter.  As noted above this is not available at this time and the patient will be rescheduled once this instrument has been obtained.   Disposition: Patient was taken to the recovery room in stable condition having tolerated the procedure well.  Billy Liu 02/11/2017,11:28 AM

## 2017-02-11 NOTE — H&P (Signed)
[facility] Admission History & Physical  MRN : [id number]  Billy Liu is a 84 y.o. (14-Mar-1932) male who presents with chief complaint of No chief complaint on file. [name]  History of Present Illness:  Patient presents to [facility] today for treatment of his leg pain and swelling associated with venous ulceration. The patient was seen in the office approximately a month and a half ago and started on Unna boot therapy.  The swelling abruptly became much worse at that time and was noted to be bilaterally is associated with pain and discoloration. The pain and swelling worsens with prolonged dependency and improves with elevation.  The patient notes that in the morning the legs are better but the leg symptoms worsened throughout the course of the day. The patient has also noted a progressive worsening of the discoloration in the ankle and shin area.   The patient notes that an ulcer has developed acutely without specific trauma and since it occurred it has been very slow to heal.  There is a moderate amount of drainage associated with the open area.  The wound is also very painful.  The patient notes that they were not able to tolerate the Unna boot and removed it several days ago.  The patient states that they have been elevating as much as possible. The patient denies any recent changes in medications.  The patient denies a history of DVT or PE. There is no prior history of phlebitis. There is no history of primary lymphedema.  No SOB or increased cough.  No sputum production.  No recent episodes of CHF exacerbation.     Current Facility-Administered Medications  Medication Dose Route Frequency Provider Last Rate Last Dose  . 0.9 %  sodium chloride infusion   Intravenous Continuous Graves Nipp, Latina Craver, MD 75 mL/hr at 02/11/17 [phone]    . clindamycin (CLEOCIN) 300 MG/50ML IVPB           . clindamycin (CLEOCIN) IVPB 300 mg  300 mg  Intravenous Once Laxmi Choung, Latina Craver, MD        Past Medical History:  Diagnosis Date  . Anxiety   . Cellulitis 10/17/2014   left lower leg  . Chronic diastolic CHF (congestive heart failure) (HCC)   . CKD (chronic kidney disease), stage III (HCC)   . COPD (chronic obstructive pulmonary disease) (HCC)   . Coronary artery disease   . Dementia    Alzheimer's   . Depression   . Diabetes mellitus without complication (HCC)   . GERD (gastroesophageal reflux disease)   . Hyperlipemia   . Hypertension   . Obesity   . Renal disorder     History reviewed. No pertinent surgical history.  Social History Social History   Tobacco Use  . Smoking status: Never Smoker  . Smokeless tobacco: Former Neurosurgeon    Types: Chew  Substance Use Topics  . Alcohol use: No  . Drug use: No    Family History Family History  Problem Relation Age of Onset  . Cancer Brother   No family history of bleeding/clotting disorders, porphyria or autoimmune disease   Allergies  Allergen Reactions  . Penicillins Other (See Comments)    Per MAR      REVIEW OF SYSTEMS (Negative unless checked)  Constitutional: [] Weight loss  [] Fever  [] Chills Cardiac: [] Chest pain   [] Chest pressure   [] Palpitations   [] Shortness of breath when laying flat   [] Shortness of breath at rest   []   Shortness of breath with exertion. Vascular:  [] Pain in legs with walking   [x] Pain in legs at rest   [] Pain in legs when laying flat   [] Claudication   [] Pain in feet when walking  [] Pain in feet at rest  [] Pain in feet when laying flat   [] History of DVT   [] Phlebitis   [x] Swelling in legs   [] Varicose veins   [x] Non-healing ulcers Pulmonary:   [] Uses home oxygen   [] Productive cough   [] Hemoptysis   [] Wheeze  [] COPD   [] Asthma Neurologic:  [] Dizziness  [] Blackouts   [] Seizures   [] History of stroke   [] History of TIA  [] Aphasia   [] Temporary blindness   [] Dysphagia   [] Weakness or numbness in arms   [] Weakness or numbness in  legs Musculoskeletal:  [] Arthritis   [] Joint swelling   [] Joint pain   [] Low back pain Hematologic:  [] Easy bruising  [] Easy bleeding   [] Hypercoagulable state   [] Anemic  [] Hepatitis Gastrointestinal:  [] Blood in stool   [] Vomiting blood  [] Gastroesophageal reflux/heartburn   [] Difficulty swallowing. Genitourinary:  [] Chronic kidney disease   [] Difficult urination  [] Frequent urination  [] Burning with urination   [] Blood in urine Skin:  [x] Rashes   [x] Ulcers   [] Wounds Psychological:  [] History of anxiety   []  History of major depression.  Physical Examination  Vitals:   02/11/17 0754  BP: (!) 148/73  Pulse: (!) 59  Resp: 18  Temp: 97.9 F (36.6 C)  SpO2: 96%  Weight: 261 lb (118.4 kg)  Height: 6\' 1"  (1.854 m)   Body mass index is 34.43 kg/m. Gen: WD/WN, NAD Head: [place]/AT, No temporalis wasting. Prominent temp pulse not noted. Ear/Nose/Throat: Hearing grossly intact, nares w/o erythema or drainage, oropharynx w/o Erythema/Exudate,  Eyes: Conjunctiva clear, sclera non-icteric Neck: Trachea midline.  No JVD.  Pulmonary:  Good air movement, respirations not labored, no use of accessory muscles.  Cardiac: RRR, normal S1, S2. Vascular: 2-3+ edema is noted bilaterally with severe venous stasis changes noted bilaterally.  Venous ulcer is noted in the right ankle area Vessel Right Left  Radial Palpable Palpable  PT  not palpable  not palpable  DP  not palpable  not palpable   Gastrointestinal: soft, non-tender/non-distended. No guarding/reflex.  Musculoskeletal: M/S 5/5 throughout.  Extremities without ischemic changes.  No deformity or atrophy.  Neurologic: Sensation grossly intact in extremities.  Symmetrical.  Speech is fluent. Motor exam as listed above. Psychiatric: Judgment intact, Mood & affect appropriate for pt's clinical situation. Dermatologic: Severe venous rashes with ulcers noted.  No cellulitis or open wounds. Lymph : No Cervical, Axillary, or Inguinal  lymphadenopathy.     CBC Lab Results  Component Value Date   WBC 5.6 10/27/2014   HGB 9.2 (L) 10/27/2014   HCT 29.1 (L) 10/27/2014   MCV 88.9 10/27/2014   PLT 91 (L) 10/27/2014    BMET    Component Value Date/Time   NA 137 10/31/2014 0939   NA 133 (L) 07/04/2013 1030   K 4.8 10/31/2014 0939   K 4.3 07/04/2013 1030   CL 102 10/31/2014 0939   CL 98 07/04/2013 1030   CO2 29 10/31/2014 0939   CO2 29 07/04/2013 1030   GLUCOSE 139 (H) 10/31/2014 0939   GLUCOSE 83 07/04/2013 1030   BUN 32 (H) 02/11/2017 0817   BUN 21 (H) 07/04/2013 1030   CREATININE 1.37 (H) 02/11/2017 0817   CREATININE 1.81 (H) 07/04/2013 1030   CALCIUM 8.9 10/31/2014 0939   CALCIUM 8.7 07/04/2013 1030  GFRNONAA 46 (L) 02/11/2017 0817   GFRNONAA 35 (L) 07/04/2013 1030   GFRAA 54 (L) 02/11/2017 0817   GFRAA 40 (L) 07/04/2013 1030   Estimated Creatinine Clearance: 56 mL/min (A) (by C-G formula based on SCr of 1.37 mg/dL (H)).  COAG No results found for: INR, PROTIME  Radiology No results found.  Assessment/Plan 1. Atherosclerosis of native arteries of the extremities with ulceration (HCC)  Recommend:  The patient has evidence of severe atherosclerotic changes of both lower extremities associated with ulceration and tissue loss of the left foot.  This represents a limb threatening ischemia and places the patient at the risk for limb loss.  Patient should undergo angiography of the left lower extremity with the hope for intervention for limb salvage.  The risks and benefits as well as the alternative therapies was discussed in detail with the patient.  All questions were answered.  Patient agrees to proceed with angiography.  The patient will follow up with me in the office after the procedure.    2. Chronic venous insufficiency No surgery or intervention at this point in time.    I have had a long discussion with the patient regarding venous insufficiency and why it  causes symptoms. I have  discussed with the patient the chronic skin changes that accompany venous insufficiency and the long term sequela such as infection and ulceration.  Patient will begin wearing graduated compression stockings class 1 (20-30 mmHg) or compression wraps on a daily basis a prescription was given. The patient will put the stockings on first thing in the morning and removing them in the evening. The patient is instructed specifically not to sleep in the stockings.    In addition, behavioral modification including several periods of elevation of the lower extremities during the day will be continued. I have demonstrated that proper elevation is a position with the ankles at heart level.  The patient is instructed to begin routine exercise, especially walking on a daily basis  Following the review of the ultrasound the patient will follow up in 2-3 months to reassess the degree of swelling and the control that graduated compression stockings or compression wraps  is offering.   The patient can be assessed for a Lymph Pump at that time  3. Lymphedema No surgery or intervention at this point in time.    I have had a long discussion with the patient regarding venous insufficiency and why it  causes symptoms. I have discussed with the patient the chronic skin changes that accompany venous insufficiency and the long term sequela such as infection and ulceration.  Patient will begin wearing graduated compression stockings class 1 (20-30 mmHg) or compression wraps on a daily basis a prescription was given. The patient will put the stockings on first thing in the morning and removing them in the evening. The patient is instructed specifically not to sleep in the stockings.    In addition, behavioral modification including several periods of elevation of the lower extremities during the day will be continued. I have demonstrated that proper elevation is a position with the ankles at heart level.  The patient is  instructed to begin routine exercise, especially walking on a daily basis  Following the review of the ultrasound the patient will follow up in 2-3 months to reassess the degree of swelling and the control that graduated compression stockings or compression wraps  is offering.   The patient can be assessed for a Lymph Pump at that time  4.  Coronary artery disease involving native coronary artery of native heart with angina pectoris (HCC) Continue cardiac and antihypertensive medications as already ordered and reviewed, no changes at this time.  Continue statin as ordered and reviewed, no changes at this time  Nitrates PRN for chest pain   5. Simple chronic bronchitis (HCC) Continue pulmonary medications and aerosols as already ordered, these medications have been reviewed and there are no changes at this time.    6. Type 2 diabetes mellitus with stage 3 chronic kidney disease, with long-term current use of insulin (HCC) Continue hypoglycemic medications as already ordered, these medications have been reviewed and there are no changes at this time.  Hgb A1C to be monitored as already arranged by primary service      Levora DredgeGregory Amalea Ottey, MD  02/11/2017 9:00 AM

## 2017-02-12 ENCOUNTER — Encounter: Payer: Self-pay | Admitting: Vascular Surgery

## 2017-02-13 ENCOUNTER — Encounter (INDEPENDENT_AMBULATORY_CARE_PROVIDER_SITE_OTHER): Payer: Self-pay

## 2017-02-18 ENCOUNTER — Other Ambulatory Visit (INDEPENDENT_AMBULATORY_CARE_PROVIDER_SITE_OTHER): Payer: Self-pay

## 2017-02-18 ENCOUNTER — Ambulatory Visit
Admission: RE | Admit: 2017-02-18 | Discharge: 2017-02-18 | Disposition: A | Discharge status: Skilled Nursing Facility | Payer: Medicare Other | Source: Ambulatory Visit | Attending: Vascular Surgery | Admitting: Vascular Surgery

## 2017-02-18 ENCOUNTER — Encounter
Admission: RE | Disposition: A | Discharge status: Skilled Nursing Facility | Payer: Self-pay | Source: Ambulatory Visit | Attending: Vascular Surgery

## 2017-02-18 DIAGNOSIS — I5032 Chronic diastolic (congestive) heart failure: Secondary | ICD-10-CM | POA: Insufficient documentation

## 2017-02-18 DIAGNOSIS — I872 Venous insufficiency (chronic) (peripheral): Secondary | ICD-10-CM | POA: Diagnosis not present

## 2017-02-18 DIAGNOSIS — E785 Hyperlipidemia, unspecified: Secondary | ICD-10-CM | POA: Insufficient documentation

## 2017-02-18 DIAGNOSIS — N183 Chronic kidney disease, stage 3 (moderate): Secondary | ICD-10-CM | POA: Insufficient documentation

## 2017-02-18 DIAGNOSIS — I25119 Atherosclerotic heart disease of native coronary artery with unspecified angina pectoris: Secondary | ICD-10-CM | POA: Insufficient documentation

## 2017-02-18 DIAGNOSIS — E11621 Type 2 diabetes mellitus with foot ulcer: Secondary | ICD-10-CM | POA: Insufficient documentation

## 2017-02-18 DIAGNOSIS — Z809 Family history of malignant neoplasm, unspecified: Secondary | ICD-10-CM | POA: Diagnosis not present

## 2017-02-18 DIAGNOSIS — E1122 Type 2 diabetes mellitus with diabetic chronic kidney disease: Secondary | ICD-10-CM | POA: Insufficient documentation

## 2017-02-18 DIAGNOSIS — E669 Obesity, unspecified: Secondary | ICD-10-CM | POA: Diagnosis not present

## 2017-02-18 DIAGNOSIS — Z794 Long term (current) use of insulin: Secondary | ICD-10-CM | POA: Insufficient documentation

## 2017-02-18 DIAGNOSIS — Z880 Allergy status to penicillin: Secondary | ICD-10-CM | POA: Insufficient documentation

## 2017-02-18 DIAGNOSIS — I890 Lymphedema, not elsewhere classified: Secondary | ICD-10-CM | POA: Insufficient documentation

## 2017-02-18 DIAGNOSIS — L97421 Non-pressure chronic ulcer of left heel and midfoot limited to breakdown of skin: Secondary | ICD-10-CM | POA: Insufficient documentation

## 2017-02-18 DIAGNOSIS — J449 Chronic obstructive pulmonary disease, unspecified: Secondary | ICD-10-CM | POA: Insufficient documentation

## 2017-02-18 DIAGNOSIS — I70268 Atherosclerosis of native arteries of extremities with gangrene, other extremity: Secondary | ICD-10-CM | POA: Insufficient documentation

## 2017-02-18 DIAGNOSIS — I7090 Unspecified atherosclerosis: Secondary | ICD-10-CM

## 2017-02-18 DIAGNOSIS — I70262 Atherosclerosis of native arteries of extremities with gangrene, left leg: Secondary | ICD-10-CM

## 2017-02-18 DIAGNOSIS — I130 Hypertensive heart and chronic kidney disease with heart failure and stage 1 through stage 4 chronic kidney disease, or unspecified chronic kidney disease: Secondary | ICD-10-CM | POA: Insufficient documentation

## 2017-02-18 HISTORY — PX: PERIPHERAL VASCULAR ATHERECTOMY: CATH118256

## 2017-02-18 LAB — GLUCOSE, CAPILLARY
GLUCOSE-CAPILLARY: 128 mg/dL — AB (ref 65–99)
GLUCOSE-CAPILLARY: 143 mg/dL — AB (ref 65–99)

## 2017-02-18 SURGERY — PERIPHERAL VASCULAR ATHERECTOMY
Anesthesia: Moderate Sedation | Laterality: Left

## 2017-02-18 MED ORDER — HEPARIN (PORCINE) IN NACL 2-0.9 UNIT/ML-% IJ SOLN
INTRAMUSCULAR | Status: AC
  Filled 2017-02-18: qty 1000

## 2017-02-18 MED ORDER — MIDAZOLAM HCL 5 MG/5ML IJ SOLN
INTRAMUSCULAR | Status: AC
  Filled 2017-02-18: qty 5

## 2017-02-18 MED ORDER — LIDOCAINE HCL (PF) 1 % IJ SOLN
INTRAMUSCULAR | Status: AC
  Filled 2017-02-18: qty 30

## 2017-02-18 MED ORDER — HEPARIN SODIUM (PORCINE) 1000 UNIT/ML IJ SOLN
INTRAMUSCULAR | Status: AC
Start: 2017-02-18 — End: 2017-02-18
  Filled 2017-02-18: qty 1

## 2017-02-18 MED ORDER — IOPAMIDOL (ISOVUE-300) INJECTION 61%
INTRAVENOUS | Status: DC | PRN
  Administered 2017-02-18: 50 mL via INTRA_ARTERIAL

## 2017-02-18 MED ORDER — FENTANYL CITRATE (PF) 100 MCG/2ML IJ SOLN
INTRAMUSCULAR | Status: DC | PRN
  Administered 2017-02-18: 50 ug via INTRAVENOUS
  Administered 2017-02-18: 25 ug via INTRAVENOUS
  Administered 2017-02-18 (×3): 50 ug via INTRAVENOUS
  Administered 2017-02-18: 25 ug via INTRAVENOUS

## 2017-02-18 MED ORDER — HEPARIN SODIUM (PORCINE) 1000 UNIT/ML IJ SOLN
INTRAMUSCULAR | Status: DC | PRN
  Administered 2017-02-18: 5000 [IU] via INTRAVENOUS

## 2017-02-18 MED ORDER — FENTANYL CITRATE (PF) 100 MCG/2ML IJ SOLN
INTRAMUSCULAR | Status: AC
  Filled 2017-02-18: qty 2

## 2017-02-18 MED ORDER — CLINDAMYCIN PHOSPHATE 300 MG/50ML IV SOLN
300.0000 mg | Freq: Once | INTRAVENOUS | Status: AC
  Administered 2017-02-18: 300 mg via INTRAVENOUS

## 2017-02-18 MED ORDER — CLINDAMYCIN PHOSPHATE 300 MG/50ML IV SOLN
INTRAVENOUS | Status: AC
  Administered 2017-02-18: 300 mg via INTRAVENOUS
  Filled 2017-02-18: qty 50

## 2017-02-18 MED ORDER — SODIUM CHLORIDE 0.9% FLUSH: 3.0000 mL | INTRAVENOUS | Status: DC | PRN

## 2017-02-18 MED ORDER — SODIUM CHLORIDE 0.9 % IJ SOLN
INTRAMUSCULAR | Status: AC
  Filled 2017-02-18: qty 50

## 2017-02-18 MED ORDER — SODIUM CHLORIDE 0.9 % IV SOLN: 250.0000 mL | INTRAVENOUS | Status: DC | PRN

## 2017-02-18 MED ORDER — HYDRALAZINE HCL 20 MG/ML IJ SOLN
INTRAMUSCULAR | Status: DC | PRN
  Administered 2017-02-18: 10 mg via INTRAVENOUS

## 2017-02-18 MED ORDER — CEFAZOLIN SODIUM-DEXTROSE 2-4 GM/100ML-% IV SOLN
INTRAVENOUS | Status: AC
  Filled 2017-02-18: qty 100

## 2017-02-18 MED ORDER — LABETALOL HCL 5 MG/ML IV SOLN: 10.0000 mg | INTRAVENOUS | Status: DC | PRN

## 2017-02-18 MED ORDER — SODIUM CHLORIDE 0.9 % IV SOLN
INTRAVENOUS | Status: DC
  Administered 2017-02-18: 13:00:00 via INTRAVENOUS

## 2017-02-18 MED ORDER — HYDRALAZINE HCL 20 MG/ML IJ SOLN: 5.0000 mg | INTRAMUSCULAR | Status: DC | PRN

## 2017-02-18 MED ORDER — NITROGLYCERIN 1 MG/10 ML FOR IR/CATH LAB
INTRA_ARTERIAL | Status: DC | PRN
  Administered 2017-02-18: 500 ug

## 2017-02-18 MED ORDER — MORPHINE SULFATE (PF) 4 MG/ML IV SOLN: 2.0000 mg | INTRAVENOUS | Status: DC | PRN

## 2017-02-18 MED ORDER — SODIUM CHLORIDE 0.9 % IV SOLN: INTRAVENOUS | Status: DC

## 2017-02-18 MED ORDER — HYDRALAZINE HCL 20 MG/ML IJ SOLN
INTRAMUSCULAR | Status: AC
  Filled 2017-02-18: qty 1

## 2017-02-18 MED ORDER — OXYCODONE HCL 5 MG PO TABS: 5.0000 mg | ORAL_TABLET | ORAL | Status: DC | PRN

## 2017-02-18 MED ORDER — NITROGLYCERIN 5 MG/ML IV SOLN
INTRAVENOUS | Status: AC
  Filled 2017-02-18: qty 10

## 2017-02-18 MED ORDER — SODIUM CHLORIDE 0.9% FLUSH: 3.0000 mL | Freq: Two times a day (BID) | INTRAVENOUS | Status: DC

## 2017-02-18 MED ORDER — MIDAZOLAM HCL 5 MG/5ML IJ SOLN
INTRAMUSCULAR | Status: AC
Start: 2017-02-18 — End: 2017-02-18
  Filled 2017-02-18: qty 5

## 2017-02-18 MED ORDER — MIDAZOLAM HCL 2 MG/2ML IJ SOLN
INTRAMUSCULAR | Status: DC | PRN
  Administered 2017-02-18 (×3): 1 mg via INTRAVENOUS
  Administered 2017-02-18: 2 mg via INTRAVENOUS
  Administered 2017-02-18 (×2): 1 mg via INTRAVENOUS

## 2017-02-18 SURGICAL SUPPLY — 27 items
CATH BEACON 5 .035 65 RIM TIP (CATHETERS) ×3 IMPLANT
CATH CXI SUPP ANG 4FR 135 (MICROCATHETER) ×1 IMPLANT
CATH CXI SUPP ANG 4FR 135CM (MICROCATHETER) ×3
CATH SEEKER .018X150 (CATHETERS) ×3 IMPLANT
CROWN STEALTH MICRO-30 1.25MM (CATHETERS) ×3 IMPLANT
DEVICE PRESTO INFLATION (MISCELLANEOUS) ×3 IMPLANT
DEVICE STARCLOSE SE CLOSURE (Vascular Products) ×3 IMPLANT
DEVICE TORQUE (MISCELLANEOUS) ×3 IMPLANT
DRAPE INCISE IOBAN 66X45 STRL (DRAPES) ×3 IMPLANT
GLIDECATH ANGLED 4FR 120CM (CATHETERS) ×3 IMPLANT
GLIDEWIRE ADV .014X300CM (WIRE) ×3 IMPLANT
GUIDEWIRE LT ZIPWIRE 035X260 (WIRE) ×3 IMPLANT
LUBRICANT VIPERSLIDE CORONARY (MISCELLANEOUS) ×3 IMPLANT
NEEDLE ENTRY 21GA 7CM ECHOTIP (NEEDLE) ×6 IMPLANT
SET INTRO CAPELLA COAXIAL (SET/KITS/TRAYS/PACK) ×6 IMPLANT
SHEATH BRITE TIP 5FRX11 (SHEATH) ×3 IMPLANT
SHEATH BRITE TIP 6FRX11 (SHEATH) ×3 IMPLANT
SHEATH SHUTTLE SELECT 6F (SHEATH) ×3 IMPLANT
SHIELD X-DRAPE GOLD 12X17 (MISCELLANEOUS) ×3 IMPLANT
STOPCOCK 3 WAY MALE LL (IV SETS) ×2
STOPCOCK 3WAY MALE LL (IV SETS) ×1 IMPLANT
STOPCOCK ROT 3 WAY (MISCELLANEOUS) ×2
STOPCOCK ROT 3WAY (MISCELLANEOUS) ×1 IMPLANT
VALVE CHECKFLO PERFORMER (SHEATH) ×3 IMPLANT
WIRE J 3MM .035X145CM (WIRE) ×3 IMPLANT
WIRE NITINOL .018 (WIRE) ×3 IMPLANT
WIRE VIPER WIRECTO 0.014 (WIRE) ×6 IMPLANT

## 2017-02-18 NOTE — Progress Notes (Signed)
MD paged and updated on Pt status, ok to d/c Pt to SNF. MD to discuss Pt procedure and POC at office visit scheduled 2/25

## 2017-02-18 NOTE — Op Note (Signed)
OPERATIVE NOTE   PROCEDURE: 1. Left lower extremity angiography third order catheter placement 2. Ultrasound-guided access right common femoral artery for sheath placement 3. CSI diamondback atherectomy right anterior tibial incomplete 4. Attempted pedal access unsuccessful  PRE-OPERATIVE DIAGNOSIS: Atherosclerotic occlusive disease bilateral lower extremities with gangrene of the left foot.  POST-OPERATIVE DIAGNOSIS: Same  SURGEON: Renford Dills, M.D.  ANESTHESIA: Conscious sedation was administered under my direct supervision by the interventional radiology RN. IV Versed plus fentanyl were utilized. Continuous ECG, pulse oximetry and blood pressure was monitored throughout the entire procedure.  Conscious sedation was for a total of 142 minutes.  ESTIMATED BLOOD LOSS: Minimal cc  FINDING(S): 1.  Diffuse atherosclerotic changes of the left lower extremity, the peroneal is intact and fills the foot via collaterals the anterior tibial is reconstituted at the level of the ankle and fills the dorsalis pedis which supplies the pedal arch  SPECIMEN(S):  None  INDICATIONS:   Billy Liu is a 84 y.o. y.o. male who presents with signs of sepsis secondary to infection of the right heel ulcer. He has nonpalpable pulses in association with the heel ulcer. The heel ulcer has not been healing in spite of appropriate wound care.  DESCRIPTION: After obtaining full informed written consent, the patient was brought back to the operating room and placed supine upon the operating table.  The patient received IV antibiotics prior to induction.  After obtaining adequate sedation, the patient was prepped and draped in the standard fashion and appropriate time out is called.    Ultrasound is placed in a sterile sleeve ultrasound as utilized secondary to lack of appropriate landmarks and to avoid vascular injury. Under real-time visualization the right common femoral artery is identified is  echolucent and pulsatile indicating patency. Image is recorded for the permanent record. 1% lidocaine is then infiltrated into the soft tissues with ultrasound visualization. Microneedle is then inserted into the anterior wall of the common femoral artery under direct visualization with ultrasound. Microwire followed by micro-sheath.   J-wire followed by 6 French sheath is then inserted without difficulty.  Using a Stiff angled Glide Wire wire and the rim catheter the aortic bifurcation was crossed and the catheter is advanced down to the external iliac artery. Oblique view of the femoral bifurcation is then obtained by hand injection. The wire is then reintroduced and the catheter is positioned within the SFA.  A 6 French 90 cm shuttle is then advanced up and over the bifurcation using a stiff angled Glidewire AP projections of the right lower extremity are obtained for distal runoff.  5000 units of heparin is given.  Wire and catheter were negotiated into the anterior tibial and using a series of catheters and different wires attempts at crossing the entire lesion are performed.  Ultimately I was able to get down near the level of the ankle because of the tremendous calcifications within the artery it was hard to manipulate wires and catheters small hand-injection of contrast at the mid anterior tibial level did demonstrate we were intraluminal and I elected to treat the proximal half of the anterior tibial with the diamondback.  Atherectomy was performed multiple passes were made.  Significant improvement was noted in the proximal half of the anterior tibial however I was not able to negotiate a wire and catheter into the true lumen at the level of the ankle.  Attempts at accessing the dorsalis pedis directly in a retrograde fashion were unsuccessful.  Given that we had used over 30  minutes of fluoroscopy time I elected to terminate the procedure.  After review of the images the catheter is removed  sheath is pulled back into the left iliac system J-wire is then advanced and socially and RAO projection of the groin is obtained after review of this image a Star Cose device is deployed without difficulty.  INTERPRETATION: The left common femoral and profunda femoris arteries are widely patent. The left superficial femoral artery demonstrates diffuse disease as does the popliteal but there are no hemodynamically significant lesions noted. The trifurcation is patent and the peroneal fills directly although distally it does not have extensive collaterals to the forefoot. The posterior tibial artery is occluded throughout its entire course and there is no reconstitution of the lateral plantar arteries noted.  Anterior tibial occludes shortly after its origin and reconstitutes at the level of the ankle and fills a patent dorsalis pedis pedal arch and forefoot digital vessels.  Clearly, the anterior tibial if it were in continuity would be the dominant and optimal artery for wound healing.  Attempts at atherectomy are successful in the proximal half however reentry and completion of the atherectomy distally was unsuccessful and the case was terminated.  COMPLICATIONS: There were no immediate consultations.  CONDITION: Billy HatchetGood  Billy Liu, M.D. [facility] Office: [phone]   02/18/2017,4:47 PM

## 2017-02-18 NOTE — H&P (Signed)
[facility] History & Physical Update  The patient was interviewed and re-examined.  The patient's previous History and Physical has been reviewed and is unchanged.  There is no change in the plan of care. We plan to proceed with the scheduled procedure.  Levora DredgeGregory Jerrika Ledlow, MD  02/18/2017, 1:14 PM

## 2017-02-19 ENCOUNTER — Encounter: Payer: Self-pay | Admitting: Vascular Surgery

## 2017-03-03 ENCOUNTER — Ambulatory Visit (INDEPENDENT_AMBULATORY_CARE_PROVIDER_SITE_OTHER): Payer: Medicare Other | Admitting: Vascular Surgery

## 2017-03-03 ENCOUNTER — Other Ambulatory Visit (INDEPENDENT_AMBULATORY_CARE_PROVIDER_SITE_OTHER): Payer: Self-pay | Admitting: Vascular Surgery

## 2017-03-03 ENCOUNTER — Encounter (INDEPENDENT_AMBULATORY_CARE_PROVIDER_SITE_OTHER): Payer: Medicare Other

## 2017-03-03 ENCOUNTER — Encounter (INDEPENDENT_AMBULATORY_CARE_PROVIDER_SITE_OTHER): Payer: Self-pay | Admitting: Vascular Surgery

## 2017-03-03 VITALS — BP 156/62 | HR 64 | Resp 16 | Ht 77.0 in | Wt 264.0 lb

## 2017-03-03 DIAGNOSIS — J410 Simple chronic bronchitis: Secondary | ICD-10-CM | POA: Diagnosis not present

## 2017-03-03 DIAGNOSIS — I7025 Atherosclerosis of native arteries of other extremities with ulceration: Secondary | ICD-10-CM

## 2017-03-03 DIAGNOSIS — I10 Essential (primary) hypertension: Secondary | ICD-10-CM | POA: Diagnosis not present

## 2017-03-03 DIAGNOSIS — I25119 Atherosclerotic heart disease of native coronary artery with unspecified angina pectoris: Secondary | ICD-10-CM | POA: Diagnosis not present

## 2017-03-04 ENCOUNTER — Encounter (INDEPENDENT_AMBULATORY_CARE_PROVIDER_SITE_OTHER): Payer: Self-pay

## 2017-03-05 ENCOUNTER — Encounter (INDEPENDENT_AMBULATORY_CARE_PROVIDER_SITE_OTHER): Payer: Self-pay | Admitting: Vascular Surgery

## 2017-03-05 NOTE — Progress Notes (Signed)
MRN : [id number]  Billy Liu is a 84 y.o. (04/28/1932) male who presents with chief complaint of  Chief Complaint  Patient presents with  . Follow-up    ultrasound  .  History of Present Illness: The patient returns to the office for followup and review status post angiogram with unsuccessful intervention. The patient notes no  improvement in the lower extremity symptoms. Previous wounds are unchanged.    There have been no significant changes to the patient's overall health care.  The patient denies amaurosis fugax or recent TIA symptoms. There are no recent neurological changes noted. The patient denies history of DVT, PE or superficial thrombophlebitis. The patient denies recent episodes of angina or shortness of breath.   Duplex US of the left foot does show a patent DP of good caliber with good flow   Current Meds  Medication Sig  . acetaminophen (TYLENOL) 500 MG tablet Take 500 mg by mouth 3 (three) times daily.  [name]. amLODipine (NORVASC) 10 MG tablet Take 10 mg by mouth daily.  [name]. aspirin EC 81 MG tablet Take 81 mg by mouth daily.  . cloNIDine (CATAPRES) 0.2 MG tablet Take 1 tablet (0.2 mg total) by mouth 3 (three) times daily. (Patient taking differently: Take 0.2 mg by mouth 2 (two) times daily. )  . divalproex (DEPAKOTE ER) 250 MG 24 hr tablet Take 250 mg by mouth daily.  . divalproex (DEPAKOTE) 500 MG DR tablet Take 500 mg by mouth at bedtime.   . finasteride (PROSCAR) 5 MG tablet Take 1 tablet (5 mg total) by mouth daily.  [name]. glucagon, human recombinant, (GLUCAGEN DIAGNOSTIC) 1 MG injection Inject 1 mg into the muscle once as needed for low blood sugar.  . hydrALAZINE (APRESOLINE) 100 MG tablet Take 100 mg by mouth 3 (three) times daily.  . insulin detemir (LEVEMIR) 100 UNIT/ML injection Inject 34 Units into the skin 2 (two) times daily. Morning and bedtime  . insulin lispro (HUMALOG) 100 UNIT/ML injection Inject 18-24 Units into the skin See admin instructions. Inject  18 units SQ with breakfast, inject 24 units SQ with lunch and inject 12 units SQ with dinner  . metoprolol succinate (TOPROL-XL) 100 MG 24 hr tablet Take 300 mg by mouth daily.   . polyethylene glycol (MIRALAX / GLYCOLAX) packet Take 17 g by mouth daily.  [name]. senna (SENOKOT) 8.6 MG TABS tablet Take 2 tablets by mouth at bedtime.  . sertraline (ZOLOFT) 25 MG tablet Take 25 mg by mouth daily. Takes 25 mg and 50 mg daily to equal 75 mg  . sertraline (ZOLOFT) 50 MG tablet Take 50 mg by mouth daily. Takes 50 mg and  25 mg to equal 75 mg daily  . simvastatin (ZOCOR) 10 MG tablet Take 10 mg by mouth at bedtime. Takes 20 mg and 10 mg to equal 30 mg every night  . simvastatin (ZOCOR) 20 MG tablet Take 30 mg by mouth at bedtime. Takes 20 mg and 10 mg to equal 30 mg every night  . torsemide (DEMADEX) 20 MG tablet Take 2 tablets (40 mg total) by mouth daily. (Patient taking differently: Take 100 mg by mouth daily. )  . Vitamin D, Ergocalciferol, (DRISDOL) 50000 UNITS CAPS capsule Take 50,000 Units by mouth every 30 (thirty) days. Pt takes on the 3rd of every month.    Past Medical History:  Diagnosis Date  . Anxiety   . Cellulitis 10/17/2014   left lower leg  . Chronic diastolic CHF (congestive heart failure) (  HCC)   . CKD (chronic kidney disease), stage III (HCC)   . COPD (chronic obstructive pulmonary disease) (HCC)   . Coronary artery disease   . Dementia    Alzheimer's   . Depression   . Diabetes mellitus without complication (HCC)   . GERD (gastroesophageal reflux disease)   . Hyperlipemia   . Hypertension   . Obesity   . Renal disorder     Past Surgical History:  Procedure Laterality Date  . LOWER EXTREMITY ANGIOGRAPHY Left 02/11/2017   Procedure: LOWER EXTREMITY ANGIOGRAPHY;  Surgeon: Renford Dills, MD;  Location: [facility]IVE [facility];  Service: Cardiovascular;  Laterality: Left;  . PERIPHERAL VASCULAR ATHERECTOMY Left 02/18/2017   Procedure: PERIPHERAL VASCULAR ATHERECTOMY;  Surgeon:  Renford Dills, MD;  Location: [facility] CV [facility];  Service: Cardiovascular;  Laterality: Left;    Social History Social History   Tobacco Use  . Smoking status: Never Smoker  . Smokeless tobacco: Former Neurosurgeon    Types: Chew  Substance Use Topics  . Alcohol use: No  . Drug use: No    Family History Family History  Problem Relation Age of Onset  . Cancer Brother     Allergies  Allergen Reactions  . Penicillins Other (See Comments)    Per MAR      REVIEW OF SYSTEMS (Negative unless checked)  Constitutional: [] Weight loss  [] Fever  [] Chills Cardiac: [] Chest pain   [] Chest pressure   [] Palpitations   [] Shortness of breath when laying flat   [] Shortness of breath with exertion. Vascular:  [] Pain in legs with walking   [x] Pain in legs at rest  [] History of DVT   [] Phlebitis   [] Swelling in legs   [] Varicose veins   [x] Non-healing ulcers Pulmonary:   [] Uses home oxygen   [] Productive cough   [] Hemoptysis   [] Wheeze  [] COPD   [] Asthma Neurologic:  [] Dizziness   [] Seizures   [] History of stroke   [] History of TIA  [] Aphasia   [] Vissual changes   [] Weakness or numbness in arm   [] Weakness or numbness in leg Musculoskeletal:   [] Joint swelling   [] Joint pain   [] Low back pain Hematologic:  [] Easy bruising  [] Easy bleeding   [] Hypercoagulable state   [] Anemic Gastrointestinal:  [] Diarrhea   [] Vomiting  [] Gastroesophageal reflux/heartburn   [] Difficulty swallowing. Genitourinary:  [x] Chronic kidney disease   [] Difficult urination  [] Frequent urination   [] Blood in urine Skin:  [x] Rashes   [x] Ulcers  Psychological:  [] History of anxiety   []  History of major depression.  Physical Examination  Vitals:   03/03/17 1559  BP: (!) 156/62  Pulse: 64  Resp: 16  Weight: 264 lb (119.7 kg)  Height: 6\' 5"  (1.956 m)   Body mass index is 31.31 kg/m. Gen: WD/WN, NAD Head: [place]/AT, No temporalis wasting.  Ear/Nose/Throat: Hearing grossly intact, nares w/o erythema or drainage Eyes:  PER, EOMI, sclera nonicteric.  Neck: Supple, no large masses.   Pulmonary:  Good air movement, no audible wheezing bilaterally, no use of accessory muscles.  Cardiac: RRR, no JVD Vascular: 3+ edema of the left leg with moderate venous changes of the left leg.  Large  Ulcer of the left forefoot with gangrene noted. Vessel Right Left  Radial Palpable Palpable  PT Not Palpable Not Palpable  DP Not Palpable Not Palpable  Gastrointestinal: Non-distended. No guarding/no peritoneal signs.  Musculoskeletal: M/S 5/5 throughout.  No deformity or atrophy.  Neurologic: CN 2-12 intact. Symmetrical.  Speech is fluent. Motor exam as listed  above. Psychiatric: Judgment intact, Mood & affect appropriate for pt's clinical situation. Dermatologic: Venous stasis dermatitis with ulcers present on the left.  No changes consistent with cellulitis. Lymph : No lichenification or skin changes of chronic lymphedema.  CBC Lab Results  Component Value Date   WBC 5.6 10/27/2014   HGB 9.2 (L) 10/27/2014   HCT 29.1 (L) 10/27/2014   MCV 88.9 10/27/2014   PLT 91 (L) 10/27/2014    BMET    Component Value Date/Time   NA 137 10/31/2014 0939   NA 133 (L) 07/04/2013 1030   K 4.8 10/31/2014 0939   K 4.3 07/04/2013 1030   CL 102 10/31/2014 0939   CL 98 07/04/2013 1030   CO2 29 10/31/2014 0939   CO2 29 07/04/2013 1030   GLUCOSE 139 (H) 10/31/2014 0939   GLUCOSE 83 07/04/2013 1030   BUN 32 (H) 02/11/2017 0817   BUN 21 (H) 07/04/2013 1030   CREATININE 1.37 (H) 02/11/2017 0817   CREATININE 1.81 (H) 07/04/2013 1030   CALCIUM 8.9 10/31/2014 0939   CALCIUM 8.7 07/04/2013 1030   GFRNONAA 46 (L) 02/11/2017 0817   GFRNONAA 35 (L) 07/04/2013 1030   GFRAA 54 (L) 02/11/2017 0817   GFRAA 40 (L) 07/04/2013 1030   CrCl cannot be calculated (Patient's most recent lab result is older than the maximum 21 days allowed.).  COAG No results found for: INR, PROTIME  Radiology No results found.   Assessment/Plan 1.  Atherosclerosis of native arteries of the extremities with ulceration (HCC)  Recommend:  The patient has evidence of severe atherosclerotic changes of both lower extremities associated with ulceration and tissue loss of the foot.  This represents a limb threatening ischemia and places the patient at the risk for limb loss.  I have reviewed the past two angiograms with the patient and family.  I have verified that the left DP is patent and seems reasonable for access.  My plan will be for general anesthesia and bringing in the ultrasound machine from the lab to help identify the DP.  Patient should undergo angiography of the lower extremities with the hope for intervention for limb salvage.  The risks and benefits as well as the alternative therapies was discussed in detail with the patient.  All questions were answered.  Patient agrees to proceed with angiography.  The patient will follow up with me in the office after the procedure.    2. Coronary artery disease involving native coronary artery of native heart with angina pectoris (HCC) Continue cardiac and antihypertensive medications as already ordered and reviewed, no changes at this time.  Continue statin as ordered and reviewed, no changes at this time  Nitrates PRN for chest pain   3. Essential hypertension Continue antihypertensive medications as already ordered, these medications have been reviewed and there are no changes at this time.   4. Simple chronic bronchitis (HCC) Continue pulmonary medications and aerosols as already ordered, these medications have been reviewed and there are no changes at this time.    Levora Dredge, MD  03/05/2017 9:55 AM

## 2017-03-10 ENCOUNTER — Other Ambulatory Visit (INDEPENDENT_AMBULATORY_CARE_PROVIDER_SITE_OTHER): Payer: Self-pay | Admitting: Vascular Surgery

## 2017-03-10 MED ORDER — CLINDAMYCIN PHOSPHATE 300 MG/50ML IV SOLN
300.0000 mg | Freq: Once | INTRAVENOUS | Status: AC
  Administered 2017-03-11: 300 mg via INTRAVENOUS

## 2017-03-11 ENCOUNTER — Ambulatory Visit
Admission: RE | Admit: 2017-03-11 | Discharge: 2017-03-11 | Disposition: A | Discharge status: Skilled Nursing Facility | Payer: Medicare Other | Source: Ambulatory Visit | Attending: Vascular Surgery | Admitting: Vascular Surgery

## 2017-03-11 ENCOUNTER — Inpatient Hospital Stay: Payer: Medicare Other

## 2017-03-11 ENCOUNTER — Ambulatory Visit: Payer: Medicare Other | Admitting: Certified Registered Nurse Anesthetist

## 2017-03-11 ENCOUNTER — Encounter
Admission: RE | Disposition: A | Discharge status: Skilled Nursing Facility | Payer: Self-pay | Source: Ambulatory Visit | Attending: Vascular Surgery

## 2017-03-11 DIAGNOSIS — E669 Obesity, unspecified: Secondary | ICD-10-CM | POA: Diagnosis not present

## 2017-03-11 DIAGNOSIS — I96 Gangrene, not elsewhere classified: Secondary | ICD-10-CM

## 2017-03-11 DIAGNOSIS — Z880 Allergy status to penicillin: Secondary | ICD-10-CM | POA: Insufficient documentation

## 2017-03-11 DIAGNOSIS — E1152 Type 2 diabetes mellitus with diabetic peripheral angiopathy with gangrene: Secondary | ICD-10-CM | POA: Insufficient documentation

## 2017-03-11 DIAGNOSIS — K219 Gastro-esophageal reflux disease without esophagitis: Secondary | ICD-10-CM | POA: Insufficient documentation

## 2017-03-11 DIAGNOSIS — I25119 Atherosclerotic heart disease of native coronary artery with unspecified angina pectoris: Secondary | ICD-10-CM | POA: Diagnosis not present

## 2017-03-11 DIAGNOSIS — L97909 Non-pressure chronic ulcer of unspecified part of unspecified lower leg with unspecified severity: Secondary | ICD-10-CM

## 2017-03-11 DIAGNOSIS — I70299 Other atherosclerosis of native arteries of extremities, unspecified extremity: Secondary | ICD-10-CM

## 2017-03-11 DIAGNOSIS — N183 Chronic kidney disease, stage 3 (moderate): Secondary | ICD-10-CM | POA: Insufficient documentation

## 2017-03-11 DIAGNOSIS — Z9889 Other specified postprocedural states: Secondary | ICD-10-CM | POA: Diagnosis not present

## 2017-03-11 DIAGNOSIS — I70268 Atherosclerosis of native arteries of extremities with gangrene, other extremity: Secondary | ICD-10-CM | POA: Insufficient documentation

## 2017-03-11 DIAGNOSIS — Z809 Family history of malignant neoplasm, unspecified: Secondary | ICD-10-CM | POA: Diagnosis not present

## 2017-03-11 DIAGNOSIS — Z955 Presence of coronary angioplasty implant and graft: Secondary | ICD-10-CM | POA: Diagnosis not present

## 2017-03-11 DIAGNOSIS — J449 Chronic obstructive pulmonary disease, unspecified: Secondary | ICD-10-CM | POA: Insufficient documentation

## 2017-03-11 DIAGNOSIS — I5032 Chronic diastolic (congestive) heart failure: Secondary | ICD-10-CM | POA: Diagnosis not present

## 2017-03-11 DIAGNOSIS — I130 Hypertensive heart and chronic kidney disease with heart failure and stage 1 through stage 4 chronic kidney disease, or unspecified chronic kidney disease: Secondary | ICD-10-CM | POA: Diagnosis not present

## 2017-03-11 DIAGNOSIS — E11621 Type 2 diabetes mellitus with foot ulcer: Secondary | ICD-10-CM | POA: Diagnosis not present

## 2017-03-11 DIAGNOSIS — Z992 Dependence on renal dialysis: Secondary | ICD-10-CM | POA: Insufficient documentation

## 2017-03-11 DIAGNOSIS — Z87891 Personal history of nicotine dependence: Secondary | ICD-10-CM | POA: Diagnosis not present

## 2017-03-11 DIAGNOSIS — Z6831 Body mass index (BMI) 31.0-31.9, adult: Secondary | ICD-10-CM | POA: Insufficient documentation

## 2017-03-11 DIAGNOSIS — I700 Atherosclerosis of aorta: Secondary | ICD-10-CM

## 2017-03-11 DIAGNOSIS — I70262 Atherosclerosis of native arteries of extremities with gangrene, left leg: Secondary | ICD-10-CM

## 2017-03-11 DIAGNOSIS — L97521 Non-pressure chronic ulcer of other part of left foot limited to breakdown of skin: Secondary | ICD-10-CM | POA: Insufficient documentation

## 2017-03-11 DIAGNOSIS — E1122 Type 2 diabetes mellitus with diabetic chronic kidney disease: Secondary | ICD-10-CM | POA: Insufficient documentation

## 2017-03-11 HISTORY — PX: LOWER EXTREMITY ANGIOGRAPHY: CATH118251

## 2017-03-11 HISTORY — PX: LOWER EXTREMITY INTERVENTION: CATH118252

## 2017-03-11 LAB — GLUCOSE, CAPILLARY
GLUCOSE-CAPILLARY: 90 mg/dL (ref 65–99)
GLUCOSE-CAPILLARY: 98 mg/dL (ref 65–99)

## 2017-03-11 LAB — CREATININE, SERUM
CREATININE: 1.4 mg/dL — AB (ref 0.61–1.24)
GFR calc Af Amer: 52 mL/min — ABNORMAL LOW (ref >60)
GFR, EST NON AFRICAN AMERICAN: 45 mL/min — AB (ref >60)

## 2017-03-11 LAB — BUN: BUN: 34 mg/dL — AB (ref 6–20)

## 2017-03-11 SURGERY — LOWER EXTREMITY ANGIOGRAPHY
Anesthesia: General | Laterality: Left

## 2017-03-11 MED ORDER — GLYCOPYRROLATE 0.2 MG/ML IJ SOLN
INTRAMUSCULAR | Status: DC | PRN
  Administered 2017-03-11: 0.2 mg via INTRAVENOUS

## 2017-03-11 MED ORDER — FENTANYL CITRATE (PF) 100 MCG/2ML IJ SOLN
INTRAMUSCULAR | Status: DC | PRN
  Administered 2017-03-11 (×2): 25 ug via INTRAVENOUS
  Administered 2017-03-11: 50 ug via INTRAVENOUS

## 2017-03-11 MED ORDER — FENTANYL CITRATE (PF) 100 MCG/2ML IJ SOLN: 25.0000 ug | INTRAMUSCULAR | Status: DC | PRN

## 2017-03-11 MED ORDER — FAMOTIDINE 20 MG PO TABS: 40.0000 mg | ORAL_TABLET | ORAL | Status: DC | PRN

## 2017-03-11 MED ORDER — HEPARIN SODIUM (PORCINE) 1000 UNIT/ML IJ SOLN
INTRAMUSCULAR | Status: DC | PRN
  Administered 2017-03-11: 3000 [IU] via INTRAVENOUS
  Administered 2017-03-11: 2000 [IU] via INTRAVENOUS

## 2017-03-11 MED ORDER — EPHEDRINE SULFATE 50 MG/ML IJ SOLN
INTRAMUSCULAR | Status: DC | PRN
  Administered 2017-03-11 (×2): 10 mg via INTRAVENOUS
  Administered 2017-03-11: 15 mg via INTRAVENOUS

## 2017-03-11 MED ORDER — METHYLPREDNISOLONE SODIUM SUCC 125 MG IJ SOLR: 125.0000 mg | INTRAMUSCULAR | Status: DC | PRN

## 2017-03-11 MED ORDER — PROPOFOL 10 MG/ML IV BOLUS
INTRAVENOUS | Status: AC
  Filled 2017-03-11: qty 20

## 2017-03-11 MED ORDER — FENTANYL CITRATE (PF) 100 MCG/2ML IJ SOLN
INTRAMUSCULAR | Status: AC
  Filled 2017-03-11: qty 2

## 2017-03-11 MED ORDER — ONDANSETRON HCL 4 MG/2ML IJ SOLN: 4.0000 mg | Freq: Once | INTRAMUSCULAR | Status: DC | PRN

## 2017-03-11 MED ORDER — LIDOCAINE HCL (CARDIAC) 20 MG/ML IV SOLN
INTRAVENOUS | Status: DC | PRN
  Administered 2017-03-11: 100 mg via INTRAVENOUS

## 2017-03-11 MED ORDER — CLINDAMYCIN PHOSPHATE 300 MG/50ML IV SOLN
INTRAVENOUS | Status: AC
  Filled 2017-03-11: qty 50

## 2017-03-11 MED ORDER — LIDOCAINE HCL (PF) 1 % IJ SOLN
INTRAMUSCULAR | Status: AC
  Filled 2017-03-11: qty 30

## 2017-03-11 MED ORDER — HEPARIN (PORCINE) IN NACL 2-0.9 UNIT/ML-% IJ SOLN
INTRAMUSCULAR | Status: AC
  Filled 2017-03-11: qty 1000

## 2017-03-11 MED ORDER — IOPAMIDOL (ISOVUE-300) INJECTION 61%
INTRAVENOUS | Status: DC | PRN
Start: 2017-03-11 — End: 2017-03-11
  Administered 2017-03-11: 50 mL via INTRAVENOUS

## 2017-03-11 MED ORDER — VASOPRESSIN 20 UNIT/ML IV SOLN
INTRAVENOUS | Status: DC | PRN
  Administered 2017-03-11 (×6): 1 [IU] via INTRAVENOUS

## 2017-03-11 MED ORDER — ONDANSETRON HCL 4 MG/2ML IJ SOLN: 4.0000 mg | Freq: Four times a day (QID) | INTRAMUSCULAR | Status: DC | PRN

## 2017-03-11 MED ORDER — HYDROMORPHONE HCL 1 MG/ML IJ SOLN: 1.0000 mg | Freq: Once | INTRAMUSCULAR | Status: DC | PRN

## 2017-03-11 MED ORDER — MORPHINE SULFATE (PF) 4 MG/ML IV SOLN: 2.0000 mg | INTRAVENOUS | Status: DC | PRN

## 2017-03-11 MED ORDER — SODIUM CHLORIDE 0.9 % IV SOLN
INTRAVENOUS | Status: DC
  Administered 2017-03-11: 09:00:00 via INTRAVENOUS

## 2017-03-11 MED ORDER — PROPOFOL 10 MG/ML IV BOLUS
INTRAVENOUS | Status: DC | PRN
  Administered 2017-03-11: 120 mg via INTRAVENOUS

## 2017-03-11 SURGICAL SUPPLY — 30 items
BALLN STERLING OTW 2X60X150 (BALLOONS) ×4
BALLN UTLRAVERSE 2X60X150 (BALLOONS) ×4
BALLOON STERLING OTW 2X60X150 (BALLOONS) ×2 IMPLANT
BALLOON UTLRAVERSE 2X60X150 (BALLOONS) ×2 IMPLANT
CANNULA 5F STIFF (CANNULA) ×4 IMPLANT
CATH BEACON 5 .035 40 KMP TP (CATHETERS) ×4 IMPLANT
CATH BEACON 5 .035 65 RIM TIP (CATHETERS) ×4 IMPLANT
CATH BEACON 5 .038 40 KMP TP (CATHETERS) ×4
CATH CXI SUPP ANG 2.6FR 150CM (MICROCATHETER) ×4 IMPLANT
CATH GWIRE MARINER STRGHT 4FR (CATHETERS) ×4 IMPLANT
CATH VERT 5FR 125CM (CATHETERS) ×4 IMPLANT
DEVICE PRESTO INFLATION (MISCELLANEOUS) ×8 IMPLANT
DEVICE STARCLOSE SE CLOSURE (Vascular Products) ×4 IMPLANT
DEVICE TORQUE (MISCELLANEOUS) ×4 IMPLANT
DRAPE TABLE BACK 80X90 (DRAPES) ×4 IMPLANT
GLIDECATH 4FR STR (CATHETERS) ×8 IMPLANT
GLIDEWIRE ADV .035X180CM (WIRE) ×4 IMPLANT
GUIDEWIRE LT ZIPWIRE 035X260 (WIRE) ×4 IMPLANT
GUIDEWIRE PFTE-COATED .018X300 (WIRE) ×4 IMPLANT
PACK ANGIOGRAPHY (CUSTOM PROCEDURE TRAY) ×4 IMPLANT
SHEATH BRITE TIP 5FRX11 (SHEATH) ×4 IMPLANT
SHEATH GLIDE SLENDER 4/5FR (SHEATH) ×4 IMPLANT
SHEATH MICROPUNCTURE PEDAL 4FR (SHEATH) ×4 IMPLANT
SHEATH RAABE 6FR (SHEATH) ×4 IMPLANT
SHIELD RADPAD SCOOP 12X17 (MISCELLANEOUS) ×4 IMPLANT
TOWEL OR 17X26 4PK STRL BLUE (TOWEL DISPOSABLE) ×4 IMPLANT
VALVE HEMO TOUHY BORST Y (VALVE) ×4 IMPLANT
WIRE COMMAND ST 018 300CM (WIRE) ×8 IMPLANT
WIRE G 018X200 V18 (WIRE) ×4 IMPLANT
WIRE G V18X300CM (WIRE) ×4 IMPLANT

## 2017-03-11 NOTE — Anesthesia Preprocedure Evaluation (Addendum)
Anesthesia Evaluation  Patient identified by MRN, date of birth, ID band Patient awake    Reviewed: Allergy & Precautions, H&P , NPO status , Patient's Chart, lab work & pertinent test results, reviewed documented beta blocker date and time   Airway Mallampati: III  TM Distance: >3 FB Neck ROM: full    Dental  (+) Poor Dentition, Missing   Pulmonary sleep apnea , COPD,           Cardiovascular Exercise Tolerance: Good hypertension, (-) angina+ CAD, + Peripheral Vascular Disease and +CHF  (-) Past MI, (-) Cardiac Stents and (-) CABG (-) dysrhythmias (-) Valvular Problems/Murmurs     Neuro/Psych neg Seizures PSYCHIATRIC DISORDERS Anxiety Depression Dementia Parkinson's disease    GI/Hepatic Neg liver ROS, GERD  Controlled,  Endo/Other  diabetes  Renal/GU CRFRenal disease  negative genitourinary   Musculoskeletal   Abdominal   Peds  Hematology negative hematology ROS (+)   Anesthesia Other Findings Past Medical History: No date: Anxiety 10/17/2014: Cellulitis     Comment:  left lower leg No date: Chronic diastolic CHF (congestive heart failure) (HCC) No date: CKD (chronic kidney disease), stage III (HCC) No date: COPD (chronic obstructive pulmonary disease) (HCC) No date: Coronary artery disease No date: Dementia     Comment:  Alzheimer's  No date: Depression No date: Diabetes mellitus without complication (HCC) No date: GERD (gastroesophageal reflux disease) No date: Hyperlipemia No date: Hypertension No date: Obesity No date: Renal disorder   Reproductive/Obstetrics negative OB ROS                             Anesthesia Physical Anesthesia Plan  ASA: III  Anesthesia Plan: General   Post-op Pain Management:    Induction: Intravenous  PONV Risk Score and Plan: 2 and Ondansetron and Dexamethasone  Airway Management Planned: LMA  Additional Equipment:   Intra-op Plan:    Post-operative Plan: Extubation in OR  Informed Consent: I have reviewed the patients History and Physical, chart, labs and discussed the procedure including the risks, benefits and alternatives for the proposed anesthesia with the patient or authorized representative who has indicated his/her understanding and acceptance.   Dental Advisory Given  Plan Discussed with: Anesthesiologist, CRNA and Surgeon  Anesthesia Plan Comments:        Anesthesia Quick Evaluation

## 2017-03-11 NOTE — H&P (Signed)
[facility] History & Physical Update  The patient was interviewed and re-examined.  The patient's previous History and Physical has been reviewed and is unchanged.  There is no change in the plan of care. We plan to proceed with the scheduled procedure.  Levora DredgeGregory Vihan Santagata, MD  03/11/2017, 9:34 AM

## 2017-03-11 NOTE — Anesthesia Postprocedure Evaluation (Signed)
Anesthesia Post Note  Patient: Billy Liu  Procedure(s) Performed: LOWER EXTREMITY ANGIOGRAPHY (Left ) LOWER EXTREMITY INTERVENTION  Patient location during evaluation: PACU Anesthesia Type: General Level of consciousness: awake and alert Pain management: pain level controlled Vital Signs Assessment: post-procedure vital signs reviewed and stable Respiratory status: spontaneous breathing, nonlabored ventilation, respiratory function stable and patient connected to nasal cannula oxygen Cardiovascular status: blood pressure returned to baseline and stable Postop Assessment: no apparent nausea or vomiting Anesthetic complications: no     Last Vitals:  Vitals:   03/11/17 1351 03/11/17 1353  BP:  (!) 138/97  Pulse:  62  Resp:  16  Temp:    SpO2: 95% 95%    Last Pain:  Vitals:   03/11/17 1340  TempSrc:   PainSc: Asleep                 Lenard SimmerAndrew Euna Armon

## 2017-03-11 NOTE — Progress Notes (Signed)
[facility], Stanton KidneyDebra. Return appt. Made for f/u with Dr. [name]. DC instructions reviewed with pt. Brother , aide, and nursing home RN. Right groin and top of left foot drsgs. Clean, dry, intact without complications noted at either site. Stable for transport back to nursing home with transport team and aide.

## 2017-03-11 NOTE — Progress Notes (Signed)
Pt. Received from PACU. Right groin starclose site and Left foot starclose site both clean, dry, intact without any hematoma, edema, drainage, ecchymosis. Left foot with 3+ tight edema. Left leg with generalized edema 3+ compared to right leg generalized edema 2-3+ edema. Pt. "mumbles" frequently; disoriented to year, situation. Pt. Aware at "[facility] " and aware of Month. No acute distress on arrival.

## 2017-03-11 NOTE — Anesthesia Post-op Follow-up Note (Signed)
Anesthesia QCDR form completed.        

## 2017-03-11 NOTE — Anesthesia Procedure Notes (Signed)
Procedure Name: LMA Insertion Date/Time: 03/11/2017 10:13 AM Performed by: Irving BurtonBachich, Cathy Ropp, CRNA Pre-anesthesia Checklist: Patient identified, Emergency Drugs available, Suction available and Patient being monitored Patient Re-evaluated:Patient Re-evaluated prior to induction Oxygen Delivery Method: Circle system utilized Preoxygenation: Pre-oxygenation with 100% oxygen Induction Type: IV induction Ventilation: Mask ventilation without difficulty LMA: LMA inserted LMA Size: 4.5 Number of attempts: 1 Placement Confirmation: positive ETCO2 and breath sounds checked- equal and bilateral Tube secured with: Tape Dental Injury: Teeth and Oropharynx as per pre-operative assessment

## 2017-03-11 NOTE — Transfer of Care (Signed)
Immediate Anesthesia Transfer of Care Note  Patient: Billy Liu  Procedure(s) Performed: LOWER EXTREMITY ANGIOGRAPHY (Left ) LOWER EXTREMITY INTERVENTION  Patient Location: PACU  Anesthesia Type:General  Level of Consciousness: awake  Airway & Oxygen Therapy: Patient connected to face mask oxygen  Post-op Assessment: Post -op Vital signs reviewed and stable  Post vital signs: stable  Last Vitals:  Vitals:   03/11/17 0912 03/11/17 1324  BP: (!) 153/62 (!) 134/53  Pulse: (!) 56 (!) 57  Resp: 17 12  Temp: (!) 36.3 C   SpO2: 99% 100%    Last Pain:  Vitals:   03/11/17 0912  TempSrc: Axillary         Complications: No apparent anesthesia complications

## 2017-03-11 NOTE — Op Note (Signed)
OPERATIVE NOTE   PROCEDURE: 1. Left lower extremity angiography third order catheter placement 2. Ultrasound-guided access right common femoral artery for sheath placement 3. Ultrasound-guided access to left dorsalis pedis with placement of catheter into anterior tibial artery  PRE-OPERATIVE DIAGNOSIS: Atherosclerotic occlusive disease bilateral lower extremities with gangrene left forefoot.  POST-OPERATIVE DIAGNOSIS: Same  SURGEON: Renford Dills, M.D.  ANESTHESIA: Conscious sedation was administered under my direct supervision by the interventional radiology RN. IV Versed plus fentanyl were utilized. Continuous ECG, pulse oximetry and blood pressure was monitored throughout the entire procedure.  Conscious sedation was for a total of 164 minutes.  ESTIMATED BLOOD LOSS: Minimal cc  FINDING(S): 1.  Diffuse atherosclerotic changes of the left lower extremity, gangrene of the left forefoot  SPECIMEN(S):  None  INDICATIONS:   Billy Liu is a 84 y.o. y.o. male who presents with signs of sepsis secondary to infection of the right heel ulcer. He has nonpalpable pulses in association with the heel ulcer. The heel ulcer has not been healing in spite of appropriate wound care.  DESCRIPTION: After obtaining full informed written consent, the patient was brought back to the operating room and placed supine upon the operating table.  The patient received IV antibiotics prior to induction.  After obtaining adequate sedation, the patient was prepped and draped in the standard fashion and appropriate time out is called.    Using ultrasound the dorsalis pedis at the level of the ankle was identified this required color flow imaging but once it had been located under grayscale it was noted to be pulsatile.  Micro needle was then inserted under direct ultrasound visualization image was recorded for the permanent record.  Microwire followed by a micro-sheath subsequently a J-wire and a 5 French  thinwall sheath was inserted.  Hand-injection of contrast demonstrated intraluminal positioning within the distal anterior tibial artery.  Attempts at manipulating various wires using both a 4 French glide catheter as well as a 40 cm Kumpe catheter were unsuccessful.  Ultrasound was returned to the field. Under real-time visualization the right is common femoral artery is identified is echolucent and pulsatile indicating patency. Image is recorded for the permanent record. 1% lidocaine is then infiltrated into the soft tissues with ultrasound visualization. Microneedle is then inserted into the anterior wall of the common femoral artery under direct visualization with ultrasound. Microwire followed by micro-sheath.   J-wire followed by 6 French sheath is then inserted without difficulty.  J-wire is then advanced with pigtail catheter up to the level of the aortic bifurcation and hand-injection of contrast demonstrated the aortic flow divider.  Using a Stiff angled Glide Wire wire and the pigtail catheter the aortic bifurcation was crossed and the catheter is advanced down to the external iliac artery. Oblique view of the femoral bifurcation is then obtained by hand injection. The wire is then reintroduced and the catheter is positioned within the SFA. AP projections of the right lower extremity are obtained for distal runoff.  A 6 French Raby sheath was then advanced up and over the aortic bifurcation position with its tip in the proximal SFA.  The glide catheter and vertebral catheter were then advanced under fluoroscopic guidance and the proximal anterior tibial was selected.  Wire manipulations from this direction were then performed again using a variety wires including the stiff angled Glidewire and the command wire as well as a V 18 wire.  Ultimately I did get both wires to cross each other although they appeared to be in  different planes.  2 mm balloon was used to angioplasty the anterior tibial from  above and below as well as simultaneously using kissing technique.  In spite of all these manipulations reentry from above or below to the true lumen was not achieved.  After 30 minutes of fluoroscopy I elected to terminate the procedure.  After review of the images the catheter is removed sheath is pulled back into the  right iliac system J-wire is then advanced and socially and LAO projection of the groin is obtained after review of this image a Star Cose device is deployed without difficulty.  INTERPRETATION: The abdominal aorta is opacified with a bolus injection of contrast. There is diffuse atherosclerotic changes clearly visible even under fluoroscopy without contrast enhancement. However, there are no hemodynamically significant lesions noted within the aorta, bilateral common iliac arteries and bilateral external iliac arteries.  The peroneal is patent with fairly good collaterals across the ankle.  This is also true of the distal popliteal regarding its patency.  Anterior tibial occludes several centimeters from its origin.  It is reconstituted distally as noted in by hand-injection through this sheath.  Multiple hand injections during the procedure demonstrated that the true lumen was not reentered.  At this time given the peroneal is widely patent with fairly good collaterals as well as the multiple attempts at recanalizing the anterior tibial and the complete nonvisualization of the lateral plantar's and posterior tibial artery I would opt to manage his foot conservatively.  His distal anterior tibial, dorsalis pedis is patent but moderately diseased and would not support bypass.  Below-knee amputation but certainly he will given his pattern of flow and this remains a future option.   COMPLICATIONS: Unsuccessful attempted revascularization  CONDITION: Good  Levora DredgeGregory Schnier, M.D. [facility] Office: [phone]   03/11/2017,1:06 PM

## 2017-03-27 ENCOUNTER — Ambulatory Visit (INDEPENDENT_AMBULATORY_CARE_PROVIDER_SITE_OTHER): Payer: Medicare Other | Admitting: Vascular Surgery

## 2017-03-27 ENCOUNTER — Encounter (INDEPENDENT_AMBULATORY_CARE_PROVIDER_SITE_OTHER): Payer: Self-pay | Admitting: Vascular Surgery

## 2017-03-27 VITALS — BP 145/63 | HR 60 | Resp 16 | Wt 267.0 lb

## 2017-03-27 DIAGNOSIS — I872 Venous insufficiency (chronic) (peripheral): Secondary | ICD-10-CM

## 2017-03-27 DIAGNOSIS — N183 Chronic kidney disease, stage 3 (moderate): Secondary | ICD-10-CM

## 2017-03-27 DIAGNOSIS — I25119 Atherosclerotic heart disease of native coronary artery with unspecified angina pectoris: Secondary | ICD-10-CM | POA: Diagnosis not present

## 2017-03-27 DIAGNOSIS — I10 Essential (primary) hypertension: Secondary | ICD-10-CM

## 2017-03-27 DIAGNOSIS — J410 Simple chronic bronchitis: Secondary | ICD-10-CM

## 2017-03-27 DIAGNOSIS — E1122 Type 2 diabetes mellitus with diabetic chronic kidney disease: Secondary | ICD-10-CM

## 2017-03-27 DIAGNOSIS — I7025 Atherosclerosis of native arteries of other extremities with ulceration: Secondary | ICD-10-CM | POA: Diagnosis not present

## 2017-03-27 DIAGNOSIS — Z794 Long term (current) use of insulin: Secondary | ICD-10-CM

## 2017-03-27 NOTE — Progress Notes (Signed)
MRN : [id number]  Billy Liu is a 84 y.o. (08/01/1932) male who presents with chief complaint of No chief complaint on file. [name].  History of Present Illness: The patient returns to the office for followup and review status post angiogram with intervention. The patient notes no change in the lower extremity symptoms. No interval rest pain symptoms. Previous wounds have now healed!  No new ulcers or wounds have occurred since the last visit.  PROCEDURE 03/11/2017: 1. Left lower extremity angiography third order catheter placement 2. Ultrasound-guided access right common femoral artery for sheath placement 3. Ultrasound-guided access to left dorsalis pedis with placement of catheter into anterior tibial artery  Intervention was not successful at recanalizing the anterior tibial artery.  He is now concerned about the amount of swelling of the left leg.  There have been no significant changes to the patient's overall health care.  The patient denies amaurosis fugax or recent TIA symptoms. There are no recent neurological changes noted. The patient denies history of DVT, PE or superficial thrombophlebitis. The patient denies recent episodes of angina or shortness of breath.     No outpatient medications have been marked as taking for the 03/27/17 encounter (Appointment) with Billy Liu, Billy CraverGregory Liu, Liu.    Past Medical History:  Diagnosis Date  . Anxiety   . Cellulitis 10/17/2014   left lower leg  . Chronic diastolic CHF (congestive heart failure) (HCC)   . CKD (chronic kidney disease), stage III (HCC)   . COPD (chronic obstructive pulmonary disease) (HCC)   . Coronary artery disease   . Dementia    Alzheimer's   . Depression   . Diabetes mellitus without complication (HCC)   . GERD (gastroesophageal reflux disease)   . Hyperlipemia   . Hypertension   . Obesity   . Renal disorder     Past Surgical History:  Procedure Laterality Date  . LOWER EXTREMITY ANGIOGRAPHY Left 02/11/2017    Procedure: LOWER EXTREMITY ANGIOGRAPHY;  Surgeon: Billy Liu, Billy Liu, Liu;  Location: [facility];  Service: Cardiovascular;  Laterality: Left;  . LOWER EXTREMITY ANGIOGRAPHY Left 03/11/2017   Procedure: LOWER EXTREMITY ANGIOGRAPHY;  Surgeon: Billy Liu, Billy Liu, Liu;  Location: [facility];  Service: Cardiovascular;  Laterality: Left;  . LOWER EXTREMITY INTERVENTION  03/11/2017   Procedure: LOWER EXTREMITY INTERVENTION;  Surgeon: Billy Liu, Billy Liu, Liu;  Location: [facility];  Service: Cardiovascular;;  . PERIPHERAL VASCULAR ATHERECTOMY Left 02/18/2017   Procedure: PERIPHERAL VASCULAR ATHERECTOMY;  Surgeon: Billy Liu, Billy Liu, Liu;  Location: [facility];  Service: Cardiovascular;  Laterality: Left;    Social History Social History   Tobacco Use  . Smoking status: Never Smoker  . Smokeless tobacco: Former NeurosurgeonUser    Types: Chew  Substance Use Topics  . Alcohol use: No  . Drug use: No    Family History Family History  Problem Relation Age of Onset  . Cancer Brother     Allergies  Allergen Reactions  . Penicillins Other (See Comments)    Per MAR      REVIEW OF SYSTEMS (Negative unless checked)  Constitutional: [] Weight loss  [] Fever  [] Chills Cardiac: [] Chest pain   [] Chest pressure   [] Palpitations   [] Shortness of breath when laying flat   [] Shortness of breath with exertion. Vascular:  [] Pain in legs with walking   [] Pain in legs at rest  [] History of DVT   [] Phlebitis   [x] Swelling in legs   [] Varicose veins   [  x]Non-healing ulcers Pulmonary:   [] Uses home oxygen   [] Productive cough   [] Hemoptysis   [] Wheeze  [] COPD   [] Asthma Neurologic:  [] Dizziness   [] Seizures   [] History of stroke   [] History of TIA  [] Aphasia   [] Vissual changes   [] Weakness or numbness in arm   [] Weakness or numbness in leg Musculoskeletal:   [] Joint swelling   [] Joint pain   [] Low back pain Hematologic:  [] Easy bruising  [] Easy bleeding   [] Hypercoagulable state    [] Anemic Gastrointestinal:  [] Diarrhea   [] Vomiting  [] Gastroesophageal reflux/heartburn   [] Difficulty swallowing. Genitourinary:  [] Chronic kidney disease   [] Difficult urination  [] Frequent urination   [] Blood in urine Skin:  [] Rashes   [] Ulcers  Psychological:  [] History of anxiety   []  History of major depression.  Physical Examination  There were no vitals filed for this visit. There is no height or weight on file to calculate BMI. Gen: WD/WN, NAD presents in a wheelchair Head: [place]/AT, No temporalis wasting.  Ear/Nose/Throat: Hearing grossly intact, nares w/o erythema or drainage Eyes: PER, EOMI, sclera nonicteric.  Neck: Supple, no large masses.   Pulmonary:  Good air movement, no audible wheezing bilaterally, no use of accessory muscles.  Cardiac: RRR, no JVD Vascular: 3+ edema of the left leg with severe venous changes of the left leg.   Vessel Right Left  Radial Palpable Palpable  PT Not Palpable Not Palpable  DP Not Palpable Not Palpable  Gastrointestinal: Non-distended. No guarding/no peritoneal signs.  Musculoskeletal: M/S 5/5 throughout.  No deformity or atrophy.  Neurologic: CN 2-12 intact. Symmetrical.  Speech is fluent. Motor exam as listed above. Psychiatric: Judgment intact, Mood & affect appropriate for pt's clinical situation. Dermatologic: venous rashes or ulcers noted.  No changes consistent with cellulitis. Lymph : No lichenification or skin changes of chronic lymphedema.  CBC Lab Results  Component Value Date   WBC 5.6 10/27/2014   HGB 9.2 (L) 10/27/2014   HCT 29.1 (L) 10/27/2014   MCV 88.9 10/27/2014   PLT 91 (L) 10/27/2014    BMET    Component Value Date/Time   NA 137 10/31/2014 0939   NA 133 (L) 07/04/2013 1030   K 4.8 10/31/2014 0939   K 4.3 07/04/2013 1030   CL 102 10/31/2014 0939   CL 98 07/04/2013 1030   CO2 29 10/31/2014 0939   CO2 29 07/04/2013 1030   GLUCOSE 139 (H) 10/31/2014 0939   GLUCOSE 83 07/04/2013 1030   BUN 34 (H)  03/11/2017 0926   BUN 21 (H) 07/04/2013 1030   CREATININE 1.40 (H) 03/11/2017 0926   CREATININE 1.81 (H) 07/04/2013 1030   CALCIUM 8.9 10/31/2014 0939   CALCIUM 8.7 07/04/2013 1030   GFRNONAA 45 (L) 03/11/2017 0926   GFRNONAA 35 (L) 07/04/2013 1030   GFRAA 52 (L) 03/11/2017 0926   GFRAA 40 (L) 07/04/2013 1030   CrCl cannot be calculated (Unknown ideal weight.).  COAG No results found for: INR, PROTIME  Radiology [place] Intraoperative  Result Date: 03/11/2017 CLINICAL DATA:  Ultrasound was provided for use by the ordering physician, and a technical charge was applied by the performing facility.  No radiologist interpretation/professional services rendered.     Assessment/Plan  1. Atherosclerosis of native arteries of the extremities with ulceration (HCC)  Recommend:  The patient has evidence of atherosclerosis of the lower extremities with claudication.  The patient does not voice lifestyle limiting changes at this point in time.  Noninvasive studies do not suggest clinically significant change.  No invasive studies, angiography or surgery at this time The patient should continue walking and begin a more formal exercise program.  The patient should continue antiplatelet therapy and aggressive treatment of the lipid abnormalities  No changes in the patient's medications at this time  The patient should continue wearing graduated compression socks 10-15 mmHg strength to control the mild edema.    2. Chronic venous insufficiency No surgery or intervention at this point in time.    I have had a long discussion with the patient regarding venous insufficiency and why it  causes symptoms, specifically venous ulceration . I have discussed with the patient the chronic skin changes that accompany venous insufficiency and the long term sequela such as infection and recurring  ulceration.  Patient will be placed in [employer] which will be changed weekly drainage permitting.  In addition,  behavioral modification including several periods of elevation of the lower extremities during the day will be continued. Achieving a position with the ankles at heart level was stressed to the patient  The patient is instructed to begin routine exercise, especially walking on a daily basis  Patient should undergo duplex ultrasound of the venous system to ensure that DVT or reflux is not present.  Following the review of the ultrasound the patient will follow up in one week to reassess the degree of swelling and the control that Unna therapy is offering.   The patient can be assessed for graduated compression stockings or wraps as well as a Lymph Pump once the ulcers are healed.   3. Coronary artery disease involving native coronary artery of native heart with angina pectoris (HCC) Continue cardiac and antihypertensive medications as already ordered and reviewed, no changes at this time.  Continue statin as ordered and reviewed, no changes at this time  Nitrates PRN for chest pain   4. Essential hypertension Continue antihypertensive medications as already ordered, these medications have been reviewed and there are no changes at this time.   5. Simple chronic bronchitis (HCC) Continue pulmonary medications and aerosols as already ordered, these medications have been reviewed and there are no changes at this time.    6. Type 2 diabetes mellitus with stage 3 chronic kidney disease, with long-term current use of insulin (HCC) Continue hypoglycemic medications as already ordered, these medications have been reviewed and there are no changes at this time.  Hgb A1C to be monitored as already arranged by primary service     Levora Dredge, Liu  03/27/2017 1:15 PM

## 2017-03-29 ENCOUNTER — Encounter (INDEPENDENT_AMBULATORY_CARE_PROVIDER_SITE_OTHER): Payer: Self-pay | Admitting: Vascular Surgery

## 2017-04-28 ENCOUNTER — Ambulatory Visit (INDEPENDENT_AMBULATORY_CARE_PROVIDER_SITE_OTHER): Payer: Medicare Other | Admitting: Vascular Surgery

## 2017-04-28 ENCOUNTER — Encounter (INDEPENDENT_AMBULATORY_CARE_PROVIDER_SITE_OTHER): Payer: Self-pay | Admitting: Vascular Surgery

## 2017-04-28 VITALS — BP 153/61 | HR 61 | Resp 14 | Ht 71.0 in | Wt 267.0 lb

## 2017-04-28 DIAGNOSIS — J410 Simple chronic bronchitis: Secondary | ICD-10-CM

## 2017-04-28 DIAGNOSIS — I10 Essential (primary) hypertension: Secondary | ICD-10-CM | POA: Diagnosis not present

## 2017-04-28 DIAGNOSIS — I890 Lymphedema, not elsewhere classified: Secondary | ICD-10-CM

## 2017-04-28 DIAGNOSIS — I872 Venous insufficiency (chronic) (peripheral): Secondary | ICD-10-CM | POA: Diagnosis not present

## 2017-04-28 DIAGNOSIS — I70213 Atherosclerosis of native arteries of extremities with intermittent claudication, bilateral legs: Secondary | ICD-10-CM

## 2017-04-28 DIAGNOSIS — I25119 Atherosclerotic heart disease of native coronary artery with unspecified angina pectoris: Secondary | ICD-10-CM

## 2017-04-28 NOTE — Progress Notes (Signed)
MRN : [id number]  Billy Liu is a 84 y.o. (1932-06-10) male who presents with chief complaint of  Chief Complaint  Patient presents with  . Follow-up    1 month no studies  .  History of Present Illness:  The patient returns to the office for followup and review status post angiogram with intervention. The patient notes no change in the lower extremity symptoms. No interval rest pain symptoms. Previous wounds have now healed!  No new ulcers or wounds have occurred since the last visit.  There have been no significant changes to the patient's overall health care.  The patient denies amaurosis fugax or recent TIA symptoms. There are no recent neurological changes noted. The patient denies history of DVT, PE or superficial thrombophlebitis. The patient denies recent episodes of angina or shortness of breath.     Current Meds  Medication Sig  . acetaminophen (TYLENOL) 500 MG tablet Take 500 mg by mouth 3 (three) times daily.  [name] amLODipine (NORVASC) 10 MG tablet Take 10 mg by mouth daily.  [name] aspirin EC 81 MG tablet Take 81 mg by mouth daily.  . cloNIDine (CATAPRES) 0.2 MG tablet Take 1 tablet (0.2 mg total) by mouth 3 (three) times daily. (Patient taking differently: Take 0.2 mg by mouth 2 (two) times daily. )  . divalproex (DEPAKOTE ER) 250 MG 24 hr tablet Take 250 mg by mouth daily.  . divalproex (DEPAKOTE) 500 MG DR tablet Take 500 mg by mouth at bedtime.   . finasteride (PROSCAR) 5 MG tablet Take 1 tablet (5 mg total) by mouth daily.  [name] glucagon, human recombinant, (GLUCAGEN DIAGNOSTIC) 1 MG injection Inject 1 mg into the muscle once as needed for low blood sugar.  . hydrALAZINE (APRESOLINE) 100 MG tablet Take 100 mg by mouth 3 (three) times daily.  . insulin detemir (LEVEMIR) 100 UNIT/ML injection Inject 34 Units into the skin 2 (two) times daily. Morning and bedtime  . insulin lispro (HUMALOG) 100 UNIT/ML injection Inject 18-24 Units into the skin See admin instructions.  Inject 18 units SQ with breakfast, inject 24 units SQ with lunch and inject 12 units SQ with dinner  . metoprolol succinate (TOPROL-XL) 100 MG 24 hr tablet Take 300 mg by mouth daily.   . polyethylene glycol powder (GLYCOLAX/MIRALAX) powder   . senna (SENOKOT) 8.6 MG TABS tablet Take 2 tablets by mouth at bedtime.  . sertraline (ZOLOFT) 25 MG tablet Take 25 mg by mouth daily. Takes 25 mg and 50 mg daily to equal 75 mg  . sertraline (ZOLOFT) 50 MG tablet Take 50 mg by mouth daily. Takes 50 mg and  25 mg to equal 75 mg daily  . simvastatin (ZOCOR) 10 MG tablet Take 10 mg by mouth at bedtime. Takes 20 mg and 10 mg to equal 30 mg every night  . simvastatin (ZOCOR) 20 MG tablet Take 30 mg by mouth at bedtime. Takes 20 mg and 10 mg to equal 30 mg every night  . torsemide (DEMADEX) 20 MG tablet Take 2 tablets (40 mg total) by mouth daily. (Patient taking differently: Take 100 mg by mouth daily. )  . Vitamin D, Ergocalciferol, (DRISDOL) 50000 UNITS CAPS capsule Take 50,000 Units by mouth every 30 (thirty) days. Pt takes on the 3rd of every month.    Past Medical History:  Diagnosis Date  . Anxiety   . Cellulitis 10/17/2014   left lower leg  . Chronic diastolic CHF (congestive heart failure) (HCC)   . CKD (  chronic kidney disease), stage III (HCC)   . COPD (chronic obstructive pulmonary disease) (HCC)   . Coronary artery disease   . Dementia    Alzheimer's   . Depression   . Diabetes mellitus without complication (HCC)   . GERD (gastroesophageal reflux disease)   . Hyperlipemia   . Hypertension   . Obesity   . Renal disorder     Past Surgical History:  Procedure Laterality Date  . LOWER EXTREMITY ANGIOGRAPHY Left 02/11/2017   Procedure: LOWER EXTREMITY ANGIOGRAPHY;  Surgeon: Renford DillsSchnier, Kahlel Peake G, MD;  Location: [facility];  Service: Cardiovascular;  Laterality: Left;  . LOWER EXTREMITY ANGIOGRAPHY Left 03/11/2017   Procedure: LOWER EXTREMITY ANGIOGRAPHY;  Surgeon: Renford DillsSchnier, Sukhman Martine G,  MD;  Location: [facility];  Service: Cardiovascular;  Laterality: Left;  . LOWER EXTREMITY INTERVENTION  03/11/2017   Procedure: LOWER EXTREMITY INTERVENTION;  Surgeon: Renford DillsSchnier, Kamani Lewter G, MD;  Location: [facility];  Service: Cardiovascular;;  . PERIPHERAL VASCULAR ATHERECTOMY Left 02/18/2017   Procedure: PERIPHERAL VASCULAR ATHERECTOMY;  Surgeon: Renford DillsSchnier, Katheleen Stella G, MD;  Location: [facility];  Service: Cardiovascular;  Laterality: Left;    Social History Social History   Tobacco Use  . Smoking status: Never Smoker  . Smokeless tobacco: Former NeurosurgeonUser    Types: Chew  Substance Use Topics  . Alcohol use: No  . Drug use: No    Family History Family History  Problem Relation Age of Onset  . Cancer Brother     Allergies  Allergen Reactions  . Penicillins Other (See Comments)    Per MAR      REVIEW OF SYSTEMS (Negative unless checked)  Constitutional: [] Weight loss  [] Fever  [] Chills Cardiac: [] Chest pain   [] Chest pressure   [] Palpitations   [] Shortness of breath when laying flat   [] Shortness of breath with exertion. Vascular:  [] Pain in legs with walking   [] Pain in legs at rest  [] History of DVT   [] Phlebitis   [x] Swelling in legs   [] Varicose veins   [] Non-healing ulcers Pulmonary:   [] Uses home oxygen   [] Productive cough   [] Hemoptysis   [] Wheeze  [] COPD   [] Asthma Neurologic:  [] Dizziness   [] Seizures   [] History of stroke   [] History of TIA  [] Aphasia   [] Vissual changes   [] Weakness or numbness in arm   [x] Weakness or numbness in leg Musculoskeletal:   [] Joint swelling   [x] Joint pain   [] Low back pain Hematologic:  [] Easy bruising  [] Easy bleeding   [] Hypercoagulable state   [] Anemic Gastrointestinal:  [] Diarrhea   [] Vomiting  [] Gastroesophageal reflux/heartburn   [] Difficulty swallowing. Genitourinary:  [x] Chronic kidney disease   [] Difficult urination  [] Frequent urination   [] Blood in urine Skin:  [] Rashes   [] Ulcers  Psychological:   [] History of anxiety   []  History of major depression.  Physical Examination  Vitals:   04/28/17 1324  BP: (!) 153/61  Pulse: 61  Resp: 14  Weight: 267 lb (121.1 kg)  Height: 5\' 11"  (1.803 m)   Body mass index is 37.24 kg/m. Gen: WD/WN, NAD; presents in a wheelchair Head: [place]/AT, No temporalis wasting.  Ear/Nose/Throat: Hearing grossly intact, nares w/o erythema or drainage Eyes: PER, EOMI, sclera nonicteric.  Neck: Supple, no large masses.   Pulmonary:  Good air movement, no audible wheezing bilaterally, no use of accessory muscles.  Cardiac: RRR, no JVD Vascular: massive swelling of both legs Vessel Right Left  Radial Palpable Palpable  PT Not Palpable Not  Palpable  DP Not Palpable Not Palpable  Gastrointestinal: Non-distended. No guarding/no peritoneal signs.  Musculoskeletal: M/S 5/5 throughout.  No deformity or atrophy.  Neurologic: CN 2-12 intact. Symmetrical.  Speech is fluent. Motor exam as listed above. Psychiatric: Judgment intact, Mood & affect appropriate for pt's clinical situation. Dermatologic: severe venous rashes no ulcers noted.  No changes consistent with cellulitis. Lymph : No lichenification or skin changes of chronic lymphedema.  CBC Lab Results  Component Value Date   WBC 5.6 10/27/2014   HGB 9.2 (L) 10/27/2014   HCT 29.1 (L) 10/27/2014   MCV 88.9 10/27/2014   PLT 91 (L) 10/27/2014    BMET    Component Value Date/Time   NA 137 10/31/2014 0939   NA 133 (L) 07/04/2013 1030   K 4.8 10/31/2014 0939   K 4.3 07/04/2013 1030   CL 102 10/31/2014 0939   CL 98 07/04/2013 1030   CO2 29 10/31/2014 0939   CO2 29 07/04/2013 1030   GLUCOSE 139 (H) 10/31/2014 0939   GLUCOSE 83 07/04/2013 1030   BUN 34 (H) 03/11/2017 0926   BUN 21 (H) 07/04/2013 1030   CREATININE 1.40 (H) 03/11/2017 0926   CREATININE 1.81 (H) 07/04/2013 1030   CALCIUM 8.9 10/31/2014 0939   CALCIUM 8.7 07/04/2013 1030   GFRNONAA 45 (L) 03/11/2017 0926   GFRNONAA 35 (L) 07/04/2013  1030   GFRAA 52 (L) 03/11/2017 0926   GFRAA 40 (L) 07/04/2013 1030   CrCl cannot be calculated (Patient's most recent lab result is older than the maximum 21 days allowed.).  COAG No results found for: INR, PROTIME  Radiology No results found.   Assessment/Plan  1. Lymphedema Recommend:  No surgery or intervention at this point in time.    I have reviewed my previous discussion with the patient regarding swelling and why it causes symptoms.  Patient will continue wearing graduated compression stockings class 1 (20-30 mmHg) on a daily basis. The patient will  beginning wearing the stockings first thing in the morning and removing them in the evening. The patient is instructed specifically not to sleep in the stockings.    In addition, behavioral modification including several periods of elevation of the lower extremities during the day will be continued.  This was reviewed with the patient during the initial visit.  The patient will also continue routine exercise, especially walking on a daily basis as was discussed during the initial visit.    Despite conservative treatments including graduated compression therapy class 1 and behavioral modification including exercise and elevation the patient  has not obtained adequate control of the lymphedema.  The patient still has stage 3 lymphedema and therefore, I believe that a lymph pump should be added to improve the control of the patient's lymphedema.  Additionally, a lymph pump is warranted because it will reduce the risk of cellulitis and ulceration in the future.  Patient should follow-up in six months    2. Chronic venous insufficiency Recommend:  No surgery or intervention at this point in time.    I have reviewed my previous discussion with the patient regarding swelling and why it causes symptoms.  Patient will continue wearing graduated compression stockings class 1 (20-30 mmHg) on a daily basis. The patient will  beginning  wearing the stockings first thing in the morning and removing them in the evening. The patient is instructed specifically not to sleep in the stockings.    In addition, behavioral modification including several periods of elevation of the  lower extremities during the day will be continued.  This was reviewed with the patient during the initial visit.  The patient will also continue routine exercise, especially walking on a daily basis as was discussed during the initial visit.    Despite conservative treatments including graduated compression therapy class 1 and behavioral modification including exercise and elevation the patient  has not obtained adequate control of the lymphedema.  The patient still has stage 3 lymphedema and therefore, I believe that a lymph pump should be added to improve the control of the patient's lymphedema.  Additionally, a lymph pump is warranted because it will reduce the risk of cellulitis and ulceration in the future.  Patient should follow-up in six months    3. 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  The patient does not voice lifestyle limiting changes at this point in time.  Noninvasive studies do not suggest clinically significant change.  No invasive studies, angiography or surgery at this time The patient should continue walking and begin a more formal exercise program.  The patient should continue antiplatelet therapy and aggressive treatment of the lipid abnormalities  No changes in the patient's medications at this time  The patient should continue wearing graduated compression socks 10-15 mmHg strength to control the mild edema.    4. Coronary artery disease involving native coronary artery of native heart with angina pectoris (HCC) Continue cardiac and antihypertensive medications as already  ordered and reviewed, no changes at this time.  Continue statin as ordered and reviewed, no changes at this time  Nitrates PRN for chest pain   5. Essential hypertension Continue antihypertensive medications as already ordered, these medications have been reviewed and there are no changes at this time.   6. Simple chronic bronchitis (HCC) Continue pulmonary medications and aerosols as already ordered, these medications have been reviewed and there are no changes at this time.     Levora Dredge, MD  04/28/2017 8:43 PM

## 2017-06-17 ENCOUNTER — Emergency Department: Payer: Medicare Other

## 2017-06-17 ENCOUNTER — Inpatient Hospital Stay
Admission: EM | Admit: 2017-06-17 | Discharge: 2017-06-23 | DRG: 377 | Disposition: A | Discharge status: Skilled Nursing Facility | Payer: Medicare Other | Source: Skilled Nursing Facility | Attending: Internal Medicine | Admitting: Internal Medicine

## 2017-06-17 ENCOUNTER — Other Ambulatory Visit: Payer: Self-pay

## 2017-06-17 DIAGNOSIS — F0280 Dementia in other diseases classified elsewhere without behavioral disturbance: Secondary | ICD-10-CM | POA: Diagnosis present

## 2017-06-17 DIAGNOSIS — I2510 Atherosclerotic heart disease of native coronary artery without angina pectoris: Secondary | ICD-10-CM | POA: Diagnosis present

## 2017-06-17 DIAGNOSIS — I5033 Acute on chronic diastolic (congestive) heart failure: Secondary | ICD-10-CM | POA: Diagnosis not present

## 2017-06-17 DIAGNOSIS — R569 Unspecified convulsions: Secondary | ICD-10-CM | POA: Diagnosis present

## 2017-06-17 DIAGNOSIS — T68XXXA Hypothermia, initial encounter: Secondary | ICD-10-CM | POA: Diagnosis present

## 2017-06-17 DIAGNOSIS — G309 Alzheimer's disease, unspecified: Secondary | ICD-10-CM | POA: Diagnosis present

## 2017-06-17 DIAGNOSIS — W19XXXA Unspecified fall, initial encounter: Secondary | ICD-10-CM | POA: Diagnosis present

## 2017-06-17 DIAGNOSIS — K3189 Other diseases of stomach and duodenum: Secondary | ICD-10-CM | POA: Diagnosis not present

## 2017-06-17 DIAGNOSIS — E1122 Type 2 diabetes mellitus with diabetic chronic kidney disease: Secondary | ICD-10-CM | POA: Diagnosis present

## 2017-06-17 DIAGNOSIS — N5089 Other specified disorders of the male genital organs: Secondary | ICD-10-CM | POA: Diagnosis present

## 2017-06-17 DIAGNOSIS — K5791 Diverticulosis of intestine, part unspecified, without perforation or abscess with bleeding: Secondary | ICD-10-CM | POA: Diagnosis present

## 2017-06-17 DIAGNOSIS — D500 Iron deficiency anemia secondary to blood loss (chronic): Secondary | ICD-10-CM | POA: Diagnosis not present

## 2017-06-17 DIAGNOSIS — F419 Anxiety disorder, unspecified: Secondary | ICD-10-CM | POA: Diagnosis present

## 2017-06-17 DIAGNOSIS — J9601 Acute respiratory failure with hypoxia: Secondary | ICD-10-CM | POA: Diagnosis not present

## 2017-06-17 DIAGNOSIS — L8990 Pressure ulcer of unspecified site, unspecified stage: Secondary | ICD-10-CM

## 2017-06-17 DIAGNOSIS — N179 Acute kidney failure, unspecified: Secondary | ICD-10-CM | POA: Diagnosis present

## 2017-06-17 DIAGNOSIS — N183 Chronic kidney disease, stage 3 (moderate): Secondary | ICD-10-CM | POA: Diagnosis present

## 2017-06-17 DIAGNOSIS — F329 Major depressive disorder, single episode, unspecified: Secondary | ICD-10-CM | POA: Diagnosis present

## 2017-06-17 DIAGNOSIS — D62 Acute posthemorrhagic anemia: Secondary | ICD-10-CM | POA: Diagnosis present

## 2017-06-17 DIAGNOSIS — I130 Hypertensive heart and chronic kidney disease with heart failure and stage 1 through stage 4 chronic kidney disease, or unspecified chronic kidney disease: Secondary | ICD-10-CM | POA: Diagnosis present

## 2017-06-17 DIAGNOSIS — Z7982 Long term (current) use of aspirin: Secondary | ICD-10-CM

## 2017-06-17 DIAGNOSIS — K219 Gastro-esophageal reflux disease without esophagitis: Secondary | ICD-10-CM | POA: Diagnosis present

## 2017-06-17 DIAGNOSIS — T17908A Unspecified foreign body in respiratory tract, part unspecified causing other injury, initial encounter: Secondary | ICD-10-CM

## 2017-06-17 DIAGNOSIS — E872 Acidosis, unspecified: Secondary | ICD-10-CM

## 2017-06-17 DIAGNOSIS — G4733 Obstructive sleep apnea (adult) (pediatric): Secondary | ICD-10-CM | POA: Diagnosis present

## 2017-06-17 DIAGNOSIS — Z880 Allergy status to penicillin: Secondary | ICD-10-CM

## 2017-06-17 DIAGNOSIS — R1310 Dysphagia, unspecified: Secondary | ICD-10-CM | POA: Diagnosis present

## 2017-06-17 DIAGNOSIS — D649 Anemia, unspecified: Secondary | ICD-10-CM | POA: Diagnosis present

## 2017-06-17 DIAGNOSIS — E785 Hyperlipidemia, unspecified: Secondary | ICD-10-CM | POA: Diagnosis present

## 2017-06-17 DIAGNOSIS — J449 Chronic obstructive pulmonary disease, unspecified: Secondary | ICD-10-CM | POA: Diagnosis present

## 2017-06-17 DIAGNOSIS — K259 Gastric ulcer, unspecified as acute or chronic, without hemorrhage or perforation: Secondary | ICD-10-CM | POA: Diagnosis present

## 2017-06-17 DIAGNOSIS — R55 Syncope and collapse: Secondary | ICD-10-CM | POA: Diagnosis not present

## 2017-06-17 DIAGNOSIS — D509 Iron deficiency anemia, unspecified: Secondary | ICD-10-CM | POA: Diagnosis not present

## 2017-06-17 DIAGNOSIS — T8089XA Other complications following infusion, transfusion and therapeutic injection, initial encounter: Secondary | ICD-10-CM | POA: Diagnosis not present

## 2017-06-17 DIAGNOSIS — K625 Hemorrhage of anus and rectum: Secondary | ICD-10-CM | POA: Diagnosis not present

## 2017-06-17 DIAGNOSIS — E669 Obesity, unspecified: Secondary | ICD-10-CM | POA: Diagnosis present

## 2017-06-17 DIAGNOSIS — Z6833 Body mass index (BMI) 33.0-33.9, adult: Secondary | ICD-10-CM

## 2017-06-17 DIAGNOSIS — L89899 Pressure ulcer of other site, unspecified stage: Secondary | ICD-10-CM | POA: Diagnosis present

## 2017-06-17 LAB — PROTIME-INR
INR: 1.22
PROTHROMBIN TIME: 15.3 s — AB (ref 11.4–15.2)

## 2017-06-17 LAB — URINALYSIS, COMPLETE (UACMP) WITH MICROSCOPIC
Bilirubin Urine: NEGATIVE
Glucose, UA: NEGATIVE mg/dL
Hgb urine dipstick: NEGATIVE
Ketones, ur: NEGATIVE mg/dL
Nitrite: POSITIVE — AB
PH: 5 (ref 5.0–8.0)
Protein, ur: NEGATIVE mg/dL
SPECIFIC GRAVITY, URINE: 1.009 (ref 1.005–1.030)

## 2017-06-17 LAB — MRSA PCR SCREENING: MRSA by PCR: NEGATIVE

## 2017-06-17 LAB — ABO/RH: ABO/RH(D): AB POS

## 2017-06-17 LAB — HEMOGLOBIN AND HEMATOCRIT, BLOOD
HEMATOCRIT: 17.1 % — AB (ref 40.0–52.0)
HEMATOCRIT: 17 % — AB (ref 40.0–52.0)
HEMOGLOBIN: 5.5 g/dL — AB (ref 13.0–18.0)
Hemoglobin: 5.4 g/dL — ABNORMAL LOW (ref 13.0–18.0)

## 2017-06-17 LAB — COMPREHENSIVE METABOLIC PANEL
ALK PHOS: 70 U/L (ref 38–126)
ALT: 19 U/L (ref 17–63)
AST: 29 U/L (ref 15–41)
Albumin: 3.6 g/dL (ref 3.5–5.0)
Anion gap: 14 (ref 5–15)
BILIRUBIN TOTAL: 0.3 mg/dL (ref 0.3–1.2)
BUN: 71 mg/dL — AB (ref 6–20)
CALCIUM: 8.7 mg/dL — AB (ref 8.9–10.3)
CO2: 23 mmol/L (ref 22–32)
CREATININE: 1.92 mg/dL — AB (ref 0.61–1.24)
Chloride: 95 mmol/L — ABNORMAL LOW (ref 101–111)
GFR, EST AFRICAN AMERICAN: 36 mL/min — AB (ref >60)
GFR, EST NON AFRICAN AMERICAN: 31 mL/min — AB (ref >60)
Glucose, Bld: 198 mg/dL — ABNORMAL HIGH (ref 65–99)
Potassium: 5.1 mmol/L (ref 3.5–5.1)
Sodium: 132 mmol/L — ABNORMAL LOW (ref 135–145)
TOTAL PROTEIN: 7.1 g/dL (ref 6.5–8.1)

## 2017-06-17 LAB — TROPONIN I

## 2017-06-17 LAB — LACTIC ACID, PLASMA
LACTIC ACID, VENOUS: 3.6 mmol/L — AB (ref 0.5–1.9)
Lactic Acid, Venous: 1.2 mmol/L (ref 0.5–1.9)

## 2017-06-17 LAB — PREPARE RBC (CROSSMATCH)

## 2017-06-17 LAB — VALPROIC ACID LEVEL: VALPROIC ACID LVL: 40 ug/mL — AB (ref 50.0–100.0)

## 2017-06-17 MED ORDER — DIVALPROEX SODIUM 500 MG PO DR TAB
500.0000 mg | DELAYED_RELEASE_TABLET | Freq: Every day | ORAL | Status: DC
  Administered 2017-06-18 – 2017-06-22 (×6): 500 mg via ORAL
  Filled 2017-06-17 (×7): qty 1

## 2017-06-17 MED ORDER — SODIUM CHLORIDE 0.9 % IV SOLN
1.0000 g | Freq: Once | INTRAVENOUS | Status: AC
  Administered 2017-06-17: 1 g via INTRAVENOUS
  Filled 2017-06-17: qty 10

## 2017-06-17 MED ORDER — CLONIDINE HCL 0.1 MG PO TABS
0.2000 mg | ORAL_TABLET | Freq: Three times a day (TID) | ORAL | Status: DC
  Administered 2017-06-18 – 2017-06-23 (×16): 0.2 mg via ORAL
  Filled 2017-06-17 (×16): qty 2

## 2017-06-17 MED ORDER — SODIUM CHLORIDE 0.9 % IV SOLN
1.0000 g | INTRAVENOUS | Status: DC
  Administered 2017-06-18 – 2017-06-22 (×4): 1 g via INTRAVENOUS
  Filled 2017-06-17: qty 1
  Filled 2017-06-17: qty 10
  Filled 2017-06-17 (×3): qty 1
  Filled 2017-06-17: qty 10

## 2017-06-17 MED ORDER — SODIUM CHLORIDE 0.9 % IV SOLN
INTRAVENOUS | Status: AC
  Administered 2017-06-17: 19:00:00 via INTRAVENOUS

## 2017-06-17 MED ORDER — PANTOPRAZOLE SODIUM 40 MG PO TBEC
80.0000 mg | DELAYED_RELEASE_TABLET | Freq: Once | ORAL | Status: AC
  Administered 2017-06-17: 80 mg via ORAL
  Filled 2017-06-17: qty 2

## 2017-06-17 MED ORDER — TRAMADOL HCL 50 MG PO TABS
50.0000 mg | ORAL_TABLET | Freq: Four times a day (QID) | ORAL | Status: DC | PRN
  Administered 2017-06-18: 50 mg via ORAL
  Filled 2017-06-17: qty 1

## 2017-06-17 MED ORDER — SENNA 8.6 MG PO TABS
2.0000 | ORAL_TABLET | Freq: Every day | ORAL | Status: DC
  Administered 2017-06-18 – 2017-06-22 (×6): 17.2 mg via ORAL
  Filled 2017-06-17 (×6): qty 2

## 2017-06-17 MED ORDER — SODIUM CHLORIDE 0.9 % IV SOLN
10.0000 mL/h | Freq: Once | INTRAVENOUS | Status: AC
  Administered 2017-06-17: 10 mL/h via INTRAVENOUS

## 2017-06-17 MED ORDER — SERTRALINE HCL 50 MG PO TABS
75.0000 mg | ORAL_TABLET | Freq: Every day | ORAL | Status: DC
  Administered 2017-06-18 – 2017-06-23 (×5): 75 mg via ORAL
  Filled 2017-06-17 (×5): qty 2

## 2017-06-17 MED ORDER — POLYETHYLENE GLYCOL 3350 17 G PO PACK
17.0000 g | PACK | ORAL | Status: DC
  Administered 2017-06-18 – 2017-06-23 (×6): 17 g via ORAL
  Filled 2017-06-17 (×6): qty 1

## 2017-06-17 MED ORDER — DIVALPROEX SODIUM ER 250 MG PO TB24
250.0000 mg | ORAL_TABLET | ORAL | Status: DC
  Administered 2017-06-18 – 2017-06-23 (×6): 250 mg via ORAL
  Filled 2017-06-17 (×6): qty 1

## 2017-06-17 MED ORDER — HYDRALAZINE HCL 50 MG PO TABS
100.0000 mg | ORAL_TABLET | Freq: Three times a day (TID) | ORAL | Status: DC
  Administered 2017-06-18 – 2017-06-23 (×16): 100 mg via ORAL
  Filled 2017-06-17 (×16): qty 2

## 2017-06-17 MED ORDER — ACETAMINOPHEN 500 MG PO TABS
500.0000 mg | ORAL_TABLET | Freq: Three times a day (TID) | ORAL | Status: DC
  Administered 2017-06-18 (×2): 500 mg via ORAL
  Filled 2017-06-17 (×2): qty 1

## 2017-06-17 MED ORDER — FINASTERIDE 5 MG PO TABS
5.0000 mg | ORAL_TABLET | Freq: Every day | ORAL | Status: DC
  Administered 2017-06-18 – 2017-06-23 (×5): 5 mg via ORAL
  Filled 2017-06-17 (×5): qty 1

## 2017-06-17 MED ORDER — PANTOPRAZOLE SODIUM 40 MG IV SOLR
40.0000 mg | Freq: Two times a day (BID) | INTRAVENOUS | Status: DC
  Administered 2017-06-18 – 2017-06-23 (×11): 40 mg via INTRAVENOUS
  Filled 2017-06-17 (×11): qty 40

## 2017-06-17 NOTE — Progress Notes (Signed)
Family Meeting Note  Advance Directive:yes  Today a meeting took place with the Patient, niece and nephew at bedside  The following clinical team members were present during this meeting:MD  The following were discussed:Patient's diagnosis: Symptomatic anemia, acute kidney injury, syncope, right lower extremity wound and decubitus ulcer, other comorbidities as documented below and treatment plan of care discussed in detail with the patient family members, they verbalized understanding of the plan    Comorbidities coronary artery disease, COPD, type 2 diabetes metas, hypertension, GERD, chronic kidney disease stage III  patient's progosis: Unable to determine and Goals for treatment: Full Code  Billy Liu and Billy LowensteinUrsula nephew and niece at the healthcare POA-   Additional follow-up to be provided: Hospitalist and gastroenterology  Time spent during discussion:18 min  Ramonita LabAruna Nanci Lakatos, MD

## 2017-06-17 NOTE — ED Provider Notes (Addendum)
[facility] Emergency Department Provider Note  ____________________________________________   First MD Initiated Contact with Patient 06/17/17 1529     (approximate)  I have reviewed the triage vital signs and the nursing notes.   HISTORY  Chief Complaint Loss of Consciousness; Fall; and Rectal Bleeding   HPI Billy Liu is a 84 y.o. male who comes to the emergency department by EMS after having a syncopal episode while on the toilet today at his assisted living facility.  He said he is straining to defecate and subsequently passed out fell to the ground.  He has no history of syncope.  He does have a history of seizures as well as dementia.  He reports compliance with all of his medications.  No history of GI bleeding.  He denies chest pain shortness of breath abdominal pain nausea vomiting.  His symptoms came on suddenly went away quickly on their own.  No report of "shaking" or other seizure-like activity.  His symptoms are nonradiating.  Nothing in particular seems to make them better although they are clearly worsened with defecation.  Past Medical History:  Diagnosis Date  . Anxiety   . Cellulitis 10/17/2014   left lower leg  . Chronic diastolic CHF (congestive heart failure) (HCC)   . CKD (chronic kidney disease), stage III (HCC)   . COPD (chronic obstructive pulmonary disease) (HCC)   . Coronary artery disease   . Dementia    Alzheimer's   . Depression   . Diabetes mellitus without complication (HCC)   . GERD (gastroesophageal reflux disease)   . Hyperlipemia   . Hypertension   . Obesity   . Renal disorder     Patient Active Problem List   Diagnosis Date Noted  . Symptomatic anemia 06/17/2017  . Pressure injury of skin 06/17/2017  . Atherosclerosis of native arteries of extremity with intermittent claudication (HCC) 12/25/2016  . Chronic venous insufficiency 12/25/2016  . Lymphedema 10/23/2016  . Hyperlipidemia 10/23/2016  .  Chronic diastolic heart failure (HCC) 11/10/2014  . Obstructive sleep apnea 11/10/2014  . CAD (coronary artery disease) 10/11/2014  . COPD (chronic obstructive pulmonary disease) (HCC) 10/11/2014  . Type 2 diabetes mellitus (HCC) 10/11/2014  . HTN (hypertension) 10/11/2014  . GERD (gastroesophageal reflux disease) 10/11/2014  . CKD (chronic kidney disease), stage III (HCC) 10/11/2014    Past Surgical History:  Procedure Laterality Date  . LOWER EXTREMITY ANGIOGRAPHY Left 02/11/2017   Procedure: LOWER EXTREMITY ANGIOGRAPHY;  Surgeon: Renford Dills, MD;  Location: [facility] INVASIVE CV [facility];  Service: Cardiovascular;  Laterality: Left;  . LOWER EXTREMITY ANGIOGRAPHY Left 03/11/2017   Procedure: LOWER EXTREMITY ANGIOGRAPHY;  Surgeon: Renford Dills, MD;  Location: [facility] INVASIVE CV [facility];  Service: Cardiovascular;  Laterality: Left;  . LOWER EXTREMITY INTERVENTION  03/11/2017   Procedure: LOWER EXTREMITY INTERVENTION;  Surgeon: Renford Dills, MD;  Location: [facility] INVASIVE CV [facility];  Service: Cardiovascular;;  . PERIPHERAL VASCULAR ATHERECTOMY Left 02/18/2017   Procedure: PERIPHERAL VASCULAR ATHERECTOMY;  Surgeon: Renford Dills, MD;  Location: [facility] INVASIVE CV [facility];  Service: Cardiovascular;  Laterality: Left;    Prior to Admission medications   Medication Sig Start Date End Date Taking? Authorizing Provider  acetaminophen (TYLENOL) 500 MG tablet Take 500 mg by mouth 3 (three) times daily.   Yes [provider]  amLODipine (NORVASC) 10 MG tablet Take 10 mg by mouth daily.   Yes [provider]  aspirin EC 81 MG tablet Take 81 mg by mouth daily.  Yes [provider]  cloNIDine (CATAPRES) 0.2 MG tablet Take 1 tablet (0.2 mg total) by mouth 3 (three) times daily. Patient taking differently: Take 0.2 mg by mouth 2 (two) times daily.  10/31/14  Yes Houston Siren, MD  divalproex (DEPAKOTE ER) 250 MG 24 hr tablet Take 250 mg by mouth every morning.    Yes [provider]  divalproex (DEPAKOTE) 500 MG DR tablet Take 500 mg by mouth at bedtime.    Yes [provider]  finasteride (PROSCAR) 5 MG tablet Take 1 tablet (5 mg total) by mouth daily. 10/31/14  Yes Sainani, Rolly Pancake, MD  glucagon, human recombinant, (GLUCAGEN DIAGNOSTIC) 1 MG injection Inject 1 mg into the muscle once as needed for low blood sugar.   Yes [provider]  hydrALAZINE (APRESOLINE) 100 MG tablet Take 100 mg by mouth 3 (three) times daily.   Yes [provider]  insulin detemir (LEVEMIR) 100 UNIT/ML injection Inject 34 Units into the skin 2 (two) times daily.    Yes [provider]  insulin lispro (HUMALOG) 100 UNIT/ML injection Inject 18 units under the skin with breakfast and 24 units with lunch   Yes [provider]  metoprolol succinate (TOPROL-XL) 100 MG 24 hr tablet Take 300 mg by mouth daily.    Yes [provider]  polyethylene glycol (MIRALAX / GLYCOLAX) packet Take 17 g by mouth every morning.  03/14/17  Yes [provider]  senna (SENOKOT) 8.6 MG TABS tablet Take 2 tablets by mouth at bedtime.   Yes [provider]  sertraline (ZOLOFT) 25 MG tablet Take 75 mg by mouth daily.    Yes [provider]  simvastatin (ZOCOR) 20 MG tablet Take 30 mg by mouth at bedtime.    Yes [provider]  torsemide (DEMADEX) 20 MG tablet Take 2 tablets (40 mg total) by mouth daily. Patient taking differently: Take 100 mg by mouth daily.  10/31/14  Yes Sainani, Rolly Pancake, MD  Vitamin D, Ergocalciferol, (DRISDOL) 50000 UNITS CAPS capsule Take 50,000 Units by mouth every 30 (thirty) days.    Yes [provider]    Allergies Penicillins  Family History  Problem Relation Age of Onset  . Cancer Brother     Social History Social History   Tobacco Use  . Smoking status: Never Smoker  . Smokeless tobacco: Former Neurosurgeon    Types: Chew  Substance Use Topics  . Alcohol use: No  . Drug use: No     Review of Systems Constitutional: No fever/chills Eyes: No visual changes. ENT: No sore throat. Cardiovascular: Denies chest pain. Respiratory: Denies shortness of breath. Gastrointestinal: No abdominal pain.  Negative for nausea, no vomiting.  No diarrhea.  No constipation. Genitourinary: Negative for dysuria. Musculoskeletal: Negative for back pain. Skin: Negative for rash. Neurological: Negative for headaches, focal weakness or numbness.   ____________________________________________   PHYSICAL EXAM:  VITAL SIGNS: ED Triage Vitals  Enc Vitals Group     BP      Pulse      Resp      Temp      Temp src      SpO2      Weight      Height      Head Circumference      Peak Flow      Pain Score      Pain Loc      Pain Edu?      Excl. in GC?  Constitutional: Pleasant cooperative no acute distress Eyes: PERRL EOMI. Head: Atraumatic. Nose: No congestion/rhinnorhea. Mouth/Throat: No trismus Neck: No stridor.   Cardiovascular: Normal rate, regular rhythm. Grossly normal heart sounds.  Good peripheral circulation. Respiratory: Normal respiratory effort.  No retractions. Lungs CTAB and moving good air Gastrointestinal: Soft nontender.  Rectal exam performed guaiac positive control positive dark brown stool Musculoskeletal: No lower extremity edema   Neurologic:  Normal speech and language. No gross focal neurologic deficits are appreciated. Skin:  Skin is warm, dry and intact. No rash noted. Psychiatric: Mood and affect are normal. Speech and behavior are normal.    ____________________________________________   DIFFERENTIAL includes but not limited to  Cardiogenic syncope, vasovagal syncope, dehydration, anemia ____________________________________________   LABS (all labs ordered are listed, but only abnormal results are displayed)  Labs Reviewed  URINALYSIS, COMPLETE (UACMP) WITH MICROSCOPIC - Abnormal; Notable for the following components:      Result  Value   Color, Urine YELLOW (*)    APPearance CLEAR (*)    Nitrite POSITIVE (*)    Leukocytes, UA SMALL (*)    Bacteria, UA MANY (*)    All other components within normal limits  COMPREHENSIVE METABOLIC PANEL - Abnormal; Notable for the following components:   Sodium 132 (*)    Chloride 95 (*)    Glucose, Bld 198 (*)    BUN 71 (*)    Creatinine, Ser 1.92 (*)    Calcium 8.7 (*)    GFR calc non Af Amer 31 (*)    GFR calc Af Amer 36 (*)    All other components within normal limits  CBC WITH DIFFERENTIAL/PLATELET - Abnormal; Notable for the following components:   RBC 2.06 (*)    Hemoglobin 5.5 (*)    HCT 17.0 (*)    RDW 17.2 (*)    nRBC 1 (*)    Lymphs Abs 0.4 (*)    Basophils Absolute 0.2 (*)    All other components within normal limits  PROTIME-INR - Abnormal; Notable for the following components:   Prothrombin Time 15.3 (*)    All other components within normal limits  LACTIC ACID, PLASMA - Abnormal; Notable for the following components:   Lactic Acid, Venous 3.6 (*)    All other components within normal limits  VALPROIC ACID LEVEL - Abnormal; Notable for the following components:   Valproic Acid Lvl 40 (*)    All other components within normal limits  HEMOGLOBIN AND HEMATOCRIT, BLOOD - Abnormal; Notable for the following components:   Hemoglobin 5.4 (*)    HCT 17.1 (*)    All other components within normal limits  HEMOGLOBIN AND HEMATOCRIT, BLOOD - Abnormal; Notable for the following components:   Hemoglobin 5.5 (*)    HCT 17.0 (*)    All other components within normal limits  COMPREHENSIVE METABOLIC PANEL - Abnormal; Notable for the following components:   Sodium 133 (*)    BUN 63 (*)    Creatinine, Ser 1.48 (*)    Calcium 8.2 (*)    Albumin 3.4 (*)    GFR calc non Af Amer 42 (*)    GFR calc Af Amer 49 (*)    All other components within normal limits  CBC - Abnormal; Notable for the following components:   RBC 2.53 (*)    Hemoglobin 7.0 (*)    HCT 21.3 (*)     RDW 18.3 (*)    All other components within normal limits  FERRITIN - Abnormal; Notable  for the following components:   Ferritin 19 (*)    All other components within normal limits  IRON AND TIBC - Abnormal; Notable for the following components:   Iron 30 (*)    Saturation Ratios 9 (*)    All other components within normal limits  VITAMIN B12 - Abnormal; Notable for the following components:   Vitamin B-12 943 (*)    All other components within normal limits  HEMOGLOBIN A1C - Abnormal; Notable for the following components:   Hgb A1c MFr Bld 6.6 (*)    All other components within normal limits  GLUCOSE, CAPILLARY - Abnormal; Notable for the following components:   Glucose-Capillary 133 (*)    All other components within normal limits  HEMOGLOBIN - Abnormal; Notable for the following components:   Hemoglobin 6.8 (*)    All other components within normal limits  GLUCOSE, CAPILLARY - Abnormal; Notable for the following components:   Glucose-Capillary 179 (*)    All other components within normal limits  GLUCOSE, CAPILLARY - Abnormal; Notable for the following components:   Glucose-Capillary 177 (*)    All other components within normal limits  CULTURE, BLOOD (ROUTINE X 2)  CULTURE, BLOOD (ROUTINE X 2)  AEROBIC/ANAEROBIC CULTURE (SURGICAL/DEEP WOUND)  MRSA PCR SCREENING  URINE CULTURE  AEROBIC CULTURE (SUPERFICIAL SPECIMEN)  TROPONIN I  LACTIC ACID, PLASMA  PATHOLOGIST SMEAR REVIEW  FOLATE  TYPE AND SCREEN  PREPARE RBC (CROSSMATCH)  ABO/RH    Work reviewed by me with a hemoglobin of 5.5 raising concern for GI bleed __________________________________________  EKG   ____________________________________________  RADIOLOGY  X-ray reviewed by me with no acute disease Head CT reviewed by me with no acute disease ____________________________________________   PROCEDURES  Procedure(s) performed: no  .Critical Care Performed by: Merrily Brittle, MD Authorized by:  Merrily Brittle, MD   Critical care provider statement:    Critical care time (minutes):  30   Critical care time was exclusive of:  Separately billable procedures and treating other patients   Critical care was necessary to treat or prevent imminent or life-threatening deterioration of the following conditions:  Circulatory failure   Critical care was time spent personally by me on the following activities:  Development of treatment plan with patient or surrogate, discussions with consultants, evaluation of patient's response to treatment, examination of patient, obtaining history from patient or surrogate, ordering and performing treatments and interventions, ordering and review of laboratory studies, ordering and review of radiographic studies, pulse oximetry, re-evaluation of patient's condition and review of old charts    Critical Care performed: yes  Observation: no ____________________________________________   INITIAL IMPRESSION / ASSESSMENT AND PLAN / ED COURSE  Pertinent labs & imaging results that were available during my care of the patient were reviewed by me and considered in my medical decision making (see chart for details).      ----------------------------------------- 3:46 PM on 06/17/2017 -----------------------------------------  I reviewed multiple notes going back a year and I am unable to find any note indicating the patient's baseline mental status.  Adding on a head CT now.  ----------------------------------------- 5:11 PM on 06/17/2017 -----------------------------------------  The patient's lab work is back with a number of abnormalities.  He has a lactic acidosis of 3.6 but more concerning a hemoglobin of 5.5.  I performed a digital rectal exam which is dark brown stool but extremely guaiac positive.  I am adding on Protonix and he will require inpatient admission for blood transfusion for symptomatic anemia.  I discussed  with the patient and family who  verbalized understanding agree with the plan.  I then discussed with the hospitalist who has graciously agreed to admit the patient to their service.  His lactic acidosis is likely secondary to under resuscitation however I do think is reasonable to draw cultures and give him a dose of ceftriaxone to cover possible occult sepsis.   FINAL CLINICAL IMPRESSION(S) / ED DIAGNOSES  Final diagnoses:  Syncope, unspecified syncope type  Hypothermia, initial encounter  Lactic acidosis  Symptomatic anemia      NEW MEDICATIONS STARTED DURING THIS VISIT:  Current Discharge Medication List       Note:  This document was prepared using Dragon voice recognition software and may include unintentional dictation errors.     Merrily Brittle, MD 06/19/17 1914    Merrily Brittle, MD 06/27/17 [phone]

## 2017-06-17 NOTE — ED Notes (Signed)
RN and tech cleaned pt and appiled fresh linens. Sacral wound noted. +4 edema to scrotum.U bag appiled to obtain urine sample due to large amount of scrotal swelling. Pt reports bilat leg pain.

## 2017-06-17 NOTE — ED Triage Notes (Addendum)
[facility] for sync[facility] episode while on the toilet today per staff from his assisted living. Staff helped him to the ground and gave 2 SL nitro. Hx of seizures and dementia. Pt able to follow commands and denies any pain currently.

## 2017-06-17 NOTE — ED Notes (Signed)
Report called to receiving RN on floor and pt/family updated on plan of care. VSS.

## 2017-06-17 NOTE — H&P (Signed)
[facility] [place] [facility]   PATIENT NAME: Billy Liu    MR#:  [id number]  DATE OF BIRTH:  [birth year]  DATE OF ADMISSION:  06/17/2017  PRIMARY CARE PHYSICIAN: Keane Police, MD   REQUESTING/REFERRING PHYSICIAN: Merrily Brittle, MD  CHIEF COMPLAINT:  Fall and rectal bleeding  HISTORY OF PRESENT ILLNESS:  Billy Liu  is a 84 y.o. male with a known history of angina, chronic kidney disease stage III, chronic diastolic congestive heart failure, hypertension, diabetes mellitus and multiple other medical problems has sustained a fall at the [facility] nursing home and also they have noticed some rectal bleeding and patient is sent over to the hospital.  Patient's hemoglobin is at 5.5 which was at 9 recently.  Fecal occult blood is positive in the emergency department also lactic acid is elevated.  Patient is a poor historian given the dementia.  Has chronic scrotal edema Patient's niece and nephew healthcare power of attorney are at bedside.  PAST MEDICAL HISTORY:   Past Medical History:  Diagnosis Date  . Anxiety   . Cellulitis 10/17/2014   left lower leg  . Chronic diastolic CHF (congestive heart failure) (HCC)   . CKD (chronic kidney disease), stage III (HCC)   . COPD (chronic obstructive pulmonary disease) (HCC)   . Coronary artery disease   . Dementia    Alzheimer's   . Depression   . Diabetes mellitus without complication (HCC)   . GERD (gastroesophageal reflux disease)   . Hyperlipemia   . Hypertension   . Obesity   . Renal disorder     PAST SURGICAL HISTOIRY:   Past Surgical History:  Procedure Laterality Date  . LOWER EXTREMITY ANGIOGRAPHY Left 02/11/2017   Procedure: LOWER EXTREMITY ANGIOGRAPHY;  Surgeon: Renford Dills, MD;  Location: [facility] INVASIVE CV LAB;  Service: Cardiovascular;  Laterality: Left;  . LOWER EXTREMITY ANGIOGRAPHY Left 03/11/2017   Procedure: LOWER EXTREMITY ANGIOGRAPHY;  Surgeon:  Renford Dills, MD;  Location: [facility] INVASIVE CV LAB;  Service: Cardiovascular;  Laterality: Left;  . LOWER EXTREMITY INTERVENTION  03/11/2017   Procedure: LOWER EXTREMITY INTERVENTION;  Surgeon: Renford Dills, MD;  Location: [facility] INVASIVE CV LAB;  Service: Cardiovascular;;  . PERIPHERAL VASCULAR ATHERECTOMY Left 02/18/2017   Procedure: PERIPHERAL VASCULAR ATHERECTOMY;  Surgeon: Renford Dills, MD;  Location: [facility] INVASIVE CV LAB;  Service: Cardiovascular;  Laterality: Left;    SOCIAL HISTORY:   Social History   Tobacco Use  . Smoking status: Never Smoker  . Smokeless tobacco: Former Neurosurgeon    Types: Chew  Substance Use Topics  . Alcohol use: No    FAMILY HISTORY:   Family History  Problem Relation Age of Onset  . Cancer Brother     DRUG ALLERGIES:   Allergies  Allergen Reactions  . Penicillins Other (See Comments)    Per MAR     REVIEW OF SYSTEMS:  Review of system unobtainable from the patient's chronic dementia  MEDICATIONS AT HOME:   Prior to Admission medications   Medication Sig Start Date End Date Taking? Authorizing Provider  acetaminophen (TYLENOL) 500 MG tablet Take 500 mg by mouth 3 (three) times daily.   Yes [provider]  amLODipine (NORVASC) 10 MG tablet Take 10 mg by mouth daily.   Yes [provider]  aspirin EC 81 MG tablet Take 81 mg by mouth daily.   Yes [provider]  cloNIDine (CATAPRES) 0.2 MG tablet Take 1 tablet (0.2 mg total)  by mouth 3 (three) times daily. Patient taking differently: Take 0.2 mg by mouth 2 (two) times daily.  10/31/14  Yes Houston Siren, MD  divalproex (DEPAKOTE ER) 250 MG 24 hr tablet Take 250 mg by mouth every morning.    Yes [provider]  divalproex (DEPAKOTE) 500 MG DR tablet Take 500 mg by mouth at bedtime.    Yes [provider]  finasteride (PROSCAR) 5 MG tablet Take 1 tablet (5 mg total) by mouth daily. 10/31/14  Yes Sainani, Rolly Pancake, MD  glucagon, human  recombinant, (GLUCAGEN DIAGNOSTIC) 1 MG injection Inject 1 mg into the muscle once as needed for low blood sugar.   Yes [provider]  hydrALAZINE (APRESOLINE) 100 MG tablet Take 100 mg by mouth 3 (three) times daily.   Yes [provider]  insulin detemir (LEVEMIR) 100 UNIT/ML injection Inject 34 Units into the skin 2 (two) times daily.    Yes [provider]  insulin lispro (HUMALOG) 100 UNIT/ML injection Inject 18 units under the skin with breakfast and 24 units with lunch   Yes [provider]  metoprolol succinate (TOPROL-XL) 100 MG 24 hr tablet Take 300 mg by mouth daily.    Yes [provider]  polyethylene glycol (MIRALAX / GLYCOLAX) packet Take 17 g by mouth every morning.  03/14/17  Yes [provider]  senna (SENOKOT) 8.6 MG TABS tablet Take 2 tablets by mouth at bedtime.   Yes [provider]  sertraline (ZOLOFT) 25 MG tablet Take 75 mg by mouth daily.    Yes [provider]  simvastatin (ZOCOR) 20 MG tablet Take 30 mg by mouth at bedtime.    Yes [provider]  torsemide (DEMADEX) 20 MG tablet Take 2 tablets (40 mg total) by mouth daily. Patient taking differently: Take 100 mg by mouth daily.  10/31/14  Yes Sainani, Rolly Pancake, MD  Vitamin D, Ergocalciferol, (DRISDOL) 50000 UNITS CAPS capsule Take 50,000 Units by mouth every 30 (thirty) days.    Yes [provider]      VITAL SIGNS:  Blood pressure 134/66, pulse (!) 59, temperature (!) 96.8 F (36 C), temperature source Rectal, resp. rate 18, height 6' (1.829 m), weight 117.9 kg (260 lb), SpO2 100 %.  PHYSICAL EXAMINATION:  GENERAL:  84 y.o.-year-old patient lying in the bed with no acute distress.  EYES: Pupils equal, round, reactive to light and accommodation. No scleral icterus. Extraocular muscles intact.  HEENT: Head atraumatic, normocephalic. Oropharynx and nasopharynx clear.  NECK:  Supple, no jugular venous distention. No thyroid  enlargement, no tenderness.  LUNGS: Normal breath sounds bilaterally, no wheezing, rales,rhonchi or crepitation. No use of accessory muscles of respiration.  CARDIOVASCULAR: S1, S2 normal. No murmurs, rubs, or gallops.  ABDOMEN: Soft, nontender, nondistended. Bowel sounds present. No organomegaly or mass.  EXTREMITIES: Chronic lower extremity edema and right lower extremity with superficial venous ulcer  NEUROLOGIC: Awake and alert and oriented x1 PSYCHIATRIC: The patient is alert and oriented x 1  SKIN: Decubitus ulcer in the sacral area    LABORATORY PANEL:   CBC Recent Labs  Lab 06/17/17 1532 06/17/17 1721  WBC 6.0  --   HGB 5.5* 5.4*  HCT 17.0* 17.1*  PLT 270  --    ------------------------------------------------------------------------------------------------------------------  Chemistries  Recent Labs  Lab 06/17/17 1532  NA 132*  K 5.1  CL 95*  CO2 23  GLUCOSE 198*  BUN 71*  CREATININE 1.92*  CALCIUM 8.7*  AST 29  ALT 19  ALKPHOS 70  BILITOT 0.3   ------------------------------------------------------------------------------------------------------------------  Cardiac Enzymes Recent Labs  Lab 06/17/17 1532  TROPONINI <0.03   ------------------------------------------------------------------------------------------------------------------  RADIOLOGY:  Ct Head Wo Contrast  Result Date: 06/17/2017 CLINICAL DATA:  Altered level of consciousness. EXAM: CT HEAD WITHOUT CONTRAST TECHNIQUE: Contiguous axial images were obtained from the base of the skull through the vertex without intravenous contrast. COMPARISON:  11/07/2009 FINDINGS: Brain: No evidence of acute infarction, hemorrhage, extra-axial collection, ventriculomegaly, or mass effect. Old area of encephalomalacia in the left parietal lobe. Generalized cerebral atrophy. Periventricular white matter low attenuation likely secondary to microangiopathy. Vascular: Cerebrovascular atherosclerotic calcifications  are noted. Skull: Left frontoparietal craniotomy. Sinuses/Orbits: Visualized portions of the orbits are unremarkable. Visualized portions of the paranasal sinuses and mastoid air cells are unremarkable. Other: None. IMPRESSION: No acute intracranial pathology. Electronically Signed   By: Elige KoHetal  Patel   On: 06/17/2017 16:52   Dg Chest Port 1 View  Result Date: 06/17/2017 CLINICAL DATA:  Syncopal episode while on the toilet today, helped to the ground, history of seizures, Alzheimer dementia, chronic diastolic CHF, diabetes mellitus, stage III chronic kidney disease EXAM: PORTABLE CHEST 1 VIEW COMPARISON:  Portable exam 1555 hours compared to 10/27/2014 FINDINGS: Enlargement of cardiac silhouette with pulmonary vascular congestion. Accentuated interstitial markings in the mid to lower lungs question minimal pulmonary edema, much less severe than seen on previous exam. No pleural effusion or pneumothorax. No acute osseous findings. IMPRESSION: Enlargement of cardiac silhouette with pulmonary vascular congestion and question minimal edema. Electronically Signed   By: Ulyses SouthwardMark  Boles M.D.   On: 06/17/2017 16:22    EKG:   Orders placed or performed during the hospital encounter of 06/17/17  . ED EKG  . ED EKG    IMPRESSION AND PLAN:     #Symptomatic anemia Admit to MedSurg unit  hemoglobin at 5.5  transfuse PRBC and monitor hemoglobin hematocrit closely  Protonix   #Rectal bleeding- etiology of symptomatic anemia Transfuse as documented above GI consult placed Clear liquid diet and n.p.o. after midnight Protonix  #Syncope from symptomatic anemia Transfuse blood  #AKI on chronic kidney disease stage III Hydrate with IV fluids and avoid nephrotoxins Monitor renal function closely   #chronic COPD no exacerbation Nebulizer treatments as needed  #Chronic dementia-continue close monitoring  #Elevated lactic acid level with lower extremity wound Patient did not meet septic criteria as WBC  count is normal no tachycardia Blood cultures, wound cultures were ordered IV Rocephin Wound care    All the records are reviewed and case discussed with ED provider. Management plans discussed with the patient, family and they are in agreement.  CODE STATUS: Full code  TOTAL TIME TAKING CARE OF THIS PATIENT: 45 minutes.   Note: This dictation was prepared with Dragon dictation along with smaller phrase technology. Any transcriptional errors that result from this process are unintentional.  Ramonita LabAruna Khoury Siemon M.D on 06/17/2017 at 6:17 PM  Between 7am to 6pm - Pager - [phone]  After 6pm go to [email or web address] - password [email or web address]  [facility]  Office  [phone]  CC: Primary care physician; Keane PoliceSlade-Hartman, Venezela, MD

## 2017-06-18 DIAGNOSIS — D509 Iron deficiency anemia, unspecified: Secondary | ICD-10-CM

## 2017-06-18 DIAGNOSIS — K625 Hemorrhage of anus and rectum: Secondary | ICD-10-CM

## 2017-06-18 DIAGNOSIS — R55 Syncope and collapse: Secondary | ICD-10-CM

## 2017-06-18 LAB — COMPREHENSIVE METABOLIC PANEL
ALK PHOS: 61 U/L (ref 38–126)
ALT: 19 U/L (ref 17–63)
ANION GAP: 7 (ref 5–15)
AST: 20 U/L (ref 15–41)
Albumin: 3.4 g/dL — ABNORMAL LOW (ref 3.5–5.0)
BILIRUBIN TOTAL: 0.5 mg/dL (ref 0.3–1.2)
BUN: 63 mg/dL — ABNORMAL HIGH (ref 6–20)
CALCIUM: 8.2 mg/dL — AB (ref 8.9–10.3)
CO2: 25 mmol/L (ref 22–32)
Chloride: 101 mmol/L (ref 101–111)
Creatinine, Ser: 1.48 mg/dL — ABNORMAL HIGH (ref 0.61–1.24)
GFR, EST AFRICAN AMERICAN: 49 mL/min — AB (ref >60)
GFR, EST NON AFRICAN AMERICAN: 42 mL/min — AB (ref >60)
Glucose, Bld: 88 mg/dL (ref 65–99)
POTASSIUM: 5.1 mmol/L (ref 3.5–5.1)
Sodium: 133 mmol/L — ABNORMAL LOW (ref 135–145)
Total Protein: 6.5 g/dL (ref 6.5–8.1)

## 2017-06-18 LAB — CBC WITH DIFFERENTIAL/PLATELET
BAND NEUTROPHILS: 0 %
BLASTS: 0 %
Basophils Absolute: 0.2 10*3/uL — ABNORMAL HIGH (ref 0–0.1)
Basophils Relative: 3 %
EOS ABS: 0.1 10*3/uL (ref 0–0.7)
Eosinophils Relative: 1 %
HEMATOCRIT: 17 % — AB (ref 40.0–52.0)
HEMOGLOBIN: 5.5 g/dL — AB (ref 13.0–18.0)
LYMPHS ABS: 0.4 10*3/uL — AB (ref 1.0–3.6)
Lymphocytes Relative: 7 %
MCH: 26.8 pg (ref 26.0–34.0)
MCHC: 32.4 g/dL (ref 32.0–36.0)
MCV: 82.7 fL (ref 80.0–100.0)
METAMYELOCYTES PCT: 0 %
MYELOCYTES: 0 %
Monocytes Absolute: 0.8 10*3/uL (ref 0.2–1.0)
Monocytes Relative: 13 %
Neutro Abs: 4.5 10*3/uL (ref 1.4–6.5)
Neutrophils Relative %: 76 %
Other: 0 %
PROMYELOCYTES RELATIVE: 0 %
Platelets: 270 10*3/uL (ref 150–440)
RBC: 2.06 MIL/uL — AB (ref 4.40–5.90)
RDW: 17.2 % — ABNORMAL HIGH (ref 11.5–14.5)
WBC: 5 10*3/uL (ref 3.8–10.6)
nRBC: 1 /100 WBC — ABNORMAL HIGH

## 2017-06-18 LAB — HEMOGLOBIN A1C
Hgb A1c MFr Bld: 6.6 % — ABNORMAL HIGH (ref 4.8–5.6)
Mean Plasma Glucose: 142.72 mg/dL

## 2017-06-18 LAB — IRON AND TIBC
IRON: 30 ug/dL — AB (ref 45–182)
Saturation Ratios: 9 % — ABNORMAL LOW (ref 17.9–39.5)
TIBC: 342 ug/dL (ref 250–450)
UIBC: 312 ug/dL

## 2017-06-18 LAB — CBC
HEMATOCRIT: 21.3 % — AB (ref 40.0–52.0)
HEMOGLOBIN: 7 g/dL — AB (ref 13.0–18.0)
MCH: 27.7 pg (ref 26.0–34.0)
MCHC: 32.8 g/dL (ref 32.0–36.0)
MCV: 84.3 fL (ref 80.0–100.0)
Platelets: 226 10*3/uL (ref 150–440)
RBC: 2.53 MIL/uL — ABNORMAL LOW (ref 4.40–5.90)
RDW: 18.3 % — ABNORMAL HIGH (ref 11.5–14.5)
WBC: 5.3 10*3/uL (ref 3.8–10.6)

## 2017-06-18 LAB — VITAMIN B12: VITAMIN B 12: 943 pg/mL — AB (ref 180–914)

## 2017-06-18 LAB — PATHOLOGIST SMEAR REVIEW

## 2017-06-18 LAB — GLUCOSE, CAPILLARY
GLUCOSE-CAPILLARY: 133 mg/dL — AB (ref 65–99)
GLUCOSE-CAPILLARY: 179 mg/dL — AB (ref 65–99)

## 2017-06-18 LAB — FERRITIN: Ferritin: 19 ng/mL — ABNORMAL LOW (ref 24–336)

## 2017-06-18 LAB — FOLATE: Folate: 17.1 ng/mL (ref >5.9)

## 2017-06-18 MED ORDER — INSULIN ASPART 100 UNIT/ML ~~LOC~~ SOLN
0.0000 [IU] | Freq: Three times a day (TID) | SUBCUTANEOUS | Status: DC
  Administered 2017-06-18: 1 [IU] via SUBCUTANEOUS
  Administered 2017-06-19: 3 [IU] via SUBCUTANEOUS
  Administered 2017-06-19: 2 [IU] via SUBCUTANEOUS
  Administered 2017-06-19: 5 [IU] via SUBCUTANEOUS
  Administered 2017-06-20 (×2): 2 [IU] via SUBCUTANEOUS
  Administered 2017-06-21 (×2): 3 [IU] via SUBCUTANEOUS
  Administered 2017-06-22 – 2017-06-23 (×4): 5 [IU] via SUBCUTANEOUS
  Administered 2017-06-23: 7 [IU] via SUBCUTANEOUS
  Filled 2017-06-18 (×13): qty 1

## 2017-06-18 MED ORDER — FUROSEMIDE 10 MG/ML IJ SOLN: 40.0000 mg | Freq: Once | INTRAMUSCULAR | Status: DC

## 2017-06-18 MED ORDER — SODIUM CHLORIDE 0.9 % IV SOLN
300.0000 mg | Freq: Once | INTRAVENOUS | Status: AC
  Administered 2017-06-19: 300 mg via INTRAVENOUS
  Filled 2017-06-18: qty 15

## 2017-06-18 MED ORDER — SODIUM CHLORIDE 0.9 % IV SOLN
510.0000 mg | Freq: Once | INTRAVENOUS | Status: AC
  Administered 2017-06-18: 510 mg via INTRAVENOUS
  Filled 2017-06-18: qty 17

## 2017-06-18 MED ORDER — ACETAMINOPHEN 500 MG PO TABS
500.0000 mg | ORAL_TABLET | Freq: Three times a day (TID) | ORAL | Status: DC | PRN
  Administered 2017-06-19 – 2017-06-22 (×2): 500 mg via ORAL
  Filled 2017-06-18 (×2): qty 1

## 2017-06-18 NOTE — Progress Notes (Signed)
[facility] - [place] [facility]   PATIENT NAME: Billy BeamLawrence Quilling    MR#:  [id number]  DATE OF BIRTH:  10/20/1932  SUBJECTIVE:  CHIEF COMPLAINT:   Chief Complaint  Patient presents with  . Loss of Consciousness  . Fall  . Rectal Bleeding   no further bleeding. Afebrile. No abdominal pain. Pleasantly confused.  REVIEW OF SYSTEMS:    Review of Systems  Unable to perform ROS: Dementia    DRUG ALLERGIES:   Allergies  Allergen Reactions  . Penicillins Other (See Comments)    Per MAR     VITALS:  Blood pressure (!) 126/57, pulse 60, temperature (!) 97.2 F (36.2 C), temperature source Oral, resp. rate 17, height 6\' 4"  (1.93 m), weight 123.5 kg (272 lb 4.3 oz), SpO2 99 %.  PHYSICAL EXAMINATION:   Physical Exam  GENERAL:  84 y.o.-year-old patient lying in the bed with no acute distress.  EYES: Pupils equal, round, reactive to light and accommodation. No scleral icterus. Extraocular muscles intact.  HEENT: Head atraumatic, normocephalic. Oropharynx and nasopharynx clear.  NECK:  Supple, no jugular venous distention. No thyroid enlargement, no tenderness.  LUNGS: Normal breath sounds bilaterally, no wheezing, rales, rhonchi. No use of accessory muscles of respiration.  CARDIOVASCULAR: S1, S2 normal. No murmurs, rubs, or gallops.  ABDOMEN: Soft, nontender, nondistended. Bowel sounds present. No organomegaly or mass.  EXTREMITIES: No cyanosis, clubbing or edema b/l.    NEUROLOGIC: Cranial nerves II through XII are intact. No focal Motor or sensory deficits b/l.   PSYCHIATRIC: The patient is alert and awake. SKIN: No obvious rash, lesion, or ulcer.   LABORATORY PANEL:   CBC Recent Labs  Lab 06/18/17 0753  WBC 5.3  HGB 7.0*  HCT 21.3*  PLT 226   ------------------------------------------------------------------------------------------------------------------ Chemistries  Recent Labs  Lab 06/18/17 0753  NA 133*  K 5.1  CL 101  CO2 25  GLUCOSE  88  BUN 63*  CREATININE 1.48*  CALCIUM 8.2*  AST 20  ALT 19  ALKPHOS 61  BILITOT 0.5   ------------------------------------------------------------------------------------------------------------------  Cardiac Enzymes Recent Labs  Lab 06/17/17 1532  TROPONINI <0.03   ------------------------------------------------------------------------------------------------------------------  RADIOLOGY:  Ct Head Wo Contrast  Result Date: 06/17/2017 CLINICAL DATA:  Altered level of consciousness. EXAM: CT HEAD WITHOUT CONTRAST TECHNIQUE: Contiguous axial images were obtained from the base of the skull through the vertex without intravenous contrast. COMPARISON:  11/07/2009 FINDINGS: Brain: No evidence of acute infarction, hemorrhage, extra-axial collection, ventriculomegaly, or mass effect. Old area of encephalomalacia in the left parietal lobe. Generalized cerebral atrophy. Periventricular white matter low attenuation likely secondary to microangiopathy. Vascular: Cerebrovascular atherosclerotic calcifications are noted. Skull: Left frontoparietal craniotomy. Sinuses/Orbits: Visualized portions of the orbits are unremarkable. Visualized portions of the paranasal sinuses and mastoid air cells are unremarkable. Other: None. IMPRESSION: No acute intracranial pathology. Electronically Signed   By: Elige KoHetal  Patel   On: 06/17/2017 16:52   Dg Chest Port 1 View  Result Date: 06/17/2017 CLINICAL DATA:  Syncopal episode while on the toilet today, helped to the ground, history of seizures, Alzheimer dementia, chronic diastolic CHF, diabetes mellitus, stage III chronic kidney disease EXAM: PORTABLE CHEST 1 VIEW COMPARISON:  Portable exam 1555 hours compared to 10/27/2014 FINDINGS: Enlargement of cardiac silhouette with pulmonary vascular congestion. Accentuated interstitial markings in the mid to lower lungs question minimal pulmonary edema, much less severe than seen on previous exam. No pleural effusion or  pneumothorax. No acute osseous findings. IMPRESSION: Enlargement of cardiac silhouette with pulmonary  vascular congestion and question minimal edema. Electronically Signed   By: Ulyses Southward M.D.   On: 06/17/2017 16:22     ASSESSMENT AND PLAN:   *Acute blood loss anemia due to bright red blood per rectum. Likely diverticular bleed. No bleeding at this time. Transfuse two units packed RBC. Hemoglobin at seven. Transfused to keep hemoglobin at seven or more. Discussed with Dr. Allegra Lai G.I. No procedures advised at this time. If further drop in hemoglobin will request G.I. to see again.   *Dementia. Monitor for inpatient delirium.  *COPD stable  *AKI over security stage III has resolved  All the records are reviewed and case discussed with Care Management/Social Worker Management plans discussed with the patient, family and they are in agreement.  CODE STATUS: FULL CODE  DVT Prophylaxis: SCDs  TOTAL TIME TAKING CARE OF THIS PATIENT: 30 minutes.   POSSIBLE D/C IN 1-2 DAYS, DEPENDING ON CLINICAL CONDITION.  Orie Fisherman M.D on 06/18/2017 at 1:49 PM  Between 7am to 6pm - Pager - [phone]  After 6pm go to [email or web address] - password [email or web address] [facility]  [facility] [place] [facility]  Office  [phone]  CC: Primary care physician; Keane Police, MD  Note: This dictation was prepared with Dragon dictation along with smaller phrase technology. Any transcriptional errors that result from this process are unintentional.

## 2017-06-18 NOTE — Consult Note (Signed)
Billy Darby, MD [address]  [place]  [place], [place] [address]  Main: [phone]  Fax: [phone] Pager: [phone]   Consultation  Referring Provider:     No ref. provider found Primary Care Physician:  Alvester Morin, MD Primary Gastroenterologist:  Dr. Sherri Sear         Reason for Consultation:  Anemia and rectal bleeding  Date of Admission:  06/17/2017 Date of Consultation:  06/18/2017         HPI:   Billy Liu is a 84 y.o. male with multiple comorbidities including dementia, chronic kidney disease stage III, congestive heart failure, hypertension, diabetes who is brought to the emergency room after sustaining a fall at the [facility] nursing home. He was found to have blood in the stool. His hemoglobin on arrival was 5.5, 9.2 in 10/2014 with normal MCV. He received 2 units of PRBCs, responded to 7 this morning.is kept nothing by mouth and GI is consulted for further evaluation. He has worsening of kidney function. There were no witnessed episodes of hematemesis. According to his nurse, he did not have any further episodes of rectal bleeding since admission. He had iron studies which revealed low ferritin of 19, low iron and saturation Patient is a poor historian secondary to dementia  NSAIDs: none  Antiplts/Anticoagulants/Anti thrombotics: aspirin 81  GI Procedures: unknown  Past Medical History:  Diagnosis Date  . Anxiety   . Cellulitis 10/17/2014   left lower leg  . Chronic diastolic CHF (congestive heart failure) ([place])   . CKD (chronic kidney disease), stage III ([place])   . COPD (chronic obstructive pulmonary disease) ([place])   . Coronary artery disease   . Dementia    Alzheimer's   . Depression   . Diabetes mellitus without complication ([place])   . GERD (gastroesophageal reflux disease)   . Hyperlipemia   . Hypertension   . Obesity   . Renal disorder     Past Surgical History:  Procedure Laterality Date  . LOWER  EXTREMITY ANGIOGRAPHY Left 02/11/2017   Procedure: LOWER EXTREMITY ANGIOGRAPHY;  Surgeon: Katha Cabal, MD;  Location: [place] [facility];  Service: Cardiovascular;  Laterality: Left;  . LOWER EXTREMITY ANGIOGRAPHY Left 03/11/2017   Procedure: LOWER EXTREMITY ANGIOGRAPHY;  Surgeon: Katha Cabal, MD;  Location: [place] [facility];  Service: Cardiovascular;  Laterality: Left;  . LOWER EXTREMITY INTERVENTION  03/11/2017   Procedure: LOWER EXTREMITY INTERVENTION;  Surgeon: Katha Cabal, MD;  Location: [place] [facility];  Service: Cardiovascular;;  . PERIPHERAL VASCULAR ATHERECTOMY Left 02/18/2017   Procedure: PERIPHERAL VASCULAR ATHERECTOMY;  Surgeon: Katha Cabal, MD;  Location: [place] [facility];  Service: Cardiovascular;  Laterality: Left;    Prior to Admission medications   Medication Sig Start Date End Date Taking? Authorizing Provider  acetaminophen (TYLENOL) 500 MG tablet Take 500 mg by mouth 3 (three) times daily.   Yes [provider]  amLODipine (NORVASC) 10 MG tablet Take 10 mg by mouth daily.   Yes [provider]  aspirin EC 81 MG tablet Take 81 mg by mouth daily.   Yes [provider]  cloNIDine (CATAPRES) 0.2 MG tablet Take 1 tablet (0.2 mg total) by mouth 3 (three) times daily. Patient taking differently: Take 0.2 mg by mouth 2 (two) times daily.  10/31/14  Yes Henreitta Leber, MD  divalproex (DEPAKOTE ER) 250 MG 24 hr tablet Take 250 mg by mouth every morning.  Yes [provider]  divalproex (DEPAKOTE) 500 MG DR tablet Take 500 mg by mouth at bedtime.    Yes [provider]  finasteride (PROSCAR) 5 MG tablet Take 1 tablet (5 mg total) by mouth daily. 10/31/14  Yes Sainani, Belia Heman, MD  glucagon, human recombinant, (GLUCAGEN DIAGNOSTIC) 1 MG injection Inject 1 mg into the muscle once as needed for low blood sugar.   Yes [provider]  hydrALAZINE (APRESOLINE) 100 MG tablet Take 100 mg by mouth 3  (three) times daily.   Yes [provider]  insulin detemir (LEVEMIR) 100 UNIT/ML injection Inject 34 Units into the skin 2 (two) times daily.    Yes [provider]  insulin lispro (HUMALOG) 100 UNIT/ML injection Inject 18 units under the skin with breakfast and 24 units with lunch   Yes [provider]  metoprolol succinate (TOPROL-XL) 100 MG 24 hr tablet Take 300 mg by mouth daily.    Yes [provider]  polyethylene glycol (MIRALAX / GLYCOLAX) packet Take 17 g by mouth every morning.  03/14/17  Yes [provider]  senna (SENOKOT) 8.6 MG TABS tablet Take 2 tablets by mouth at bedtime.   Yes [provider]  sertraline (ZOLOFT) 25 MG tablet Take 75 mg by mouth daily.    Yes [provider]  simvastatin (ZOCOR) 20 MG tablet Take 30 mg by mouth at bedtime.    Yes [provider]  torsemide (DEMADEX) 20 MG tablet Take 2 tablets (40 mg total) by mouth daily. Patient taking differently: Take 100 mg by mouth daily.  10/31/14  Yes Sainani, Belia Heman, MD  Vitamin D, Ergocalciferol, (DRISDOL) 50000 UNITS CAPS capsule Take 50,000 Units by mouth every 30 (thirty) days.    Yes [provider]    Family History  Problem Relation Age of Onset  . Cancer Brother      Social History   Tobacco Use  . Smoking status: Never Smoker  . Smokeless tobacco: Former Systems developer    Types: Chew  Substance Use Topics  . Alcohol use: No  . Drug use: No    Allergies as of 06/17/2017 - Review Complete 06/17/2017  Allergen Reaction Noted  . Penicillins Other (See Comments) 10/11/2014    Review of Systems:    All systems reviewed and negative except where noted in HPI.   Physical Exam:  Vital signs in last 24 hours: Temp:  [96.3 F (35.7 C)-98 F (36.7 C)] 97.2 F (36.2 C) (06/12 0659) Pulse Rate:  [50-62] 61 (06/12 0659) Resp:  [14-22] 20 (06/12 0659) BP: (122-154)/(37-71) 144/71 (06/12 0956) SpO2:  [96 %-100 %] 98 % (06/12  0659) Weight:  [260 lb (117.9 kg)-272 lb 4.3 oz (123.5 kg)] 272 lb 4.3 oz (123.5 kg) (06/11 1858)   General:   Pleasant, cooperative in NAD Head:  Normocephalic and atraumatic. Eyes:   No icterus.   Conjunctiva pink. PERRLA. Ears:  Normal auditory acuity. Neck:  Supple; no masses or thyroidomegaly Lungs: Respirations even and unlabored. Lungs clear to auscultation bilaterally.   No wheezes, crackles, or rhonchi.  Heart:  Regular rate and rhythm;  Without murmur, clicks, rubs or gallops Abdomen:  Soft, obese, nondistended, nontender. Normal bowel sounds. No appreciable masses or hepatomegaly.  No rebound or guarding.  Rectal:  Digital rectal exam revealed soft brown stool with no evidence of hematochezia Msk:  Symmetrical without gross deformities.  Strength generalized weakness  Extremities:  Without edema, cyanosis or clubbing. Neurologic:  Alert and  oriented x1; Skin:  Intact without significant lesions or rashes.  LAB RESULTS: CBC Latest Ref Rng & Units 06/18/2017 06/17/2017 06/17/2017  WBC 3.8 - 10.6 K/uL 5.3 - -  Hemoglobin 13.0 - 18.0 g/dL 7.0(L) 5.5(L) 5.4(L)  Hematocrit 40.0 - 52.0 % 21.3(L) 17.0(L) 17.1(L)  Platelets 150 - 440 K/uL 226 - -    BMET BMP Latest Ref Rng & Units 06/18/2017 06/17/2017 03/11/2017  Glucose 65 - 99 mg/dL 88 198(H) -  BUN 6 - 20 mg/dL 63(H) 71(H) 34(H)  Creatinine 0.61 - 1.24 mg/dL 1.48(H) 1.92(H) 1.40(H)  Sodium 135 - 145 mmol/L 133(L) 132(L) -  Potassium 3.5 - 5.1 mmol/L 5.1 5.1 -  Chloride 101 - 111 mmol/L 101 95(L) -  CO2 22 - 32 mmol/L 25 23 -  Calcium 8.9 - 10.3 mg/dL 8.2(L) 8.7(L) -    LFT Hepatic Function Latest Ref Rng & Units 06/18/2017 06/17/2017 10/27/2014  Total Protein 6.5 - 8.1 g/dL 6.5 7.1 6.6  Albumin 3.5 - 5.0 g/dL 3.4(L) 3.6 3.4(L)  AST 15 - 41 U/L 20 29 72(H)  ALT 17 - 63 U/L 19 19 142(H)  Alk Phosphatase 38 - 126 U/L 61 70 91  Total Bilirubin 0.3 - 1.2 mg/dL 0.5 0.3 0.4     STUDIES: Ct Head Wo Contrast  Result Date:  06/17/2017 CLINICAL DATA:  Altered level of consciousness. EXAM: CT HEAD WITHOUT CONTRAST TECHNIQUE: Contiguous axial images were obtained from the base of the skull through the vertex without intravenous contrast. COMPARISON:  11/07/2009 FINDINGS: Brain: No evidence of acute infarction, hemorrhage, extra-axial collection, ventriculomegaly, or mass effect. Old area of encephalomalacia in the left parietal lobe. Generalized cerebral atrophy. Periventricular white matter low attenuation likely secondary to microangiopathy. Vascular: Cerebrovascular atherosclerotic calcifications are noted. Skull: Left frontoparietal craniotomy. Sinuses/Orbits: Visualized portions of the orbits are unremarkable. Visualized portions of the paranasal sinuses and mastoid air cells are unremarkable. Other: None. IMPRESSION: No acute intracranial pathology. Electronically Signed   By: Kathreen Devoid   On: 06/17/2017 16:52   Dg Chest Port 1 View  Result Date: 06/17/2017 CLINICAL DATA:  Syncopal episode while on the toilet today, helped to the ground, history of seizures, Alzheimer dementia, chronic diastolic CHF, diabetes mellitus, stage III chronic kidney disease EXAM: PORTABLE CHEST 1 VIEW COMPARISON:  Portable exam 1555 hours compared to 10/27/2014 FINDINGS: Enlargement of cardiac silhouette with pulmonary vascular congestion. Accentuated interstitial markings in the mid to lower lungs question minimal pulmonary edema, much less severe than seen on previous exam. No pleural effusion or pneumothorax. No acute osseous findings. IMPRESSION: Enlargement of cardiac silhouette with pulmonary vascular congestion and question minimal edema. Electronically Signed   By: Lavonia Dana M.D.   On: 06/17/2017 16:22      Impression / Plan:   Billy Liu is a 84 y.o. male with multiple comorbidities admitted with anemia after sustaining a fall and witnessed episode of rectal bleeding. He has severe iron deficiency. Patient is no longer having  bleeding per rectum. Ideally, with h/o iron deficiency anemia EGD and colonoscopy are indicated. However, Given his overall health condition, and history of dementia would defer any endoscopic intervention at this time as risks overweigh the benefits.   - Recommend parenteral iron therapy as inpatient - Oral iron upon discharge - If patient's family insists on endoscopic evaluation, patient will need to undergo bowel prep and GI can perform EGD and colonoscopy  Thank you for involving me in the care of this patient.  LOS: 1 day   Sherri Sear, MD  06/18/2017, 11:36 AM   Note: This dictation was prepared with Dragon dictation along with smaller phrase technology. Any transcriptional errors that result from this process are unintentional.

## 2017-06-18 NOTE — Progress Notes (Signed)
It is not clear whether the patient is able to make informed decisions concerning AD. Chaplin informed the Licensed conveyancerunit secretary and nurse to page the on-call chaplain when family visits to have a further conversation.

## 2017-06-18 NOTE — Progress Notes (Signed)
Patient's brother, Billy LeavellJames Schlagel, believes that the patient is able to make an informed decision. Chaplain disagrees as the patient has limitations from a brain tumor, dementia, and is currently taking pain medications. Chaplain asked the unit secretary/notary to make an independent assessment. Of note, the nephew is the patient's HCA, not the brother.  To date, a conversation with the nephew has not taken place concerning revisions in the patient's advanced planning.

## 2017-06-19 DIAGNOSIS — D500 Iron deficiency anemia secondary to blood loss (chronic): Secondary | ICD-10-CM

## 2017-06-19 LAB — GLUCOSE, CAPILLARY
GLUCOSE-CAPILLARY: 268 mg/dL — AB (ref 65–99)
GLUCOSE-CAPILLARY: 239 mg/dL — AB (ref 65–99)
Glucose-Capillary: 177 mg/dL — ABNORMAL HIGH (ref 65–99)
Glucose-Capillary: 206 mg/dL — ABNORMAL HIGH (ref 65–99)

## 2017-06-19 LAB — HEMOGLOBIN: Hemoglobin: 6.8 g/dL — ABNORMAL LOW (ref 13.0–18.0)

## 2017-06-19 LAB — PREPARE RBC (CROSSMATCH)

## 2017-06-19 LAB — HEMOGLOBIN AND HEMATOCRIT, BLOOD
HEMATOCRIT: 26.2 % — AB (ref 40.0–52.0)
HEMOGLOBIN: 9 g/dL — AB (ref 13.0–18.0)

## 2017-06-19 MED ORDER — SODIUM CHLORIDE 0.9 % IV SOLN
Freq: Once | INTRAVENOUS | Status: AC
  Administered 2017-06-19: 14:00:00 via INTRAVENOUS

## 2017-06-19 MED ORDER — PEG 3350-KCL-NA BICARB-NACL 420 G PO SOLR
4000.0000 mL | Freq: Once | ORAL | Status: AC
  Administered 2017-06-19: 4000 mL via ORAL
  Filled 2017-06-19: qty 4000

## 2017-06-19 NOTE — Progress Notes (Signed)
Talked with HCPOA (Nephew) at bedside. They would like to have EGD & C-Scope while here. Made Dr Allegra LaiVanga aware.

## 2017-06-19 NOTE — Progress Notes (Signed)
[facility] - [place] at [facility]   PATIENT NAME: Billy Liu    MR#:  [id number]  DATE OF BIRTH:  07/18/1932  SUBJECTIVE:  CHIEF COMPLAINT:   Chief Complaint  Patient presents with  . Loss of Consciousness  . Fall  . Rectal Bleeding  no new complaints. nephew at bedside. Would like to get endoscopy for further eval REVIEW OF SYSTEMS:    Review of Systems  Unable to perform ROS: Dementia   DRUG ALLERGIES:   Allergies  Allergen Reactions  . Penicillins Other (See Comments)    Per MAR     VITALS:  Blood pressure (!) 169/63, pulse 65, temperature 97.7 F (36.5 C), temperature source Oral, resp. rate 20, height 6\' 4"  (1.93 m), weight 123.5 kg (272 lb 4.3 oz), SpO2 91 %.  PHYSICAL EXAMINATION:   Physical Exam  GENERAL:  84 y.o.-year-old patient lying in the bed with no acute distress.  EYES: Pupils equal, round, reactive to light and accommodation. No scleral icterus. Extraocular muscles intact.  HEENT: Head atraumatic, normocephalic. Oropharynx and nasopharynx clear.  NECK:  Supple, no jugular venous distention. No thyroid enlargement, no tenderness.  LUNGS: Normal breath sounds bilaterally, no wheezing, rales, rhonchi. No use of accessory muscles of respiration.  CARDIOVASCULAR: S1, S2 normal. No murmurs, rubs, or gallops.  ABDOMEN: Soft, nontender, nondistended. Bowel sounds present. No organomegaly or mass.  EXTREMITIES: No cyanosis, clubbing or edema b/l.    NEUROLOGIC: Cranial nerves II through XII are intact. No focal Motor or sensory deficits b/l.   PSYCHIATRIC: The patient is alert and awake. SKIN: No obvious rash, lesion, or ulcer.  LABORATORY PANEL:   CBC Recent Labs  Lab 06/18/17 0753 06/19/17 0339  WBC 5.3  --   HGB 7.0* 6.8*  HCT 21.3*  --   PLT 226  --    ------------------------------------------------------------------------------------------------------------------ Chemistries  Recent Labs  Lab 06/18/17 0753  NA 133*   K 5.1  CL 101  CO2 25  GLUCOSE 88  BUN 63*  CREATININE 1.48*  CALCIUM 8.2*  AST 20  ALT 19  ALKPHOS 61  BILITOT 0.5   ------------------------------------------------------------------------------------------------------------------  Cardiac Enzymes Recent Labs  Lab 06/17/17 1532  TROPONINI <0.03   ------------------------------------------------------------------------------------------------------------------  RADIOLOGY:  Ct Head Wo Contrast  Result Date: 06/17/2017 CLINICAL DATA:  Altered level of consciousness. EXAM: CT HEAD WITHOUT CONTRAST TECHNIQUE: Contiguous axial images were obtained from the base of the skull through the vertex without intravenous contrast. COMPARISON:  11/07/2009 FINDINGS: Brain: No evidence of acute infarction, hemorrhage, extra-axial collection, ventriculomegaly, or mass effect. Old area of encephalomalacia in the left parietal lobe. Generalized cerebral atrophy. Periventricular white matter low attenuation likely secondary to microangiopathy. Vascular: Cerebrovascular atherosclerotic calcifications are noted. Skull: Left frontoparietal craniotomy. Sinuses/Orbits: Visualized portions of the orbits are unremarkable. Visualized portions of the paranasal sinuses and mastoid air cells are unremarkable. Other: None. IMPRESSION: No acute intracranial pathology. Electronically Signed   By: Elige KoHetal  Patel   On: 06/17/2017 16:52   ASSESSMENT AND PLAN:   *Acute blood loss anemia due to bright red blood per rectum. Likely diverticular bleed. No bleeding at this time. Transfuse two units packed RBC. Hemoglobin at 6.8 Transfused 1 prbc to keep hemoglobin at seven or more. - GI planned EGD & C-scope tomorrow  *Dementia. Monitor for inpatient delirium.  *COPD stable  *AKI over security stage III - at baseline    All the records are reviewed and case discussed with Care Management/Social Worker Management plans discussed with the  patient, family (nephew at  bedside) and they are in agreement.  CODE STATUS: FULL CODE  DVT Prophylaxis: SCDs  TOTAL TIME TAKING CARE OF THIS PATIENT: 30 minutes.   POSSIBLE D/C IN 1-2 DAYS, DEPENDING ON CLINICAL CONDITION.  Delfino Lovett M.D on 06/19/2017 at 4:22 PM  Between 7am to 6pm - Pager - [phone]  After 6pm go to [email or web address] - password [email or web address] [facility]  [facility]  Office  [phone]  CC: Primary care physician; Keane Police, MD  Note: This dictation was prepared with Dragon dictation along with smaller phrase technology. Any transcriptional errors that result from this process are unintentional.

## 2017-06-19 NOTE — Clinical Social Work Note (Signed)
Clinical Social Work Assessment  Patient Details  Name: Billy Liu MRN: [id number] Date of Birth: 02/18/1932  Date of referral:  06/19/17               Reason for consult:  Discharge Planning                Permission sought to share information with:  Family Supports Permission granted to share information::  Yes, Verbal Permission Granted  Name::        Agency::     Relationship::     Contact Information:     Housing/Transportation Living arrangements for the past 2 months:  Skilled Nursing Facility Source of Information:  Patient, Other (Comment Required)(nephew: Billy Liu:: [phone]) Patient Interpreter Needed:  None Criminal Activity/Legal Involvement Pertinent to Current Situation/Hospitalization:  No - Comment as needed Significant Relationships:  Other(Comment)(nephew) Lives with:  Facility Resident Do you feel safe going back to the place where you live?  Yes Need for family participation in patient care:  Yes (Comment)  Care giving concerns:  [facility].   Social Worker assessment / plan:  CSW attempted to speak with patient but much of his verbal responses were unintelligible due to a previous stroke. He was able to clearly state that he had been at [facility] for [facility].   CSW contacted patient's nephew: Billy Liu: [phone] and he was able to confirm that the patient is correct. Billy Liu wishes for patient to return [facility] when time.  Employment status:  Retired Database administratornsurance information:  [place] Medicare, [place] Recommendations:    Information / Referral to community resources:     Patient/Family's Response to care:  Patient and nephew expressed appreciation for CSW assistance.  Patient/Family's Understanding of and Emotional Response to Diagnosis, Current Treatment, and Prognosis:  Patient's nephew was aware of patient receiving further testing today and is hopeful he can return to [facility] soon.  Emotional Assessment Appearance:  Appears stated age Attitude/Demeanor/Rapport:  (pleasant ) Affect (typically observed):  Calm Orientation:  Oriented to Self, Oriented to Place, Oriented to  Time Alcohol / Substance use:  Not Applicable Psych involvement (Current and /or in the community):  No (Comment)  Discharge Needs  Concerns to be addressed:  Care Coordination Readmission within the last 30 days:  No Current discharge risk:  None Barriers to Discharge:  Continued Medical Work up   [employer]el[employer] CorporationMonica Darlinda Bellows, LCSW 06/19/2017, 11:21 AM

## 2017-06-19 NOTE — NC FL2 (Signed)
[place] MEDICAID FL2 LEVEL OF CARE SCREENING TOOL     IDENTIFICATION  Patient Name: Billy Liu Birthdate: March 30, 1932 Sex: male Admission Date (Current Location): 06/17/2017  [facility] and [place] Number:  Chiropodist and Address:  [facility], [address] [place], [place] [address]      Provider Number: [id number]  Attending Physician Name and Address:  Delfino Lovett, MD  Relative Name and Phone Number:       Current Level of Care: Hospital Recommended Level of Care: Skilled Nursing Facility Prior Approval Number:    Date Approved/Denied:   PASRR Number:    Discharge Plan: SNF    Current Diagnoses: Patient Active Problem List   Diagnosis Date Noted  . Symptomatic anemia 06/17/2017  . Pressure injury of skin 06/17/2017  . Atherosclerosis of native arteries of extremity with intermittent claudication (HCC) 12/25/2016  . Chronic venous insufficiency 12/25/2016  . Lymphedema 10/23/2016  . Hyperlipidemia 10/23/2016  . Chronic diastolic heart failure (HCC) 11/10/2014  . Obstructive sleep apnea 11/10/2014  . CAD (coronary artery disease) 10/11/2014  . COPD (chronic obstructive pulmonary disease) (HCC) 10/11/2014  . Type 2 diabetes mellitus (HCC) 10/11/2014  . HTN (hypertension) 10/11/2014  . GERD (gastroesophageal reflux disease) 10/11/2014  . CKD (chronic kidney disease), stage III (HCC) 10/11/2014    Orientation RESPIRATION BLADDER Height & Weight     Self, Time, Place  Normal Incontinent Weight: 272 lb 4.3 oz (123.5 kg) Height:  6\' 4"  (193 cm)  BEHAVIORAL SYMPTOMS/MOOD NEUROLOGICAL BOWEL NUTRITION STATUS  (none) (none) Incontinent Diet  AMBULATORY STATUS COMMUNICATION OF NEEDS Skin   Total Care Verbally PU Stage and Appropriate Care                       Personal Care Assistance Level of Assistance  Dressing, Bathing, Feeding Bathing Assistance: Maximum assistance Feeding assistance: Maximum assistance Dressing  Assistance: Maximum assistance     Functional Limitations Info  Speech     Speech Info: Impaired    SPECIAL CARE FACTORS FREQUENCY                       Contractures Contractures Info: Not present    Additional Factors Info                  Current Medications (06/19/2017):  This is the current hospital active medication list Current Facility-Administered Medications  Medication Dose Route Frequency Provider Last Rate Last Dose  . acetaminophen (TYLENOL) tablet 500 mg  500 mg Oral Q8H PRN Milagros Loll, MD   500 mg at 06/19/17 0220  . cefTRIAXone (ROCEPHIN) 1 g in sodium chloride 0.9 % 100 mL IVPB  1 g Intravenous Q24H [name], MD   Stopped at 06/18/17 1950  . cloNIDine (CATAPRES) tablet 0.2 mg  0.2 mg Oral TID [name], MD   0.2 mg at 06/18/17 2323  . divalproex (DEPAKOTE ER) 24 hr tablet 250 mg  250 mg Oral Jake Michaelis, MD   250 mg at 06/19/17 0648  . divalproex (DEPAKOTE) DR tablet 500 mg  500 mg Oral QHS Gouru, Aruna, MD   500 mg at 06/18/17 2328  . finasteride (PROSCAR) tablet 5 mg  5 mg Oral Daily Gouru, Aruna, MD   5 mg at 06/18/17 0957  . hydrALAZINE (APRESOLINE) tablet 100 mg  100 mg Oral TID [name], MD   100 mg at 06/18/17 2323  . insulin aspart (novoLOG) injection  0-9 Units  0-9 Units Subcutaneous TID WC Milagros LollSudini, Srikar, MD   2 Units at 06/19/17 0756  . pantoprazole (PROTONIX) injection 40 mg  40 mg Intravenous Q12H Gouru, Aruna, MD   40 mg at 06/18/17 2324  . polyethylene glycol (MIRALAX / GLYCOLAX) packet 17 g  17 g Oral Jake MichaelisBH-[id number] Gouru, Aruna, MD   17 g at 06/19/17 0648  . polyethylene glycol-electrolytes (NuLYTELY/GoLYTELY) solution 4,000 mL  4,000 mL Oral Once Toney ReilVanga, Rohini Reddy, MD      . senna [facility])(SENOKOT) tablet 17.2 mg  2 tablet Oral QHS Gouru, Aruna, MD   17.2 mg at 06/18/17 2322  . sertraline (ZOLOFT) tablet 75 mg  75 mg Oral Daily Gouru, Aruna, MD   75 mg at 06/18/17 0956  . traMADol (ULTRAM) tablet 50 mg  50 mg Oral Q6H PRN  Gouru, Aruna, MD   50 mg at 06/18/17 2322     Discharge Medications: Please see discharge summary for a list of discharge medications.  Relevant Imaging Results:  Relevant Lab Results:   Additional Information    York SpanielMonica Caydon Feasel, LCSW

## 2017-06-19 NOTE — Progress Notes (Signed)
Arlyss Repress, MD [address]  [address]  [place], [place] [address]  Main: [phone]  Fax: [phone] Pager: [phone]   Subjective: His hemoglobin dropped to 6.8 today, receive 1 unit PRBCs. He is lying in bed comfortably, no acute events overnight. His healthcare power of attorney requested upper endoscopy and colonoscopy   Objective: Vital signs in last 24 hours: Vitals:   06/19/17 1149 06/19/17 1227 06/19/17 1358 06/19/17 1427  BP: (!) 157/58  (!) 165/63 (!) 169/63  Pulse:  69 65 65  Resp:  20 20 20   Temp:  98.3 F (36.8 C) 98 F (36.7 C) 97.7 F (36.5 C)  TempSrc:  Oral Oral Oral  SpO2:   92% 91%  Weight:      Height:       Weight change:   Intake/Output Summary (Last 24 hours) at 06/19/2017 1455 Last data filed at 06/19/2017 0230 Gross per 24 hour  Intake 405 ml  Output -  Net 405 ml     Exam: Heart:: Regular rate and rhythm or S1S2 present Lungs: clear to auscultation Abdomen: soft, nontender, normal bowel sounds   Lab Results: CBC Latest Ref Rng & Units 06/19/2017 06/18/2017 06/17/2017  WBC 3.8 - 10.6 K/uL - 5.3 -  Hemoglobin 13.0 - 18.0 g/dL 9.6(E) 7.0(L) 5.5(L)  Hematocrit 40.0 - 52.0 % - 21.3(L) 17.0(L)  Platelets 150 - 440 K/uL - 226 -   Micro Results: Recent Results (from the past 240 hour(s))  Blood Culture (routine x 2)     Status: None (Preliminary result)   Collection Time: 06/17/17  5:21 PM  Result Value Ref Range Status   Specimen Description BLOOD LEFT ANTECUBITAL  Final   Special Requests   Final    BOTTLES DRAWN AEROBIC AND ANAEROBIC Blood Culture adequate volume   Culture   Final    NO GROWTH 2 DAYS Performed at [facility], [address] [place], [place] [address]    Report Status PENDING  Incomplete  Blood Culture (routine x 2)     Status: None (Preliminary result)   Collection Time: 06/17/17  5:42 PM  Result Value Ref Range Status   Specimen Description BLOOD RIGHT ANTECUBITAL  Final   Special  Requests   Final    BOTTLES DRAWN AEROBIC AND ANAEROBIC Blood Culture results may not be optimal due to an inadequate volume of blood received in culture bottles   Culture   Final    NO GROWTH 2 DAYS Performed at [facility], [address], [place], [place] [address]    Report Status PENDING  Incomplete  Aerobic/Anaerobic Culture (surgical/deep wound)     Status: None (Preliminary result)   Collection Time: 06/17/17  6:10 PM  Result Value Ref Range Status   Specimen Description   Final    LEG RIGHT LEG Performed at [facility], [address] [place] [address], [place], [place] [address]    Special Requests   Final    NONE Performed at [facility], [address] [place], [place] [address]    Gram Stain   Final    ABUNDANT WBC PRESENT, PREDOMINANTLY PMN ABUNDANT GRAM POSITIVE COCCI MODERATE GRAM NEGATIVE RODS MODERATE GRAM POSITIVE RODS    Culture   Final    CULTURE REINCUBATED FOR BETTER GROWTH Performed at [facility], [address], [place], [place] [address]    Report Status PENDING  Incomplete  MRSA PCR Screening     Status: None  Collection Time: 06/17/17  7:08 PM  Result Value Ref Range Status   MRSA by PCR NEGATIVE NEGATIVE Final    Comment:        The GeneXpert MRSA Assay (FDA approved for NASAL specimens only), is one component of a comprehensive MRSA colonization surveillance program. It is not intended to diagnose MRSA infection nor to guide or monitor treatment for MRSA infections. Performed at [facility], [address], [place], [place] [address]   Urine culture     Status: Abnormal (Preliminary result)   Collection Time: 06/17/17  9:46 PM  Result Value Ref Range Status   Specimen Description   Final    URINE, RANDOM Performed at [facility], [address], [place], [place] [address]    Special Requests   Final    NONE Performed at [facility], [address] [place], [place] [address]     Culture 80,000 COLONIES/mL ESCHERICHIA COLI (A)  Final   Report Status PENDING  Incomplete   Studies/Results: Ct Head Wo Contrast  Result Date: 06/17/2017 CLINICAL DATA:  Altered level of consciousness. EXAM: CT HEAD WITHOUT CONTRAST TECHNIQUE: Contiguous axial images were obtained from the base of the skull through the vertex without intravenous contrast. COMPARISON:  11/07/2009 FINDINGS: Brain: No evidence of acute infarction, hemorrhage, extra-axial collection, ventriculomegaly, or mass effect. Old area of encephalomalacia in the left parietal lobe. Generalized cerebral atrophy. Periventricular white matter low attenuation likely secondary to microangiopathy. Vascular: Cerebrovascular atherosclerotic calcifications are noted. Skull: Left frontoparietal craniotomy. Sinuses/Orbits: Visualized portions of the orbits are unremarkable. Visualized portions of the paranasal sinuses and mastoid air cells are unremarkable. Other: None. IMPRESSION: No acute intracranial pathology. Electronically Signed   By: Elige Ko   On: 06/17/2017 16:52   Dg Chest Port 1 View  Result Date: 06/17/2017 CLINICAL DATA:  Syncopal episode while on the toilet today, helped to the ground, history of seizures, Alzheimer dementia, chronic diastolic CHF, diabetes mellitus, stage III chronic kidney disease EXAM: PORTABLE CHEST 1 VIEW COMPARISON:  Portable exam 1555 hours compared to 10/27/2014 FINDINGS: Enlargement of cardiac silhouette with pulmonary vascular congestion. Accentuated interstitial markings in the mid to lower lungs question minimal pulmonary edema, much less severe than seen on previous exam. No pleural effusion or pneumothorax. No acute osseous findings. IMPRESSION: Enlargement of cardiac silhouette with pulmonary vascular congestion and question minimal edema. Electronically Signed   By: Ulyses Southward M.D.   On: 06/17/2017 16:22   Medications: I have reviewed the patient's current medications. Scheduled Meds: .  cloNIDine  0.2 mg Oral TID  . divalproex  250 mg Oral BH-q7a  . divalproex  500 mg Oral QHS  . finasteride  5 mg Oral Daily  . hydrALAZINE  100 mg Oral TID  . insulin aspart  0-9 Units Subcutaneous TID WC  . pantoprazole (PROTONIX) IV  40 mg Intravenous Q12H  . polyethylene glycol  17 g Oral BH-q7a  . polyethylene glycol-electrolytes  4,000 mL Oral Once  . senna  2 tablet Oral QHS  . sertraline  75 mg Oral Daily   Continuous Infusions: . sodium chloride    . cefTRIAXone (ROCEPHIN)  IV Stopped (06/18/17 1950)   PRN Meds:.acetaminophen, traMADol   Assessment: Active Problems:   Symptomatic anemia   Pressure injury of skin  iron deficiency anemia, rectal bleeding Received ferriheme and Venofer  Plan: Clear liquid diet Bowel prep today EGD and colonoscopy tomorrow Iron replacement therapy   LOS: 2 days   Ava Deguire  06/19/2017, 2:55 PM

## 2017-06-20 ENCOUNTER — Encounter
Admission: EM | Disposition: A | Discharge status: Skilled Nursing Facility | Payer: Self-pay | Source: Skilled Nursing Facility | Attending: Internal Medicine

## 2017-06-20 ENCOUNTER — Inpatient Hospital Stay: Payer: Medicare Other | Admitting: Anesthesiology

## 2017-06-20 ENCOUNTER — Inpatient Hospital Stay: Payer: Medicare Other

## 2017-06-20 DIAGNOSIS — D500 Iron deficiency anemia secondary to blood loss (chronic): Secondary | ICD-10-CM

## 2017-06-20 DIAGNOSIS — K3189 Other diseases of stomach and duodenum: Secondary | ICD-10-CM

## 2017-06-20 DIAGNOSIS — K259 Gastric ulcer, unspecified as acute or chronic, without hemorrhage or perforation: Secondary | ICD-10-CM

## 2017-06-20 HISTORY — PX: COLONOSCOPY: SHX5424

## 2017-06-20 HISTORY — PX: ESOPHAGOGASTRODUODENOSCOPY: SHX5428

## 2017-06-20 LAB — CBC
HEMATOCRIT: 25.9 % — AB (ref 40.0–52.0)
HEMOGLOBIN: 8.8 g/dL — AB (ref 13.0–18.0)
MCH: 29.3 pg (ref 26.0–34.0)
MCHC: 34 g/dL (ref 32.0–36.0)
MCV: 86.2 fL (ref 80.0–100.0)
Platelets: 229 10*3/uL (ref 150–440)
RBC: 3.01 MIL/uL — ABNORMAL LOW (ref 4.40–5.90)
RDW: 18.8 % — ABNORMAL HIGH (ref 11.5–14.5)
WBC: 6.1 10*3/uL (ref 3.8–10.6)

## 2017-06-20 LAB — TYPE AND SCREEN
ABO/RH(D): AB POS
ANTIBODY SCREEN: NEGATIVE
Unit division: 0
Unit division: 0
Unit division: 0

## 2017-06-20 LAB — BASIC METABOLIC PANEL
ANION GAP: 7 (ref 5–15)
BUN: 32 mg/dL — AB (ref 6–20)
CHLORIDE: 103 mmol/L (ref 101–111)
CO2: 25 mmol/L (ref 22–32)
Calcium: 8.5 mg/dL — ABNORMAL LOW (ref 8.9–10.3)
Creatinine, Ser: 1.34 mg/dL — ABNORMAL HIGH (ref 0.61–1.24)
GFR calc Af Amer: 55 mL/min — ABNORMAL LOW (ref >60)
GFR, EST NON AFRICAN AMERICAN: 47 mL/min — AB (ref >60)
GLUCOSE: 169 mg/dL — AB (ref 65–99)
POTASSIUM: 4.9 mmol/L (ref 3.5–5.1)
Sodium: 135 mmol/L (ref 135–145)

## 2017-06-20 LAB — GLUCOSE, CAPILLARY
Glucose-Capillary: 166 mg/dL — ABNORMAL HIGH (ref 65–99)
Glucose-Capillary: 164 mg/dL — ABNORMAL HIGH (ref 65–99)
Glucose-Capillary: 150 mg/dL — ABNORMAL HIGH (ref 65–99)

## 2017-06-20 LAB — BPAM RBC
BLOOD PRODUCT EXPIRATION DATE: 201907042359
BLOOD PRODUCT EXPIRATION DATE: 201907072359
BLOOD PRODUCT EXPIRATION DATE: 201907092359
ISSUE DATE / TIME: 201906112258
ISSUE DATE / TIME: 201906120300
ISSUE DATE / TIME: 201906131404
UNIT TYPE AND RH: 6200
Unit Type and Rh: 6200
Unit Type and Rh: 6200

## 2017-06-20 LAB — URINE CULTURE

## 2017-06-20 LAB — AEROBIC/ANAEROBIC CULTURE (SURGICAL/DEEP WOUND)

## 2017-06-20 LAB — AEROBIC/ANAEROBIC CULTURE W GRAM STAIN (SURGICAL/DEEP WOUND)

## 2017-06-20 SURGERY — EGD (ESOPHAGOGASTRODUODENOSCOPY)
Anesthesia: General

## 2017-06-20 MED ORDER — SODIUM CHLORIDE 0.9 % IV SOLN
510.0000 mg | Freq: Once | INTRAVENOUS | Status: AC
  Administered 2017-06-20: 510 mg via INTRAVENOUS
  Filled 2017-06-20: qty 17

## 2017-06-20 MED ORDER — PROPOFOL 500 MG/50ML IV EMUL
INTRAVENOUS | Status: DC | PRN
  Administered 2017-06-20: 150 ug/kg/min via INTRAVENOUS

## 2017-06-20 MED ORDER — IPRATROPIUM-ALBUTEROL 0.5-2.5 (3) MG/3ML IN SOLN
3.0000 mL | Freq: Once | RESPIRATORY_TRACT | Status: AC
  Administered 2017-06-20: 3 mL via RESPIRATORY_TRACT

## 2017-06-20 MED ORDER — PROPOFOL 10 MG/ML IV BOLUS
INTRAVENOUS | Status: DC | PRN
  Administered 2017-06-20: 50 mg via INTRAVENOUS
  Administered 2017-06-20: 10 mg via INTRAVENOUS

## 2017-06-20 MED ORDER — PROPOFOL 500 MG/50ML IV EMUL
INTRAVENOUS | Status: AC
  Filled 2017-06-20: qty 50

## 2017-06-20 MED ORDER — LIDOCAINE HCL (CARDIAC) PF 100 MG/5ML IV SOSY
PREFILLED_SYRINGE | INTRAVENOUS | Status: DC | PRN
  Administered 2017-06-20: 40 mg via INTRAVENOUS

## 2017-06-20 MED ORDER — IPRATROPIUM-ALBUTEROL 0.5-2.5 (3) MG/3ML IN SOLN
RESPIRATORY_TRACT | Status: AC
  Administered 2017-06-20: 3 mL via RESPIRATORY_TRACT
  Filled 2017-06-20: qty 3

## 2017-06-20 MED ORDER — HYDRALAZINE HCL 20 MG/ML IJ SOLN
10.0000 mg | Freq: Four times a day (QID) | INTRAMUSCULAR | Status: DC | PRN
  Administered 2017-06-20 – 2017-06-21 (×2): 10 mg via INTRAVENOUS
  Filled 2017-06-20 (×2): qty 1

## 2017-06-20 MED ORDER — SODIUM CHLORIDE 0.9 % IV SOLN
Freq: Once | INTRAVENOUS | Status: AC
  Administered 2017-06-20: 16:00:00 via INTRAVENOUS

## 2017-06-20 MED ORDER — SODIUM CHLORIDE 0.9 % IV SOLN
INTRAVENOUS | Status: DC | PRN
  Administered 2017-06-20: 17:00:00 via INTRAVENOUS

## 2017-06-20 MED ORDER — LIDOCAINE HCL (PF) 2 % IJ SOLN
INTRAMUSCULAR | Status: AC
  Filled 2017-06-20: qty 10

## 2017-06-20 MED ORDER — LIDOCAINE HCL (CARDIAC) PF 100 MG/5ML IV SOSY
PREFILLED_SYRINGE | INTRAVENOUS | Status: DC | PRN
  Administered 2017-06-20: 60 mg via INTRATRACHEAL

## 2017-06-20 NOTE — Progress Notes (Addendum)
Inpatient Diabetes Program Recommendations  AACE/ADA: New Consensus Statement on Inpatient Glycemic Control (2019)  Target Ranges:  Prepandial:   less than 140 mg/dL      Peak postprandial:   less than 180 mg/dL (1-2 hours)      Critically ill patients:  140 - 180 mg/dL  Results for Feliz BeamROXLER, Bryar (MRN [id number]) as of 06/20/2017 07:43  Ref. Range 06/20/2017 05:20  Glucose Latest Ref Range: 65 - 99 mg/dL 401169 (H)   Results for Feliz BeamROXLER, Tramain (MRN [id number]) as of 06/20/2017 07:43  Ref. Range 06/19/2017 07:29 06/19/2017 11:31 06/19/2017 16:37 06/19/2017 22:30  Glucose-Capillary Latest Ref Range: 65 - 99 mg/dL 403177 (H)  Novolog 2 units 268 (H)  Novolog 5 units 239 (H)  Novolog 3 units 206 (H)  Results for Feliz BeamROXLER, Montay (MRN [id number]) as of 06/20/2017 07:43  Ref. Range 06/18/2017 14:11  Hemoglobin A1C Latest Ref Range: 4.8 - 5.6 % 6.6 (H)   Review of Glycemic Control  Diabetes history: DM2 Outpatient Diabetes medications: Levemir 34 units BID, Humalog 18 units with breakfast, Humalog, Humalog 24 units with lunch, Humalog 12 units with supper Current orders for Inpatient glycemic control: Novolog 0-9 units TID with meals  Inpatient Diabetes Program Recommendations: Correction (SSI): Please consider ordering Novolog 0-5 units QHS for bedtime correction. Insulin - Meal Coverage: Once diet is resumed, please consider ordering Novolog 5 units TID with meals for meal coverage. HgbA1C: A1C 6.6% on 06/18/17 indicating an average glucose of 143 mg/dl over the past 2-3 months.  Given patient's dementia and age, would recommend goal A1C of around 7.5%. Patient has not received any basal insulin since being admitted on 06/17/17 and fasting glucose has ranged from 88-177 mg/dl and post prandial glucose is consistently elevated. Recommend MD request [facility] check patient's glucose 4 times per day (fasting, before lunch, before supper, and at bedtime) 7 days a week.  Based on more data from glucose  trends [facility], outpatient insulin regimen may need to be adjusted to prevent any issues with hypoglycemia.   NOTE: Called [facility] (spoke with Jasmine DecemberSharon, Nurse) and to confirm insulin regimen and inquire about glycemic control. Jasmine DecemberSharon, Nurse confirmed outpatient insulin regimen as noted above and on home medication list. Patient's glucose is only checked on Monday, Wednesday, and Friday twice a day. Over the past week, fasting glucose has ranged from 150-256 mg/dl and evening glucose has ranged from 99-172 mg/dl. Jasmine DecemberSharon, Nurse reports that patient has a good appetite and patient eats 100% of meals as well as snacks. Strongly recommend MD prescribe patient's glucose be checked 4 times per day (fasting, before lunch, before supper, and at bedtime) 7 days a week.  Thanks, Orlando PennerMarie Isais Klipfel, RN, MSN, CDE Diabetes Coordinator Inpatient Diabetes Program [phone] (Team Pager from 8am to 5pm)

## 2017-06-20 NOTE — Progress Notes (Signed)
Patient SOB at rest. O2 sat 86% on RA. Placed pt on 3L via [place]. O2 sat now 93%. Will continue to monitor

## 2017-06-20 NOTE — Progress Notes (Signed)
Report called to Aurther Lofterry, RN on 2C. Pt transported back to room in stable condition.

## 2017-06-20 NOTE — Anesthesia Post-op Follow-up Note (Signed)
Anesthesia QCDR form completed.        

## 2017-06-20 NOTE — Transfer of Care (Signed)
Immediate Anesthesia Transfer of Care Note  Patient: Billy Liu  Procedure(s) Performed: ESOPHAGOGASTRODUODENOSCOPY (EGD) (N/A ) COLONOSCOPY (N/A )  Patient Location: PACU  Anesthesia Type:General  Level of Consciousness: awake  Airway & Oxygen Therapy: Patient Spontanous Breathing and Patient connected to nasal cannula oxygen  Post-op Assessment: Report given to RN and Post -op Vital signs reviewed and stable  Post vital signs: Reviewed and stable  Last Vitals:  Vitals Value Taken Time  BP 196/75 06/20/2017  6:02 PM  Temp 36.2 C 06/20/2017  5:56 PM  Pulse 87 06/20/2017  6:02 PM  Resp 20 06/20/2017  6:02 PM  SpO2 100 % 06/20/2017  6:02 PM  Vitals shown include unvalidated device data.  Last Pain:  Vitals:   06/20/17 1756  TempSrc: Tympanic  PainSc:          Complications: No apparent anesthesia complications

## 2017-06-20 NOTE — Op Note (Signed)
[facility] Gastroenterology Patient Name: Billy Liu Procedure Date: 06/20/2017 5:12 PM MRN: [id number] Account #: [id number] Date of Birth: 04-09-1932 Admit Type: Outpatient Age: 84 Room: [facility] [place] [facility] ENDO ROOM 2 Gender: Male Note Status: Finalized Procedure:            Colonoscopy Indications:          Iron deficiency anemia secondary to chronic blood loss Providers:            Lin Landsman MD, MD Medicines:            Monitored Anesthesia Care Complications:        No immediate complications. Estimated blood loss: None. Procedure:            Pre-Anesthesia Assessment:                       - Prior to the procedure, a History and Physical was                        performed, and patient medications and allergies were                        reviewed. The patient is unable to give consent                        secondary to the patient being legally incompetent to                        consent. The risks and benefits of the procedure and                        the sedation options and risks were discussed with the                        patient's guardian. All questions were answered and                        informed consent was obtained. Patient identification                        and proposed procedure were verified by the physician,                        the nurse, the anesthesiologist, the anesthetist and                        the technician in the pre-procedure area in the                        procedure room in the endoscopy suite. Mental Status                        Examination: dementia. Airway Examination: normal                        oropharyngeal airway and neck mobility. Respiratory                        Examination: clear to auscultation. CV Examination:  normal. Prophylactic Antibiotics: The patient does not                        require prophylactic antibiotics. Prior Anticoagulants:                        The  patient has taken no previous anticoagulant or                        antiplatelet agents. ASA Grade Assessment: IV - A                        patient with severe systemic disease that is a constant                        threat to life. After reviewing the risks and benefits,                        the patient was deemed in satisfactory condition to                        undergo the procedure. The anesthesia plan was to use                        monitored anesthesia care (MAC). Immediately prior to                        administration of medications, the patient was                        re-assessed for adequacy to receive sedatives. The                        heart rate, respiratory rate, oxygen saturations, blood                        pressure, adequacy of pulmonary ventilation, and                        response to care were monitored throughout the                        procedure. The physical status of the patient was                        re-assessed after the procedure.                       After obtaining informed consent, the colonoscope was                        passed under direct vision. Throughout the procedure,                        the patient's blood pressure, pulse, and oxygen                        saturations were monitored continuously. The  Colonoscope was introduced through the anus with the                        intention of advancing to the cecum. The scope was                        advanced to the sigmoid colon before the procedure was                        aborted. Medications were given. The colonoscopy was                        performed with difficulty due to poor bowel prep. The                        patient tolerated the procedure well. The quality of                        the bowel preparation was poor. Findings:      The digital rectal exam findings include thick brown stool. Pertinent       negatives include normal  sphincter tone and no palpable rectal lesions.      Extensive amounts of semi-liquid stool was found in the rectum, in the       sigmoid colon and in the descending colon, precluding visualization.       Procedure aborted and scope withdrawn Impression:           - Preparation of the colon was poor.                       - Thick brown stool found on digital rectal exam.                       - Stool in the rectum, in the sigmoid colon and in the                        descending colon.                       - No specimens collected. Recommendation:       - Return patient to hospital ward for ongoing care.                       - Resume previous diet today.                       - Continue present medications.                       - Return to GI office in 4 weeks. Procedure Code(s):    --- Professional ---                       [phone], 32, Colonoscopy, flexible; diagnostic, including                        collection of specimen(s) by brushing or washing, when                        performed (separate procedure) Diagnosis Code(s):    ---  Professional ---                       D50.0, Iron deficiency anemia secondary to blood loss                        (chronic) CPT copyright 2017 American Medical Association. All rights reserved. The codes documented in this report are preliminary and upon coder review may  be revised to meet current compliance requirements. Dr. Ulyess Mort Lin Landsman MD, MD 06/20/2017 5:46:16 PM This report has been signed electronically. Number of Addenda: 0 Note Initiated On: 06/20/2017 5:12 PM Scope Withdrawal Time: 0 hours 0 minutes 1 second  Total Procedure Duration: 0 hours 2 minutes 38 seconds       [facility]

## 2017-06-20 NOTE — Progress Notes (Signed)
Pt Bp 204/130. Notified Dr Nemiah CommanderKalisetti. Gave 10mg  Hydralazine and oral Bp meds. Will continue to monitor and make night shift aware.

## 2017-06-20 NOTE — Progress Notes (Signed)
EGD and colonoscopy post procedure note  Clean-based ulcers in stomach Gastric erythema, biopsies performed to rule out H. Pylori Colonoscopy was incomplete due to poor prep. Thick liquid brown stool in colon. Procedure aborted  Recommendations Continue IV iron while inpatient Oral iron therapy upon discharge Follow up in GI clinic in 4 weeks Discuss with family about repeat colonoscopy as outpatient patient not responding to iron replacement therapy as patient is not actively bleeding at this time  GI to sign off, please call GI back with questions or concerns  Arlyss Repressohini R Vanga, MD [address]  [address]  [place], [place] [address]  Main: [phone]  Fax: [phone] Pager: [phone]

## 2017-06-20 NOTE — Op Note (Signed)
[facility] Gastroenterology Patient Name: Billy Liu Procedure Date: 06/20/2017 5:12 PM MRN: [id number] Account #: [id number] Date of Birth: December 29, 1932 Admit Type: Outpatient Age: 84 Room: [facility] ENDO ROOM 2 Gender: Male Note Status: Finalized Procedure:            Upper GI endoscopy Indications:          Iron deficiency anemia secondary to chronic blood loss Providers:            Lin Landsman MD, MD Medicines:            Monitored Anesthesia Care Complications:        No immediate complications. Estimated blood loss: None. Procedure:            Pre-Anesthesia Assessment:                       - Prior to the procedure, a History and Physical was                        performed, and patient medications and allergies were                        reviewed. The patient is unable to give consent                        secondary to the patient being legally incompetent to                        consent. The risks and benefits of the procedure and                        the sedation options and risks were discussed with the                        patient's guardian. All questions were answered and                        informed consent was obtained. Patient identification                        and proposed procedure were verified by the physician,                        the nurse, the anesthesiologist, the anesthetist and                        the technician in the pre-procedure area in the                        procedure room in the endoscopy suite. Mental Status                        Examination: dementia. Airway Examination: normal                        oropharyngeal airway and neck mobility. Respiratory                        Examination: clear to auscultation. CV Examination:  normal. Prophylactic Antibiotics: The patient does not                        require prophylactic antibiotics. Prior Anticoagulants:    The patient has taken no previous anticoagulant or                        antiplatelet agents. ASA Grade Assessment: IV - A                        patient with severe systemic disease that is a constant                        threat to life. After reviewing the risks and benefits,                        the patient was deemed in satisfactory condition to                        undergo the procedure. The anesthesia plan was to use                        monitored anesthesia care (MAC). Immediately prior to                        administration of medications, the patient was                        re-assessed for adequacy to receive sedatives. The                        heart rate, respiratory rate, oxygen saturations, blood                        pressure, adequacy of pulmonary ventilation, and                        response to care were monitored throughout the                        procedure. The physical status of the patient was                        re-assessed after the procedure.                       After obtaining informed consent, the endoscope was                        passed under direct vision. Throughout the procedure,                        the patient's blood pressure, pulse, and oxygen                        saturations were monitored continuously. The Endoscope                        was introduced through the mouth, and advanced to the  second part of duodenum. The upper GI endoscopy was                        accomplished without difficulty. The patient tolerated                        the procedure well. Findings:      The duodenal bulb and second portion of the duodenum were normal.      Three non-bleeding superficial gastric ulcers with a clean ulcer base       (Forrest Class III) were found in the gastric antrum and in the       prepyloric region of the stomach. The largest lesion was 5 mm in largest       dimension.      Diffuse  moderately erythematous mucosa without bleeding was found in the       entire examined stomach. Biopsies were taken with a cold forceps for       Helicobacter pylori testing.      The gastroesophageal junction and examined esophagus were normal. Impression:           - Normal duodenal bulb and second portion of the                        duodenum.                       - Non-bleeding gastric ulcers with a clean ulcer base                        (Forrest Class III).                       - Erythematous mucosa in the stomach. Biopsied.                       - Normal gastroesophageal junction and esophagus. Recommendation:       - Await pathology results, treat H pylori if positive.                       - Use a proton pump inhibitor PO BID for 3 months.                       - Continue iron therapy                       - No ibuprofen, naproxen, or other non-steroidal                        anti-inflammatory drugs. Procedure Code(s):    --- Professional ---                       [phone], Esophagogastroduodenoscopy, flexible, transoral;                        with biopsy, single or multiple Diagnosis Code(s):    --- Professional ---                       K25.9, Gastric ulcer, unspecified as acute or chronic,  without hemorrhage or perforation                       K31.89, Other diseases of stomach and duodenum                       D50.0, Iron deficiency anemia secondary to blood loss                        (chronic) CPT copyright 2017 American Medical Association. All rights reserved. The codes documented in this report are preliminary and upon coder review may  be revised to meet current compliance requirements. Dr. Ulyess Mort Lin Landsman MD, MD 06/20/2017 5:32:47 PM This report has been signed electronically. Number of Addenda: 0 Note Initiated On: 06/20/2017 5:12 PM      [facility]

## 2017-06-20 NOTE — Anesthesia Preprocedure Evaluation (Signed)
Anesthesia Evaluation  Patient identified by MRN, date of birth, ID band Patient awake    Reviewed: Allergy & Precautions, H&P , NPO status , Patient's Chart, lab work & pertinent test results, reviewed documented beta blocker date and time   Airway Mallampati: II   Neck ROM: full    Dental  (+) Poor Dentition   Pulmonary neg pulmonary ROS, sleep apnea , COPD,    Pulmonary exam normal        Cardiovascular Exercise Tolerance: Poor hypertension, On Medications + CAD, + Peripheral Vascular Disease and +CHF  negative cardio ROS Normal cardiovascular exam Rhythm:regular Rate:Normal     Neuro/Psych PSYCHIATRIC DISORDERS Anxiety Depression Dementia negative neurological ROS  negative psych ROS   GI/Hepatic negative GI ROS, Neg liver ROS, GERD  Medicated,  Endo/Other  negative endocrine ROSdiabetes  Renal/GU Renal diseasenegative Renal ROS  negative genitourinary   Musculoskeletal   Abdominal   Peds  Hematology negative hematology ROS (+) anemia ,   Anesthesia Other Findings Past Medical History: No date: Anxiety 10/17/2014: Cellulitis     Comment:  left lower leg No date: Chronic diastolic CHF (congestive heart failure) (HCC) No date: CKD (chronic kidney disease), stage III (HCC) No date: COPD (chronic obstructive pulmonary disease) (HCC) No date: Coronary artery disease No date: Dementia     Comment:  Alzheimer's  No date: Depression No date: Diabetes mellitus without complication (HCC) No date: GERD (gastroesophageal reflux disease) No date: Hyperlipemia No date: Hypertension No date: Obesity No date: Renal disorder Past Surgical History: 02/11/2017: LOWER EXTREMITY ANGIOGRAPHY; Left     Comment:  Procedure: LOWER EXTREMITY ANGIOGRAPHY;  Surgeon:               Renford DillsSchnier, Gregory G, MD;  Location: [facility];               Service: Cardiovascular;  Laterality: Left; 03/11/2017: LOWER EXTREMITY  ANGIOGRAPHY; Left     Comment:  Procedure: LOWER EXTREMITY ANGIOGRAPHY;  Surgeon:               Renford DillsSchnier, Gregory G, MD;  Location: [facility];               Service: Cardiovascular;  Laterality: Left; 03/11/2017: LOWER EXTREMITY INTERVENTION     Comment:  Procedure: LOWER EXTREMITY INTERVENTION;  Surgeon:               Renford DillsSchnier, Gregory G, MD;  Location: [facility];               Service: Cardiovascular;; 02/18/2017: PERIPHERAL VASCULAR ATHERECTOMY; Left     Comment:  Procedure: PERIPHERAL VASCULAR ATHERECTOMY;  Surgeon:               Renford DillsSchnier, Gregory G, MD;  Location: [facility];               Service: Cardiovascular;  Laterality: Left; BMI    Body Mass Index:  33.14 kg/m     Reproductive/Obstetrics negative OB ROS                             Anesthesia Physical Anesthesia Plan  ASA: IV and emergent  Anesthesia Plan: General   Post-op Pain Management:    Induction:   PONV Risk Score and Plan:   Airway Management Planned:   Additional Equipment:   Intra-op Plan:   Post-operative Plan:   Informed Consent: I have reviewed the  patients History and Physical, chart, labs and discussed the procedure including the risks, benefits and alternatives for the proposed anesthesia with the patient or authorized representative who has indicated his/her understanding and acceptance.   Dental Advisory Given  Plan Discussed with: CRNA  Anesthesia Plan Comments:         Anesthesia Quick Evaluation

## 2017-06-20 NOTE — Care Management Important Message (Signed)
Copy of signed IM left with patient in room.  

## 2017-06-20 NOTE — Progress Notes (Signed)
[facility] - [place] at [facility]   PATIENT NAME: Billy Liu    MR#:  [id number]  DATE OF BIRTH:  10/04/1932  SUBJECTIVE:  CHIEF COMPLAINT:   Chief Complaint  Patient presents with  . Loss of Consciousness  . Fall  . Rectal Bleeding  waiting for endoscopy REVIEW OF SYSTEMS:    Review of Systems  Unable to perform ROS: Dementia   DRUG ALLERGIES:   Allergies  Allergen Reactions  . Penicillins Other (See Comments)    Per MAR     VITALS:  Blood pressure (!) 155/80, pulse 79, temperature 98.1 F (36.7 C), temperature source Oral, resp. rate 18, height 6\' 4"  (1.93 m), weight 123.5 kg (272 lb 4.3 oz), SpO2 99 %.  PHYSICAL EXAMINATION:   Physical Exam  GENERAL:  84 y.o.-year-old patient lying in the bed with no acute distress.  EYES: Pupils equal, round, reactive to light and accommodation. No scleral icterus. Extraocular muscles intact.  HEENT: Head atraumatic, normocephalic. Oropharynx and nasopharynx clear.  NECK:  Supple, no jugular venous distention. No thyroid enlargement, no tenderness.  LUNGS: Normal breath sounds bilaterally, no wheezing, rales, rhonchi. No use of accessory muscles of respiration.  CARDIOVASCULAR: S1, S2 normal. No murmurs, rubs, or gallops.  ABDOMEN: Soft, nontender, nondistended. Bowel sounds present. No organomegaly or mass.  EXTREMITIES: No cyanosis, clubbing or edema b/l.    NEUROLOGIC: Cranial nerves II through XII are intact. No focal Motor or sensory deficits b/l.   PSYCHIATRIC: The patient is alert and awake. SKIN: No obvious rash, lesion, or ulcer.  LABORATORY PANEL:   CBC Recent Labs  Lab 06/20/17 0520  WBC 6.1  HGB 8.8*  HCT 25.9*  PLT 229   ------------------------------------------------------------------------------------------------------------------ Chemistries  Recent Labs  Lab 06/18/17 0753 06/20/17 0520  NA 133* 135  K 5.1 4.9  CL 101 103  CO2 25 25  GLUCOSE 88 169*  BUN 63* 32*   CREATININE 1.48* 1.34*  CALCIUM 8.2* 8.5*  AST 20  --   ALT 19  --   ALKPHOS 61  --   BILITOT 0.5  --    ------------------------------------------------------------------------------------------------------------------  Cardiac Enzymes Recent Labs  Lab 06/17/17 1532  TROPONINI <0.03   ------------------------------------------------------------------------------------------------------------------  RADIOLOGY:  No results found. ASSESSMENT AND PLAN:   *Acute blood loss anemia due to bright red blood per rectum. Likely diverticular bleed. No bleeding at this time. Transfuse two units packed RBC. Hemoglobin 6.8->8.8 Transfused prbc to keep hemoglobin at seven or more. - GI planned EGD & C-scope today  *Dementia. Monitor for inpatient delirium.  *COPD stable  *AKI over security stage III - at baseline    All the records are reviewed and case discussed with Care Management/Social Worker Management plans discussed with the patient, family (nephew at bedside) and they are in agreement.  CODE STATUS: FULL CODE  DVT Prophylaxis: SCDs  TOTAL TIME TAKING CARE OF THIS PATIENT: 30 minutes.   POSSIBLE D/C IN 1-2 DAYS, DEPENDING ON CLINICAL CONDITION.  Billy Liu M.D on 06/20/2017 at 12:03 PM  Between 7am to 6pm - Pager - [phone]  After 6pm go to [email or web address] - password [email or web address]  [facility] [place] [facility]  Office  [phone]  CC: Primary care physician; Billy Liu, Venezela, MD  Note: This dictation was prepared with Dragon dictation along with smaller phrase technology. Any transcriptional errors that result from this process are unintentional.

## 2017-06-21 LAB — BASIC METABOLIC PANEL
Anion gap: 7 (ref 5–15)
BUN: 22 mg/dL — ABNORMAL HIGH (ref 6–20)
CALCIUM: 8.6 mg/dL — AB (ref 8.9–10.3)
CO2: 23 mmol/L (ref 22–32)
Chloride: 102 mmol/L (ref 101–111)
Creatinine, Ser: 1.07 mg/dL (ref 0.61–1.24)
Glucose, Bld: 179 mg/dL — ABNORMAL HIGH (ref 65–99)
POTASSIUM: 5.4 mmol/L — AB (ref 3.5–5.1)
Sodium: 132 mmol/L — ABNORMAL LOW (ref 135–145)

## 2017-06-21 LAB — CBC
HCT: 25.8 % — ABNORMAL LOW (ref 40.0–52.0)
Hemoglobin: 8.5 g/dL — ABNORMAL LOW (ref 13.0–18.0)
MCH: 29 pg (ref 26.0–34.0)
MCHC: 33.1 g/dL (ref 32.0–36.0)
MCV: 87.6 fL (ref 80.0–100.0)
PLATELETS: 233 10*3/uL (ref 150–440)
RBC: 2.94 MIL/uL — AB (ref 4.40–5.90)
RDW: 19.6 % — ABNORMAL HIGH (ref 11.5–14.5)
WBC: 6.8 10*3/uL (ref 3.8–10.6)

## 2017-06-21 LAB — GLUCOSE, CAPILLARY
GLUCOSE-CAPILLARY: 159 mg/dL — AB (ref 65–99)
GLUCOSE-CAPILLARY: 217 mg/dL — AB (ref 65–99)
Glucose-Capillary: 235 mg/dL — ABNORMAL HIGH (ref 65–99)
Glucose-Capillary: 307 mg/dL — ABNORMAL HIGH (ref 65–99)

## 2017-06-21 MED ORDER — FUROSEMIDE 10 MG/ML IJ SOLN
60.0000 mg | Freq: Two times a day (BID) | INTRAMUSCULAR | Status: DC
  Administered 2017-06-21 – 2017-06-23 (×4): 60 mg via INTRAVENOUS
  Filled 2017-06-21 (×5): qty 8

## 2017-06-21 MED ORDER — AMLODIPINE BESYLATE 10 MG PO TABS
10.0000 mg | ORAL_TABLET | Freq: Every day | ORAL | Status: DC
  Administered 2017-06-21 – 2017-06-23 (×3): 10 mg via ORAL
  Filled 2017-06-21 (×3): qty 1

## 2017-06-21 MED ORDER — SODIUM POLYSTYRENE SULFONATE 15 GM/60ML PO SUSP
30.0000 g | Freq: Once | ORAL | Status: AC
Start: 2017-06-21 — End: 2017-06-21
  Administered 2017-06-21: 30 g via ORAL
  Filled 2017-06-21: qty 120

## 2017-06-21 MED ORDER — HYDRALAZINE HCL 20 MG/ML IJ SOLN
20.0000 mg | Freq: Four times a day (QID) | INTRAMUSCULAR | Status: DC | PRN
  Administered 2017-06-22: 20 mg via INTRAVENOUS
  Filled 2017-06-21: qty 1

## 2017-06-21 NOTE — Evaluation (Signed)
Clinical/Bedside Swallow Evaluation Patient Details  Name: Billy Liu MRN: [id number] Date of Birth: 1932/01/24  Today's Date: 06/21/2017 Time: SLP Start Time (ACUTE ONLY): 1100 SLP Stop Time (ACUTE ONLY): 1122 SLP Time Calculation (min) (ACUTE ONLY): 22 min  Past Medical History:  Past Medical History:  Diagnosis Date  . Anxiety   . Cellulitis 10/17/2014   left lower leg  . Chronic diastolic CHF (congestive heart failure) (HCC)   . CKD (chronic kidney disease), stage III (HCC)   . COPD (chronic obstructive pulmonary disease) (HCC)   . Coronary artery disease   . Dementia    Alzheimer's   . Depression   . Diabetes mellitus without complication (HCC)   . GERD (gastroesophageal reflux disease)   . Hyperlipemia   . Hypertension   . Obesity   . Renal disorder    Past Surgical History:  Past Surgical History:  Procedure Laterality Date  . LOWER EXTREMITY ANGIOGRAPHY Left 02/11/2017   Procedure: LOWER EXTREMITY ANGIOGRAPHY;  Surgeon: Renford Dills, MD;  Location: [facility];  Service: Cardiovascular;  Laterality: Left;  . LOWER EXTREMITY ANGIOGRAPHY Left 03/11/2017   Procedure: LOWER EXTREMITY ANGIOGRAPHY;  Surgeon: Renford Dills, MD;  Location: [facility];  Service: Cardiovascular;  Laterality: Left;  . LOWER EXTREMITY INTERVENTION  03/11/2017   Procedure: LOWER EXTREMITY INTERVENTION;  Surgeon: Renford Dills, MD;  Location: [facility];  Service: Cardiovascular;;  . PERIPHERAL VASCULAR ATHERECTOMY Left 02/18/2017   Procedure: PERIPHERAL VASCULAR ATHERECTOMY;  Surgeon: Renford Dills, MD;  Location: [facility];  Service: Cardiovascular;  Laterality: Left;   HPI:      Assessment / Plan / Recommendation Clinical Impression  pt presents with a moderate oral phase dysphagia as characterized by poor mastication, poor awareness of bolus trials and increased a-p transit. however pt did swallow trials of solidss without attempting  to masticate on occasion. pt has poor dental status and roatary mastication appeared inadequate. pt has reports of coughing and choking with PM meal and AM meal from nurse. slp recommends to downgrade diet to puree with thin at this time.  SLP Visit Diagnosis: Dysphagia, oral phase (R13.11)    Aspiration Risk  Moderate aspiration risk    Diet Recommendation Dysphagia 1 (Puree);Thin liquid   Liquid Administration via: Cup;Straw Medication Administration: Crushed with puree Supervision: Patient able to self feed Compensations: Slow rate;Follow solids with liquid;Small sips/bites Postural Changes: Seated upright at 90 degrees    Other  Recommendations Oral Care Recommendations: Oral care BID   Follow up Recommendations        Frequency and Duration min 3x week  2 weeks       Prognosis Prognosis for Safe Diet Advancement: Good Barriers to Reach Goals: Cognitive deficits      Swallow Study   General Date of Onset: 06/21/17 Type of Study: Bedside Swallow Evaluation Diet Prior to this Study: Regular;Thin liquids Temperature Spikes Noted: No Respiratory Status: Room air History of Recent Intubation: No Behavior/Cognition: Alert;Cooperative;Pleasant mood Oral Cavity Assessment: Within Functional Limits Oral Care Completed by SLP: No Oral Cavity - Dentition: Missing dentition Vision: Functional for self-feeding Self-Feeding Abilities: Able to feed self Patient Positioning: Upright in bed Baseline Vocal Quality: Normal Volitional Cough: Strong Volitional Swallow: Able to elicit    Oral/Motor/Sensory Function Overall Oral Motor/Sensory Function: Within functional limits   Ice Chips Ice chips: Within functional limits Presentation: Spoon   Thin Liquid Thin Liquid: Within functional limits Presentation: Cup;Straw;Self Fed  Nectar Thick Nectar Thick Liquid: Not tested   Honey Thick Honey Thick Liquid: Not tested   Puree Puree: Within functional limits Presentation: Spoon    Solid   GO   Solid: Impaired Oral Phase Impairments: Reduced labial seal;Reduced lingual movement/coordination;Impaired mastication        Yony Roulston Harris Sauber 06/21/2017,11:33 AM

## 2017-06-21 NOTE — Progress Notes (Signed)
[facility] - [place] at [facility]   PATIENT NAME: Billy Liu    MR#:  [id number]  DATE OF BIRTH:  11/01/1932  SUBJECTIVE:  CHIEF COMPLAINT:   Chief Complaint  Patient presents with  . Loss of Consciousness  . Fall  . Rectal Bleeding   No further bleeding today. Some dysphagia with tabs.  REVIEW OF SYSTEMS:    Review of Systems  Unable to perform ROS: Dementia   DRUG ALLERGIES:   Allergies  Allergen Reactions  . Penicillins Other (See Comments)    Per MAR     VITALS:  Blood pressure (!) 180/65, pulse 87, temperature 98.2 F (36.8 C), temperature source Oral, resp. rate (!) 26, height 6\' 4"  (1.93 m), weight 123.5 kg (272 lb 4.3 oz), SpO2 98 %.  PHYSICAL EXAMINATION:   Physical Exam  GENERAL:  84 y.o.-year-old patient lying in the bed with no acute distress.  EYES: Pupils equal, round, reactive to light and accommodation. No scleral icterus. Extraocular muscles intact.  HEENT: Head atraumatic, normocephalic. Oropharynx and nasopharynx clear.  NECK:  Supple, no jugular venous distention. No thyroid enlargement, no tenderness.  LUNGS: Normal breath sounds bilaterally, no wheezing, rales, rhonchi. No use of accessory muscles of respiration.  CARDIOVASCULAR: S1, S2 normal. No murmurs, rubs, or gallops.  ABDOMEN: Soft, nontender, nondistended. Bowel sounds present. No organomegaly or mass.  EXTREMITIES: No cyanosis, clubbing or edema b/l.    NEUROLOGIC: Moves all 4 extremities PSYCHIATRIC: The patient is alert and awake. SKIN: No obvious rash, lesion, or ulcer.  LABORATORY PANEL:   CBC Recent Labs  Lab 06/21/17 0613  WBC 6.8  HGB 8.5*  HCT 25.8*  PLT 233   ------------------------------------------------------------------------------------------------------------------ Chemistries  Recent Labs  Lab 06/18/17 0753  06/21/17 0613  NA 133*   < > 132*  K 5.1   < > 5.4*  CL 101   < > 102  CO2 25   < > 23  GLUCOSE 88   < > 179*  BUN 63*    < > 22*  CREATININE 1.48*   < > 1.07  CALCIUM 8.2*   < > 8.6*  AST 20  --   --   ALT 19  --   --   ALKPHOS 61  --   --   BILITOT 0.5  --   --    < > = values in this interval not displayed.   ------------------------------------------------------------------------------------------------------------------  Cardiac Enzymes Recent Labs  Lab 06/17/17 1532  TROPONINI <0.03   ------------------------------------------------------------------------------------------------------------------  RADIOLOGY:  Dg Chest Port 1 View  Result Date: 06/20/2017 CLINICAL DATA:  Aspiration EXAM: PORTABLE CHEST 1 VIEW COMPARISON:  June 17, 2017 FINDINGS: Mild cardiomegaly is stable. Increasing bilateral pulmonary opacities suggest edema, possibly with small layering effusions. No other acute abnormalities. IMPRESSION: Cardiomegaly and new pulmonary opacities, likely edema. Electronically Signed   By: Gerome Samavid  Williams III M.D   On: 06/20/2017 20:48   ASSESSMENT AND PLAN:   * Acute hypoxic resp failure pulm edema on cxr Start Iv lasix Reviewed echo  *Acute blood loss anemia due to bright red blood per rectum. No acute bleeding on EGD or colonoscopy- duodenal ulcer Transfused two units packed RBC. Hemoglobin 6.8->8.8  *Dementia. Monitor for inpatient delirium.  *COPD stable  *AKI over CKD III At baseline  All the records are reviewed and case discussed with Care Management/Social Worker Management plans discussed with the patient, family (nephew at bedside) and they are in agreement.  CODE STATUS: FULL CODE  DVT Prophylaxis: SCDs  TOTAL TIME TAKING CARE OF THIS PATIENT: 30 minutes.   POSSIBLE D/C IN 1-2 DAYS, DEPENDING ON CLINICAL CONDITION.  Orie Fisherman M.D on 06/21/2017 at 12:21 PM  Between 7am to 6pm - Pager - [phone]  After 6pm go to [email or web address] - password [email or web address] [facility]  [facility] [place] [facility]  Office  [phone]  CC: Primary care physician; Keane Police, MD  Note: This dictation was prepared with Dragon dictation along with smaller phrase technology. Any transcriptional errors that result from this process are unintentional.

## 2017-06-22 LAB — CULTURE, BLOOD (ROUTINE X 2)
CULTURE: NO GROWTH
Culture: NO GROWTH
Special Requests: ADEQUATE

## 2017-06-22 LAB — BASIC METABOLIC PANEL
Anion gap: 6 (ref 5–15)
BUN: 17 mg/dL (ref 6–20)
CHLORIDE: 100 mmol/L — AB (ref 101–111)
CO2: 26 mmol/L (ref 22–32)
Calcium: 8.5 mg/dL — ABNORMAL LOW (ref 8.9–10.3)
Creatinine, Ser: 1.05 mg/dL (ref 0.61–1.24)
GFR calc Af Amer: >60 mL/min (ref >60)
GFR calc non Af Amer: >60 mL/min (ref >60)
GLUCOSE: 257 mg/dL — AB (ref 65–99)
Potassium: 4.8 mmol/L (ref 3.5–5.1)
Sodium: 132 mmol/L — ABNORMAL LOW (ref 135–145)

## 2017-06-22 LAB — CBC WITH DIFFERENTIAL/PLATELET
Basophils Absolute: 0 10*3/uL (ref 0–0.1)
Basophils Relative: 1 %
EOS PCT: 4 %
Eosinophils Absolute: 0.2 10*3/uL (ref 0–0.7)
HEMATOCRIT: 27 % — AB (ref 40.0–52.0)
Hemoglobin: 9.3 g/dL — ABNORMAL LOW (ref 13.0–18.0)
LYMPHS ABS: 0.6 10*3/uL — AB (ref 1.0–3.6)
LYMPHS PCT: 10 %
MCH: 30.1 pg (ref 26.0–34.0)
MCHC: 34.3 g/dL (ref 32.0–36.0)
MCV: 87.7 fL (ref 80.0–100.0)
MONO ABS: 0.8 10*3/uL (ref 0.2–1.0)
Monocytes Relative: 13 %
Neutro Abs: 4.4 10*3/uL (ref 1.4–6.5)
Neutrophils Relative %: 72 %
Platelets: 230 10*3/uL (ref 150–440)
RBC: 3.08 MIL/uL — ABNORMAL LOW (ref 4.40–5.90)
RDW: 20.5 % — AB (ref 11.5–14.5)
WBC: 6.1 10*3/uL (ref 3.8–10.6)

## 2017-06-22 LAB — GLUCOSE, CAPILLARY
GLUCOSE-CAPILLARY: 303 mg/dL — AB (ref 65–99)
Glucose-Capillary: 271 mg/dL — ABNORMAL HIGH (ref 65–99)
Glucose-Capillary: 253 mg/dL — ABNORMAL HIGH (ref 65–99)
Glucose-Capillary: 253 mg/dL — ABNORMAL HIGH (ref 65–99)

## 2017-06-22 NOTE — Anesthesia Postprocedure Evaluation (Signed)
Anesthesia Post Note  Patient: Billy Liu  Procedure(s) Performed: ESOPHAGOGASTRODUODENOSCOPY (EGD) (N/A ) COLONOSCOPY (N/A )  Patient location during evaluation: PACU Anesthesia Type: General Level of consciousness: awake and alert Pain management: pain level controlled Vital Signs Assessment: post-procedure vital signs reviewed and stable Respiratory status: spontaneous breathing, nonlabored ventilation, respiratory function stable and patient connected to nasal cannula oxygen Cardiovascular status: blood pressure returned to baseline and stable Postop Assessment: no apparent nausea or vomiting Anesthetic complications: no     Last Vitals:  Vitals:   06/22/17 0516 06/22/17 0555  BP: (!) 181/77 (!) 159/58  Pulse: (!) 104 100  Resp: (!) 24   Temp: 36.5 C   SpO2: 97%     Last Pain:  Vitals:   06/22/17 0516  TempSrc: Oral  PainSc:                  [name]

## 2017-06-22 NOTE — Progress Notes (Signed)
[facility] - [place] at [facility]   PATIENT NAME: Billy Liu    MR#:  [id number]  DATE OF BIRTH:  1932-08-06  SUBJECTIVE:  CHIEF COMPLAINT:   Chief Complaint  Patient presents with  . Loss of Consciousness  . Fall  . Rectal Bleeding   No bleeding.  Confused.  REVIEW OF SYSTEMS:    Review of Systems  Unable to perform ROS: Dementia   DRUG ALLERGIES:   Allergies  Allergen Reactions  . Penicillins Other (See Comments)    Per MAR     VITALS:  Blood pressure (!) 159/58, pulse 100, temperature 97.7 F (36.5 C), temperature source Oral, resp. rate (!) 24, height 6\' 4"  (1.93 m), weight 123.5 kg (272 lb 4.3 oz), SpO2 97 %.  PHYSICAL EXAMINATION:   Physical Exam  GENERAL:  84 y.o.-year-old patient lying in the bed with no acute distress.  EYES: Pupils equal, round, reactive to light and accommodation. No scleral icterus. Extraocular muscles intact.  HEENT: Head atraumatic, normocephalic. Oropharynx and nasopharynx clear.  NECK:  Supple, no jugular venous distention. No thyroid enlargement, no tenderness.  LUNGS: Normal breath sounds bilaterally, no wheezing, rales, rhonchi. No use of accessory muscles of respiration.  CARDIOVASCULAR: S1, S2 normal. No murmurs, rubs, or gallops.  ABDOMEN: Soft, nontender, nondistended. Bowel sounds present. No organomegaly or mass.  EXTREMITIES: No cyanosis, clubbing or edema b/l.    NEUROLOGIC: Moves all 4 extremities PSYCHIATRIC: The patient is alert and awake. SKIN: No obvious rash, lesion, or ulcer.  LABORATORY PANEL:   CBC Recent Labs  Lab 06/22/17 0609  WBC 6.1  HGB 9.3*  HCT 27.0*  PLT 230   ------------------------------------------------------------------------------------------------------------------ Chemistries  Recent Labs  Lab 06/18/17 0753  06/22/17 0609  NA 133*   < > 132*  K 5.1   < > 4.8  CL 101   < > 100*  CO2 25   < > 26  GLUCOSE 88   < > 257*  BUN 63*   < > 17  CREATININE 1.48*    < > 1.05  CALCIUM 8.2*   < > 8.5*  AST 20  --   --   ALT 19  --   --   ALKPHOS 61  --   --   BILITOT 0.5  --   --    < > = values in this interval not displayed.   ------------------------------------------------------------------------------------------------------------------  Cardiac Enzymes Recent Labs  Lab 06/17/17 1532  TROPONINI <0.03   ------------------------------------------------------------------------------------------------------------------  RADIOLOGY:  Dg Chest Port 1 View  Result Date: 06/20/2017 CLINICAL DATA:  Aspiration EXAM: PORTABLE CHEST 1 VIEW COMPARISON:  June 17, 2017 FINDINGS: Mild cardiomegaly is stable. Increasing bilateral pulmonary opacities suggest edema, possibly with small layering effusions. No other acute abnormalities. IMPRESSION: Cardiomegaly and new pulmonary opacities, likely edema. Electronically Signed   By: Gerome Sam III M.D   On: 06/20/2017 20:48   ASSESSMENT AND PLAN:   * Acute hypoxic resp failure pulm edema on cxr.  Likely from blood transfusion Started Iv lasix Reviewed echo Continue IV diuresis 1 more day. Wean oxygen as tolerated  *Acute blood loss anemia due to bright red blood per rectum. No acute bleeding on EGD or colonoscopy- duodenal ulcer Transfused two units packed RBC. Hemoglobin 6.8->8.8->9  *Dementia. Monitor for inpatient delirium.  *COPD stable  *AKI over CKD III Improved  All the records are reviewed and case discussed with Care Management/Social Worker Management plans discussed with the patient, family (nephew at bedside)  and they are in agreement.  CODE STATUS: FULL CODE  DVT Prophylaxis: SCDs  TOTAL TIME TAKING CARE OF THIS PATIENT: 30 minutes.   POSSIBLE D/C IN 1-2 DAYS, DEPENDING ON CLINICAL CONDITION.  Orie FishermanSrikar R Raigen Jagielski M.D on 06/22/2017 at 11:57 AM  Between 7am to 6pm - Pager - [phone]  After 6pm go to [email or web address] - password [email or web address] [email or web address]  [facility] [place] [facility]  Office   [phone]  CC: Primary care physician; Keane PoliceSlade-Hartman, Venezela, MD  Note: This dictation was prepared with Dragon dictation along with smaller phrase technology. Any transcriptional errors that result from this process are unintentional.

## 2017-06-23 LAB — GLUCOSE, CAPILLARY
GLUCOSE-CAPILLARY: 277 mg/dL — AB (ref 65–99)
Glucose-Capillary: 301 mg/dL — ABNORMAL HIGH (ref 65–99)

## 2017-06-23 MED ORDER — PANTOPRAZOLE SODIUM 40 MG PO TBEC: 40.0000 mg | DELAYED_RELEASE_TABLET | Freq: Every day | ORAL | 0 refills | Status: DC

## 2017-06-23 MED ORDER — HYDROCHLOROTHIAZIDE 25 MG PO TABS
25.0000 mg | ORAL_TABLET | Freq: Every day | ORAL | Status: DC
  Administered 2017-06-23: 25 mg via ORAL
  Filled 2017-06-23: qty 1

## 2017-06-23 NOTE — Care Management Important Message (Signed)
Copy of signed IM left with patient in room.  

## 2017-06-23 NOTE — Discharge Summary (Signed)
[employer] - [facility]   PATIENT NAME: Billy Liu    MR#:  [id number]  DATE OF BIRTH:  01/30/1932  DATE OF ADMISSION:  06/17/2017 ADMITTING PHYSICIAN: [name], MD  DATE OF DISCHARGE: 06/23/2017  PRIMARY CARE PHYSICIAN: Keane Police, MD   ADMISSION DIAGNOSIS:  Lactic acidosis [E87.2] Hypothermia, initial encounter [T68.XXXA] Symptomatic anemia [D64.9] Syncope, unspecified syncope type [R55]  DISCHARGE DIAGNOSIS:  Active Problems:   Symptomatic anemia   Pressure injury of skin   Iron deficiency anemia due to chronic blood loss   SECONDARY DIAGNOSIS:   Past Medical History:  Diagnosis Date  . Anxiety   . Cellulitis 10/17/2014   left lower leg  . Chronic diastolic CHF (congestive heart failure) (HCC)   . CKD (chronic kidney disease), stage III (HCC)   . COPD (chronic obstructive pulmonary disease) (HCC)   . Coronary artery disease   . Dementia    Alzheimer's   . Depression   . Diabetes mellitus without complication (HCC)   . GERD (gastroesophageal reflux disease)   . Hyperlipemia   . Hypertension   . Obesity   . Renal disorder      ADMITTING HISTORY   HISTORY OF PRESENT ILLNESS:  Billy Liu  is a 84 y.o. male with a known history of angina, chronic kidney disease stage III, chronic diastolic congestive heart failure, hypertension, diabetes mellitus and multiple other medical problems has sustained a fall at the [facility] nursing home and also they have noticed some rectal bleeding and patient is sent over to the hospital.  Patient's hemoglobin is at 5.5 which was at 9 recently.  Fecal occult blood is positive in the emergency department also lactic acid is elevated.  Patient is a poor historian given the dementia.  Has chronic scrotal edema Patient's niece and nephew healthcare power of attorney are at bedside.  HOSPITAL COURSE:   * Acute hypoxic resp failure due to acute on chronic diastolic chf after  blood transfusion pulm edema on cxr.   Started Iv lasix Reviewed echo Diuresed well. Resolved  *Acute blood loss anemia due to bright red blood per rectum. No acute bleeding on EGD or colonoscopy- gastric ulcer and gastritis found on EGD Transfused two units packed RBC. Hemoglobin 6.8->8.8->9 Stable. No further bleeding Start PPI daily. Can resume ASA  *Dementia. Stable  *COPD stable  *AKI over CKD III Resolved  Stable for discharge back to ALF  CONSULTS OBTAINED:    DRUG ALLERGIES:   Allergies  Allergen Reactions  . Penicillins Other (See Comments)    Per MAR     DISCHARGE MEDICATIONS:   Allergies as of 06/23/2017      Reactions   Penicillins Other (See Comments)   Per MAR      Medication List    TAKE these medications   acetaminophen 500 MG tablet Commonly known as:  TYLENOL Take 500 mg by mouth 3 (three) times daily.   amLODipine 10 MG tablet Commonly known as:  NORVASC Take 10 mg by mouth daily.   aspirin EC 81 MG tablet Take 81 mg by mouth daily.   cloNIDine 0.2 MG tablet Commonly known as:  CATAPRES Take 1 tablet (0.2 mg total) by mouth 3 (three) times daily. What changed:  when to take this   divalproex 500 MG DR tablet Commonly known as:  DEPAKOTE Take 500 mg by mouth at bedtime.   divalproex 250 MG 24 hr tablet Commonly known as:  DEPAKOTE ER Take 250  mg by mouth every morning.   finasteride 5 MG tablet Commonly known as:  PROSCAR Take 1 tablet (5 mg total) by mouth daily.   GLUCAGEN DIAGNOSTIC 1 MG injection Generic drug:  glucagon (human recombinant) Inject 1 mg into the muscle once as needed for low blood sugar.   hydrALAZINE 100 MG tablet Commonly known as:  APRESOLINE Take 100 mg by mouth 3 (three) times daily.   insulin detemir 100 UNIT/ML injection Commonly known as:  LEVEMIR Inject 34 Units into the skin 2 (two) times daily.   insulin lispro 100 UNIT/ML injection Commonly known as:  HUMALOG Inject 18 units  under the skin with breakfast, 24 units with lunch, 12 units with supper   metoprolol succinate 100 MG 24 hr tablet Commonly known as:  TOPROL-XL Take 300 mg by mouth daily.   pantoprazole 40 MG tablet Commonly known as:  PROTONIX Take 1 tablet (40 mg total) by mouth daily.   polyethylene glycol packet Commonly known as:  MIRALAX / GLYCOLAX Take 17 g by mouth every morning.   senna 8.6 MG Tabs tablet Commonly known as:  SENOKOT Take 2 tablets by mouth at bedtime.   sertraline 25 MG tablet Commonly known as:  ZOLOFT Take 75 mg by mouth daily.   simvastatin 20 MG tablet Commonly known as:  ZOCOR Take 30 mg by mouth at bedtime.   torsemide 20 MG tablet Commonly known as:  DEMADEX Take 2 tablets (40 mg total) by mouth daily. What changed:  how much to take   Vitamin D (Ergocalciferol) 50000 units Caps capsule Commonly known as:  DRISDOL Take 50,000 Units by mouth every 30 (thirty) days.       Today   VITAL SIGNS:  Blood pressure (!) 163/62, pulse 84, temperature (!) 97.5 F (36.4 C), temperature source Oral, resp. rate (!) 24, height 6\' 4"  (1.93 m), weight 123.5 kg (272 lb 4.3 oz), SpO2 98 %.  I/O:    Intake/Output Summary (Last 24 hours) at 06/23/2017 1019 Last data filed at 06/23/2017 0955 Gross per 24 hour  Intake 360 ml  Output -  Net 360 ml    PHYSICAL EXAMINATION:  Physical Exam  GENERAL:  84 y.o.-year-old patient lying in the bed with no acute distress.  LUNGS: Normal breath sounds bilaterally, no wheezing, rales,rhonchi or crepitation. No use of accessory muscles of respiration.  CARDIOVASCULAR: S1, S2 normal. No murmurs, rubs, or gallops.  ABDOMEN: Soft, non-tender, non-distended. Bowel sounds present. No organomegaly or mass.  PSYCHIATRIC: The patient is alert and awake SKIN: No obvious rash, lesion, or ulcer.   DATA REVIEW:   CBC Recent Labs  Lab 06/22/17 0609  WBC 6.1  HGB 9.3*  HCT 27.0*  PLT 230    Chemistries  Recent Labs  Lab  06/18/17 0753  06/22/17 0609  NA 133*   < > 132*  K 5.1   < > 4.8  CL 101   < > 100*  CO2 25   < > 26  GLUCOSE 88   < > 257*  BUN 63*   < > 17  CREATININE 1.48*   < > 1.05  CALCIUM 8.2*   < > 8.5*  AST 20  --   --   ALT 19  --   --   ALKPHOS 61  --   --   BILITOT 0.5  --   --    < > = values in this interval not displayed.    Cardiac Enzymes Recent Labs  Lab 06/17/17 1532  TROPONINI <0.03    Microbiology Results  Results for orders placed or performed during the hospital encounter of 06/17/17  Blood Culture (routine x 2)     Status: None   Collection Time: 06/17/17  5:21 PM  Result Value Ref Range Status   Specimen Description BLOOD LEFT ANTECUBITAL  Final   Special Requests   Final    BOTTLES DRAWN AEROBIC AND ANAEROBIC Blood Culture adequate volume   Culture   Final    NO GROWTH 5 DAYS Performed at [facility], [address], [place], [place] [address]    Report Status 06/22/2017 FINAL  Final  Blood Culture (routine x 2)     Status: None   Collection Time: 06/17/17  5:42 PM  Result Value Ref Range Status   Specimen Description BLOOD RIGHT ANTECUBITAL  Final   Special Requests   Final    BOTTLES DRAWN AEROBIC AND ANAEROBIC Blood Culture results may not be optimal due to an inadequate volume of blood received in culture bottles   Culture   Final    NO GROWTH 5 DAYS Performed at [facility], [address], [place], [place] [address]    Report Status 06/22/2017 FINAL  Final  Aerobic/Anaerobic Culture (surgical/deep wound)     Status: Abnormal   Collection Time: 06/17/17  6:10 PM  Result Value Ref Range Status   Specimen Description   Final    LEG RIGHT LEG Performed at [facility], [address], [place], [place] [address]    Special Requests   Final    NONE Performed at [facility], [address] [place], [place] [address]    Gram Stain   Final    ABUNDANT WBC PRESENT, PREDOMINANTLY PMN ABUNDANT GRAM  POSITIVE COCCI MODERATE GRAM NEGATIVE RODS MODERATE GRAM POSITIVE RODS Performed at [facility], [address], [place], [place] [address]    Culture (A)  Final    MULTIPLE ORGANISMS PRESENT, NONE PREDOMINANT MIXED ANAEROBIC FLORA PRESENT.  CALL LAB IF FURTHER IID REQUIRED.    Report Status 06/20/2017 FINAL  Final  MRSA PCR Screening     Status: None   Collection Time: 06/17/17  7:08 PM  Result Value Ref Range Status   MRSA by PCR NEGATIVE NEGATIVE Final    Comment:        The GeneXpert MRSA Assay (FDA approved for NASAL specimens only), is one component of a comprehensive MRSA colonization surveillance program. It is not intended to diagnose MRSA infection nor to guide or monitor treatment for MRSA infections. Performed at [facility], [address], [place], [place] [address]   Urine culture     Status: Abnormal   Collection Time: 06/17/17  9:46 PM  Result Value Ref Range Status   Specimen Description   Final    URINE, RANDOM Performed at [facility], [address] [place], [place] [address]    Special Requests   Final    NONE Performed at [facility], [address] [place], [place] [address]    Culture 80,000 COLONIES/mL ESCHERICHIA COLI (A)  Final   Report Status 06/20/2017 FINAL  Final   Organism ID, Bacteria ESCHERICHIA COLI (A)  Final      Susceptibility   Escherichia coli - MIC*    AMPICILLIN >=32 RESISTANT Resistant     CEFAZOLIN >=64 RESISTANT Resistant     CEFTRIAXONE <=1 SENSITIVE Sensitive     CIPROFLOXACIN <=0.25 SENSITIVE  Sensitive     GENTAMICIN <=1 SENSITIVE Sensitive     IMIPENEM <=0.25 SENSITIVE Sensitive     NITROFURANTOIN <=16 SENSITIVE Sensitive     TRIMETH/SULFA <=20 SENSITIVE Sensitive     AMPICILLIN/SULBACTAM 16 INTERMEDIATE Intermediate     PIP/TAZO <=4 SENSITIVE Sensitive     Extended ESBL NEGATIVE Sensitive     * 80,000 COLONIES/mL ESCHERICHIA COLI    RADIOLOGY:  No results  found.  Follow up with PCP in 1 week.  Management plans discussed with the patient, family and they are in agreement.  CODE STATUS:     Code Status Orders  (From admission, onward)        Start     Ordered   06/17/17 1857  Full code  Continuous     06/17/17 1856    Code Status History    Date Active Date Inactive Code Status Order ID Comments User Context   02/18/2017 1623 02/18/2017 2133 Full Code [id number]  Renford Dills, MD Inpatient   02/11/2017 1138 02/11/2017 1736 Full Code [id number]  Renford Dills, MD Inpatient   10/24/2014 1934 10/31/2014 1837 Full Code [id number]  Enedina Finner, MD Inpatient   10/11/2014 2347 10/17/2014 1844 Full Code [id number]  Oralia Manis, MD Inpatient      TOTAL TIME TAKING CARE OF THIS PATIENT ON DAY OF DISCHARGE: more than 30 minutes.   Orie Fisherman M.D on 06/23/2017 at 10:19 AM  Between 7am to 6pm - Pager - [phone]  After 6pm go to [email or web address] - password [email or web address] [facility]  [facility]  Office  [phone]  CC: Primary care physician; Keane Police, MD  Note: This dictation was prepared with Dragon dictation along with smaller phrase technology. Any transcriptional errors that result from this process are unintentional.

## 2017-06-23 NOTE — Progress Notes (Signed)
[facility] IV's removed. Belongings gathered. Called EMS for non-emergent transport. Family in room and notified.

## 2017-06-23 NOTE — Clinical Social Work Note (Signed)
Patient discharging today [facility]. Discharge information has been sent and Stanton KidneyDebra at [facility] is aware. Nurse is aware to call family to notify of discharge. York SpanielMonica Valor Quaintance MSW,LCSW [phone]

## 2017-06-24 LAB — SURGICAL PATHOLOGY

## 2017-06-25 ENCOUNTER — Other Ambulatory Visit: Payer: Self-pay

## 2017-06-25 ENCOUNTER — Encounter: Payer: Self-pay | Admitting: Gastroenterology

## 2017-06-25 MED ORDER — METRONIDAZOLE 500 MG PO TABS: 500.0000 mg | ORAL_TABLET | Freq: Two times a day (BID) | ORAL | 0 refills | Status: AC

## 2017-06-25 MED ORDER — CLARITHROMYCIN 250 MG PO TABS: 250.0000 mg | ORAL_TABLET | Freq: Two times a day (BID) | ORAL | 0 refills | Status: AC

## 2017-06-25 MED ORDER — OMEPRAZOLE 40 MG PO CPDR: 40.0000 mg | DELAYED_RELEASE_CAPSULE | Freq: Two times a day (BID) | ORAL | 0 refills | Status: DC

## 2017-08-12 ENCOUNTER — Other Ambulatory Visit: Payer: Self-pay

## 2017-08-12 ENCOUNTER — Encounter: Payer: Self-pay | Admitting: Gastroenterology

## 2017-08-12 ENCOUNTER — Encounter

## 2017-08-12 ENCOUNTER — Ambulatory Visit (INDEPENDENT_AMBULATORY_CARE_PROVIDER_SITE_OTHER): Payer: Medicare Other | Admitting: Gastroenterology

## 2017-08-12 VITALS — BP 158/63 | HR 60 | Resp 17 | Ht 71.0 in | Wt 262.0 lb

## 2017-08-12 DIAGNOSIS — A048 Other specified bacterial intestinal infections: Secondary | ICD-10-CM

## 2017-08-12 DIAGNOSIS — K279 Peptic ulcer, site unspecified, unspecified as acute or chronic, without hemorrhage or perforation: Secondary | ICD-10-CM

## 2017-08-12 DIAGNOSIS — D500 Iron deficiency anemia secondary to blood loss (chronic): Secondary | ICD-10-CM | POA: Diagnosis not present

## 2017-08-12 NOTE — Progress Notes (Signed)
Arlyss Repress, MD [address]  [address]  [place], [place] [address]  Main: [phone]  Fax: [phone]    Gastroenterology Consultation  Referring Provider:     Keane Police* Primary Care Physician:  Keane Police, MD Primary Gastroenterologist:  Dr. Arlyss Repress Reason for Consultation:     Peptic ulcer disease, H. pylori infection, iron deficiency anemia        HPI:   Billy Liu is a 84 y.o. male referred by Dr. Keane Police, MD  for consultation & management of iron deficiency anemia. He has multiple comorbidities including dementia, chronic kidney disease stage III, congestive heart failure, hypertension, diabetes. He was admitted to [facility] in 06/2017 secondary to severe symptomatic anemia and rectal bleeding. He responded appropriately to blood transfusions. He did have severe iron deficiency for which he received IV iron during the hospital stay. He underwent upper endoscopy which revealed clean-based ulcers in the stomach and biopsies positive for H. pylori. We prescribed him 2 weeks course of triple therapy. Could not perform colonoscopy as patient did not prep.   Follow-up visit 08/12/2017: He is accompanied by his brother from nursing home. Patient did not have any further episodes of rectal bleeding since hospital discharge. He denies abdominal pain, nausea or vomiting. He is currently on Protonix once daily.  NSAIDs: None  Antiplts/Anticoagulants/Anti thrombotics: None  GI Procedures: EGD 06/20/2017  - Normal duodenal bulb and second portion of the duodenum. - Non-bleeding gastric ulcers with a clean ulcer base (Forrest Class III). - Erythematous mucosa in the stomach. Biopsied. - Normal gastroesophageal junction and esophagus. DIAGNOSIS:  A. STOMACH; COLD BIOPSY:  - ANTRAL MUCOSA WITH MODERATE CHRONIC ACTIVE HELICOBACTER ASSOCIATED  GASTRITIS.  - NEGATIVE FOR DYSPLASIA AND MALIGNANCY.    Colonoscopy 06/20/2017 - Preparation of the colon was poor. - Thick brown stool found on digital rectal exam. - Stool in the rectum, in the sigmoid colon and in the descending colon. - No specimens collected.  Past Medical History:  Diagnosis Date  . Anxiety   . Cellulitis 10/17/2014   left lower leg  . Chronic diastolic CHF (congestive heart failure) (HCC)   . CKD (chronic kidney disease), stage III (HCC)   . COPD (chronic obstructive pulmonary disease) (HCC)   . Coronary artery disease   . Dementia    Alzheimer's   . Depression   . Diabetes mellitus without complication (HCC)   . GERD (gastroesophageal reflux disease)   . Hyperlipemia   . Hypertension   . Obesity   . Renal disorder     Past Surgical History:  Procedure Laterality Date  . COLONOSCOPY N/A 06/20/2017   Procedure: COLONOSCOPY;  Surgeon: Toney Reil, MD;  Location: [facility] ENDOSCOPY;  Service: Gastroenterology;  Laterality: N/A;  . ESOPHAGOGASTRODUODENOSCOPY N/A 06/20/2017   Procedure: ESOPHAGOGASTRODUODENOSCOPY (EGD);  Surgeon: Toney Reil, MD;  Location: [facility] ENDOSCOPY;  Service: Gastroenterology;  Laterality: N/A;  . LOWER EXTREMITY ANGIOGRAPHY Left 02/11/2017   Procedure: LOWER EXTREMITY ANGIOGRAPHY;  Surgeon: Renford Dills, MD;  Location: [facility] INVASIVE CV LAB;  Service: Cardiovascular;  Laterality: Left;  . LOWER EXTREMITY ANGIOGRAPHY Left 03/11/2017   Procedure: LOWER EXTREMITY ANGIOGRAPHY;  Surgeon: Renford Dills, MD;  Location: [facility] INVASIVE CV LAB;  Service: Cardiovascular;  Laterality: Left;  . LOWER EXTREMITY INTERVENTION  03/11/2017   Procedure: LOWER EXTREMITY INTERVENTION;  Surgeon: Renford Dills, MD;  Location: [facility] INVASIVE CV LAB;  Service: Cardiovascular;;  . PERIPHERAL VASCULAR ATHERECTOMY Left 02/18/2017  Procedure: PERIPHERAL VASCULAR ATHERECTOMY;  Surgeon: Renford Dills, MD;  Location: [facility] INVASIVE CV LAB;  Service: Cardiovascular;  Laterality: Left;     Current Outpatient Medications:  .  acetaminophen (TYLENOL) 500 MG tablet, Take 500 mg by mouth 3 (three) times daily., Disp: , Rfl:  .  amLODipine (NORVASC) 10 MG tablet, Take 10 mg by mouth daily., Disp: , Rfl:  .  aspirin EC 81 MG tablet, Take 81 mg by mouth daily., Disp: , Rfl:  .  cloNIDine (CATAPRES) 0.2 MG tablet, Take 1 tablet (0.2 mg total) by mouth 3 (three) times daily. (Patient taking differently: Take 0.2 mg by mouth 2 (two) times daily. ), Disp: 60 tablet, Rfl: 11 .  divalproex (DEPAKOTE ER) 250 MG 24 hr tablet, Take 250 mg by mouth every morning. , Disp: , Rfl:  .  divalproex (DEPAKOTE ER) 500 MG 24 hr tablet, , Disp: , Rfl:  .  finasteride (PROSCAR) 5 MG tablet, Take 1 tablet (5 mg total) by mouth daily., Disp: , Rfl:  .  GLUCAGEN HYPOKIT 1 MG SOLR injection, , Disp: , Rfl:  .  glucagon, human recombinant, (GLUCAGEN DIAGNOSTIC) 1 MG injection, Inject 1 mg into the muscle once as needed for low blood sugar., Disp: , Rfl:  .  hydrALAZINE (APRESOLINE) 100 MG tablet, Take 100 mg by mouth 3 (three) times daily., Disp: , Rfl:  .  insulin detemir (LEVEMIR) 100 UNIT/ML injection, Inject 34 Units into the skin 2 (two) times daily. , Disp: , Rfl:  .  insulin lispro (HUMALOG) 100 UNIT/ML injection, Inject 18 units under the skin with breakfast, 24 units with lunch, 12 units with supper, Disp: , Rfl:  .  metoprolol succinate (TOPROL-XL) 100 MG 24 hr tablet, Take 300 mg by mouth daily. , Disp: , Rfl:  .  pantoprazole (PROTONIX) 40 MG tablet, Take 1 tablet (40 mg total) by mouth daily., Disp: 30 tablet, Rfl: 0 .  polyethylene glycol (MIRALAX / GLYCOLAX) packet, Take 17 g by mouth every morning. , Disp: , Rfl:  .  senna (SENOKOT) 8.6 MG TABS tablet, Take 2 tablets by mouth at bedtime., Disp: , Rfl:  .  sertraline (ZOLOFT) 25 MG tablet, Take 75 mg by mouth daily. , Disp: , Rfl:  .  sertraline (ZOLOFT) 50 MG tablet, , Disp: , Rfl:  .  simvastatin (ZOCOR) 20 MG tablet, Take 30 mg by mouth at  bedtime. , Disp: , Rfl:  .  torsemide (DEMADEX) 20 MG tablet, Take 2 tablets (40 mg total) by mouth daily. (Patient taking differently: Take 100 mg by mouth daily. ), Disp: , Rfl:  .  Vitamin D, Ergocalciferol, (DRISDOL) 50000 UNITS CAPS capsule, Take 50,000 Units by mouth every 30 (thirty) days. , Disp: , Rfl:  .  divalproex (DEPAKOTE) 250 MG DR tablet, , Disp: , Rfl:  .  divalproex (DEPAKOTE) 500 MG DR tablet, Take 500 mg by mouth at bedtime. , Disp: , Rfl:  .  omeprazole (PRILOSEC) 40 MG capsule, Take 1 capsule (40 mg total) by mouth 2 (two) times daily for 14 days., Disp: 28 capsule, Rfl: 0 .  simvastatin (ZOCOR) 10 MG tablet, , Disp: , Rfl:   Family History  Problem Relation Age of Onset  . Cancer Brother      Social History   Tobacco Use  . Smoking status: Never Smoker  . Smokeless tobacco: Former Neurosurgeon    Types: Chew  Substance Use Topics  . Alcohol use: No  . Drug use:  No    Allergies as of 08/12/2017 - Review Complete 08/12/2017  Allergen Reaction Noted  . Penicillins Other (See Comments) 10/11/2014    Review of Systems:    All systems reviewed and negative except where noted in HPI.   Physical Exam:  BP (!) 158/63 (BP Location: Left Arm, Patient Position: Sitting, Cuff Size: Large)   Pulse 60   Resp 17   Ht 5\' 11"  (1.803 m)   Wt 262 lb (118.8 kg)   BMI 36.54 kg/m  No LMP for male patient.  General:   Alert,  Well-developed, well-nourished, pleasant and cooperative in NAD Head:  Normocephalic and atraumatic. Eyes:  Sclera clear, no icterus.   Conjunctiva pink. Ears:  Normal auditory acuity. Nose:  No deformity, discharge, or lesions. Mouth:  No deformity or lesions,oropharynx pink & moist. Neck:  Supple; no masses or thyromegaly. Lungs:  Respirations even and unlabored.  Clear throughout to auscultation.   No wheezes, crackles, or rhonchi. No acute distress. Heart:  Regular rate and rhythm; no murmurs, clicks, rubs, or gallops. Abdomen:  Normal bowel sounds.  Soft, non-tender and non-distended without masses, hepatosplenomegaly or hernias noted.  No guarding or rebound tenderness.   Rectal: Not performed Msk:  Symmetrical without gross deformities. Good, equal movement & strength bilaterally. Pulses:  Normal pulses noted. Extremities:  No clubbing or edema.  No cyanosis. Neurologic:  Alert and oriented x3;  grossly normal neurologically. Skin:  Intact without significant lesions or rashes. No jaundice. Lymph Nodes:  No significant cervical adenopathy. Psych:  Alert and cooperative. Normal mood and affect.  Imaging Studies: No abdominal imaging   Assessment and Plan:   Billy Liu is a 84 y.o. African-American  male with history of dementia, chronic kidney disease stage III, congestive heart failure, hypertension, diabetes who lives in nursing home, recent hospitalization for iron deficiency anemia and rectal bleeding status post EGD which revealed H. pylori gastritis and clean-based gastric ulcers status post triple therapy. The rectal bleeding episode was self-limited. Patient received parenteral iron in the hospital and currently on oral iron daily   - Recheck CBC, ferritin, iron and TIBC, H. pylori breath test - Continue oral iron daily - I will defer endoscopic evaluation at this time unless his anemia does not respond to iron therapy  Follow up in 3 months    Arlyss Repressohini R Vanga, MD

## 2017-08-14 ENCOUNTER — Other Ambulatory Visit
Admission: RE | Admit: 2017-08-14 | Discharge: 2017-08-14 | Disposition: A | Discharge status: Home / Self Care | Payer: Medicare Other | Source: Ambulatory Visit | Attending: Gastroenterology | Admitting: Gastroenterology

## 2017-08-14 DIAGNOSIS — D500 Iron deficiency anemia secondary to blood loss (chronic): Secondary | ICD-10-CM | POA: Diagnosis present

## 2017-08-14 LAB — CBC
HCT: 25.5 % — ABNORMAL LOW (ref 40.0–52.0)
Hemoglobin: 8.4 g/dL — ABNORMAL LOW (ref 13.0–18.0)
MCH: 29.1 pg (ref 26.0–34.0)
MCHC: 33.2 g/dL (ref 32.0–36.0)
MCV: 87.9 fL (ref 80.0–100.0)
PLATELETS: 326 10*3/uL (ref 150–440)
RBC: 2.9 MIL/uL — ABNORMAL LOW (ref 4.40–5.90)
RDW: 18.9 % — ABNORMAL HIGH (ref 11.5–14.5)
WBC: 9.1 10*3/uL (ref 3.8–10.6)

## 2017-08-14 LAB — IRON AND TIBC
IRON: 60 ug/dL (ref 45–182)
SATURATION RATIOS: 17 % — AB (ref 17.9–39.5)
TIBC: 353 ug/dL (ref 250–450)
UIBC: 293 ug/dL

## 2017-08-14 LAB — FERRITIN: FERRITIN: 39 ng/mL (ref 24–336)

## 2017-08-15 ENCOUNTER — Other Ambulatory Visit: Payer: Self-pay

## 2017-08-15 DIAGNOSIS — D500 Iron deficiency anemia secondary to blood loss (chronic): Secondary | ICD-10-CM

## 2017-08-15 LAB — H. PYLORI BREATH TEST: H. PYLORI UBIT: NEGATIVE

## 2017-10-06 ENCOUNTER — Encounter: Payer: Self-pay | Admitting: *Deleted

## 2017-10-06 ENCOUNTER — Emergency Department: Payer: Medicare Other

## 2017-10-06 ENCOUNTER — Inpatient Hospital Stay
Admission: EM | Admit: 2017-10-06 | Discharge: 2017-10-27 | DRG: 004 | Disposition: A | Discharge status: Other Acute Inpatient Hospital | Payer: Medicare Other | Source: Skilled Nursing Facility | Attending: Internal Medicine | Admitting: Internal Medicine

## 2017-10-06 DIAGNOSIS — R140 Abdominal distension (gaseous): Secondary | ICD-10-CM

## 2017-10-06 DIAGNOSIS — Z01818 Encounter for other preprocedural examination: Secondary | ICD-10-CM

## 2017-10-06 DIAGNOSIS — E1122 Type 2 diabetes mellitus with diabetic chronic kidney disease: Secondary | ICD-10-CM | POA: Diagnosis present

## 2017-10-06 DIAGNOSIS — G9389 Other specified disorders of brain: Secondary | ICD-10-CM | POA: Diagnosis present

## 2017-10-06 DIAGNOSIS — R609 Edema, unspecified: Secondary | ICD-10-CM

## 2017-10-06 DIAGNOSIS — I96 Gangrene, not elsewhere classified: Secondary | ICD-10-CM

## 2017-10-06 DIAGNOSIS — E785 Hyperlipidemia, unspecified: Secondary | ICD-10-CM | POA: Diagnosis present

## 2017-10-06 DIAGNOSIS — Z1389 Encounter for screening for other disorder: Secondary | ICD-10-CM

## 2017-10-06 DIAGNOSIS — J189 Pneumonia, unspecified organism: Secondary | ICD-10-CM

## 2017-10-06 DIAGNOSIS — L0889 Other specified local infections of the skin and subcutaneous tissue: Secondary | ICD-10-CM | POA: Diagnosis present

## 2017-10-06 DIAGNOSIS — Z9911 Dependence on respirator [ventilator] status: Secondary | ICD-10-CM

## 2017-10-06 DIAGNOSIS — I872 Venous insufficiency (chronic) (peripheral): Secondary | ICD-10-CM | POA: Diagnosis present

## 2017-10-06 DIAGNOSIS — R1310 Dysphagia, unspecified: Secondary | ICD-10-CM

## 2017-10-06 DIAGNOSIS — G4733 Obstructive sleep apnea (adult) (pediatric): Secondary | ICD-10-CM | POA: Diagnosis present

## 2017-10-06 DIAGNOSIS — I509 Heart failure, unspecified: Secondary | ICD-10-CM

## 2017-10-06 DIAGNOSIS — E870 Hyperosmolality and hypernatremia: Secondary | ICD-10-CM | POA: Diagnosis not present

## 2017-10-06 DIAGNOSIS — F0280 Dementia in other diseases classified elsewhere without behavioral disturbance: Secondary | ICD-10-CM | POA: Diagnosis present

## 2017-10-06 DIAGNOSIS — L97529 Non-pressure chronic ulcer of other part of left foot with unspecified severity: Secondary | ICD-10-CM | POA: Diagnosis present

## 2017-10-06 DIAGNOSIS — T17908A Unspecified foreign body in respiratory tract, part unspecified causing other injury, initial encounter: Secondary | ICD-10-CM

## 2017-10-06 DIAGNOSIS — K219 Gastro-esophageal reflux disease without esophagitis: Secondary | ICD-10-CM | POA: Diagnosis present

## 2017-10-06 DIAGNOSIS — I2510 Atherosclerotic heart disease of native coronary artery without angina pectoris: Secondary | ICD-10-CM | POA: Diagnosis present

## 2017-10-06 DIAGNOSIS — J181 Lobar pneumonia, unspecified organism: Secondary | ICD-10-CM | POA: Diagnosis present

## 2017-10-06 DIAGNOSIS — Z4659 Encounter for fitting and adjustment of other gastrointestinal appliance and device: Secondary | ICD-10-CM

## 2017-10-06 DIAGNOSIS — Z6832 Body mass index (BMI) 32.0-32.9, adult: Secondary | ICD-10-CM

## 2017-10-06 DIAGNOSIS — Z8673 Personal history of transient ischemic attack (TIA), and cerebral infarction without residual deficits: Secondary | ICD-10-CM

## 2017-10-06 DIAGNOSIS — E875 Hyperkalemia: Secondary | ICD-10-CM

## 2017-10-06 DIAGNOSIS — N179 Acute kidney failure, unspecified: Secondary | ICD-10-CM | POA: Diagnosis present

## 2017-10-06 DIAGNOSIS — N4883 Acquired buried penis: Secondary | ICD-10-CM | POA: Diagnosis not present

## 2017-10-06 DIAGNOSIS — I130 Hypertensive heart and chronic kidney disease with heart failure and stage 1 through stage 4 chronic kidney disease, or unspecified chronic kidney disease: Secondary | ICD-10-CM | POA: Diagnosis present

## 2017-10-06 DIAGNOSIS — J9601 Acute respiratory failure with hypoxia: Secondary | ICD-10-CM | POA: Diagnosis not present

## 2017-10-06 DIAGNOSIS — Z7982 Long term (current) use of aspirin: Secondary | ICD-10-CM

## 2017-10-06 DIAGNOSIS — Z87891 Personal history of nicotine dependence: Secondary | ICD-10-CM

## 2017-10-06 DIAGNOSIS — D500 Iron deficiency anemia secondary to blood loss (chronic): Secondary | ICD-10-CM | POA: Diagnosis present

## 2017-10-06 DIAGNOSIS — J9600 Acute respiratory failure, unspecified whether with hypoxia or hypercapnia: Secondary | ICD-10-CM

## 2017-10-06 DIAGNOSIS — G309 Alzheimer's disease, unspecified: Secondary | ICD-10-CM | POA: Diagnosis present

## 2017-10-06 DIAGNOSIS — Z0189 Encounter for other specified special examinations: Secondary | ICD-10-CM

## 2017-10-06 DIAGNOSIS — N433 Hydrocele, unspecified: Secondary | ICD-10-CM | POA: Diagnosis present

## 2017-10-06 DIAGNOSIS — N183 Chronic kidney disease, stage 3 (moderate): Secondary | ICD-10-CM | POA: Diagnosis present

## 2017-10-06 DIAGNOSIS — F419 Anxiety disorder, unspecified: Secondary | ICD-10-CM | POA: Diagnosis present

## 2017-10-06 DIAGNOSIS — Z515 Encounter for palliative care: Secondary | ICD-10-CM | POA: Diagnosis not present

## 2017-10-06 DIAGNOSIS — Z466 Encounter for fitting and adjustment of urinary device: Secondary | ICD-10-CM | POA: Diagnosis not present

## 2017-10-06 DIAGNOSIS — Z794 Long term (current) use of insulin: Secondary | ICD-10-CM

## 2017-10-06 DIAGNOSIS — G9349 Other encephalopathy: Secondary | ICD-10-CM | POA: Diagnosis present

## 2017-10-06 DIAGNOSIS — I469 Cardiac arrest, cause unspecified: Secondary | ICD-10-CM | POA: Diagnosis not present

## 2017-10-06 DIAGNOSIS — J440 Chronic obstructive pulmonary disease with acute lower respiratory infection: Secondary | ICD-10-CM | POA: Diagnosis present

## 2017-10-06 DIAGNOSIS — E669 Obesity, unspecified: Secondary | ICD-10-CM | POA: Diagnosis present

## 2017-10-06 DIAGNOSIS — Z978 Presence of other specified devices: Secondary | ICD-10-CM

## 2017-10-06 DIAGNOSIS — E11622 Type 2 diabetes mellitus with other skin ulcer: Secondary | ICD-10-CM | POA: Diagnosis present

## 2017-10-06 DIAGNOSIS — E872 Acidosis: Secondary | ICD-10-CM | POA: Diagnosis present

## 2017-10-06 DIAGNOSIS — E1165 Type 2 diabetes mellitus with hyperglycemia: Secondary | ICD-10-CM | POA: Diagnosis present

## 2017-10-06 DIAGNOSIS — K5900 Constipation, unspecified: Secondary | ICD-10-CM | POA: Diagnosis not present

## 2017-10-06 DIAGNOSIS — Y95 Nosocomial condition: Secondary | ICD-10-CM | POA: Diagnosis present

## 2017-10-06 DIAGNOSIS — L97319 Non-pressure chronic ulcer of right ankle with unspecified severity: Secondary | ICD-10-CM | POA: Diagnosis present

## 2017-10-06 DIAGNOSIS — E11621 Type 2 diabetes mellitus with foot ulcer: Secondary | ICD-10-CM | POA: Diagnosis present

## 2017-10-06 DIAGNOSIS — I5033 Acute on chronic diastolic (congestive) heart failure: Secondary | ICD-10-CM | POA: Diagnosis present

## 2017-10-06 DIAGNOSIS — Z781 Physical restraint status: Secondary | ICD-10-CM

## 2017-10-06 DIAGNOSIS — N471 Phimosis: Secondary | ICD-10-CM | POA: Diagnosis present

## 2017-10-06 DIAGNOSIS — J9602 Acute respiratory failure with hypercapnia: Secondary | ICD-10-CM | POA: Diagnosis present

## 2017-10-06 DIAGNOSIS — Q5564 Hidden penis: Secondary | ICD-10-CM

## 2017-10-06 DIAGNOSIS — F329 Major depressive disorder, single episode, unspecified: Secondary | ICD-10-CM | POA: Diagnosis present

## 2017-10-06 DIAGNOSIS — Z880 Allergy status to penicillin: Secondary | ICD-10-CM

## 2017-10-06 DIAGNOSIS — I440 Atrioventricular block, first degree: Secondary | ICD-10-CM | POA: Diagnosis present

## 2017-10-06 DIAGNOSIS — B9562 Methicillin resistant Staphylococcus aureus infection as the cause of diseases classified elsewhere: Secondary | ICD-10-CM | POA: Diagnosis present

## 2017-10-06 DIAGNOSIS — I959 Hypotension, unspecified: Secondary | ICD-10-CM | POA: Diagnosis not present

## 2017-10-06 DIAGNOSIS — R0602 Shortness of breath: Secondary | ICD-10-CM

## 2017-10-06 DIAGNOSIS — R402432 Glasgow coma scale score 3-8, at arrival to emergency department: Secondary | ICD-10-CM | POA: Diagnosis present

## 2017-10-06 DIAGNOSIS — Z79899 Other long term (current) drug therapy: Secondary | ICD-10-CM

## 2017-10-06 DIAGNOSIS — T884XXA Failed or difficult intubation, initial encounter: Secondary | ICD-10-CM | POA: Diagnosis not present

## 2017-10-06 DIAGNOSIS — L97219 Non-pressure chronic ulcer of right calf with unspecified severity: Secondary | ICD-10-CM | POA: Diagnosis present

## 2017-10-06 LAB — COMPREHENSIVE METABOLIC PANEL
ALK PHOS: 91 U/L (ref 38–126)
ALT: 25 U/L (ref 0–44)
ANION GAP: 12 (ref 5–15)
AST: 51 U/L — ABNORMAL HIGH (ref 15–41)
Albumin: 3.5 g/dL (ref 3.5–5.0)
BUN: 62 mg/dL — ABNORMAL HIGH (ref 8–23)
CALCIUM: 8.4 mg/dL — AB (ref 8.9–10.3)
CO2: 22 mmol/L (ref 22–32)
Chloride: 98 mmol/L (ref 98–111)
Creatinine, Ser: 1.98 mg/dL — ABNORMAL HIGH (ref 0.61–1.24)
GFR, EST AFRICAN AMERICAN: 34 mL/min — AB (ref >60)
GFR, EST NON AFRICAN AMERICAN: 30 mL/min — AB (ref >60)
GLUCOSE: 210 mg/dL — AB (ref 70–99)
POTASSIUM: 6.3 mmol/L — AB (ref 3.5–5.1)
Sodium: 132 mmol/L — ABNORMAL LOW (ref 135–145)
TOTAL PROTEIN: 7 g/dL (ref 6.5–8.1)
Total Bilirubin: 0.5 mg/dL (ref 0.3–1.2)

## 2017-10-06 LAB — CBC WITH DIFFERENTIAL/PLATELET
BASOS PCT: 0 %
Band Neutrophils: 0 %
Basophils Absolute: 0 10*3/uL (ref 0–0.1)
Blasts: 0 %
EOS PCT: 1 %
Eosinophils Absolute: 0.1 10*3/uL (ref 0–0.7)
HCT: 20.4 % — ABNORMAL LOW (ref 40.0–52.0)
HEMOGLOBIN: 6.3 g/dL — AB (ref 13.0–18.0)
LYMPHS ABS: 2.5 10*3/uL (ref 1.0–3.6)
LYMPHS PCT: 27 %
MCH: 26.3 pg (ref 26.0–34.0)
MCHC: 30.7 g/dL — AB (ref 32.0–36.0)
MCV: 85.6 fL (ref 80.0–100.0)
Metamyelocytes Relative: 1 %
Monocytes Absolute: 0.5 10*3/uL (ref 0.2–1.0)
Monocytes Relative: 5 %
Myelocytes: 1 %
Neutro Abs: 6.3 10*3/uL (ref 1.4–6.5)
Neutrophils Relative %: 65 %
OTHER: 0 %
PLATELETS: 333 10*3/uL (ref 150–440)
Promyelocytes Relative: 0 %
RBC: 2.39 MIL/uL — AB (ref 4.40–5.90)
RDW: 18.6 % — ABNORMAL HIGH (ref 11.5–14.5)
WBC: 9.4 10*3/uL (ref 4.0–10.5)
nRBC: 3 /100 WBC — ABNORMAL HIGH

## 2017-10-06 LAB — TROPONIN I

## 2017-10-06 LAB — APTT: APTT: 37 s — AB (ref 24–36)

## 2017-10-06 LAB — PROTIME-INR
INR: 1.47
Prothrombin Time: 17.7 seconds — ABNORMAL HIGH (ref 11.4–15.2)

## 2017-10-06 MED ORDER — SODIUM CHLORIDE 0.9 % IV SOLN
1.0000 g | Freq: Once | INTRAVENOUS | Status: AC
  Administered 2017-10-06: 1 g via INTRAVENOUS
  Filled 2017-10-06: qty 1

## 2017-10-06 MED ORDER — SODIUM BICARBONATE 8.4 % IV SOLN
50.0000 meq | Freq: Once | INTRAVENOUS | Status: AC
  Administered 2017-10-07: 50 meq via INTRAVENOUS
  Filled 2017-10-06: qty 50

## 2017-10-06 MED ORDER — DEXTROSE 50 % IV SOLN
1.0000 | Freq: Once | INTRAVENOUS | Status: AC
  Administered 2017-10-06: 50 mL via INTRAVENOUS
  Filled 2017-10-06: qty 50

## 2017-10-06 MED ORDER — INSULIN ASPART 100 UNIT/ML ~~LOC~~ SOLN
10.0000 [IU] | Freq: Once | SUBCUTANEOUS | Status: AC
  Administered 2017-10-06: 10 [IU] via INTRAVENOUS
  Filled 2017-10-06: qty 1

## 2017-10-06 MED ORDER — FUROSEMIDE 10 MG/ML IJ SOLN
40.0000 mg | Freq: Once | INTRAMUSCULAR | Status: AC
  Administered 2017-10-06: 40 mg via INTRAVENOUS
  Filled 2017-10-06: qty 4

## 2017-10-06 MED ORDER — PROPOFOL 1000 MG/100ML IV EMUL
5.0000 ug/kg/min | Freq: Once | INTRAVENOUS | Status: AC
  Administered 2017-10-06: 5 ug/kg/min via INTRAVENOUS
  Filled 2017-10-06: qty 100

## 2017-10-06 MED ORDER — VANCOMYCIN HCL IN DEXTROSE 1-5 GM/200ML-% IV SOLN
1000.0000 mg | Freq: Once | INTRAVENOUS | Status: AC
  Administered 2017-10-07: 1000 mg via INTRAVENOUS
  Filled 2017-10-06: qty 200

## 2017-10-06 MED ORDER — SODIUM CHLORIDE 0.9 % IV SOLN
10.0000 mL/h | Freq: Once | INTRAVENOUS | Status: AC
  Administered 2017-10-06 – 2017-10-07 (×2): 10 mL/h via INTRAVENOUS

## 2017-10-06 NOTE — Consult Note (Signed)
I have been asked to see the patient by Dr. Nita Sickle, for evaluation and management of difficult foley.  History of present illness: The patient presented to the emergency department in cardiac arrest.  He was resuscitated and I was consulted to place a Foley catheter.  In 2016 Dr. Berneice Heinrich had to place a catheter for similar circumstance.  He was noted to have bilateral hydroceles as well as scrotal edema and a buried penis.  The catheter passed easily once the meatus was located.  Review of systems: Due to the patient's altered mental status, intubation, I was unable to obtain any history or review of systems.  Patient Active Problem List   Diagnosis Date Noted  . Iron deficiency anemia due to chronic blood loss   . Symptomatic anemia 06/17/2017  . Pressure injury of skin 06/17/2017  . Atherosclerosis of native arteries of extremity with intermittent claudication (HCC) 12/25/2016  . Chronic venous insufficiency 12/25/2016  . Lymphedema 10/23/2016  . Hyperlipidemia 10/23/2016  . Chronic diastolic heart failure (HCC) 11/10/2014  . Obstructive sleep apnea 11/10/2014  . CAD (coronary artery disease) 10/11/2014  . COPD (chronic obstructive pulmonary disease) (HCC) 10/11/2014  . Type 2 diabetes mellitus (HCC) 10/11/2014  . HTN (hypertension) 10/11/2014  . GERD (gastroesophageal reflux disease) 10/11/2014  . CKD (chronic kidney disease), stage III (HCC) 10/11/2014    No current facility-administered medications on file prior to encounter.    Current Outpatient Medications on File Prior to Encounter  Medication Sig Dispense Refill  . acetaminophen (TYLENOL) 500 MG tablet Take 500 mg by mouth 3 (three) times daily.    [name] amLODipine (NORVASC) 10 MG tablet Take 10 mg by mouth daily.    [name] aspirin EC 81 MG tablet Take 81 mg by mouth daily.    . cloNIDine (CATAPRES) 0.2 MG tablet Take 1 tablet (0.2 mg total) by mouth 3 (three) times daily. (Patient taking differently: Take 0.2 mg by mouth 2  (two) times daily. ) 60 tablet 11  . divalproex (DEPAKOTE ER) 250 MG 24 hr tablet Take 250 mg by mouth every morning.     . divalproex (DEPAKOTE ER) 500 MG 24 hr tablet     . divalproex (DEPAKOTE) 250 MG DR tablet     . divalproex (DEPAKOTE) 500 MG DR tablet Take 500 mg by mouth at bedtime.     . finasteride (PROSCAR) 5 MG tablet Take 1 tablet (5 mg total) by mouth daily.    [name] GLUCAGEN HYPOKIT 1 MG SOLR injection     . glucagon, human recombinant, (GLUCAGEN DIAGNOSTIC) 1 MG injection Inject 1 mg into the muscle once as needed for low blood sugar.    . hydrALAZINE (APRESOLINE) 100 MG tablet Take 100 mg by mouth 3 (three) times daily.    . insulin detemir (LEVEMIR) 100 UNIT/ML injection Inject 34 Units into the skin 2 (two) times daily.     . insulin lispro (HUMALOG) 100 UNIT/ML injection Inject 18 units under the skin with breakfast, 24 units with lunch, 12 units with supper    . metoprolol succinate (TOPROL-XL) 100 MG 24 hr tablet Take 300 mg by mouth daily.     [name] omeprazole (PRILOSEC) 40 MG capsule Take 1 capsule (40 mg total) by mouth 2 (two) times daily for 14 days. 28 capsule 0  . pantoprazole (PROTONIX) 40 MG tablet Take 1 tablet (40 mg total) by mouth daily. 30 tablet 0  . polyethylene glycol (MIRALAX / GLYCOLAX) packet Take 17 g by mouth every  morning.     . senna (SENOKOT) 8.6 MG TABS tablet Take 2 tablets by mouth at bedtime.    . sertraline (ZOLOFT) 25 MG tablet Take 75 mg by mouth daily.     . sertraline (ZOLOFT) 50 MG tablet     . simvastatin (ZOCOR) 10 MG tablet     . simvastatin (ZOCOR) 20 MG tablet Take 30 mg by mouth at bedtime.     . torsemide (DEMADEX) 20 MG tablet Take 2 tablets (40 mg total) by mouth daily. (Patient taking differently: Take 100 mg by mouth daily. )    . Vitamin D, Ergocalciferol, (DRISDOL) 50000 UNITS CAPS capsule Take 50,000 Units by mouth every 30 (thirty) days.       Past Medical History:  Diagnosis Date  . Anxiety   . Cellulitis 10/17/2014   left  lower leg  . Chronic diastolic CHF (congestive heart failure) (HCC)   . CKD (chronic kidney disease), stage III (HCC)   . COPD (chronic obstructive pulmonary disease) (HCC)   . Coronary artery disease   . Dementia    Alzheimer's   . Depression   . Diabetes mellitus without complication (HCC)   . GERD (gastroesophageal reflux disease)   . Hyperlipemia   . Hypertension   . Obesity   . Renal disorder     Past Surgical History:  Procedure Laterality Date  . COLONOSCOPY N/A 06/20/2017   Procedure: COLONOSCOPY;  Surgeon: Toney Reil, MD;  Location: [facility] ENDOSCOPY;  Service: Gastroenterology;  Laterality: N/A;  . ESOPHAGOGASTRODUODENOSCOPY N/A 06/20/2017   Procedure: ESOPHAGOGASTRODUODENOSCOPY (EGD);  Surgeon: Toney Reil, MD;  Location: [facility] ENDOSCOPY;  Service: Gastroenterology;  Laterality: N/A;  . LOWER EXTREMITY ANGIOGRAPHY Left 02/11/2017   Procedure: LOWER EXTREMITY ANGIOGRAPHY;  Surgeon: Renford Dills, MD;  Location: [facility] INVASIVE CV LAB;  Service: Cardiovascular;  Laterality: Left;  . LOWER EXTREMITY ANGIOGRAPHY Left 03/11/2017   Procedure: LOWER EXTREMITY ANGIOGRAPHY;  Surgeon: Renford Dills, MD;  Location: [facility] INVASIVE CV LAB;  Service: Cardiovascular;  Laterality: Left;  . LOWER EXTREMITY INTERVENTION  03/11/2017   Procedure: LOWER EXTREMITY INTERVENTION;  Surgeon: Renford Dills, MD;  Location: [facility] INVASIVE CV LAB;  Service: Cardiovascular;;  . PERIPHERAL VASCULAR ATHERECTOMY Left 02/18/2017   Procedure: PERIPHERAL VASCULAR ATHERECTOMY;  Surgeon: Renford Dills, MD;  Location: [facility] INVASIVE CV LAB;  Service: Cardiovascular;  Laterality: Left;    Social History   Tobacco Use  . Smoking status: Never Smoker  . Smokeless tobacco: Former Neurosurgeon    Types: Chew  Substance Use Topics  . Alcohol use: No  . Drug use: No    Family History  Problem Relation Age of Onset  . Cancer Brother     PE: Vitals:   10/06/17 2200 10/06/17 2215 10/06/17 2245  10/06/17 2300  BP: (!) 109/51 (!) 110/54 122/62 (!) 127/56  Pulse: (!) 45 (!) 44 (!) 46 (!) 44  Resp: 16 13 17 15   SpO2: 98% 99% 98% 96%  Weight:       Patient intubated and sedated Atraumatic normocephalic head No cervical or supraclavicular lymphadenopathy appreciated No increased work of breathing-on pressure support ventilation Regular sinus rhythm/rate  Abdomen is soft, nontender, nondistended, no CVA or suprapubic tenderness The patient has bilateral hydroceles, right significantly greater than left.  He also has a buried penis and small phimosis. Lower extremities are symmetric without appreciable edema No identifiable skin lesions  Recent Labs    10/06/17 2016  WBC PENDING  HGB 6.3*  HCT 20.4*   Recent Labs    10/06/17 2016  NA 132*  K 6.3*  CL 98  CO2 22  GLUCOSE 210*  BUN 62*  CREATININE 1.98*  CALCIUM 8.4*   Recent Labs    10/06/17 2016  INR 1.47   No results for input(s): LABURIN in the last 72 hours. Results for orders placed or performed during the hospital encounter of 06/17/17  Blood Culture (routine x 2)     Status: None   Collection Time: 06/17/17  5:21 PM  Result Value Ref Range Status   Specimen Description BLOOD LEFT ANTECUBITAL  Final   Special Requests   Final    BOTTLES DRAWN AEROBIC AND ANAEROBIC Blood Culture adequate volume   Culture   Final    NO GROWTH 5 DAYS Performed at [facility], [address], [place], [place] [address]    Report Status 06/22/2017 FINAL  Final  Blood Culture (routine x 2)     Status: None   Collection Time: 06/17/17  5:42 PM  Result Value Ref Range Status   Specimen Description BLOOD RIGHT ANTECUBITAL  Final   Special Requests   Final    BOTTLES DRAWN AEROBIC AND ANAEROBIC Blood Culture results may not be optimal due to an inadequate volume of blood received in culture bottles   Culture   Final    NO GROWTH 5 DAYS Performed at [facility], [address], [place], [place]  [address]    Report Status 06/22/2017 FINAL  Final  Aerobic/Anaerobic Culture (surgical/deep wound)     Status: Abnormal   Collection Time: 06/17/17  6:10 PM  Result Value Ref Range Status   Specimen Description   Final    LEG RIGHT LEG Performed at [facility], [address], [place], [place] [address]    Special Requests   Final    NONE Performed at [facility], [address] [place], [place] [address]    Gram Stain   Final    ABUNDANT WBC PRESENT, PREDOMINANTLY PMN ABUNDANT GRAM POSITIVE COCCI MODERATE GRAM NEGATIVE RODS MODERATE GRAM POSITIVE RODS Performed at [facility], [address], [place], [place] [address]    Culture (A)  Final    MULTIPLE ORGANISMS PRESENT, NONE PREDOMINANT MIXED ANAEROBIC FLORA PRESENT.  CALL LAB IF FURTHER IID REQUIRED.    Report Status 06/20/2017 FINAL  Final  MRSA PCR Screening     Status: None   Collection Time: 06/17/17  7:08 PM  Result Value Ref Range Status   MRSA by PCR NEGATIVE NEGATIVE Final    Comment:        The GeneXpert MRSA Assay (FDA approved for NASAL specimens only), is one component of a comprehensive MRSA colonization surveillance program. It is not intended to diagnose MRSA infection nor to guide or monitor treatment for MRSA infections. Performed at [facility], [address], [place], [place] [address]   Urine culture     Status: Abnormal   Collection Time: 06/17/17  9:46 PM  Result Value Ref Range Status   Specimen Description   Final    URINE, RANDOM Performed at [facility], [address] [place], [place] [address]    Special Requests   Final    NONE Performed at [facility], [address] [place], [place] [address]    Culture 80,000 COLONIES/mL ESCHERICHIA COLI (A)  Final   Report Status 06/20/2017 FINAL  Final   Organism ID, Bacteria  ESCHERICHIA COLI (A)  Final      Susceptibility   Escherichia coli - MIC*    AMPICILLIN >=32 RESISTANT  Resistant     CEFAZOLIN >=64 RESISTANT Resistant     CEFTRIAXONE <=1 SENSITIVE Sensitive     CIPROFLOXACIN <=0.25 SENSITIVE Sensitive     GENTAMICIN <=1 SENSITIVE Sensitive     IMIPENEM <=0.25 SENSITIVE Sensitive     NITROFURANTOIN <=16 SENSITIVE Sensitive     TRIMETH/SULFA <=20 SENSITIVE Sensitive     AMPICILLIN/SULBACTAM 16 INTERMEDIATE Intermediate     PIP/TAZO <=4 SENSITIVE Sensitive     Extended ESBL NEGATIVE Sensitive     * 80,000 COLONIES/mL ESCHERICHIA COLI   Procedure: Using sterile technique and the assistance of the bedside nurse we are able to pull the foreskin back enough to visualize the urethral meatus.  Once the meatus was found we used Betadine to sterilize the area and then advanced a 16 [place] coud tip catheter all the way to the hub, cloudy urine was returned, 10 cc of sterile water was inflated the balloon.  But the catheter was then placed to a bag.  Imp: The patient has bilateral hydroceles, buried penis, and phimosis making his Foley catheterization difficult for the staff.  We were able to pass a 16 [place] coud tip catheter atraumatically.  Recommendations: The patient should have the catheter removed when deemed appropriate by the hospital service.   Thank you for involving me in this patient's care, please page with any further questions or concerns. Berniece Salines W

## 2017-10-06 NOTE — ED Provider Notes (Signed)
[facility] Emergency Department Provider Note  ____________________________________________  Time seen: Approximately 9:46 PM  I have reviewed the triage vital signs and the nursing notes.   HISTORY  Chief Complaint Cardiac Arrest  Level 5 caveat:  Portions of the history and physical were unable to be obtained due to unresponsive   HPI Billy Liu is a 84 y.o. male with history as listed below who presents for evaluation of cardiac arrest.  Patient was found at his skilled nursing facility facedown on the floor of the bathroom, pulseless, and not breathing.  Patient received several rounds of CPR per staff prior to EMS arrival.  When EMS arrived the patient had a pulse, he was hypoxic and having agonal respirations.  Patient arrives being bagged with a GCS of 3.  Past Medical History:  Diagnosis Date  . Anxiety   . Cellulitis 10/17/2014   left lower leg  . Chronic diastolic CHF (congestive heart failure) (HCC)   . CKD (chronic kidney disease), stage III (HCC)   . COPD (chronic obstructive pulmonary disease) (HCC)   . Coronary artery disease   . Dementia    Alzheimer's   . Depression   . Diabetes mellitus without complication (HCC)   . GERD (gastroesophageal reflux disease)   . Hyperlipemia   . Hypertension   . Obesity   . Renal disorder     Patient Active Problem List   Diagnosis Date Noted  . Iron deficiency anemia due to chronic blood loss   . Symptomatic anemia 06/17/2017  . Pressure injury of skin 06/17/2017  . Atherosclerosis of native arteries of extremity with intermittent claudication (HCC) 12/25/2016  . Chronic venous insufficiency 12/25/2016  . Lymphedema 10/23/2016  . Hyperlipidemia 10/23/2016  . Chronic diastolic heart failure (HCC) 11/10/2014  . Obstructive sleep apnea 11/10/2014  . CAD (coronary artery disease) 10/11/2014  . COPD (chronic obstructive pulmonary disease) (HCC) 10/11/2014  . Type 2 diabetes mellitus  (HCC) 10/11/2014  . HTN (hypertension) 10/11/2014  . GERD (gastroesophageal reflux disease) 10/11/2014  . CKD (chronic kidney disease), stage III (HCC) 10/11/2014    Past Surgical History:  Procedure Laterality Date  . COLONOSCOPY N/A 06/20/2017   Procedure: COLONOSCOPY;  Surgeon: Toney Reil, MD;  Location: [facility] ENDOSCOPY;  Service: Gastroenterology;  Laterality: N/A;  . ESOPHAGOGASTRODUODENOSCOPY N/A 06/20/2017   Procedure: ESOPHAGOGASTRODUODENOSCOPY (EGD);  Surgeon: Toney Reil, MD;  Location: [facility] ENDOSCOPY;  Service: Gastroenterology;  Laterality: N/A;  . LOWER EXTREMITY ANGIOGRAPHY Left 02/11/2017   Procedure: LOWER EXTREMITY ANGIOGRAPHY;  Surgeon: Renford Dills, MD;  Location: [facility] INVASIVE CV LAB;  Service: Cardiovascular;  Laterality: Left;  . LOWER EXTREMITY ANGIOGRAPHY Left 03/11/2017   Procedure: LOWER EXTREMITY ANGIOGRAPHY;  Surgeon: Renford Dills, MD;  Location: [facility] INVASIVE CV LAB;  Service: Cardiovascular;  Laterality: Left;  . LOWER EXTREMITY INTERVENTION  03/11/2017   Procedure: LOWER EXTREMITY INTERVENTION;  Surgeon: Renford Dills, MD;  Location: [facility] INVASIVE CV LAB;  Service: Cardiovascular;;  . PERIPHERAL VASCULAR ATHERECTOMY Left 02/18/2017   Procedure: PERIPHERAL VASCULAR ATHERECTOMY;  Surgeon: Renford Dills, MD;  Location: [facility] INVASIVE CV LAB;  Service: Cardiovascular;  Laterality: Left;    Prior to Admission medications   Medication Sig Start Date End Date Taking? Authorizing Provider  acetaminophen (TYLENOL) 500 MG tablet Take 500 mg by mouth 3 (three) times daily.    [provider]  amLODipine (NORVASC) 10 MG tablet Take 10 mg by mouth daily.    [provider]  aspirin  EC 81 MG tablet Take 81 mg by mouth daily.    [provider]  cloNIDine (CATAPRES) 0.2 MG tablet Take 1 tablet (0.2 mg total) by mouth 3 (three) times daily. Patient taking differently: Take 0.2 mg by mouth 2 (two) times daily.  10/31/14    Houston Siren, MD  divalproex (DEPAKOTE ER) 250 MG 24 hr tablet Take 250 mg by mouth every morning.     [provider]  divalproex (DEPAKOTE ER) 500 MG 24 hr tablet  06/19/17   [provider]  divalproex (DEPAKOTE) 250 MG DR tablet  08/07/17   [provider]  divalproex (DEPAKOTE) 500 MG DR tablet Take 500 mg by mouth at bedtime.     [provider]  finasteride (PROSCAR) 5 MG tablet Take 1 tablet (5 mg total) by mouth daily. 10/31/14   Houston Siren, MD  GLUCAGEN HYPOKIT 1 MG SOLR injection  07/07/17   [provider]  glucagon, human recombinant, (GLUCAGEN DIAGNOSTIC) 1 MG injection Inject 1 mg into the muscle once as needed for low blood sugar.    [provider]  hydrALAZINE (APRESOLINE) 100 MG tablet Take 100 mg by mouth 3 (three) times daily.    [provider]  insulin detemir (LEVEMIR) 100 UNIT/ML injection Inject 34 Units into the skin 2 (two) times daily.     [provider]  insulin lispro (HUMALOG) 100 UNIT/ML injection Inject 18 units under the skin with breakfast, 24 units with lunch, 12 units with supper    [provider]  metoprolol succinate (TOPROL-XL) 100 MG 24 hr tablet Take 300 mg by mouth daily.     [provider]  omeprazole (PRILOSEC) 40 MG capsule Take 1 capsule (40 mg total) by mouth 2 (two) times daily for 14 days. 06/25/17 07/09/17  Toney Reil, MD  pantoprazole (PROTONIX) 40 MG tablet Take 1 tablet (40 mg total) by mouth daily. 06/23/17   Milagros Loll, MD  polyethylene glycol (MIRALAX / GLYCOLAX) packet Take 17 g by mouth every morning.  03/14/17   [provider]  senna (SENOKOT) 8.6 MG TABS tablet Take 2 tablets by mouth at bedtime.    [provider]  sertraline (ZOLOFT) 25 MG tablet Take 75 mg by mouth daily.     [provider]  sertraline (ZOLOFT) 50 MG tablet  08/07/17   [provider]  simvastatin (ZOCOR) 10 MG tablet  06/26/17    [provider]  simvastatin (ZOCOR) 20 MG tablet Take 30 mg by mouth at bedtime.     [provider]  torsemide (DEMADEX) 20 MG tablet Take 2 tablets (40 mg total) by mouth daily. Patient taking differently: Take 100 mg by mouth daily.  10/31/14   Houston Siren, MD  Vitamin D, Ergocalciferol, (DRISDOL) 50000 UNITS CAPS capsule Take 50,000 Units by mouth every 30 (thirty) days.     [provider]    Allergies Penicillins  Family History  Problem Relation Age of Onset  . Cancer Brother     Social History Social History   Tobacco Use  . Smoking status: Never Smoker  . Smokeless tobacco: Former Neurosurgeon    Types: Chew  Substance Use Topics  . Alcohol use: No  . Drug use: No    Review of Systems Constitutional: + cardiac arrest  ____________________________________________   PHYSICAL EXAM:  VITAL SIGNS: ED Triage Vitals  Enc Vitals Group     BP 10/06/17 2030 (!) 123/51  Pulse Rate 10/06/17 2030 (!) 52     Resp 10/06/17 2030 17     Temp --      Temp src --      SpO2 10/06/17 2030 100 %     Weight 10/06/17 2039 285 lb 7.9 oz (129.5 kg)     Height --      Head Circumference --      Peak Flow --      Pain Score 10/06/17 2039 0     Pain Loc --      Pain Edu? --      Excl. in GC? --     Constitutional: GCS 3 HEENT:      Head: Normocephalic and atraumatic.         Eyes: Pupils are 3 mm and fixed, no corneal reflex      mouth/Throat: Patent airway, oral trumpet in place      Neck: Supple with no signs of meningismus. Atrumatic Cardiovascular: Bradycardic and regular Respiratory: Bagged Gastrointestinal: Soft, and non distended Musculoskeletal: 3+ pitting edema bilaterally Neurologic: GCS 3 Skin: Skin is warm, dry and intact. No rash noted.  ____________________________________________   LABS (all labs ordered are listed, but only abnormal results are displayed)  Labs Reviewed  CBC WITH DIFFERENTIAL/PLATELET - Abnormal; Notable  for the following components:      Result Value   RBC 2.39 (*)    Hemoglobin 6.3 (*)    HCT 20.4 (*)    MCHC 30.7 (*)    RDW 18.6 (*)    All other components within normal limits  COMPREHENSIVE METABOLIC PANEL - Abnormal; Notable for the following components:   Sodium 132 (*)    Potassium 6.3 (*)    Glucose, Bld 210 (*)    BUN 62 (*)    Creatinine, Ser 1.98 (*)    Calcium 8.4 (*)    AST 51 (*)    GFR calc non Af Amer 30 (*)    GFR calc Af Amer 34 (*)    All other components within normal limits  PROTIME-INR - Abnormal; Notable for the following components:   Prothrombin Time 17.7 (*)    All other components within normal limits  APTT - Abnormal; Notable for the following components:   aPTT 37 (*)    All other components within normal limits  BLOOD GAS, ARTERIAL - Abnormal; Notable for the following components:   pH, Arterial 7.25 (*)    Acid-base deficit 6.4 (*)    All other components within normal limits  CULTURE, BLOOD (ROUTINE X 2)  CULTURE, BLOOD (ROUTINE X 2)  TROPONIN I  TYPE AND SCREEN  PREPARE RBC (CROSSMATCH)   ____________________________________________  EKG  ED ECG REPORT I, Nita Sickle, the attending physician, personally viewed and interpreted this ECG.   Sinus bradycardia, rate of 53, first-degree AV block, normal QRS and QTC, normal axis, no ST elevations, mild ST depressions in inferior and lateral leads. ____________________________________________  RADIOLOGY  I have personally reviewed the images performed during this visit and I agree with the Radiologist's read.   Interpretation by Radiologist:  Dg Abd 1 View  Result Date: 10/06/2017 CLINICAL DATA:  OG tube EXAM: ABDOMEN - 1 VIEW COMPARISON:  None. FINDINGS: By history patient has an OG tube. Just to the RIGHT of the thoracic spine, there is a radiopaque line, possibly an orogastric tube. However this is slightly to the RIGHT of the expected course of the esophagus and does not extend  into the LEFT  UPPER QUADRANT. It is possible that orogastric tube has been withdrawn or removed and not included on the film. There is dense opacification of the LEFT lung base. Visualized bowel gas pattern is nonobstructive. IMPRESSION: Possible orogastric tube to the distal esophagus, but this appears slightly to the RIGHT of the expected course of the esophagus. Recommend confirmation of orogastric tube placement and consider advancing tube further into the stomach if this is felt to be the orogastric tube. Significant LEFT LOWER lobe opacity. Electronically Signed   By: Norva Pavlov M.D.   On: 10/06/2017 21:00   Ct Head Wo Contrast  Result Date: 10/06/2017 CLINICAL DATA:  Altered mental status (AMS), unclear cause; C-spine trauma, high clinical risk (NEXUS/CCR). Collapsed in the bathroom. EXAM: CT HEAD WITHOUT CONTRAST CT CERVICAL SPINE WITHOUT CONTRAST TECHNIQUE: Multidetector CT imaging of the head and cervical spine was performed following the standard protocol without intravenous contrast. Multiplanar CT image reconstructions of the cervical spine were also generated. COMPARISON:  Head CT 06/17/2017 FINDINGS: CT HEAD FINDINGS Brain: Unchanged degree of atrophy and chronic small vessel ischemia. Encephalomalacia in the left parietal lobe again seen. No intracranial hemorrhage, mass effect, or midline shift. No hydrocephalus. The basilar cisterns are patent. No evidence of territorial infarct or acute ischemia. No extra-axial or intracranial fluid collection. Vascular: Atherosclerosis of skullbase vasculature without hyperdense vessel or abnormal calcification. Skull: Prior left frontoparietal craniotomy.  No skull fracture. Sinuses/Orbits: Paranasal sinuses and mastoid air cells are clear. The visualized orbits are unremarkable. Bilateral cataract resection. Other: None. CT CERVICAL SPINE FINDINGS Alignment: Straightening of normal cervical lordosis. Skull base and vertebrae: No acute fracture. Bulky  anterior osteophytes from C3-C4 through C6-C7, well corticated lucency through the osteophytes at C4 and C5 may represent remote prior trauma. The dens and skull base are intact. Soft tissues and spinal canal: Debris in the hypopharynx limits assessment for prevertebral edema, no gross prevertebral soft tissue thickening. No evidence of canal hematoma. Disc levels: Near complete disc space loss at C2-C3 with bony ankylosis of the posterior elements on the left. Disc space narrowing at C5-C6 and C6-C7. 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 Bulky anterior osteophytes throughout, some of which are fragmented. Multilevel facet arthropathy. Upper chest: Moderate right pleural effusion, partially included. Other: Soft tissue prominence involving the right aspect of the hypopharynx is incompletely included in the field of view, causing mild leftward endotracheal and enteric tube deviation. There are dense vascular calcifications. IMPRESSION: 1.  No acute intracranial abnormality.  No skull fracture. 2. Unchanged atrophy, chronic small vessel ischemia, and left parietal encephalomalacia. 3. Advanced multilevel degenerative change in the cervical spine without evidence of acute fracture. 4. Soft tissue prominence involving the right hypopharynx is only partially included in the field of view, this may represent retained secretions or asymmetric positioning of the patient's tongue, however underlying mass lesion is not excluded. Consider contrast enhanced CT of the neck as clinically indicated. 5. Moderate right pleural effusion partially included in the field of view. Electronically Signed   By: Narda Rutherford M.D.   On: 10/06/2017 21:38   Ct Cervical Spine Wo Contrast  Result Date: 10/06/2017 CLINICAL DATA:  Altered mental status (AMS), unclear cause; C-spine trauma, high clinical risk (NEXUS/CCR). Collapsed in the bathroom. EXAM: CT HEAD WITHOUT CONTRAST CT CERVICAL SPINE WITHOUT CONTRAST  TECHNIQUE: Multidetector CT imaging of the head and cervical spine was performed following the standard protocol without intravenous contrast. Multiplanar CT image reconstructions of the cervical spine were also generated. COMPARISON:  Head CT 06/17/2017 FINDINGS: CT HEAD FINDINGS Brain: Unchanged degree of atrophy and chronic small vessel ischemia. Encephalomalacia in the left parietal lobe again seen. No intracranial hemorrhage, mass effect, or midline shift. No hydrocephalus. The basilar cisterns are patent. No evidence of territorial infarct or acute ischemia. No extra-axial or intracranial fluid collection. Vascular: Atherosclerosis of skullbase vasculature without hyperdense vessel or abnormal calcification. Skull: Prior left frontoparietal craniotomy.  No skull fracture. Sinuses/Orbits: Paranasal sinuses and mastoid air cells are clear. The visualized orbits are unremarkable. Bilateral cataract resection. Other: None. CT CERVICAL SPINE FINDINGS Alignment: Straightening of normal cervical lordosis. Skull base and vertebrae: No acute fracture. Bulky anterior osteophytes from C3-C4 through C6-C7, well corticated lucency through the osteophytes at C4 and C5 may represent remote prior trauma. The dens and skull base are intact. Soft tissues and spinal canal: Debris in the hypopharynx limits assessment for prevertebral edema, no gross prevertebral soft tissue thickening. No evidence of canal hematoma. Disc levels: Near complete disc space loss at C2-C3 with bony ankylosis of the posterior elements on the left. Disc space narrowing at C5-C6 and C6-C7. 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 Bulky anterior osteophytes throughout, some of which are fragmented. Multilevel facet arthropathy. Upper chest: Moderate right pleural effusion, partially included. Other: Soft tissue prominence involving the right aspect of the hypopharynx is incompletely included in the field of view, causing mild leftward  endotracheal and enteric tube deviation. There are dense vascular calcifications. IMPRESSION: 1.  No acute intracranial abnormality.  No skull fracture. 2. Unchanged atrophy, chronic small vessel ischemia, and left parietal encephalomalacia. 3. Advanced multilevel degenerative change in the cervical spine without evidence of acute fracture. 4. Soft tissue prominence involving the right hypopharynx is only partially included in the field of view, this may represent retained secretions or asymmetric positioning of the patient's tongue, however underlying mass lesion is not excluded. Consider contrast enhanced CT of the neck as clinically indicated. 5. Moderate right pleural effusion partially included in the field of view. Electronically Signed   By: Narda Rutherford M.D.   On: 10/06/2017 21:38   Dg Chest Portable 1 View  Result Date: 10/06/2017 CLINICAL DATA:  Ett tube EXAM: PORTABLE CHEST 1 VIEW COMPARISON:  06/20/2017 FINDINGS: Interval placement of endotracheal tube, tip approximately 3.0 centimeters above the carina. An orogastric tube is in place, tip overlying the level of the LOWER esophagus but difficult to confirm. The heart is enlarged. There is dense opacity in the LEFT lung base. Perihilar opacities are consistent with pulmonary edema. IMPRESSION: Interval placement of endotracheal tube and orogastric tube. LEFT LOWER lobe opacity and pulmonary edema. Electronically Signed   By: Norva Pavlov M.D.   On: 10/06/2017 21:02     ____________________________________________   PROCEDURES  Procedure(s) performed: yes Procedure Name: Intubation Date/Time: 10/06/2017 9:51 PM Performed by: Nita Sickle, MD Pre-anesthesia Checklist: Patient identified, Emergency Drugs available, Suction available and Patient being monitored Preoxygenation: Pre-oxygenation with 100% oxygen Induction Type: IV induction and Rapid sequence Ventilation: Mask ventilation without difficulty Laryngoscope Size:  Glidescope and 4 Tube size: 7.5 mm Number of attempts: 1 Placement Confirmation: ETT inserted through vocal cords under direct vision,  CO2 detector and Breath sounds checked- equal and bilateral Secured at: 26 cm Tube secured with: ETT holder Dental Injury: Teeth and Oropharynx as per pre-operative assessment       Critical Care performed: yes  CRITICAL CARE Performed by: Nita Sickle  ?  Total critical care time: 45 min  Critical care time  was exclusive of separately billable procedures and treating other patients.  Critical care was necessary to treat or prevent imminent or life-threatening deterioration.  Critical care was time spent personally by me on the following activities: development of treatment plan with patient and/or surrogate as well as nursing, discussions with consultants, evaluation of patient's response to treatment, examination of patient, obtaining history from patient or surrogate, ordering and performing treatments and interventions, ordering and review of laboratory studies, ordering and review of radiographic studies, pulse oximetry and re-evaluation of patient's condition.  ____________________________________________   INITIAL IMPRESSION / ASSESSMENT AND PLAN / ED COURSE   84 y.o. male with history as listed below who presents for evaluation of cardiac arrest. Patient with ROSC in the field, unknown initial rhythm since CPR was started by nursing facility staff and upon arrival of EMS patient already had pulses back.  Patient arrives with a GCS of 3, being bagged, bradycardic with normal blood pressure and satting 100%.  Patient's neuro exam is very poor with no pupillary response, and fixed pupils bilaterally.  No obvious signs of trauma.  Patient was intubated with succinylcholine and etomidate.  Patient was then taken to CT scan to rule out acute intracranial hemorrhage which is negative.  CT cervical spine was also done since patient was found on  the ground to rule out any fractures.  Chest x-ray showed good position of the tube with left lower lobe pneumonia.  Patient was started on cefepime and Vanco for HCAP. Patient with a few spontaneous breaths after being intubated and therefore started on propofol. Due to poor neuro exam, cooling protocol was initiated. Labs pending. Will be admitted to ICU  Clinical Course as of Oct 07 2310  Mon Oct 06, 2017  2310 2 attempts to place Foley catheter failed.  Urology was consulted and Dr. Marlou Porch was able to place a Foley catheter for diuresis.  Patient given IV Lasix, also given D50, insulin, bicarb for hyperkalemia.  Family has been updated of patient's status.  Patient is waiting for ICU admission.   [CV]    Clinical Course User Index [CV] Don Perking [place], MD     As part of my medical decision making, I reviewed the following data within the electronic MEDICAL RECORD NUMBER Nursing notes reviewed and incorporated, Labs reviewed , EKG interpreted , Old EKG reviewed, Old chart reviewed, Radiograph reviewed , Discussed with admitting physician , A consult was requested and obtained from this/these consultant(s) Urology, Notes from prior ED visits and [place] Controlled Substance Database    Pertinent labs & imaging results that were available during my care of the patient were reviewed by me and considered in my medical decision making (see chart for details).    ____________________________________________   FINAL CLINICAL IMPRESSION(S) / ED DIAGNOSES  Final diagnoses:  Encounter for orogastric (OG) tube placement  Cardiac arrest (HCC)  HCAP (healthcare-associated pneumonia)  Hyperkalemia  Acute on chronic congestive heart failure, unspecified heart failure type (HCC)      NEW MEDICATIONS STARTED DURING THIS VISIT:  ED Discharge Orders    None       Note:  This document was prepared using Dragon voice recognition software and may include unintentional dictation errors.      Don Perking, [place], MD 10/06/17 [phone]

## 2017-10-06 NOTE — ED Notes (Signed)
Date and time results received: 10/06/17 2200  Test: Potassium Critical Value: 6.3 Name of Provider Notified: Don Perking MD

## 2017-10-06 NOTE — ED Notes (Signed)
Pt has grossly edematous scrotum.  Unable to insert foley.  EDP notified of scrotal swelling, EDP to bedside

## 2017-10-06 NOTE — ED Notes (Signed)
Pt has wound to right lower leg.  Dressing is in place, it appears that there has been some drainage from this wound in the past.  Pt has generalized edema and a grossly edematous scrotum

## 2017-10-06 NOTE — ED Notes (Signed)
Pt was taken to CT with RN and CT tech and RT.  Sedation began per EDP orders.

## 2017-10-06 NOTE — ED Triage Notes (Signed)
Pt is brought in by ems from SNF where he was found after he collapsed in the bathroom.  Pt was found to be pulseless and staff performed CPR.  No shocks, no meds.  Pt arrives, being bagged.  Not responsive.  Intubation by EDP on arrival after pt received etomidate and succ.  Pt is on monitor and was placed on zoll

## 2017-10-06 NOTE — Progress Notes (Signed)
Pt. Transported on vent to CT and back without incident. 

## 2017-10-06 NOTE — Consult Note (Signed)
Name: Billy Liu MRN: [id number] DOB: 1932/09/16    ADMISSION DATE:  10/06/2017 CONSULTATION DATE: 10/06/2017  REFERRING MD : Dr. Duane Boston  CHIEF COMPLAINT: Cardiac Arrest   BRIEF PATIENT DESCRIPTION:  84 yo male admitted with acute on chronic renal failure post cardiac arrest mechanically intubated hypothermic protocol initiated '@36'  degrees C  SIGNIFICANT EVENTS/STUDIES:  09/30-Pt admitted to ICU post cardiac arrest mechanically intubated hypothermic protocol initiated by ER provider 09/30-CT Head and Cervical Spine revealed no acute intracranial abnormality.  No skull fracture. Unchanged atrophy, chronic small vessel ischemia, and left parietal encephalomalacia. Advanced multilevel degenerative change in the cervical spine without evidence of acute fracture. Soft tissue prominence involving the right hypopharynx is only partially included in the field of view, this may represent retained secretions or asymmetric positioning of the patient's tongue, however underlying mass lesion is not excluded. Consider contrast enhanced CT of the neck as clinically indicated. Moderate right pleural effusion partially included in the field of view.  HISTORY OF PRESENT ILLNESS:   This is an 84 yo male with a PMH of Obesity, HTN, Hyperlipidemia, GERD, Diabetes Mellitus, Depression, Alzheimer's, CAD, COPD, CKD stage III, Chronic Diastolic CHF, and Anxiety.  He presented to [facility] ER on 09/30 from [facility] after being found pulseless on the bathroom floor by staff.  Per ER notes several rounds of CPR initiated by staff at [facility]. Initial cardiac rhythm unknown, pt did not receive medications or defibrillation during CPR.  Upon EMS arrival pt had a pulse cardiac rhythm bradycardia, he required bag mask ventilation due to agonal respirations with hypoxia.  Upon arrival to the ER pt mechanically intubated by ER physician and cooling protocol initiated.  CT Head negative, initial EKG sinus bradycardia hr 53 bpm,  and, CXR revealed LLL pneumonia and pulmonary edema.  Lab results revealed K+ 6.3, glucose 210, BUN 62, creatinine 1.98, AST 51, hgb 6.3, and troponin <0.03. Pt received iv abx and 2 units pRBC's. Urology consulted by ER physician for difficult foley catheter placement.  He was subsequently admitted to ICU by hospitalist team for further workup and treatment.    PAST MEDICAL HISTORY :   has a past medical history of Anxiety, Cellulitis (10/17/2014), Chronic diastolic CHF (congestive heart failure) ([place]), CKD (chronic kidney disease), stage III ([place]), COPD (chronic obstructive pulmonary disease) ([place]), Coronary artery disease, Dementia, Depression, Diabetes mellitus without complication ([place]), GERD (gastroesophageal reflux disease), Hyperlipemia, Hypertension, Obesity, and Renal disorder.  has a past surgical history that includes Lower Extremity Angiography (Left, 02/11/2017); PERIPHERAL VASCULAR ATHERECTOMY (Left, 02/18/2017); Lower Extremity Angiography (Left, 03/11/2017); LOWER EXTREMITY INTERVENTION (03/11/2017); Esophagogastroduodenoscopy (N/A, 06/20/2017); and Colonoscopy (N/A, 06/20/2017). Prior to Admission medications   Medication Sig Start Date End Date Taking? Authorizing Provider  acetaminophen (TYLENOL) 500 MG tablet Take 500 mg by mouth 3 (three) times daily.    [provider]  amLODipine (NORVASC) 10 MG tablet Take 10 mg by mouth daily.    [provider]  aspirin EC 81 MG tablet Take 81 mg by mouth daily.    [provider]  cloNIDine (CATAPRES) 0.2 MG tablet Take 1 tablet (0.2 mg total) by mouth 3 (three) times daily. Patient taking differently: Take 0.2 mg by mouth 2 (two) times daily.  10/31/14   Henreitta Leber, MD  divalproex (DEPAKOTE ER) 250 MG 24 hr tablet Take 250 mg by mouth every morning.     [provider]  divalproex (DEPAKOTE ER) 500 MG 24 hr tablet  06/19/17   [provider]  divalproex (DEPAKOTE) 250 MG DR tablet  08/07/17   [provider]  divalproex (DEPAKOTE) 500 MG DR tablet Take 500 mg by mouth at bedtime.     [provider]  finasteride (PROSCAR) 5 MG tablet Take 1 tablet (5 mg total) by mouth daily. 10/31/14   Henreitta Leber, MD  GLUCAGEN HYPOKIT 1 MG SOLR injection  07/07/17   [provider]  glucagon, human recombinant, (GLUCAGEN DIAGNOSTIC) 1 MG injection Inject 1 mg into the muscle once as needed for low blood sugar.    [provider]  hydrALAZINE (APRESOLINE) 100 MG tablet Take 100 mg by mouth 3 (three) times daily.    [provider]  insulin detemir (LEVEMIR) 100 UNIT/ML injection Inject 34 Units into the skin 2 (two) times daily.     [provider]  insulin lispro (HUMALOG) 100 UNIT/ML injection Inject 18 units under the skin with breakfast, 24 units with lunch, 12 units with supper    [provider]  metoprolol succinate (TOPROL-XL) 100 MG 24 hr tablet Take 300 mg by mouth daily.     [provider]  omeprazole (PRILOSEC) 40 MG capsule Take 1 capsule (40 mg total) by mouth 2 (two) times daily for 14 days. 06/25/17 07/09/17  Lin Landsman, MD  pantoprazole (PROTONIX) 40 MG tablet Take 1 tablet (40 mg total) by mouth daily. 06/23/17   Hillary Bow, MD  polyethylene glycol (MIRALAX / GLYCOLAX) packet Take 17 g by mouth every morning.  03/14/17   [provider]  senna (SENOKOT) 8.6 MG TABS tablet Take 2 tablets by mouth at bedtime.    [provider]  sertraline (ZOLOFT) 25 MG tablet Take 75 mg by mouth daily.     [provider]  sertraline (ZOLOFT) 50 MG tablet  08/07/17   [provider]  simvastatin (ZOCOR) 10 MG tablet  06/26/17   [provider]  simvastatin (ZOCOR) 20 MG tablet Take 30 mg by mouth at bedtime.     [provider]  torsemide (DEMADEX) 20 MG tablet Take 2 tablets (40 mg total) by mouth daily. Patient taking differently: Take 100 mg by mouth daily.  10/31/14    Henreitta Leber, MD  Vitamin D, Ergocalciferol, (DRISDOL) 50000 UNITS CAPS capsule Take 50,000 Units by mouth every 30 (thirty) days.     [provider]   Allergies  Allergen Reactions  . Penicillins Other (See Comments)    Per MAR     FAMILY HISTORY:  family history includes Cancer in his brother. SOCIAL HISTORY:  reports that he has never smoked. He has quit using smokeless tobacco.  His smokeless tobacco use included chew. He reports that he does not drink alcohol or use drugs.  REVIEW OF SYSTEMS:   Unable to assess pt intubated   SUBJECTIVE:  Unable to assess pt intubated   CULTURES: Blood x2 10/1>> Urine 10/1>> Wound culture left foot 10/1>> Would culture right leg 10/1>>  VITAL SIGNS: Pulse Rate:  [44-52] 44 (09/30 2300) Resp:  [13-17] 15 (09/30 2300) BP: (109-129)/(51-62) 127/56 (09/30 2300) SpO2:  [96 %-100 %] 96 % (09/30 2300) FiO2 (%):  [100 %] 100 % (09/30 2037) Weight:  [129.5 kg] 129.5 kg (09/30 2039)  PHYSICAL EXAMINATION: General: acutely ill appearing male, NAD mechanically intubated  Neuro: sedated, not following commands, PERRL  HEENT: supple, no JVD Cardiovascular: sinus brady, no R/G Lungs: rhonchi throughout, even, non labored  Abdomen: hypoactive BS x4, taut,  non distended  GU: bilateral hydroceles with scrotal edema  Musculoskeletal: 2+ bilateral lower extremity edema  Skin: left foot diabetic ulcer with drainage and right leg diabetic ulcer with drainage   Recent Labs  Lab 10/06/17 2016  NA 132*  K 6.3*  CL 98  CO2 22  BUN 62*  CREATININE 1.98*  GLUCOSE 210*   Recent Labs  Lab 10/06/17 2016  HGB 6.3*  HCT 20.4*  WBC 9.4  PLT 333   Dg Abd 1 View  Result Date: 10/06/2017 CLINICAL DATA:  OG tube EXAM: ABDOMEN - 1 VIEW COMPARISON:  None. FINDINGS: By history patient has an OG tube. Just to the RIGHT of the thoracic spine, there is a radiopaque line, possibly an orogastric tube. However this is slightly to the RIGHT of  the expected course of the esophagus and does not extend into the LEFT UPPER QUADRANT. It is possible that orogastric tube has been withdrawn or removed and not included on the film. There is dense opacification of the LEFT lung base. Visualized bowel gas pattern is nonobstructive. IMPRESSION: Possible orogastric tube to the distal esophagus, but this appears slightly to the RIGHT of the expected course of the esophagus. Recommend confirmation of orogastric tube placement and consider advancing tube further into the stomach if this is felt to be the orogastric tube. Significant LEFT LOWER lobe opacity. Electronically Signed   By: Nolon Nations M.D.   On: 10/06/2017 21:00   Ct Head Wo Contrast  Result Date: 10/06/2017 CLINICAL DATA:  Altered mental status (AMS), unclear cause; C-spine trauma, high clinical risk (NEXUS/CCR). Collapsed in the bathroom. EXAM: CT HEAD WITHOUT CONTRAST CT CERVICAL SPINE WITHOUT CONTRAST TECHNIQUE: Multidetector CT imaging of the head and cervical spine was performed following the standard protocol without intravenous contrast. Multiplanar CT image reconstructions of the cervical spine were also generated. COMPARISON:  Head CT 06/17/2017 FINDINGS: CT HEAD FINDINGS Brain: Unchanged degree of atrophy and chronic small vessel ischemia. Encephalomalacia in the left parietal lobe again seen. No intracranial hemorrhage, mass effect, or midline shift. No hydrocephalus. The basilar cisterns are patent. No evidence of territorial infarct or acute ischemia. No extra-axial or intracranial fluid collection. Vascular: Atherosclerosis of skullbase vasculature without hyperdense vessel or abnormal calcification. Skull: Prior left frontoparietal craniotomy.  No skull fracture. Sinuses/Orbits: Paranasal sinuses and mastoid air cells are clear. The visualized orbits are unremarkable. Bilateral cataract resection. Other: None. CT CERVICAL SPINE FINDINGS Alignment: Straightening of normal cervical  lordosis. Skull base and vertebrae: No acute fracture. Bulky anterior osteophytes from C3-C4 through C6-C7, well corticated lucency through the osteophytes at C4 and C5 may represent remote prior trauma. The dens and skull base are intact. Soft tissues and spinal canal: Debris in the hypopharynx limits assessment for prevertebral edema, no gross prevertebral soft tissue thickening. No evidence of canal hematoma. Disc levels: Near complete disc space loss at C2-C3 with bony ankylosis of the posterior elements on the left. Disc space narrowing at C5-C6 and C6-C7. 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 Bulky anterior osteophytes throughout, some of which are fragmented. Multilevel facet arthropathy. Upper chest: Moderate right pleural effusion, partially included. Other: Soft tissue prominence involving the right aspect of the hypopharynx is incompletely included in the field of view, causing mild leftward endotracheal and enteric tube deviation. There are dense vascular calcifications. IMPRESSION: 1.  No acute intracranial abnormality.  No skull fracture. 2. Unchanged atrophy, chronic small vessel ischemia, and left parietal encephalomalacia. 3. Advanced multilevel degenerative change in the cervical  spine without evidence of acute fracture. 4. Soft tissue prominence involving the right hypopharynx is only partially included in the field of view, this may represent retained secretions or asymmetric positioning of the patient's tongue, however underlying mass lesion is not excluded. Consider contrast enhanced CT of the neck as clinically indicated. 5. Moderate right pleural effusion partially included in the field of view. Electronically Signed   By: Keith Rake M.D.   On: 10/06/2017 21:38   Ct Cervical Spine Wo Contrast  Result Date: 10/06/2017 CLINICAL DATA:  Altered mental status (AMS), unclear cause; C-spine trauma, high clinical risk (NEXUS/CCR). Collapsed in the bathroom. EXAM: CT  HEAD WITHOUT CONTRAST CT CERVICAL SPINE WITHOUT CONTRAST TECHNIQUE: Multidetector CT imaging of the head and cervical spine was performed following the standard protocol without intravenous contrast. Multiplanar CT image reconstructions of the cervical spine were also generated. COMPARISON:  Head CT 06/17/2017 FINDINGS: CT HEAD FINDINGS Brain: Unchanged degree of atrophy and chronic small vessel ischemia. Encephalomalacia in the left parietal lobe again seen. No intracranial hemorrhage, mass effect, or midline shift. No hydrocephalus. The basilar cisterns are patent. No evidence of territorial infarct or acute ischemia. No extra-axial or intracranial fluid collection. Vascular: Atherosclerosis of skullbase vasculature without hyperdense vessel or abnormal calcification. Skull: Prior left frontoparietal craniotomy.  No skull fracture. Sinuses/Orbits: Paranasal sinuses and mastoid air cells are clear. The visualized orbits are unremarkable. Bilateral cataract resection. Other: None. CT CERVICAL SPINE FINDINGS Alignment: Straightening of normal cervical lordosis. Skull base and vertebrae: No acute fracture. Bulky anterior osteophytes from C3-C4 through C6-C7, well corticated lucency through the osteophytes at C4 and C5 may represent remote prior trauma. The dens and skull base are intact. Soft tissues and spinal canal: Debris in the hypopharynx limits assessment for prevertebral edema, no gross prevertebral soft tissue thickening. No evidence of canal hematoma. Disc levels: Near complete disc space loss at C2-C3 with bony ankylosis of the posterior elements on the left. Disc space narrowing at C5-C6 and C6-C7. 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 Bulky anterior osteophytes throughout, some of which are fragmented. Multilevel facet arthropathy. Upper chest: Moderate right pleural effusion, partially included. Other: Soft tissue prominence involving the right aspect of the hypopharynx is  incompletely included in the field of view, causing mild leftward endotracheal and enteric tube deviation. There are dense vascular calcifications. IMPRESSION: 1.  No acute intracranial abnormality.  No skull fracture. 2. Unchanged atrophy, chronic small vessel ischemia, and left parietal encephalomalacia. 3. Advanced multilevel degenerative change in the cervical spine without evidence of acute fracture. 4. Soft tissue prominence involving the right hypopharynx is only partially included in the field of view, this may represent retained secretions or asymmetric positioning of the patient's tongue, however underlying mass lesion is not excluded. Consider contrast enhanced CT of the neck as clinically indicated. 5. Moderate right pleural effusion partially included in the field of view. Electronically Signed   By: Keith Rake M.D.   On: 10/06/2017 21:38   Dg Chest Portable 1 View  Result Date: 10/06/2017 CLINICAL DATA:  Ett tube EXAM: PORTABLE CHEST 1 VIEW COMPARISON:  06/20/2017 FINDINGS: Interval placement of endotracheal tube, tip approximately 3.0 centimeters above the carina. An orogastric tube is in place, tip overlying the level of the LOWER esophagus but difficult to confirm. The heart is enlarged. There is dense opacity in the LEFT lung base. Perihilar opacities are consistent with pulmonary edema. IMPRESSION: Interval placement of endotracheal tube and orogastric tube. LEFT LOWER lobe opacity and pulmonary  edema. Electronically Signed   By: Nolon Nations M.D.   On: 10/06/2017 21:02    ASSESSMENT / PLAN:  Acute respiratory failure secondary to LLL pneumonia and pulmonary edema  Mechanical intubation s/p cardiac arrest  Hx: COPD and Obesity  Full vent support-vent setting reviewed and established SBT once all parameters met  Prn bronchodilator therapy  VAP bundle implemented Hypothermic protocol '@36'  degrees C  Acute on chronic diastolic CHF Cardiac Arrest (cardiac rhythm  unknown) Bradycardia  Hx: HTN, CAD, and Hyperlipidemia  Continuous telemetry monitoring  Trend troponin's  Echo pending Maintain map >65 IV lasix as bp permits  Hold outpatient antihypertensives for now  Cardiology consulted appreciate input Prn atropine for hr <35  Acute on chronic renal failure with hyperkalemia  Metabolic acidosis  Trend BMP Replace electrolytes as indicated  Monitor UOP  Avoid nephrotoxic medications   LLL pneumonia  Diabetic Ulcers Bilateral Lower Extremities Trend WBC and monitor fever curve  Trend PCT and lactic acid Follow cultures  Continue cefepime and vancomycin for now   Anemia without obvious acute blood loss  Trend CBC  Monitor for s/sx of bleeding  Transfuse for hgb <7 IV protonix  VTE px: SCD's avoid chemical prophylaxis   Hyperglycemia  SSI  Acute encephalopathy concerning for possible anoxic brain injury Hx: Dementia, Depression, and Anxiety  Maintain RASS goal -1 during hypothermic protocol  Propofol and fentanyl gtt during hypothermic protocol  Hold outpatient sertraline EEG pending  WUA once cooling process of hypothermic protocol completed  -Pt has 2 brothers I updated them both and answered all questions.  According to pts brothers he does not have any biological children and his wife is deceased.  The pt does have an Alzheimer's history, however pts brothers state he can perform ADL's with assistance, and has occasional confusion but is primarily oriented.  At this time pts brothers would like the pt to remain a FULL CODE.  Marda Stalker, [place] Pager [phone] (please enter 7 digits) PCCM Consult Pager [phone] (please enter 7 digits)

## 2017-10-06 NOTE — ED Notes (Signed)
Pt cleaned up and new chux applied to bed. Pt repositioned in bed.

## 2017-10-07 ENCOUNTER — Encounter: Payer: Self-pay | Admitting: *Deleted

## 2017-10-07 ENCOUNTER — Other Ambulatory Visit: Payer: Self-pay

## 2017-10-07 ENCOUNTER — Inpatient Hospital Stay: Payer: Medicare Other

## 2017-10-07 DIAGNOSIS — J9601 Acute respiratory failure with hypoxia: Secondary | ICD-10-CM | POA: Diagnosis present

## 2017-10-07 DIAGNOSIS — L97219 Non-pressure chronic ulcer of right calf with unspecified severity: Secondary | ICD-10-CM | POA: Diagnosis present

## 2017-10-07 DIAGNOSIS — E1122 Type 2 diabetes mellitus with diabetic chronic kidney disease: Secondary | ICD-10-CM | POA: Diagnosis present

## 2017-10-07 DIAGNOSIS — J189 Pneumonia, unspecified organism: Secondary | ICD-10-CM | POA: Diagnosis not present

## 2017-10-07 DIAGNOSIS — I959 Hypotension, unspecified: Secondary | ICD-10-CM | POA: Diagnosis not present

## 2017-10-07 DIAGNOSIS — G9389 Other specified disorders of brain: Secondary | ICD-10-CM | POA: Diagnosis present

## 2017-10-07 DIAGNOSIS — L97529 Non-pressure chronic ulcer of other part of left foot with unspecified severity: Secondary | ICD-10-CM | POA: Diagnosis present

## 2017-10-07 DIAGNOSIS — Z9911 Dependence on respirator [ventilator] status: Secondary | ICD-10-CM | POA: Diagnosis not present

## 2017-10-07 DIAGNOSIS — N183 Chronic kidney disease, stage 3 (moderate): Secondary | ICD-10-CM | POA: Diagnosis present

## 2017-10-07 DIAGNOSIS — Z515 Encounter for palliative care: Secondary | ICD-10-CM

## 2017-10-07 DIAGNOSIS — L97319 Non-pressure chronic ulcer of right ankle with unspecified severity: Secondary | ICD-10-CM | POA: Diagnosis present

## 2017-10-07 DIAGNOSIS — I5033 Acute on chronic diastolic (congestive) heart failure: Secondary | ICD-10-CM | POA: Diagnosis present

## 2017-10-07 DIAGNOSIS — J181 Lobar pneumonia, unspecified organism: Secondary | ICD-10-CM | POA: Diagnosis present

## 2017-10-07 DIAGNOSIS — I509 Heart failure, unspecified: Secondary | ICD-10-CM | POA: Diagnosis not present

## 2017-10-07 DIAGNOSIS — J440 Chronic obstructive pulmonary disease with acute lower respiratory infection: Secondary | ICD-10-CM | POA: Diagnosis present

## 2017-10-07 DIAGNOSIS — G931 Anoxic brain damage, not elsewhere classified: Secondary | ICD-10-CM | POA: Diagnosis not present

## 2017-10-07 DIAGNOSIS — Z4659 Encounter for fitting and adjustment of other gastrointestinal appliance and device: Secondary | ICD-10-CM | POA: Diagnosis not present

## 2017-10-07 DIAGNOSIS — J410 Simple chronic bronchitis: Secondary | ICD-10-CM | POA: Diagnosis not present

## 2017-10-07 DIAGNOSIS — J9602 Acute respiratory failure with hypercapnia: Secondary | ICD-10-CM | POA: Diagnosis not present

## 2017-10-07 DIAGNOSIS — Z7189 Other specified counseling: Secondary | ICD-10-CM

## 2017-10-07 DIAGNOSIS — I469 Cardiac arrest, cause unspecified: Secondary | ICD-10-CM | POA: Diagnosis present

## 2017-10-07 DIAGNOSIS — Z978 Presence of other specified devices: Secondary | ICD-10-CM | POA: Diagnosis not present

## 2017-10-07 DIAGNOSIS — F0280 Dementia in other diseases classified elsewhere without behavioral disturbance: Secondary | ICD-10-CM | POA: Diagnosis present

## 2017-10-07 DIAGNOSIS — G9349 Other encephalopathy: Secondary | ICD-10-CM | POA: Diagnosis present

## 2017-10-07 DIAGNOSIS — E872 Acidosis: Secondary | ICD-10-CM | POA: Diagnosis present

## 2017-10-07 DIAGNOSIS — G309 Alzheimer's disease, unspecified: Secondary | ICD-10-CM | POA: Diagnosis present

## 2017-10-07 DIAGNOSIS — J449 Chronic obstructive pulmonary disease, unspecified: Secondary | ICD-10-CM | POA: Diagnosis not present

## 2017-10-07 DIAGNOSIS — E870 Hyperosmolality and hypernatremia: Secondary | ICD-10-CM | POA: Diagnosis not present

## 2017-10-07 DIAGNOSIS — J9621 Acute and chronic respiratory failure with hypoxia: Secondary | ICD-10-CM | POA: Diagnosis not present

## 2017-10-07 DIAGNOSIS — E11621 Type 2 diabetes mellitus with foot ulcer: Secondary | ICD-10-CM | POA: Diagnosis present

## 2017-10-07 DIAGNOSIS — Y95 Nosocomial condition: Secondary | ICD-10-CM | POA: Diagnosis present

## 2017-10-07 DIAGNOSIS — I130 Hypertensive heart and chronic kidney disease with heart failure and stage 1 through stage 4 chronic kidney disease, or unspecified chronic kidney disease: Secondary | ICD-10-CM | POA: Diagnosis present

## 2017-10-07 DIAGNOSIS — E875 Hyperkalemia: Secondary | ICD-10-CM | POA: Diagnosis present

## 2017-10-07 DIAGNOSIS — F0390 Unspecified dementia without behavioral disturbance: Secondary | ICD-10-CM | POA: Diagnosis not present

## 2017-10-07 DIAGNOSIS — N179 Acute kidney failure, unspecified: Secondary | ICD-10-CM | POA: Diagnosis present

## 2017-10-07 DIAGNOSIS — R1310 Dysphagia, unspecified: Secondary | ICD-10-CM | POA: Diagnosis not present

## 2017-10-07 DIAGNOSIS — N189 Chronic kidney disease, unspecified: Secondary | ICD-10-CM | POA: Diagnosis not present

## 2017-10-07 DIAGNOSIS — E11622 Type 2 diabetes mellitus with other skin ulcer: Secondary | ICD-10-CM | POA: Diagnosis present

## 2017-10-07 LAB — SALICYLATE LEVEL: Salicylate Lvl: <7 mg/dL (ref 2.8–30.0)

## 2017-10-07 LAB — TROPONIN I
Troponin I: <0.03 ng/mL (ref <0.03)
Troponin I: <0.03 ng/mL (ref <0.03)

## 2017-10-07 LAB — GLUCOSE, CAPILLARY
GLUCOSE-CAPILLARY: 121 mg/dL — AB (ref 70–99)
GLUCOSE-CAPILLARY: 150 mg/dL — AB (ref 70–99)
GLUCOSE-CAPILLARY: 51 mg/dL — AB (ref 70–99)
GLUCOSE-CAPILLARY: 52 mg/dL — AB (ref 70–99)
GLUCOSE-CAPILLARY: 81 mg/dL (ref 70–99)
GLUCOSE-CAPILLARY: 96 mg/dL (ref 70–99)
Glucose-Capillary: 62 mg/dL — ABNORMAL LOW (ref 70–99)
Glucose-Capillary: 63 mg/dL — ABNORMAL LOW (ref 70–99)
Glucose-Capillary: 116 mg/dL — ABNORMAL HIGH (ref 70–99)

## 2017-10-07 LAB — URINE DRUG SCREEN, QUALITATIVE (ARMC ONLY)
AMPHETAMINES, UR SCREEN: NOT DETECTED
BENZODIAZEPINE, UR SCRN: NOT DETECTED
Barbiturates, Ur Screen: NOT DETECTED
CANNABINOID 50 NG, UR ~~LOC~~: NOT DETECTED
Cocaine Metabolite,Ur ~~LOC~~: NOT DETECTED
MDMA (ECSTASY) UR SCREEN: NOT DETECTED
Methadone Scn, Ur: NOT DETECTED
Opiate, Ur Screen: NOT DETECTED
Phencyclidine (PCP) Ur S: NOT DETECTED
TRICYCLIC, UR SCREEN: NOT DETECTED

## 2017-10-07 LAB — CBC WITH DIFFERENTIAL/PLATELET
BAND NEUTROPHILS: 0 %
BASOS ABS: 0 10*3/uL (ref 0–0.1)
BASOS PCT: 0 %
Blasts: 0 %
EOS ABS: 0 10*3/uL (ref 0–0.7)
Eosinophils Relative: 0 %
HCT: 19.7 % — ABNORMAL LOW (ref 40.0–52.0)
Hemoglobin: 6.5 g/dL — ABNORMAL LOW (ref 13.0–18.0)
Lymphocytes Relative: 15 %
Lymphs Abs: 0.9 10*3/uL — ABNORMAL LOW (ref 1.0–3.6)
MCH: 27.3 pg (ref 26.0–34.0)
MCHC: 33.1 g/dL (ref 32.0–36.0)
MCV: 82.5 fL (ref 80.0–100.0)
MONO ABS: 0.3 10*3/uL (ref 0.2–1.0)
MYELOCYTES: 0 %
Metamyelocytes Relative: 0 %
Monocytes Relative: 5 %
Neutro Abs: 4.5 10*3/uL (ref 1.4–6.5)
Neutrophils Relative %: 80 %
Other: 0 %
PLATELETS: 224 10*3/uL (ref 150–440)
PROMYELOCYTES RELATIVE: 0 %
RBC: 2.38 MIL/uL — ABNORMAL LOW (ref 4.40–5.90)
RDW: 18.3 % — ABNORMAL HIGH (ref 11.5–14.5)
WBC: 5.7 10*3/uL (ref 3.8–10.6)
nRBC: 1 /100 WBC — ABNORMAL HIGH

## 2017-10-07 LAB — BLOOD GAS, ARTERIAL
ACID-BASE DEFICIT: 6.4 mmol/L — AB (ref 0.0–2.0)
Acid-Base Excess: 3.5 mmol/L — ABNORMAL HIGH (ref 0.0–2.0)
Bicarbonate: 29.1 mmol/L — ABNORMAL HIGH (ref 20.0–28.0)
Bicarbonate: 21 mmol/L (ref 20.0–28.0)
FIO2: 0.5
FIO2: 0.5
O2 Saturation: 99.2 %
O2 Saturation: 95.9 %
PEEP: 5 cmH2O
PEEP: 5 cmH2O
PH ART: 7.25 — AB (ref 7.350–7.450)
Patient temperature: 33.1
Patient temperature: 37
RATE: 16 resp/min
RATE: 16 resp/min
VT: 500 mL
VT: 500 mL
pCO2 arterial: 47 mmHg (ref 32.0–48.0)
pCO2 arterial: 48 mmHg (ref 32.0–48.0)
pH, Arterial: 7.46 — ABNORMAL HIGH (ref 7.350–7.450)
pO2, Arterial: 122 mmHg — ABNORMAL HIGH (ref 83.0–108.0)
pO2, Arterial: 94 mmHg (ref 83.0–108.0)

## 2017-10-07 LAB — BASIC METABOLIC PANEL
ANION GAP: 6 (ref 5–15)
Anion gap: 9 (ref 5–15)
BUN: 59 mg/dL — ABNORMAL HIGH (ref 8–23)
BUN: 51 mg/dL — ABNORMAL HIGH (ref 8–23)
CALCIUM: 8 mg/dL — AB (ref 8.9–10.3)
CALCIUM: 7.9 mg/dL — AB (ref 8.9–10.3)
CO2: 26 mmol/L (ref 22–32)
CO2: 26 mmol/L (ref 22–32)
Chloride: 101 mmol/L (ref 98–111)
Chloride: 105 mmol/L (ref 98–111)
Creatinine, Ser: 1.6 mg/dL — ABNORMAL HIGH (ref 0.61–1.24)
Creatinine, Ser: 1.46 mg/dL — ABNORMAL HIGH (ref 0.61–1.24)
GFR calc Af Amer: 49 mL/min — ABNORMAL LOW (ref >60)
GFR, EST AFRICAN AMERICAN: 44 mL/min — AB (ref >60)
GFR, EST NON AFRICAN AMERICAN: 38 mL/min — AB (ref >60)
GFR, EST NON AFRICAN AMERICAN: 43 mL/min — AB (ref >60)
Glucose, Bld: 69 mg/dL — ABNORMAL LOW (ref 70–99)
Glucose, Bld: 98 mg/dL (ref 70–99)
Potassium: 4.4 mmol/L (ref 3.5–5.1)
Potassium: 4.6 mmol/L (ref 3.5–5.1)
SODIUM: 137 mmol/L (ref 135–145)
Sodium: 136 mmol/L (ref 135–145)

## 2017-10-07 LAB — URINALYSIS, ROUTINE W REFLEX MICROSCOPIC
Bilirubin Urine: NEGATIVE
Glucose, UA: NEGATIVE mg/dL
HGB URINE DIPSTICK: NEGATIVE
KETONES UR: NEGATIVE mg/dL
LEUKOCYTES UA: NEGATIVE
Nitrite: NEGATIVE
PROTEIN: NEGATIVE mg/dL
Specific Gravity, Urine: 1.008 (ref 1.005–1.030)
pH: 5 (ref 5.0–8.0)

## 2017-10-07 LAB — PREPARE RBC (CROSSMATCH)

## 2017-10-07 LAB — APTT: APTT: 35 s (ref 24–36)

## 2017-10-07 LAB — PROTIME-INR
INR: 1.35
PROTHROMBIN TIME: 16.6 s — AB (ref 11.4–15.2)

## 2017-10-07 LAB — MRSA PCR SCREENING: MRSA by PCR: NEGATIVE

## 2017-10-07 LAB — POTASSIUM: POTASSIUM: 4.7 mmol/L (ref 3.5–5.1)

## 2017-10-07 LAB — PROCALCITONIN: Procalcitonin: <0.1 ng/mL

## 2017-10-07 LAB — MAGNESIUM: MAGNESIUM: 2.8 mg/dL — AB (ref 1.7–2.4)

## 2017-10-07 LAB — ACETAMINOPHEN LEVEL

## 2017-10-07 LAB — PHOSPHORUS: PHOSPHORUS: 5 mg/dL — AB (ref 2.5–4.6)

## 2017-10-07 LAB — ETHANOL: Alcohol, Ethyl (B): <10 mg/dL (ref <10)

## 2017-10-07 MED ORDER — BISACODYL 5 MG PO TBEC
5.0000 mg | DELAYED_RELEASE_TABLET | Freq: Every day | ORAL | Status: DC | PRN
  Administered 2017-10-26: 5 mg via ORAL
  Filled 2017-10-07: qty 1

## 2017-10-07 MED ORDER — FENTANYL CITRATE (PF) 100 MCG/2ML IJ SOLN
INTRAMUSCULAR | Status: AC
  Filled 2017-10-07: qty 2

## 2017-10-07 MED ORDER — VANCOMYCIN HCL 10 G IV SOLR
1500.0000 mg | INTRAVENOUS | Status: DC
  Administered 2017-10-07: 1500 mg via INTRAVENOUS
  Filled 2017-10-07: qty 1500

## 2017-10-07 MED ORDER — ATROPINE SULFATE 1 MG/10ML IJ SOSY
1.0000 mg | PREFILLED_SYRINGE | INTRAMUSCULAR | Status: DC | PRN
  Administered 2017-10-07: 1 mg via INTRAVENOUS
  Filled 2017-10-07 (×2): qty 10

## 2017-10-07 MED ORDER — FENTANYL BOLUS VIA INFUSION
25.0000 ug | INTRAVENOUS | Status: DC | PRN
  Administered 2017-10-08 – 2017-10-13 (×9): 25 ug via INTRAVENOUS
  Filled 2017-10-07: qty 25

## 2017-10-07 MED ORDER — PROPOFOL 1000 MG/100ML IV EMUL
5.0000 ug/kg/min | INTRAVENOUS | Status: DC
  Administered 2017-10-07: 10 ug/kg/min via INTRAVENOUS
  Administered 2017-10-07: 35 ug/kg/min via INTRAVENOUS
  Administered 2017-10-07 (×3): 40 ug/kg/min via INTRAVENOUS
  Administered 2017-10-07: 35 ug/kg/min via INTRAVENOUS
  Administered 2017-10-08 (×2): 30 ug/kg/min via INTRAVENOUS
  Administered 2017-10-08 (×4): 35 ug/kg/min via INTRAVENOUS
  Administered 2017-10-08: 30 ug/kg/min via INTRAVENOUS
  Administered 2017-10-09: 35 ug/kg/min via INTRAVENOUS
  Administered 2017-10-09 (×5): 40 ug/kg/min via INTRAVENOUS
  Administered 2017-10-10 – 2017-10-14 (×3): 35 ug/kg/min via INTRAVENOUS
  Administered 2017-10-14: 10 ug/kg/min via INTRAVENOUS
  Administered 2017-10-14 – 2017-10-15 (×3): 35 ug/kg/min via INTRAVENOUS
  Administered 2017-10-15: 40 ug/kg/min via INTRAVENOUS
  Administered 2017-10-15: 45 ug/kg/min via INTRAVENOUS
  Administered 2017-10-15 (×2): 40 ug/kg/min via INTRAVENOUS
  Administered 2017-10-15 (×2): 35 ug/kg/min via INTRAVENOUS
  Administered 2017-10-16 (×2): 40 ug/kg/min via INTRAVENOUS
  Administered 2017-10-16: 30 ug/kg/min via INTRAVENOUS
  Administered 2017-10-16: 35 ug/kg/min via INTRAVENOUS
  Administered 2017-10-16 (×2): 40 ug/kg/min via INTRAVENOUS
  Administered 2017-10-16: 30 ug/kg/min via INTRAVENOUS
  Administered 2017-10-17 (×4): 40 ug/kg/min via INTRAVENOUS
  Administered 2017-10-17: 25.74 ug/kg/min via INTRAVENOUS
  Administered 2017-10-17 – 2017-10-18 (×7): 35 ug/kg/min via INTRAVENOUS
  Administered 2017-10-19: 40 ug/kg/min via INTRAVENOUS
  Administered 2017-10-19: 20 ug/kg/min via INTRAVENOUS
  Administered 2017-10-19: 40 ug/kg/min via INTRAVENOUS
  Administered 2017-10-19 (×3): 35 ug/kg/min via INTRAVENOUS
  Administered 2017-10-20 – 2017-10-21 (×9): 40 ug/kg/min via INTRAVENOUS
  Administered 2017-10-21: 50 ug/kg/min via INTRAVENOUS
  Administered 2017-10-21: 40 ug/kg/min via INTRAVENOUS
  Administered 2017-10-21: 50 ug/kg/min via INTRAVENOUS
  Filled 2017-10-07 (×70): qty 100

## 2017-10-07 MED ORDER — VALPROATE SODIUM 500 MG/5ML IV SOLN: 500.0000 mg | Freq: Every day | INTRAVENOUS | Status: DC

## 2017-10-07 MED ORDER — FENTANYL 2500MCG IN NS 250ML (10MCG/ML) PREMIX INFUSION
25.0000 ug/h | INTRAVENOUS | Status: DC
  Administered 2017-10-07: 100 ug/h via INTRAVENOUS
  Administered 2017-10-07: 200 ug/h via INTRAVENOUS
  Administered 2017-10-08: 225 ug/h via INTRAVENOUS
  Administered 2017-10-08: 100 ug/h via INTRAVENOUS
  Administered 2017-10-09 – 2017-10-10 (×2): 150 ug/h via INTRAVENOUS
  Administered 2017-10-11 (×2): 300 ug/h via INTRAVENOUS
  Administered 2017-10-11 – 2017-10-12 (×2): 200 ug/h via INTRAVENOUS
  Administered 2017-10-12: 300 ug/h via INTRAVENOUS
  Administered 2017-10-13 (×2): 350 ug/h via INTRAVENOUS
  Administered 2017-10-13: 300 ug/h via INTRAVENOUS
  Administered 2017-10-14 – 2017-10-17 (×11): 350 ug/h via INTRAVENOUS
  Administered 2017-10-17: 35 ug/h via INTRAVENOUS
  Administered 2017-10-17 – 2017-10-19 (×6): 350 ug/h via INTRAVENOUS
  Administered 2017-10-19 – 2017-10-20 (×4): 300 ug/h via INTRAVENOUS
  Administered 2017-10-21: 200 ug/h via INTRAVENOUS
  Administered 2017-10-21 – 2017-10-22 (×2): 250 ug/h via INTRAVENOUS
  Administered 2017-10-22: 350 ug/h via INTRAVENOUS
  Administered 2017-10-23: 100 ug/h via INTRAVENOUS
  Administered 2017-10-24: 50 ug/h via INTRAVENOUS
  Administered 2017-10-24 – 2017-10-25 (×3): 100 ug/h via INTRAVENOUS
  Filled 2017-10-07 (×43): qty 250

## 2017-10-07 MED ORDER — MIDAZOLAM HCL 2 MG/2ML IJ SOLN
INTRAMUSCULAR | Status: AC
  Filled 2017-10-07: qty 2

## 2017-10-07 MED ORDER — INSULIN ASPART 100 UNIT/ML ~~LOC~~ SOLN
2.0000 [IU] | SUBCUTANEOUS | Status: DC
  Administered 2017-10-08: 6 [IU] via SUBCUTANEOUS
  Administered 2017-10-08 (×3): 2 [IU] via SUBCUTANEOUS
  Administered 2017-10-08: 4 [IU] via SUBCUTANEOUS
  Administered 2017-10-08: 2 [IU] via SUBCUTANEOUS
  Administered 2017-10-08 – 2017-10-09 (×2): 4 [IU] via SUBCUTANEOUS
  Administered 2017-10-09: 6 [IU] via SUBCUTANEOUS
  Administered 2017-10-09 (×2): 4 [IU] via SUBCUTANEOUS
  Administered 2017-10-09 – 2017-10-10 (×6): 6 [IU] via SUBCUTANEOUS
  Filled 2017-10-07 (×17): qty 1

## 2017-10-07 MED ORDER — DEXTROSE 50 % IV SOLN
INTRAVENOUS | Status: AC
  Filled 2017-10-07: qty 50

## 2017-10-07 MED ORDER — INFLUENZA VAC SPLIT HIGH-DOSE 0.5 ML IM SUSY
0.5000 mL | PREFILLED_SYRINGE | INTRAMUSCULAR | Status: DC
  Filled 2017-10-07: qty 0.5

## 2017-10-07 MED ORDER — VECURONIUM BROMIDE 10 MG IV SOLR
INTRAVENOUS | Status: AC
  Administered 2017-10-07: 10 mg
  Filled 2017-10-07: qty 10

## 2017-10-07 MED ORDER — FENTANYL CITRATE (PF) 100 MCG/2ML IJ SOLN
50.0000 ug | Freq: Once | INTRAMUSCULAR | Status: AC
  Administered 2017-10-07: 50 ug via INTRAVENOUS

## 2017-10-07 MED ORDER — ORAL CARE MOUTH RINSE
15.0000 mL | OROMUCOSAL | Status: DC
  Administered 2017-10-07 – 2017-10-27 (×200): 15 mL via OROMUCOSAL

## 2017-10-07 MED ORDER — HEPARIN SODIUM (PORCINE) 5000 UNIT/ML IJ SOLN
5000.0000 [IU] | Freq: Three times a day (TID) | INTRAMUSCULAR | Status: DC
  Administered 2017-10-07 – 2017-10-22 (×44): 5000 [IU] via SUBCUTANEOUS
  Filled 2017-10-07 (×43): qty 1

## 2017-10-07 MED ORDER — VALPROATE SODIUM 500 MG/5ML IV SOLN
250.0000 mg | Freq: Three times a day (TID) | INTRAVENOUS | Status: DC
  Administered 2017-10-07 – 2017-10-20 (×39): 250 mg via INTRAVENOUS
  Filled 2017-10-07 (×46): qty 2.5

## 2017-10-07 MED ORDER — DEXTROSE 50 % IV SOLN
25.0000 g | Freq: Once | INTRAVENOUS | Status: AC
  Administered 2017-10-07: 25 g via INTRAVENOUS

## 2017-10-07 MED ORDER — ADULT MULTIVITAMIN LIQUID CH
15.0000 mL | Freq: Every day | ORAL | Status: DC
  Administered 2017-10-07 – 2017-10-27 (×20): 15 mL
  Filled 2017-10-07 (×22): qty 15

## 2017-10-07 MED ORDER — ONDANSETRON HCL 4 MG/2ML IJ SOLN
4.0000 mg | Freq: Four times a day (QID) | INTRAMUSCULAR | Status: DC | PRN
  Administered 2017-10-27: 4 mg via INTRAVENOUS
  Filled 2017-10-07: qty 2

## 2017-10-07 MED ORDER — CHLORHEXIDINE GLUCONATE 0.12% ORAL RINSE (MEDLINE KIT)
15.0000 mL | Freq: Two times a day (BID) | OROMUCOSAL | Status: DC
  Administered 2017-10-07 – 2017-10-27 (×41): 15 mL via OROMUCOSAL

## 2017-10-07 MED ORDER — POLYETHYLENE GLYCOL 3350 17 G PO PACK
17.0000 g | PACK | Freq: Every day | ORAL | Status: DC
  Administered 2017-10-07 – 2017-10-20 (×13): 17 g
  Filled 2017-10-07 (×13): qty 1

## 2017-10-07 MED ORDER — FAMOTIDINE IN NACL 20-0.9 MG/50ML-% IV SOLN
20.0000 mg | Freq: Two times a day (BID) | INTRAVENOUS | Status: DC
  Administered 2017-10-07 – 2017-10-12 (×12): 20 mg via INTRAVENOUS
  Filled 2017-10-07 (×12): qty 50

## 2017-10-07 MED ORDER — DOCUSATE SODIUM 100 MG PO CAPS: 100.0000 mg | ORAL_CAPSULE | Freq: Two times a day (BID) | ORAL | Status: DC

## 2017-10-07 MED ORDER — ROCURONIUM BROMIDE 50 MG/5ML IV SOLN
INTRAVENOUS | Status: AC
  Filled 2017-10-07: qty 1

## 2017-10-07 MED ORDER — ASPIRIN 300 MG RE SUPP: 300.0000 mg | RECTAL | Status: DC

## 2017-10-07 MED ORDER — ONDANSETRON HCL 4 MG PO TABS: 4.0000 mg | ORAL_TABLET | Freq: Four times a day (QID) | ORAL | Status: DC | PRN

## 2017-10-07 MED ORDER — NOREPINEPHRINE 4 MG/250ML-% IV SOLN
0.0000 ug/min | INTRAVENOUS | Status: DC
  Administered 2017-10-07 – 2017-10-08 (×2): 2 ug/min via INTRAVENOUS
  Filled 2017-10-07 (×2): qty 250

## 2017-10-07 MED ORDER — SODIUM CHLORIDE 0.9 % IV SOLN
2.0000 g | INTRAVENOUS | Status: DC
  Filled 2017-10-07: qty 2

## 2017-10-07 MED ORDER — SODIUM CHLORIDE 0.9 % IV SOLN
2.0000 g | Freq: Two times a day (BID) | INTRAVENOUS | Status: DC
  Administered 2017-10-07 – 2017-10-09 (×5): 2 g via INTRAVENOUS
  Filled 2017-10-07 (×6): qty 2

## 2017-10-07 MED ORDER — SODIUM CHLORIDE 0.9 % IV SOLN
INTRAVENOUS | Status: DC
  Administered 2017-10-07: 50 mL/h via INTRAVENOUS
  Administered 2017-10-07 – 2017-10-10 (×4): via INTRAVENOUS

## 2017-10-07 MED ORDER — VITAL HIGH PROTEIN PO LIQD
1000.0000 mL | ORAL | Status: DC
  Administered 2017-10-07 – 2017-10-16 (×13): 1000 mL

## 2017-10-07 MED ORDER — PRO-STAT SUGAR FREE PO LIQD
60.0000 mL | Freq: Four times a day (QID) | ORAL | Status: DC
  Administered 2017-10-07 – 2017-10-17 (×38): 60 mL

## 2017-10-07 MED ORDER — VALPROATE SODIUM 500 MG/5ML IV SOLN: 250.0000 mg | Freq: Every day | INTRAVENOUS | Status: DC

## 2017-10-07 MED ORDER — ACETAMINOPHEN 325 MG PO TABS: 650.0000 mg | ORAL_TABLET | Freq: Four times a day (QID) | ORAL | Status: DC | PRN

## 2017-10-07 MED ORDER — HYDROCODONE-ACETAMINOPHEN 5-325 MG PO TABS
1.0000 | ORAL_TABLET | ORAL | Status: DC | PRN
  Administered 2017-10-11 – 2017-10-12 (×4): 2 via ORAL
  Filled 2017-10-07 (×4): qty 2

## 2017-10-07 MED ORDER — TRAZODONE HCL 50 MG PO TABS
25.0000 mg | ORAL_TABLET | Freq: Every evening | ORAL | Status: DC | PRN
  Administered 2017-10-11: 25 mg via ORAL
  Filled 2017-10-07: qty 1

## 2017-10-07 MED ORDER — ACETAMINOPHEN 650 MG RE SUPP: 650.0000 mg | Freq: Four times a day (QID) | RECTAL | Status: DC | PRN

## 2017-10-07 MED ORDER — SENNOSIDES-DOCUSATE SODIUM 8.6-50 MG PO TABS
2.0000 | ORAL_TABLET | Freq: Two times a day (BID) | ORAL | Status: DC
  Administered 2017-10-07 – 2017-10-20 (×28): 2
  Filled 2017-10-07 (×28): qty 2

## 2017-10-07 MED ORDER — ETOMIDATE 2 MG/ML IV SOLN
INTRAVENOUS | Status: AC
  Filled 2017-10-07: qty 10

## 2017-10-07 NOTE — Progress Notes (Signed)
Initial Nutrition Assessment  DOCUMENTATION CODES:   Obesity unspecified  INTERVENTION:  Once OGT placed and confirmed, initiate Vital High Protein at 30 mL/hr (720 mL goal daily volume) + Pro-Stat 60 mL QID. Provides 1520 kcal, 183 grams of protein, 605 mL H2O daily. With current propofol rate provides 1725 kcal daily.  Provide liquid MVI daily per tube as goal TF regimen does not meet 100% RDIs for vitamins/minerals.  Provide minimal free water flush of 30 mL Q4hrs to maintain tube patency.  NUTRITION DIAGNOSIS:   Inadequate oral intake related to inability to eat as evidenced by NPO status.  GOAL:   Provide needs based on ASPEN/SCCM guidelines  MONITOR:   Vent status, Labs, Weight trends, TF tolerance, Skin, I & O's  REASON FOR ASSESSMENT:   Ventilator    ASSESSMENT:   84 year old male with PMHx of chronic diastolic CHF, COPD, CAD, DM, HTN, GERD, HLD, anxiety, depression, CKD stage III, dementia who is admitted from [facility] after cardiac arrest, intubated on 9/30, 36C TTM protocol initiated, with possible LLL PNA, pulmonary edema, acute on chronic renal failure.   -Patient self-extubated today and was subsequently re-intubated. Plan is for placement of another OGT.  Patient intubated and sedated. On PRVC mode with FiO2 40% and PEEP 5 cmH2O. On 36C TTM with abdominal pads in place.  Enteral Access: no access at this time; pending placement of another OGT  MAP: 68-94 mmHg  Patient is currently intubated on ventilator support Ve: 8 L/min Temp (24hrs), Avg:94.2 F (34.6 C), Min:91.6 F (33.1 C), Max:96.8 F (36 C)  Propofol: 7.77 mL/hr (205 kcal daily)  Medications reviewed and include: Novolog 2-6 units Q4hrs, Miralax 17 grams daily per tube, senna-docusate 2 tablets BID, NS @ 50 mL/hr, cefepime, famotidine, fentanyl gtt, norepinephrine gtt now off, propofol gtt, valproate.  Labs reviewed: CBG 62-116, BUN 59, Creatinine 1.6.  I/O: 1400 mL UOP  overnight  Patient does not meet criteria for malnutrition at this time.  Discussed with RN and on rounds. Plan is to initiate tube feeds today.  NUTRITION - FOCUSED PHYSICAL EXAM:    Most Recent Value  Orbital Region  Moderate depletion  Upper Arm Region  No depletion  Thoracic and Lumbar Region  Unable to assess  Buccal Region  Unable to assess  Temple Region  Moderate depletion  Clavicle Bone Region  Mild depletion  Clavicle and Acromion Bone Region  No depletion  Scapular Bone Region  Unable to assess  Dorsal Hand  No depletion  Patellar Region  Unable to assess  Anterior Thigh Region  Unable to assess  Posterior Calf Region  Unable to assess  Edema (RD Assessment)  Severe  Hair  Reviewed  Eyes  Unable to assess  Mouth  Unable to assess  Skin  Reviewed  Nails  Reviewed     Diet Order:   Diet Order            Diet NPO time specified  Diet effective now              EDUCATION NEEDS:   No education needs have been identified at this time  Skin:  Skin Assessment: Skin Integrity Issues:(cellulitis and DM ulcers to bilateral lower extremities)  Last BM:  Unknown/PTA  Height:   Ht Readings from Last 1 Encounters:  10/07/17 6\' 3"  (1.905 m)    Weight:   Wt Readings from Last 1 Encounters:  10/07/17 125.8 kg    Ideal Body Weight:  89.1  kg  BMI:  Body mass index is 34.66 kg/m.  Estimated Nutritional Needs:   Kcal:  2956-2130 (11-14 kcal/kg)  Protein:  178 grams (2 grams/kg IBW)  Fluid:  1.5-1.8 L/day  Helane Rima, MS, RD, LDN Office: [phone] Pager: [phone] After Hours/Weekend Pager: [phone]

## 2017-10-07 NOTE — Progress Notes (Signed)
Pharmacy Antibiotic Note  Billy Liu is a 84 y.o. male admitted on 10/06/2017 s/p cardiac arrest at facility. Concern for pneumonia for which patient was started on vancomycin and cefepime. Pharmacy has been consulted for cefepime dosing.  Plan: Per CCM on ICU rounds 10/1 am, will discontinue vancomycin for negative MRSA PCR Continue cefepime 2 g IV q12h  Height: 6\' 3"  (190.5 cm) Weight: 277 lb 5.4 oz (125.8 kg) IBW/kg (Calculated) : 84.5  Temp (24hrs), Avg:94.1 F (34.5 C), Min:91.6 F (33.1 C), Max:96.8 F (36 C)  Recent Labs  Lab 10/06/17 2016 10/07/17 0606 10/07/17 0806  WBC 9.4 5.7  --   CREATININE 1.98*  --  1.60*    Estimated Creatinine Clearance: 50 mL/min (A) (by C-G formula based on SCr of 1.6 mg/dL (H)).    Allergies  Allergen Reactions  . Penicillins Other (See Comments)    Per MAR Has patient had a PCN reaction causing immediate rash, facial/tongue/throat swelling, SOB or lightheadedness with hypotension: Unknown Has patient had a PCN reaction causing severe rash involving mucus membranes or skin necrosis: Unknown Has patient had a PCN reaction that required hospitalization: Unknown Has patient had a PCN reaction occurring within the last 10 years: Unknown If all of the above answers are "NO", then may proceed with Cephalosporin use.     Antimicrobials this admission: Vancomycin 10/1 x 2 doses Cefepime 9/30 >>  Dose adjustments this admission: N/A  Microbiology results: 9/30 BCx: NG 12 hours 10/1 UCx: pending  10/1 Wound (left toe): pending 10/1 Wound (right leg): pending 10/1 MRSA PCR: negative  Thank you for allowing pharmacy to be a part of this patient's care.  Pricilla Riffle, PharmD Pharmacy Resident  10/07/2017 11:43 AM

## 2017-10-07 NOTE — Consult Note (Addendum)
Consultation Note Date: 10/07/2017   Patient Name: Billy Liu  DOB: 1932/12/01  MRN: [id number]  Age / Sex: 84 y.o., male  PCP: Alvester Morin, MD Referring Physician: Loletha Grayer, MD  Reason for Consultation: Establishing goals of care  HPI/Patient Profile: This is an 84 yo male with a PMH of Obesity, HTN, Hyperlipidemia, GERD, Diabetes Mellitus, Depression, Alzheimer's, CAD, COPD, CKD stage III, Chronic Diastolic CHF, and Anxiety.  He presented to [facility] ER on 09/30 from [facility] after being found pulseless on the bathroom floor by staff.  Per ER notes several rounds of CPR initiated by staff at [facility]. Initial cardiac rhythm unknown, pt did not receive medications or defibrillation during CPR.  Upon EMS arrival pt had a pulse cardiac rhythm bradycardia, he required bag mask ventilation due to agonal respirations with hypoxia.  Upon arrival to the ER pt mechanically intubated by ER physician and cooling protocol initiated.   Clinical Assessment and Goals of Care: Patient is resting in bed intubated. Brothers [name] and Michael at bedside. He has been living at his facility for the past 10 years. He requires assistance with ADL's. He is able to feed himself and has a good appetite. He uses a wheelchair.   The brothers are next of kin and state they want everything done possible at this time. They state when God is ready, he will take him and the interventions will not work. Discussed quality of life. Will take one day at a time.    Will follow and discuss further.     SUMMARY OF RECOMMENDATIONS   Continue full scope of treatment.    Code Status/Advance Care Planning:  Full code    Symptom Management:   Per primary team  Palliative Prophylaxis:   Eye Care and Oral Care   Psycho-social/Spiritual:   Desire for further Chaplaincy support:following   Prognosis:   Poor.    Discharge Planning: To Be Determined      Primary Diagnoses: Present on Admission: . Cardiac arrest ([place])   I have reviewed the medical record, interviewed the patient and family, and examined the patient. The following aspects are pertinent.  Past Medical History:  Diagnosis Date  . Anxiety   . Cellulitis 10/17/2014   left lower leg  . Chronic diastolic CHF (congestive heart failure) ([place])   . CKD (chronic kidney disease), stage III ([place])   . COPD (chronic obstructive pulmonary disease) ([place])   . Coronary artery disease   . Dementia ([place])    Alzheimer's   . Depression   . Diabetes mellitus without complication ([place])   . GERD (gastroesophageal reflux disease)   . Hyperlipemia   . Hypertension   . Obesity   . Renal disorder    Social History   Socioeconomic History  . Marital status: Married    Spouse name: Not on file  . Number of children: Not on file  . Years of education: Not on file  . Highest education level: Not on file  Occupational History  . Not on  file  Social Needs  . Financial resource strain: Not on file  . Food insecurity:    Worry: Not on file    Inability: Not on file  . Transportation needs:    Medical: Not on file    Non-medical: Not on file  Tobacco Use  . Smoking status: Never Smoker  . Smokeless tobacco: Former Systems developer    Types: Chew  Substance and Sexual Activity  . Alcohol use: No  . Drug use: No  . Sexual activity: Not on file  Lifestyle  . Physical activity:    Days per week: Not on file    Minutes per session: Not on file  . Stress: Not on file  Relationships  . Social connections:    Talks on phone: Not on file    Gets together: Not on file    Attends religious service: Not on file    Active member of club or organization: Not on file    Attends meetings of clubs or organizations: Not on file    Relationship status: Not on file  Other Topics Concern  . Not on file  Social History Narrative  . Not on file   Family  History  Problem Relation Age of Onset  . Cancer Brother    Scheduled Meds: . chlorhexidine gluconate (MEDLINE KIT)  15 mL Mouth Rinse BID  . feeding supplement (PRO-STAT SUGAR FREE 64)  60 mL Per Tube QID  . feeding supplement (VITAL HIGH PROTEIN)  1,000 mL Per Tube Q24H  . heparin  5,000 Units Subcutaneous Q8H  . [START ON 10/08/2017] Influenza vac split quadrivalent PF  0.5 mL Intramuscular Tomorrow-1000  . insulin aspart  2-6 Units Subcutaneous Q4H  . mouth rinse  15 mL Mouth Rinse 10 times per day  . multivitamin  15 mL Per Tube Daily  . polyethylene glycol  17 g Per Tube Daily  . senna-docusate  2 tablet Per Tube BID   Continuous Infusions: . sodium chloride 50 mL/hr (10/07/17 1442)  . ceFEPime (MAXIPIME) IV Stopped (10/07/17 0604)  . famotidine (PEPCID) IV Stopped (10/07/17 1155)  . fentaNYL infusion INTRAVENOUS 100 mcg/hr (10/07/17 1200)  . norepinephrine (LEVOPHED) Adult infusion Stopped (10/07/17 0820)  . propofol (DIPRIVAN) infusion 10 mcg/kg/min (10/07/17 1200)  . valproate sodium 52.5 mL/hr at 10/07/17 1200   PRN Meds:.acetaminophen **OR** acetaminophen, atropine, bisacodyl, fentaNYL, HYDROcodone-acetaminophen, ondansetron **OR** ondansetron (ZOFRAN) IV, traZODone Medications Prior to Admission:  Prior to Admission medications   Medication Sig Start Date End Date Taking? Authorizing Provider  acetaminophen (TYLENOL) 500 MG tablet Take 500 mg by mouth 3 (three) times daily.   Yes [provider]  amLODipine (NORVASC) 10 MG tablet Take 10 mg by mouth daily.   Yes [provider]  aspirin EC 81 MG tablet Take 81 mg by mouth daily.   Yes [provider]  Cholecalciferol (VITAMIN D) 2000 units tablet Take 2,000 Units by mouth daily.   Yes [provider]  cloNIDine (CATAPRES) 0.2 MG tablet Take 1 tablet (0.2 mg total) by mouth 3 (three) times daily. 10/31/14  Yes Henreitta Leber, MD  divalproex (DEPAKOTE) 250 MG DR tablet Take 250 mg by  mouth daily.    Yes [provider]  divalproex (DEPAKOTE) 500 MG DR tablet Take 500 mg by mouth at bedtime.   Yes [provider]  ferrous sulfate 324 (65 Fe) MG TBEC Take 324 mg by mouth daily.   Yes [provider]  finasteride (PROSCAR) 5 MG tablet  Take 1 tablet (5 mg total) by mouth daily. 10/31/14  Yes Sainani, Belia Heman, MD  glucagon, human recombinant, (GLUCAGEN DIAGNOSTIC) 1 MG injection Inject 1 mg into the muscle once as needed for low blood sugar.   Yes [provider]  hydrALAZINE (APRESOLINE) 100 MG tablet Take 100 mg by mouth 3 (three) times daily.   Yes [provider]  insulin detemir (LEVEMIR) 100 UNIT/ML injection Inject 40 Units into the skin 2 (two) times daily.    Yes [provider]  metoprolol succinate (TOPROL-XL) 100 MG 24 hr tablet Take 300 mg by mouth daily.    Yes [provider]  pantoprazole (PROTONIX) 40 MG tablet Take 1 tablet (40 mg total) by mouth daily. 06/23/17  Yes Sudini, Alveta Heimlich, MD  polyethylene glycol (MIRALAX / GLYCOLAX) packet Take 17 g by mouth daily.    Yes [provider]  senna (SENOKOT) 8.6 MG TABS tablet Take 2 tablets by mouth at bedtime.   Yes [provider]  sertraline (ZOLOFT) 25 MG tablet Take 75 mg by mouth daily.    Yes [provider]  simvastatin (ZOCOR) 20 MG tablet Take 20 mg by mouth at bedtime.    Yes [provider]  Sunscreens (AVEENO DAILY MOISTURIZER) LOTN Apply topically at bedtime. Apply to bilateral feet and legs   Yes [provider]  torsemide (DEMADEX) 20 MG tablet Take 2 tablets (40 mg total) by mouth daily. 10/31/14  Yes Sainani, Belia Heman, MD  vitamin C (ASCORBIC ACID) 500 MG tablet Take 500 mg by mouth daily.   Yes [provider]  omeprazole (PRILOSEC) 40 MG capsule Take 1 capsule (40 mg total) by mouth 2 (two) times daily for 14 days. 06/25/17 07/09/17  Lin Landsman, MD   Allergies  Allergen Reactions  .  Penicillins Other (See Comments)    Per MAR Has patient had a PCN reaction causing immediate rash, facial/tongue/throat swelling, SOB or lightheadedness with hypotension: Unknown Has patient had a PCN reaction causing severe rash involving mucus membranes or skin necrosis: Unknown Has patient had a PCN reaction that required hospitalization: Unknown Has patient had a PCN reaction occurring within the last 10 years: Unknown If all of the above answers are "NO", then may proceed with Cephalosporin use.    Review of Systems  Unable to perform ROS   Physical Exam  Constitutional:  Hypothermia protocol.  Pulmonary/Chest:  Intubated    Vital Signs: BP (!) 115/47   Pulse (!) 52   Temp (!) 96.8 F (36 C) (Rectal)   Resp 17   Ht _0  (1.905 m)   Wt 125.8 kg   SpO2 99%   BMI 34.66 kg/m  Pain Scale: CPOT   Pain Score: 0-No pain   SpO2: SpO2: 99 % O2 Device:SpO2: 99 % O2 Flow Rate: .   IO: Intake/output summary:   Intake/Output Summary (Last 24 hours) at 10/07/2017 1547 Last data filed at 10/07/2017 1200 Gross per 24 hour  Intake 2619.11 ml  Output 1400 ml  Net 1219.11 ml    LBM: Last BM Date: (UTA) Baseline Weight: Weight: 129.5 kg(bed weight) Most recent weight: Weight: 125.8 kg     Palliative Assessment/Data:     Time In: 3:00 Time Out: 3:50 Time Total: 50 min Greater than 50%  of this time was spent counseling and coordinating care related to the above assessment and plan.  Signed by: Asencion Gowda, NP   Please contact Palliative Medicine Team phone at [phone] for  questions and concerns.  For individual provider: See Amion             

## 2017-10-07 NOTE — ED Notes (Signed)
Late entry:  ICU nurse called regarding OG which was placed but placement is not ideal per KUB (RN address)

## 2017-10-07 NOTE — Procedures (Signed)
ELECTROENCEPHALOGRAM REPORT   Patient: Billy Liu       Room #: IC06A-AA EEG No. ID: [id number] Age: 84 y.o.        Sex: male Referring Physician: Kasa Report Date:  10/07/2017        Interpreting Physician: Thana Farr  History: Ryu Cerreta is an 84 y.o. male s/p arrest  Medications:  Cefepime, Pepcid, Insulin, MVI, Miralax, Senokot, Depacon, Fentanyl, Levophed, Diprovan  Conditions of Recording:  This is a 21 channel routine scalp EEG performed with bipolar and monopolar montages arranged in accordance to the international 10/20 system of electrode placement. One channel was dedicated to EKG recording.  The patient is in the intubated and sedated state.  Description:  The background activity is slow and poorly organized.  It consists of a low voltage, mixture of theta and delta activity that is diffusely distributed.  Over the left centro-parietal region though this activity seems to be of higher voltage and includes faster rhythms.    No epileptiform activity is noted.   When the patient is awake the background activity is unable to be visualized due to artifact.  The patient is quite uncooperative during this time.   Hyperventilation and intermittent photic stimulation were not performed   IMPRESSION: This is an abnormal EEG secondary to general background slowing.  This finding may be seen with a diffuse disturbance that is etiologically nonspecific, but may include a metabolic encephalopathy, among other possibilities.  No epileptiform activity was noted.  The focal findings over the left centro-parietal region have unclear significance and may artifactual.  Clinical correlation correlation recommended.     Thana Farr, MD Neurology [phone] 10/07/2017, 3:31 PM

## 2017-10-07 NOTE — Care Management (Signed)
Patient is long term care resident at [facility]. Was found unresponsive at the facility.  Palliative is following for goals.  Patient is full code.

## 2017-10-07 NOTE — Progress Notes (Addendum)
Patient ID: Billy Liu, male   DOB: 02-08-1932, 84 y.o.   MRN: [id number]  [facility] PROGRESS NOTE  Billy Liu FBP:[id number] DOB: 11-Aug-1932 DOA: 10/06/2017 PCP: Alvester Morin, MD  HPI/Subjective: Patient unresponsive to sternal rub.  Status post cardiac arrest  Objective: Vitals:   10/07/17 1500 10/07/17 1530  BP: (!) 115/47 (!) 117/49  Pulse: (!) 52 (!) 53  Resp: 17 18  Temp: (!) 96.8 F (36 C)   SpO2: 99% 99%    Filed Weights   10/06/17 2039 10/07/17 0200  Weight: 129.5 kg 125.8 kg    ROS: Review of Systems  Unable to perform ROS: Acuity of condition   Exam: Physical Exam  Constitutional: He is intubated.  HENT:  Nose: Mucosal edema present.  Unable to look into mouth  Eyes: Conjunctivae and lids are normal.  Pupils pinpoint  Neck: Carotid bruit is not present.  Cardiovascular: Regular rhythm, S1 normal, S2 normal and normal heart sounds.  Respiratory: He is intubated. He has decreased breath sounds in the right lower field and the left lower field. He has no wheezes. He has rhonchi in the right lower field and the left lower field.  GI: Bowel sounds are normal. There is tenderness.  Musculoskeletal:       Right ankle: He exhibits swelling.       Left ankle: He exhibits swelling.  Neurological: He is unresponsive.  Skin: Skin is warm.  Right posterior calf large area of greenish discoloration and foul-smelling in an ulcer.  Left foot first toe with small ulceration and very swollen first toe.  Likely from chronic infection  Psychiatric:  Unresponsive to sternal rub      Data Reviewed: Basic Metabolic Panel: Recent Labs  Lab 10/06/17 2016 10/07/17 0127 10/07/17 0409 10/07/17 0806  NA 132*  --   --  136  K 6.3*  --  4.7 4.4  CL 98  --   --  101  CO2 22  --   --  26  GLUCOSE 210*  --   --  69*  BUN 62*  --   --  59*  CREATININE 1.98*  --   --  1.60*  CALCIUM 8.4*  --   --  8.0*  MG  --  2.8*  --   --   PHOS  --  5.0*  --    --    Liver Function Tests: Recent Labs  Lab 10/06/17 2016  AST 51*  ALT 25  ALKPHOS 91  BILITOT 0.5  PROT 7.0  ALBUMIN 3.5   CBC: Recent Labs  Lab 10/06/17 2016 10/07/17 0606  WBC 9.4 5.7  NEUTROABS 6.3 4.5  HGB 6.3* 6.5*  HCT 20.4* 19.7*  MCV 85.6 82.5  PLT 333 224   Cardiac Enzymes: Recent Labs  Lab 10/06/17 2016 10/07/17 0127 10/07/17 0806 10/07/17 1436  TROPONINI <0.03 <0.03 <0.03 <0.03    CBG: Recent Labs  Lab 10/07/17 0104 10/07/17 0808 10/07/17 0812 10/07/17 0931 10/07/17 1140  GLUCAP 150* 62* 63* 116* 81    Recent Results (from the past 240 hour(s))  Blood culture (routine x 2)     Status: None (Preliminary result)   Collection Time: 10/06/17 10:43 PM  Result Value Ref Range Status   Specimen Description BLOOD LEFT HAND   Final   Special Requests   Final    BOTTLES DRAWN AEROBIC AND ANAEROBIC Blood Culture adequate volume   Culture   Final    NO GROWTH < 12 HOURS  Performed at [facility], [address] [place], [place] [address]    Report Status PENDING  Incomplete  Blood culture (routine x 2)     Status: None (Preliminary result)   Collection Time: 10/06/17 10:44 PM  Result Value Ref Range Status   Specimen Description BLOOD BLOOD LEFT FOREARM  Final   Special Requests   Final    BOTTLES DRAWN AEROBIC AND ANAEROBIC Blood Culture adequate volume   Culture   Final    NO GROWTH < 12 HOURS Performed at [facility], [address], [place], [place] [address]    Report Status PENDING  Incomplete  MRSA PCR Screening     Status: None   Collection Time: 10/07/17  1:07 AM  Result Value Ref Range Status   MRSA by PCR NEGATIVE NEGATIVE Final    Comment:        The GeneXpert MRSA Assay (FDA approved for NASAL specimens only), is one component of a comprehensive MRSA colonization surveillance program. It is not intended to diagnose MRSA infection nor to guide or monitor treatment for MRSA infections. Performed at  [facility], [address] [place], [place] [address]   Aerobic/Anaerobic Culture (surgical/deep wound)     Status: None (Preliminary result)   Collection Time: 10/07/17  2:24 AM  Result Value Ref Range Status   Specimen Description TOE LEFT  Final   Special Requests PENDING  Incomplete   Gram Stain   Final    RARE SQUAMOUS EPITHELIAL CELLS PRESENT NO WBC SEEN MODERATE GRAM POSITIVE RODS FEW GRAM POSITIVE COCCI RARE GRAM NEGATIVE RODS Performed at [facility], [address] [place], [place] [address]    Culture PENDING  Incomplete   Report Status PENDING  Incomplete  Aerobic/Anaerobic Culture (surgical/deep wound)     Status: None (Preliminary result)   Collection Time: 10/07/17  2:24 AM  Result Value Ref Range Status   Specimen Description   Final    LEG RIGHT Performed at [facility], [place] [address] [place], [place] [address]    Special Requests   Final    NONE Performed at [facility], [place], [place] [address]    Gram Stain   Final    NO WBC SEEN MODERATE GRAM NEGATIVE RODS FEW GRAM POSITIVE RODS RARE GRAM POSITIVE COCCI Performed at [facility], [place] [address] [place],  [address]    Culture PENDING  Incomplete   Report Status PENDING  Incomplete     Studies: Dg Chest 1 View  Result Date: 10/07/2017 CLINICAL DATA:  Feeding tube, evaluate placement of endotracheal tube EXAM: CHEST  1 VIEW COMPARISON:  Portable chest x-ray of 10/06/2016 FINDINGS: The tip of the endotracheal tube is approximately 3.9 cm above the carina. The lungs remain poorly aerated and cardiomegaly is stable. Haziness at both lung bases may reflect atelectasis and effusion although pneumonia cannot be excluded. NG tube extends below the hemidiaphragm. IMPRESSION: 1. Endotracheal tube tip 3.9 cm above the carina. 2. Poor aeration with basilar volume loss, probable effusions, and pneumonia cannot be excluded. 3. Stable cardiomegaly.  4. NG tube extends below the hemidiaphragm. Electronically Signed   By: Ivar Drape M.D.   On: 10/07/2017 14:03   Dg Abd 1 View  Result Date: 10/07/2017 CLINICAL DATA:  Evaluate position of feeding tube EXAM: ABDOMEN - 1 VIEW COMPARISON:  Abdomen 10/07/2016 FINDINGS: A tip of the feeding tube is seen within the fundus dex  proximal body of the stomach. Bowel gas pattern is nonspecific. Some volume loss is again noted at the lung bases and cardiomegaly is stable IMPRESSION: Feeding tube tip extends to the region of the fundus-proximal stomach. The bowel gas pattern is nonspecific Electronically Signed   By: Ivar Drape M.D.   On: 10/07/2017 14:04   Dg Abd 1 View  Result Date: 10/07/2017 CLINICAL DATA:  84 year old male with nasogastric tube placement. Subsequent encounter. EXAM: ABDOMEN - 1 VIEW COMPARISON:  10/07/2017 2:36 a.m. FINDINGS: Nasogastric tube radiopaque marker can only be well delineated to the level of the distal esophagus. Tip of the nasogastric tube may enter the stomach but is not adequately assessed secondary to overlying leads in the left upper quadrant. Removal of leads or change of lead position and repeat exam may prove helpful for further delineation. Nonspecific bowel gas pattern. IMPRESSION: Nasogastric tube radiopaque marker can only be well delineated to the level of the distal esophagus. Tip of the nasogastric tube may enter the stomach but is not adequately assessed secondary to overlying leads in the left upper quadrant. Removal of leads or change of lead position and repeat exam may prove helpful for further delineation. These results will be called to the ordering clinician or representative by the Radiologist Assistant, and communication documented in the PACS or zVision Dashboard. Electronically Signed   By: Genia Del M.D.   On: 10/07/2017 08:32   Dg Abd 1 View  Result Date: 10/07/2017 CLINICAL DATA:  OG tube placement, second attempt EXAM: ABDOMEN - 1 VIEW COMPARISON:   10/07/2017 at 0228 hours FINDINGS: Enteric tube terminates in the gastric cardia with its side port at/just above the GE junction. IMPRESSION: Enteric tube terminates in the gastric cardia with its side port at/just above the GE junction. Electronically Signed   By: Julian Hy M.D.   On: 10/07/2017 02:48   Dg Abd 1 View  Result Date: 10/07/2017 CLINICAL DATA:  OG tube placement EXAM: ABDOMEN - 1 VIEW COMPARISON:  10/06/2017 FINDINGS: Enteric tube terminates at the GE junction with the side port in the distal esophagus. IMPRESSION: Enteric tube terminates at the GE junction with the side port in the distal esophagus. Electronically Signed   By: Julian Hy M.D.   On: 10/07/2017 02:47   Dg Abd 1 View  Result Date: 10/06/2017 CLINICAL DATA:  OG tube EXAM: ABDOMEN - 1 VIEW COMPARISON:  None. FINDINGS: By history patient has an OG tube. Just to the RIGHT of the thoracic spine, there is a radiopaque line, possibly an orogastric tube. However this is slightly to the RIGHT of the expected course of the esophagus and does not extend into the LEFT UPPER QUADRANT. It is possible that orogastric tube has been withdrawn or removed and not included on the film. There is dense opacification of the LEFT lung base. Visualized bowel gas pattern is nonobstructive. IMPRESSION: Possible orogastric tube to the distal esophagus, but this appears slightly to the RIGHT of the expected course of the esophagus. Recommend confirmation of orogastric tube placement and consider advancing tube further into the stomach if this is felt to be the orogastric tube. Significant LEFT LOWER lobe opacity. Electronically Signed   By: Nolon Nations M.D.   On: 10/06/2017 21:00   Ct Head Wo Contrast  Result Date: 10/06/2017 CLINICAL DATA:  Altered mental status (AMS), unclear cause; C-spine trauma, high clinical risk (NEXUS/CCR). Collapsed in the bathroom. EXAM: CT HEAD WITHOUT CONTRAST CT CERVICAL SPINE WITHOUT CONTRAST  TECHNIQUE: Multidetector  CT imaging of the head and cervical spine was performed following the standard protocol without intravenous contrast. Multiplanar CT image reconstructions of the cervical spine were also generated. COMPARISON:  Head CT 06/17/2017 FINDINGS: CT HEAD FINDINGS Brain: Unchanged degree of atrophy and chronic small vessel ischemia. Encephalomalacia in the left parietal lobe again seen. No intracranial hemorrhage, mass effect, or midline shift. No hydrocephalus. The basilar cisterns are patent. No evidence of territorial infarct or acute ischemia. No extra-axial or intracranial fluid collection. Vascular: Atherosclerosis of skullbase vasculature without hyperdense vessel or abnormal calcification. Skull: Prior left frontoparietal craniotomy.  No skull fracture. Sinuses/Orbits: Paranasal sinuses and mastoid air cells are clear. The visualized orbits are unremarkable. Bilateral cataract resection. Other: None. CT CERVICAL SPINE FINDINGS Alignment: Straightening of normal cervical lordosis. Skull base and vertebrae: No acute fracture. Bulky anterior osteophytes from C3-C4 through C6-C7, well corticated lucency through the osteophytes at C4 and C5 may represent remote prior trauma. The dens and skull base are intact. Soft tissues and spinal canal: Debris in the hypopharynx limits assessment for prevertebral edema, no gross prevertebral soft tissue thickening. No evidence of canal hematoma. Disc levels: Near complete disc space loss at C2-C3 with bony ankylosis of the posterior elements on the left. Disc space narrowing at C5-C6 and C6-C7. 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 Bulky anterior osteophytes throughout, some of which are fragmented. Multilevel facet arthropathy. Upper chest: Moderate right pleural effusion, partially included. Other: Soft tissue prominence involving the right aspect of the hypopharynx is incompletely included in the field of view, causing mild leftward  endotracheal and enteric tube deviation. There are dense vascular calcifications. IMPRESSION: 1.  No acute intracranial abnormality.  No skull fracture. 2. Unchanged atrophy, chronic small vessel ischemia, and left parietal encephalomalacia. 3. Advanced multilevel degenerative change in the cervical spine without evidence of acute fracture. 4. Soft tissue prominence involving the right hypopharynx is only partially included in the field of view, this may represent retained secretions or asymmetric positioning of the patient's tongue, however underlying mass lesion is not excluded. Consider contrast enhanced CT of the neck as clinically indicated. 5. Moderate right pleural effusion partially included in the field of view. Electronically Signed   By: Keith Rake M.D.   On: 10/06/2017 21:38   Ct Cervical Spine Wo Contrast  Result Date: 10/06/2017 CLINICAL DATA:  Altered mental status (AMS), unclear cause; C-spine trauma, high clinical risk (NEXUS/CCR). Collapsed in the bathroom. EXAM: CT HEAD WITHOUT CONTRAST CT CERVICAL SPINE WITHOUT CONTRAST TECHNIQUE: Multidetector CT imaging of the head and cervical spine was performed following the standard protocol without intravenous contrast. Multiplanar CT image reconstructions of the cervical spine were also generated. COMPARISON:  Head CT 06/17/2017 FINDINGS: CT HEAD FINDINGS Brain: Unchanged degree of atrophy and chronic small vessel ischemia. Encephalomalacia in the left parietal lobe again seen. No intracranial hemorrhage, mass effect, or midline shift. No hydrocephalus. The basilar cisterns are patent. No evidence of territorial infarct or acute ischemia. No extra-axial or intracranial fluid collection. Vascular: Atherosclerosis of skullbase vasculature without hyperdense vessel or abnormal calcification. Skull: Prior left frontoparietal craniotomy.  No skull fracture. Sinuses/Orbits: Paranasal sinuses and mastoid air cells are clear. The visualized orbits are  unremarkable. Bilateral cataract resection. Other: None. CT CERVICAL SPINE FINDINGS Alignment: Straightening of normal cervical lordosis. Skull base and vertebrae: No acute fracture. Bulky anterior osteophytes from C3-C4 through C6-C7, well corticated lucency through the osteophytes at C4 and C5 may represent remote prior trauma. The dens and skull base are  intact. Soft tissues and spinal canal: Debris in the hypopharynx limits assessment for prevertebral edema, no gross prevertebral soft tissue thickening. No evidence of canal hematoma. Disc levels: Near complete disc space loss at C2-C3 with bony ankylosis of the posterior elements on the left. Disc space narrowing at C5-C6 and C6-C7. 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 Bulky anterior osteophytes throughout, some of which are fragmented. Multilevel facet arthropathy. Upper chest: Moderate right pleural effusion, partially included. Other: Soft tissue prominence involving the right aspect of the hypopharynx is incompletely included in the field of view, causing mild leftward endotracheal and enteric tube deviation. There are dense vascular calcifications. IMPRESSION: 1.  No acute intracranial abnormality.  No skull fracture. 2. Unchanged atrophy, chronic small vessel ischemia, and left parietal encephalomalacia. 3. Advanced multilevel degenerative change in the cervical spine without evidence of acute fracture. 4. Soft tissue prominence involving the right hypopharynx is only partially included in the field of view, this may represent retained secretions or asymmetric positioning of the patient's tongue, however underlying mass lesion is not excluded. Consider contrast enhanced CT of the neck as clinically indicated. 5. Moderate right pleural effusion partially included in the field of view. Electronically Signed   By: Keith Rake M.D.   On: 10/06/2017 21:38   Dg Chest Portable 1 View  Result Date: 10/06/2017 CLINICAL DATA:  Ett  tube EXAM: PORTABLE CHEST 1 VIEW COMPARISON:  06/20/2017 FINDINGS: Interval placement of endotracheal tube, tip approximately 3.0 centimeters above the carina. An orogastric tube is in place, tip overlying the level of the LOWER esophagus but difficult to confirm. The heart is enlarged. There is dense opacity in the LEFT lung base. Perihilar opacities are consistent with pulmonary edema. IMPRESSION: Interval placement of endotracheal tube and orogastric tube. LEFT LOWER lobe opacity and pulmonary edema. Electronically Signed   By: Nolon Nations M.D.   On: 10/06/2017 21:02   Dg Abd Portable 1v  Result Date: 10/07/2017 CLINICAL DATA:  Orogastric tube placement. EXAM: PORTABLE ABDOMEN - 1 VIEW COMPARISON:  Radiograph of same day. FINDINGS: The bowel gas pattern is normal. Distal tip of nasogastric tube is seen in proximal stomach, with side hole at expected position of gastroesophageal junction. No radio-opaque calculi or other significant radiographic abnormality are seen. IMPRESSION: Distal tip of nasogastric tube seen in proximal stomach, with side hole at expected position of gastroesophageal junction. Advancement is recommended. Electronically Signed   By: Marijo Conception, M.D.   On: 10/07/2017 12:18    Scheduled Meds: . chlorhexidine gluconate (MEDLINE KIT)  15 mL Mouth Rinse BID  . feeding supplement (PRO-STAT SUGAR FREE 64)  60 mL Per Tube QID  . feeding supplement (VITAL HIGH PROTEIN)  1,000 mL Per Tube Q24H  . heparin  5,000 Units Subcutaneous Q8H  . [START ON 10/08/2017] Influenza vac split quadrivalent PF  0.5 mL Intramuscular Tomorrow-1000  . insulin aspart  2-6 Units Subcutaneous Q4H  . mouth rinse  15 mL Mouth Rinse 10 times per day  . multivitamin  15 mL Per Tube Daily  . polyethylene glycol  17 g Per Tube Daily  . senna-docusate  2 tablet Per Tube BID   Continuous Infusions: . sodium chloride 50 mL/hr (10/07/17 1442)  . ceFEPime (MAXIPIME) IV Stopped (10/07/17 0604)  .  famotidine (PEPCID) IV Stopped (10/07/17 1155)  . fentaNYL infusion INTRAVENOUS 100 mcg/hr (10/07/17 1200)  . norepinephrine (LEVOPHED) Adult infusion Stopped (10/07/17 0820)  . propofol (DIPRIVAN) infusion 10 mcg/kg/min (10/07/17 1200)  .  valproate sodium 52.5 mL/hr at 10/07/17 1200    Assessment/Plan:  1. Cardiac arrest.  Patient on hypothermia protocol as per critical care team.  Continue supportive care.  Watch closely for anoxic brain injury.  Placed on empiric Depacon. 2. Acute hypoxic respiratory failure.  Continue ventilator support.  When I saw him he was on 40% FiO2. 3. Left lower lobe pneumonia, right lower extremity ulceration with foul-smelling discharge, left first toe ulceration and swelling of the first toe likely chronic ulceration on aggressive antibiotics. 4. Hypotension.  Patient off pressors when I saw him 5. Acute kidney injury and hyperkalemia.  Creatinine has improved down to 1.6 and potassium has improved down to 4.4. 6. History of chronic diastolic congestive heart failure 7. Overall prognosis poor and high risk for cardiopulmonary arrest.  Patient listed as full code.  Code Status:     Code Status Orders  (From admission, onward)         Start     Ordered   10/07/17 0132  Full code  Continuous     10/07/17 0131        Code Status History    Date Active Date Inactive Code Status Order ID Comments User Context   10/07/2017 0031 10/07/2017 0131 Full Code [id number]  Awilda Bill, NP ED   06/17/2017 1856 06/23/2017 2244 Full Code [id number]  Nicholes Mango, MD Inpatient   02/18/2017 1623 02/18/2017 2133 Full Code [id number]  Delana Meyer, Dolores Lory, MD Inpatient   02/11/2017 1138 02/11/2017 1736 Full Code [id number]  Schnier, Dolores Lory, MD Inpatient   10/24/2014 1934 10/31/2014 1837 Full Code [id number]  Fritzi Mandes, MD Inpatient   10/11/2014 2347 10/17/2014 1844 Full Code [id number]  Lance Coon, MD Inpatient     Family Communication: As per critical care  team Disposition Plan: To be determined  Consultants:  Critical care specialist  Antibiotics:  Cefepime  Time spent: 35 minutes, case discussed with critical care specialist and nursing staff  [employer]

## 2017-10-07 NOTE — Consult Note (Signed)
Billy Liu is a 84 y.o. male  [id number]  Primary Cardiologist: Neoma Laming Reason for Consultation: Cardiac arrest  HPI: This is a 84 year old African-American male with a history of multiple medical problems including CHF hypertension COPD presented to the hospital after cardiac arrest.  Patient is unable to give any history because patient is intubated.  Review of Systems: Patient is unable to give any history   Past Medical History:  Diagnosis Date  . Anxiety   . Cellulitis 10/17/2014   left lower leg  . Chronic diastolic CHF (congestive heart failure) ([place])   . CKD (chronic kidney disease), stage III ([place])   . COPD (chronic obstructive pulmonary disease) ([place])   . Coronary artery disease   . Dementia ([place])    Alzheimer's   . Depression   . Diabetes mellitus without complication ([place])   . GERD (gastroesophageal reflux disease)   . Hyperlipemia   . Hypertension   . Obesity   . Renal disorder     Medications Prior to Admission  Medication Sig Dispense Refill  . acetaminophen (TYLENOL) 500 MG tablet Take 500 mg by mouth 3 (three) times daily.    [name] amLODipine (NORVASC) 10 MG tablet Take 10 mg by mouth daily.    [name] aspirin EC 81 MG tablet Take 81 mg by mouth daily.    . Cholecalciferol (VITAMIN D) 2000 units tablet Take 2,000 Units by mouth daily.    . cloNIDine (CATAPRES) 0.2 MG tablet Take 1 tablet (0.2 mg total) by mouth 3 (three) times daily. 60 tablet 11  . divalproex (DEPAKOTE) 250 MG DR tablet Take 250 mg by mouth daily.     . divalproex (DEPAKOTE) 500 MG DR tablet Take 500 mg by mouth at bedtime.    . ferrous sulfate 324 (65 Fe) MG TBEC Take 324 mg by mouth daily.    . finasteride (PROSCAR) 5 MG tablet Take 1 tablet (5 mg total) by mouth daily.    [name] glucagon, human recombinant, (GLUCAGEN DIAGNOSTIC) 1 MG injection Inject 1 mg into the muscle once as needed for low blood sugar.    . hydrALAZINE (APRESOLINE) 100 MG tablet Take 100 mg by mouth 3 (three) times  daily.    . insulin detemir (LEVEMIR) 100 UNIT/ML injection Inject 40 Units into the skin 2 (two) times daily.     . metoprolol succinate (TOPROL-XL) 100 MG 24 hr tablet Take 300 mg by mouth daily.     . pantoprazole (PROTONIX) 40 MG tablet Take 1 tablet (40 mg total) by mouth daily. 30 tablet 0  . polyethylene glycol (MIRALAX / GLYCOLAX) packet Take 17 g by mouth daily.     [name] senna (SENOKOT) 8.6 MG TABS tablet Take 2 tablets by mouth at bedtime.    . sertraline (ZOLOFT) 25 MG tablet Take 75 mg by mouth daily.     . simvastatin (ZOCOR) 20 MG tablet Take 20 mg by mouth at bedtime.     . Sunscreens (AVEENO DAILY MOISTURIZER) LOTN Apply topically at bedtime. Apply to bilateral feet and legs    . torsemide (DEMADEX) 20 MG tablet Take 2 tablets (40 mg total) by mouth daily.    . vitamin C (ASCORBIC ACID) 500 MG tablet Take 500 mg by mouth daily.    [name] omeprazole (PRILOSEC) 40 MG capsule Take 1 capsule (40 mg total) by mouth 2 (two) times daily for 14 days. 28 capsule 0     . chlorhexidine gluconate (MEDLINE KIT)  15 mL Mouth  Rinse BID  . feeding supplement (PRO-STAT SUGAR FREE 64)  60 mL Per Tube QID  . feeding supplement (VITAL HIGH PROTEIN)  1,000 mL Per Tube Q24H  . heparin  5,000 Units Subcutaneous Q8H  . [START ON 10/08/2017] Influenza vac split quadrivalent PF  0.5 mL Intramuscular Tomorrow-1000  . insulin aspart  2-6 Units Subcutaneous Q4H  . mouth rinse  15 mL Mouth Rinse 10 times per day  . multivitamin  15 mL Per Tube Daily  . polyethylene glycol  17 g Per Tube Daily  . senna-docusate  2 tablet Per Tube BID  . vecuronium        Infusions: . sodium chloride 50 mL/hr at 10/07/17 1200  . ceFEPime (MAXIPIME) IV Stopped (10/07/17 0604)  . famotidine (PEPCID) IV Stopped (10/07/17 1155)  . fentaNYL infusion INTRAVENOUS 100 mcg/hr (10/07/17 1200)  . norepinephrine (LEVOPHED) Adult infusion Stopped (10/07/17 0820)  . propofol (DIPRIVAN) infusion 10 mcg/kg/min (10/07/17 1200)  . valproate  sodium 52.5 mL/hr at 10/07/17 1200    Allergies  Allergen Reactions  . Penicillins Other (See Comments)    Per MAR Has patient had a PCN reaction causing immediate rash, facial/tongue/throat swelling, SOB or lightheadedness with hypotension: Unknown Has patient had a PCN reaction causing severe rash involving mucus membranes or skin necrosis: Unknown Has patient had a PCN reaction that required hospitalization: Unknown Has patient had a PCN reaction occurring within the last 10 years: Unknown If all of the above answers are "NO", then may proceed with Cephalosporin use.     Social History   Socioeconomic History  . Marital status: Married    Spouse name: Not on file  . Number of children: Not on file  . Years of education: Not on file  . Highest education level: Not on file  Occupational History  . Not on file  Social Needs  . Financial resource strain: Not on file  . Food insecurity:    Worry: Not on file    Inability: Not on file  . Transportation needs:    Medical: Not on file    Non-medical: Not on file  Tobacco Use  . Smoking status: Never Smoker  . Smokeless tobacco: Former Systems developer    Types: Chew  Substance and Sexual Activity  . Alcohol use: No  . Drug use: No  . Sexual activity: Not on file  Lifestyle  . Physical activity:    Days per week: Not on file    Minutes per session: Not on file  . Stress: Not on file  Relationships  . Social connections:    Talks on phone: Not on file    Gets together: Not on file    Attends religious service: Not on file    Active member of club or organization: Not on file    Attends meetings of clubs or organizations: Not on file    Relationship status: Not on file  . Intimate partner violence:    Fear of current or ex partner: Not on file    Emotionally abused: Not on file    Physically abused: Not on file    Forced sexual activity: Not on file  Other Topics Concern  . Not on file  Social History Narrative  . Not on  file    Family History  Problem Relation Age of Onset  . Cancer Brother     PHYSICAL EXAM: Vitals:   10/07/17 1200 10/07/17 1230  BP: (!) 143/61 (!) 142/59  Pulse: (!) 51 [name])  52  Resp: 16 16  Temp: (!) 96.8 F (36 C)   SpO2: 100% 100%     Intake/Output Summary (Last 24 hours) at 10/07/2017 1410 Last data filed at 10/07/2017 1200 Gross per 24 hour  Intake 2619.11 ml  Output 1400 ml  Net 1219.11 ml    General:  Well appearing. No respiratory difficulty HEENT: normal Neck: supple. no JVD. Carotids 2+ bilat; no bruits. No lymphadenopathy or thryomegaly appreciated. Cor: PMI nondisplaced. Regular rate & rhythm. No rubs, gallops or murmurs. Lungs: clear Abdomen: soft, nontender, nondistended. No hepatosplenomegaly. No bruits or masses. Good bowel sounds. Extremities: no cyanosis, clubbing, rash, edema Neuro: alert & oriented x 3, cranial nerves grossly intact. moves all 4 extremities w/o difficulty. Affect pleasant.  ECG: Sinus bradycardia with nonspecific ST-T changes  Results for orders placed or performed during the hospital encounter of 10/06/17 (from the past 24 hour(s))  CBC with Differential/Platelet     Status: Abnormal   Collection Time: 10/06/17  8:16 PM  Result Value Ref Range   WBC 9.4 4.0 - 10.5 K/uL   RBC 2.39 (L) 4.40 - 5.90 MIL/uL   Hemoglobin 6.3 (L) 13.0 - 18.0 g/dL   HCT 20.4 (L) 40.0 - 52.0 %   MCV 85.6 80.0 - 100.0 fL   MCH 26.3 26.0 - 34.0 pg   MCHC 30.7 (L) 32.0 - 36.0 g/dL   RDW 18.6 (H) 11.5 - 14.5 %   Platelets 333 150 - 440 K/uL   Neutrophils Relative % 65 %   Lymphocytes Relative 27 %   Monocytes Relative 5 %   Eosinophils Relative 1 %   Basophils Relative 0 %   Band Neutrophils 0 %   Metamyelocytes Relative 1 %   Myelocytes 1 %   Promyelocytes Relative 0 %   Blasts 0 %   nRBC 3 (H) 0 /100 WBC   Other 0 %   Neutro Abs 6.3 1.4 - 6.5 K/uL   Lymphs Abs 2.5 1.0 - 3.6 K/uL   Monocytes Absolute 0.5 0.2 - 1.0 K/uL   Eosinophils Absolute  0.1 0 - 0.7 K/uL   Basophils Absolute 0.0 0 - 0.1 K/uL   RBC Morphology POLYCHROMASIA PRESENT   Comprehensive metabolic panel     Status: Abnormal   Collection Time: 10/06/17  8:16 PM  Result Value Ref Range   Sodium 132 (L) 135 - 145 mmol/L   Potassium 6.3 (HH) 3.5 - 5.1 mmol/L   Chloride 98 98 - 111 mmol/L   CO2 22 22 - 32 mmol/L   Glucose, Bld 210 (H) 70 - 99 mg/dL   BUN 62 (H) 8 - 23 mg/dL   Creatinine, Ser 1.98 (H) 0.61 - 1.24 mg/dL   Calcium 8.4 (L) 8.9 - 10.3 mg/dL   Total Protein 7.0 6.5 - 8.1 g/dL   Albumin 3.5 3.5 - 5.0 g/dL   AST 51 (H) 15 - 41 U/L   ALT 25 0 - 44 U/L   Alkaline Phosphatase 91 38 - 126 U/L   Total Bilirubin 0.5 0.3 - 1.2 mg/dL   GFR calc non Af Amer 30 (L) >60 mL/min   GFR calc Af Amer 34 (L) >60 mL/min   Anion gap 12 5 - 15  Troponin I     Status: None   Collection Time: 10/06/17  8:16 PM  Result Value Ref Range   Troponin I <0.03 <0.03 ng/mL  Protime-INR     Status: Abnormal   Collection Time: 10/06/17  8:16 PM  Result Value Ref Range   Prothrombin Time 17.7 (H) 11.4 - 15.2 seconds   INR 1.47   APTT     Status: Abnormal   Collection Time: 10/06/17  8:16 PM  Result Value Ref Range   aPTT 37 (H) 24 - 36 seconds  Blood gas, arterial     Status: Abnormal (Preliminary result)   Collection Time: 10/06/17  9:24 PM  Result Value Ref Range   FIO2 0.50    Delivery systems VENTILATOR    Mode ASSIST CONTROL    VT 500 mL   LHR 16 resp/min   Peep/cpap 5.0 cm H20   pH, Arterial 7.25 (L) 7.350 - 7.450   pCO2 arterial 48 32.0 - 48.0 mmHg   pO2, Arterial 94 83.0 - 108.0 mmHg   Bicarbonate 21.0 20.0 - 28.0 mmol/L   Acid-base deficit 6.4 (H) 0.0 - 2.0 mmol/L   O2 Saturation 95.9 %   Patient temperature 37.0    Collection site PENDING    Sample type ARTERIAL DRAW    Allens test (pass/fail) PASS PASS  Blood culture (routine x 2)     Status: None (Preliminary result)   Collection Time: 10/06/17 10:43 PM  Result Value Ref Range   Specimen Description  BLOOD LEFT HAND     Special Requests      BOTTLES DRAWN AEROBIC AND ANAEROBIC Blood Culture adequate volume   Culture      NO GROWTH < 12 HOURS Performed at [facility], [address] [place], [place] [address]    Report Status PENDING   Blood culture (routine x 2)     Status: None (Preliminary result)   Collection Time: 10/06/17 10:44 PM  Result Value Ref Range   Specimen Description BLOOD BLOOD LEFT FOREARM    Special Requests      BOTTLES DRAWN AEROBIC AND ANAEROBIC Blood Culture adequate volume   Culture      NO GROWTH < 12 HOURS Performed at [facility], [address] [place], [place] [address]    Report Status PENDING   Type and screen     Status: None (Preliminary result)   Collection Time: 10/06/17 10:44 PM  Result Value Ref Range   ABO/RH(D) AB POS    Antibody Screen NEG    Sample Expiration 10/09/2017    Unit Number [id number]    Blood Component Type RBC LR PHER1    Unit division 00    Status of Unit ISSUED    Transfusion Status OK TO TRANSFUSE    Crossmatch Result      Compatible Performed at [facility], [address], [place], [place] [address]    Unit Number [id number]    Blood Component Type RBC LR PHER2    Unit division 00    Status of Unit ISSUED    Transfusion Status OK TO TRANSFUSE    Crossmatch Result Compatible   Prepare RBC     Status: None   Collection Time: 10/06/17 10:45 PM  Result Value Ref Range   Order Confirmation      ORDER PROCESSED BY BLOOD BANK Performed at [facility], [address] [place], [place] [address]   Arterial Blood Gas     Status: Abnormal   Collection Time: 10/07/17 12:23 AM  Result Value Ref Range   FIO2 0.50    Delivery systems VENTILATOR    Mode PRESSURE REGULATED VOLUME CONTROL    VT 500 mL   LHR 16 resp/min   Peep/cpap  5.0 cm H20   pH, Arterial 7.46 (H) 7.350 - 7.450   pCO2 arterial 47 32.0 - 48.0 mmHg   pO2, Arterial 122 (H) 83.0 - 108.0 mmHg    Bicarbonate 29.1 (H) 20.0 - 28.0 mmol/L   Acid-Base Excess 3.5 (H) 0.0 - 2.0 mmol/L   O2 Saturation 99.2 %   Patient temperature 33.1    Collection site RIGHT RADIAL    Sample type ARTERIAL DRAW    Allens test (pass/fail) PASS PASS  Glucose, capillary     Status: Abnormal   Collection Time: 10/07/17  1:04 AM  Result Value Ref Range   Glucose-Capillary 150 (H) 70 - 99 mg/dL  MRSA PCR Screening     Status: None   Collection Time: 10/07/17  1:07 AM  Result Value Ref Range   MRSA by PCR NEGATIVE NEGATIVE  Troponin I     Status: None   Collection Time: 10/07/17  1:27 AM  Result Value Ref Range   Troponin I <0.03 <0.03 ng/mL  Protime-INR now     Status: Abnormal   Collection Time: 10/07/17  1:27 AM  Result Value Ref Range   Prothrombin Time 16.6 (H) 11.4 - 15.2 seconds   INR 1.35   APTT     Status: None   Collection Time: 10/07/17  1:27 AM  Result Value Ref Range   aPTT 35 24 - 36 seconds  Magnesium     Status: Abnormal   Collection Time: 10/07/17  1:27 AM  Result Value Ref Range   Magnesium 2.8 (H) 1.7 - 2.4 mg/dL  Phosphorus     Status: Abnormal   Collection Time: 10/07/17  1:27 AM  Result Value Ref Range   Phosphorus 5.0 (H) 2.5 - 4.6 mg/dL  Salicylate level     Status: None   Collection Time: 10/07/17  1:27 AM  Result Value Ref Range   Salicylate Lvl <3.7 2.8 - 30.0 mg/dL  Acetaminophen level     Status: Abnormal   Collection Time: 10/07/17  1:27 AM  Result Value Ref Range   Acetaminophen (Tylenol), Serum <10 (L) 10 - 30 ug/mL  Ethanol     Status: None   Collection Time: 10/07/17  1:27 AM  Result Value Ref Range   Alcohol, Ethyl (B) <10 <10 mg/dL  Procalcitonin - Baseline     Status: None   Collection Time: 10/07/17  1:27 AM  Result Value Ref Range   Procalcitonin <0.10 ng/mL  Urinalysis, Routine w reflex microscopic     Status: Abnormal   Collection Time: 10/07/17  2:24 AM  Result Value Ref Range   Color, Urine STRAW (A) YELLOW   APPearance CLEAR (A) CLEAR    Specific Gravity, Urine 1.008 1.005 - 1.030   pH 5.0 5.0 - 8.0   Glucose, UA NEGATIVE NEGATIVE mg/dL   Hgb urine dipstick NEGATIVE NEGATIVE   Bilirubin Urine NEGATIVE NEGATIVE   Ketones, ur NEGATIVE NEGATIVE mg/dL   Protein, ur NEGATIVE NEGATIVE mg/dL   Nitrite NEGATIVE NEGATIVE   Leukocytes, UA NEGATIVE NEGATIVE  Urine Drug Screen, Qualitative ([facility] only)     Status: None   Collection Time: 10/07/17  2:24 AM  Result Value Ref Range   Tricyclic, Ur Screen NONE DETECTED NONE DETECTED   Amphetamines, Ur Screen NONE DETECTED NONE DETECTED   MDMA (Ecstasy)Ur Screen NONE DETECTED NONE DETECTED   Cocaine Metabolite,Ur [place] NONE DETECTED NONE DETECTED   Opiate, Ur Screen NONE DETECTED NONE DETECTED   Phencyclidine (PCP) Ur S NONE DETECTED  NONE DETECTED   Cannabinoid 50 Ng, Ur [place] NONE DETECTED NONE DETECTED   Barbiturates, Ur Screen NONE DETECTED NONE DETECTED   Benzodiazepine, Ur Scrn NONE DETECTED NONE DETECTED   Methadone Scn, Ur NONE DETECTED NONE DETECTED  Aerobic/Anaerobic Culture (surgical/deep wound)     Status: None (Preliminary result)   Collection Time: 10/07/17  2:24 AM  Result Value Ref Range   Specimen Description      TOE LEFT Performed at [facility], [place] [address] [place], [place] [address]    Special Requests PENDING    Gram Stain PENDING    Culture PENDING    Report Status PENDING   Aerobic/Anaerobic Culture (surgical/deep wound)     Status: None (Preliminary result)   Collection Time: 10/07/17  2:24 AM  Result Value Ref Range   Specimen Description      LEG RIGHT Performed at [facility], [place] [address] [place], [place] [address]    Special Requests      NONE Performed at [facility], [address] [place], [place] [address]    Gram Stain PENDING    Culture PENDING    Report Status PENDING   Potassium     Status: None   Collection Time: 10/07/17  4:09 AM  Result Value Ref Range   Potassium 4.7 3.5 - 5.1 mmol/L  CBC with  Differential/Platelet     Status: Abnormal   Collection Time: 10/07/17  6:06 AM  Result Value Ref Range   WBC 5.7 3.8 - 10.6 K/uL   RBC 2.38 (L) 4.40 - 5.90 MIL/uL   Hemoglobin 6.5 (L) 13.0 - 18.0 g/dL   HCT 19.7 (L) 40.0 - 52.0 %   MCV 82.5 80.0 - 100.0 fL   MCH 27.3 26.0 - 34.0 pg   MCHC 33.1 32.0 - 36.0 g/dL   RDW 18.3 (H) 11.5 - 14.5 %   Platelets 224 150 - 440 K/uL   Neutrophils Relative % 80 %   Lymphocytes Relative 15 %   Monocytes Relative 5 %   Eosinophils Relative 0 %   Basophils Relative 0 %   Band Neutrophils 0 %   Metamyelocytes Relative 0 %   Myelocytes 0 %   Promyelocytes Relative 0 %   Blasts 0 %   nRBC 1 (H) 0 /100 WBC   Other 0 %   Neutro Abs 4.5 1.4 - 6.5 K/uL   Lymphs Abs 0.9 (L) 1.0 - 3.6 K/uL   Monocytes Absolute 0.3 0.2 - 1.0 K/uL   Eosinophils Absolute 0.0 0 - 0.7 K/uL   Basophils Absolute 0.0 0 - 0.1 K/uL   RBC Morphology MIXED RBC POPULATION   Troponin I     Status: None   Collection Time: 10/07/17  8:06 AM  Result Value Ref Range   Troponin I <0.03 <0.03 ng/mL  Basic metabolic panel     Status: Abnormal   Collection Time: 10/07/17  8:06 AM  Result Value Ref Range   Sodium 136 135 - 145 mmol/L   Potassium 4.4 3.5 - 5.1 mmol/L   Chloride 101 98 - 111 mmol/L   CO2 26 22 - 32 mmol/L   Glucose, Bld 69 (L) 70 - 99 mg/dL   BUN 59 (H) 8 - 23 mg/dL   Creatinine, Ser 1.60 (H) 0.61 - 1.24 mg/dL   Calcium 8.0 (L) 8.9 - 10.3 mg/dL   GFR calc non Af Amer 38 (L) >60 mL/min   GFR calc Af Amer 44 (  L) >60 mL/min   Anion gap 9 5 - 15  Glucose, capillary     Status: Abnormal   Collection Time: 10/07/17  8:08 AM  Result Value Ref Range   Glucose-Capillary 62 (L) 70 - 99 mg/dL  Glucose, capillary     Status: Abnormal   Collection Time: 10/07/17  8:12 AM  Result Value Ref Range   Glucose-Capillary 63 (L) 70 - 99 mg/dL  Glucose, capillary     Status: Abnormal   Collection Time: 10/07/17  9:31 AM  Result Value Ref Range   Glucose-Capillary 116 (H) 70 - 99  mg/dL   Comment 1 Document in Chart   Glucose, capillary     Status: None   Collection Time: 10/07/17 11:40 AM  Result Value Ref Range   Glucose-Capillary 81 70 - 99 mg/dL   Comment 1 Document in Chart    Dg Chest 1 View  Result Date: 10/07/2017 CLINICAL DATA:  Feeding tube, evaluate placement of endotracheal tube EXAM: CHEST  1 VIEW COMPARISON:  Portable chest x-ray of 10/06/2016 FINDINGS: The tip of the endotracheal tube is approximately 3.9 cm above the carina. The lungs remain poorly aerated and cardiomegaly is stable. Haziness at both lung bases may reflect atelectasis and effusion although pneumonia cannot be excluded. NG tube extends below the hemidiaphragm. IMPRESSION: 1. Endotracheal tube tip 3.9 cm above the carina. 2. Poor aeration with basilar volume loss, probable effusions, and pneumonia cannot be excluded. 3. Stable cardiomegaly. 4. NG tube extends below the hemidiaphragm. Electronically Signed   By: Ivar Drape M.D.   On: 10/07/2017 14:03   Dg Abd 1 View  Result Date: 10/07/2017 CLINICAL DATA:  Evaluate position of feeding tube EXAM: ABDOMEN - 1 VIEW COMPARISON:  Abdomen 10/07/2016 FINDINGS: A tip of the feeding tube is seen within the fundus dex proximal body of the stomach. Bowel gas pattern is nonspecific. Some volume loss is again noted at the lung bases and cardiomegaly is stable IMPRESSION: Feeding tube tip extends to the region of the fundus-proximal stomach. The bowel gas pattern is nonspecific Electronically Signed   By: Ivar Drape M.D.   On: 10/07/2017 14:04   Dg Abd 1 View  Result Date: 10/07/2017 CLINICAL DATA:  84 year old male with nasogastric tube placement. Subsequent encounter. EXAM: ABDOMEN - 1 VIEW COMPARISON:  10/07/2017 2:36 a.m. FINDINGS: Nasogastric tube radiopaque marker can only be well delineated to the level of the distal esophagus. Tip of the nasogastric tube may enter the stomach but is not adequately assessed secondary to overlying leads in the left  upper quadrant. Removal of leads or change of lead position and repeat exam may prove helpful for further delineation. Nonspecific bowel gas pattern. IMPRESSION: Nasogastric tube radiopaque marker can only be well delineated to the level of the distal esophagus. Tip of the nasogastric tube may enter the stomach but is not adequately assessed secondary to overlying leads in the left upper quadrant. Removal of leads or change of lead position and repeat exam may prove helpful for further delineation. These results will be called to the ordering clinician or representative by the Radiologist Assistant, and communication documented in the PACS or zVision Dashboard. Electronically Signed   By: Genia Del M.D.   On: 10/07/2017 08:32   Dg Abd 1 View  Result Date: 10/07/2017 CLINICAL DATA:  OG tube placement, second attempt EXAM: ABDOMEN - 1 VIEW COMPARISON:  10/07/2017 at 0228 hours FINDINGS: Enteric tube terminates in the gastric cardia with its side port at/just  above the GE junction. IMPRESSION: Enteric tube terminates in the gastric cardia with its side port at/just above the GE junction. Electronically Signed   By: Julian Hy M.D.   On: 10/07/2017 02:48   Dg Abd 1 View  Result Date: 10/07/2017 CLINICAL DATA:  OG tube placement EXAM: ABDOMEN - 1 VIEW COMPARISON:  10/06/2017 FINDINGS: Enteric tube terminates at the GE junction with the side port in the distal esophagus. IMPRESSION: Enteric tube terminates at the GE junction with the side port in the distal esophagus. Electronically Signed   By: Julian Hy M.D.   On: 10/07/2017 02:47   Dg Abd 1 View  Result Date: 10/06/2017 CLINICAL DATA:  OG tube EXAM: ABDOMEN - 1 VIEW COMPARISON:  None. FINDINGS: By history patient has an OG tube. Just to the RIGHT of the thoracic spine, there is a radiopaque line, possibly an orogastric tube. However this is slightly to the RIGHT of the expected course of the esophagus and does not extend into the LEFT  UPPER QUADRANT. It is possible that orogastric tube has been withdrawn or removed and not included on the film. There is dense opacification of the LEFT lung base. Visualized bowel gas pattern is nonobstructive. IMPRESSION: Possible orogastric tube to the distal esophagus, but this appears slightly to the RIGHT of the expected course of the esophagus. Recommend confirmation of orogastric tube placement and consider advancing tube further into the stomach if this is felt to be the orogastric tube. Significant LEFT LOWER lobe opacity. Electronically Signed   By: Nolon Nations M.D.   On: 10/06/2017 21:00   Ct Head Wo Contrast  Result Date: 10/06/2017 CLINICAL DATA:  Altered mental status (AMS), unclear cause; C-spine trauma, high clinical risk (NEXUS/CCR). Collapsed in the bathroom. EXAM: CT HEAD WITHOUT CONTRAST CT CERVICAL SPINE WITHOUT CONTRAST TECHNIQUE: Multidetector CT imaging of the head and cervical spine was performed following the standard protocol without intravenous contrast. Multiplanar CT image reconstructions of the cervical spine were also generated. COMPARISON:  Head CT 06/17/2017 FINDINGS: CT HEAD FINDINGS Brain: Unchanged degree of atrophy and chronic small vessel ischemia. Encephalomalacia in the left parietal lobe again seen. No intracranial hemorrhage, mass effect, or midline shift. No hydrocephalus. The basilar cisterns are patent. No evidence of territorial infarct or acute ischemia. No extra-axial or intracranial fluid collection. Vascular: Atherosclerosis of skullbase vasculature without hyperdense vessel or abnormal calcification. Skull: Prior left frontoparietal craniotomy.  No skull fracture. Sinuses/Orbits: Paranasal sinuses and mastoid air cells are clear. The visualized orbits are unremarkable. Bilateral cataract resection. Other: None. CT CERVICAL SPINE FINDINGS Alignment: Straightening of normal cervical lordosis. Skull base and vertebrae: No acute fracture. Bulky anterior  osteophytes from C3-C4 through C6-C7, well corticated lucency through the osteophytes at C4 and C5 may represent remote prior trauma. The dens and skull base are intact. Soft tissues and spinal canal: Debris in the hypopharynx limits assessment for prevertebral edema, no gross prevertebral soft tissue thickening. No evidence of canal hematoma. Disc levels: Near complete disc space loss at C2-C3 with bony ankylosis of the posterior elements on the left. Disc space narrowing at C5-C6 and C6-C7. 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 Bulky anterior osteophytes throughout, some of which are fragmented. Multilevel facet arthropathy. Upper chest: Moderate right pleural effusion, partially included. Other: Soft tissue prominence involving the right aspect of the hypopharynx is incompletely included in the field of view, causing mild leftward endotracheal and enteric tube deviation. There are dense vascular calcifications. IMPRESSION: 1.  No acute  intracranial abnormality.  No skull fracture. 2. Unchanged atrophy, chronic small vessel ischemia, and left parietal encephalomalacia. 3. Advanced multilevel degenerative change in the cervical spine without evidence of acute fracture. 4. Soft tissue prominence involving the right hypopharynx is only partially included in the field of view, this may represent retained secretions or asymmetric positioning of the patient's tongue, however underlying mass lesion is not excluded. Consider contrast enhanced CT of the neck as clinically indicated. 5. Moderate right pleural effusion partially included in the field of view. Electronically Signed   By: Keith Rake M.D.   On: 10/06/2017 21:38   Ct Cervical Spine Wo Contrast  Result Date: 10/06/2017 CLINICAL DATA:  Altered mental status (AMS), unclear cause; C-spine trauma, high clinical risk (NEXUS/CCR). Collapsed in the bathroom. EXAM: CT HEAD WITHOUT CONTRAST CT CERVICAL SPINE WITHOUT CONTRAST TECHNIQUE:  Multidetector CT imaging of the head and cervical spine was performed following the standard protocol without intravenous contrast. Multiplanar CT image reconstructions of the cervical spine were also generated. COMPARISON:  Head CT 06/17/2017 FINDINGS: CT HEAD FINDINGS Brain: Unchanged degree of atrophy and chronic small vessel ischemia. Encephalomalacia in the left parietal lobe again seen. No intracranial hemorrhage, mass effect, or midline shift. No hydrocephalus. The basilar cisterns are patent. No evidence of territorial infarct or acute ischemia. No extra-axial or intracranial fluid collection. Vascular: Atherosclerosis of skullbase vasculature without hyperdense vessel or abnormal calcification. Skull: Prior left frontoparietal craniotomy.  No skull fracture. Sinuses/Orbits: Paranasal sinuses and mastoid air cells are clear. The visualized orbits are unremarkable. Bilateral cataract resection. Other: None. CT CERVICAL SPINE FINDINGS Alignment: Straightening of normal cervical lordosis. Skull base and vertebrae: No acute fracture. Bulky anterior osteophytes from C3-C4 through C6-C7, well corticated lucency through the osteophytes at C4 and C5 may represent remote prior trauma. The dens and skull base are intact. Soft tissues and spinal canal: Debris in the hypopharynx limits assessment for prevertebral edema, no gross prevertebral soft tissue thickening. No evidence of canal hematoma. Disc levels: Near complete disc space loss at C2-C3 with bony ankylosis of the posterior elements on the left. Disc space narrowing at C5-C6 and C6-C7. 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 Bulky anterior osteophytes throughout, some of which are fragmented. Multilevel facet arthropathy. Upper chest: Moderate right pleural effusion, partially included. Other: Soft tissue prominence involving the right aspect of the hypopharynx is incompletely included in the field of view, causing mild leftward  endotracheal and enteric tube deviation. There are dense vascular calcifications. IMPRESSION: 1.  No acute intracranial abnormality.  No skull fracture. 2. Unchanged atrophy, chronic small vessel ischemia, and left parietal encephalomalacia. 3. Advanced multilevel degenerative change in the cervical spine without evidence of acute fracture. 4. Soft tissue prominence involving the right hypopharynx is only partially included in the field of view, this may represent retained secretions or asymmetric positioning of the patient's tongue, however underlying mass lesion is not excluded. Consider contrast enhanced CT of the neck as clinically indicated. 5. Moderate right pleural effusion partially included in the field of view. Electronically Signed   By: Keith Rake M.D.   On: 10/06/2017 21:38   Dg Chest Portable 1 View  Result Date: 10/06/2017 CLINICAL DATA:  Ett tube EXAM: PORTABLE CHEST 1 VIEW COMPARISON:  06/20/2017 FINDINGS: Interval placement of endotracheal tube, tip approximately 3.0 centimeters above the carina. An orogastric tube is in place, tip overlying the level of the LOWER esophagus but difficult to confirm. The heart is enlarged. There is dense opacity in the  LEFT lung base. Perihilar opacities are consistent with pulmonary edema. IMPRESSION: Interval placement of endotracheal tube and orogastric tube. LEFT LOWER lobe opacity and pulmonary edema. Electronically Signed   By: Nolon Nations M.D.   On: 10/06/2017 21:02   Dg Abd Portable 1v  Result Date: 10/07/2017 CLINICAL DATA:  Orogastric tube placement. EXAM: PORTABLE ABDOMEN - 1 VIEW COMPARISON:  Radiograph of same day. FINDINGS: The bowel gas pattern is normal. Distal tip of nasogastric tube is seen in proximal stomach, with side hole at expected position of gastroesophageal junction. No radio-opaque calculi or other significant radiographic abnormality are seen. IMPRESSION: Distal tip of nasogastric tube seen in proximal stomach, with  side hole at expected position of gastroesophageal junction. Advancement is recommended. Electronically Signed   By: Marijo Conception, M.D.   On: 10/07/2017 12:18     ASSESSMENT AND PLAN: Status post cardiac arrest with respiratory failure and possible pneumonia and renal insufficiency with prior history of congestive heart failure.  Advise getting echocardiogram to evaluate wall motion.  Troponins were unremarkable and EKG just has nonspecific ST-T changes.  Ajee Heasley A

## 2017-10-07 NOTE — Progress Notes (Signed)
eeg completed ° °

## 2017-10-07 NOTE — Progress Notes (Signed)
Pharmacy Antibiotic Note  Billy Liu is a 84 y.o. male admitted on 10/06/2017 with pneumonia.  Pharmacy has been consulted for vancomycin and cefepime dosing.  Plan: DW 97 kg  Vd 68L kei 0.037 hr-1  T1/2 19 hours Vancomycin 1500 mg q 24 hours ordered. Not candidate for stacked dosing. Level before 5th dose. Goal trough 15-20.  Cefepime 2 grams q 12 hours ordered  Weight: 285 lb 7.9 oz (129.5 kg)(bed weight)  Temp (24hrs), Avg:91.8 F (33.2 C), Min:91.8 F (33.2 C), Max:91.8 F (33.2 C)  Recent Labs  Lab 10/06/17 2016  WBC 9.4  CREATININE 1.98*    Estimated Creatinine Clearance: 38.8 mL/min (A) (by C-G formula based on SCr of 1.98 mg/dL (H)).    Allergies  Allergen Reactions  . Penicillins Other (See Comments)    Per MAR     Antimicrobials this admission: Vancomycin, cefepime 9/20 >>    >>   Dose adjustments this admission:   Microbiology results: 9/30 BCx: pending 10/1 UCx: pending  10/1 MRSA PCR: pending      10/1 UA: pending 9/30 CXR: LLL opacity.   Thank you for allowing pharmacy to be a part of this patient's care.  Briceida Rasberry S 10/07/2017 12:52 AM

## 2017-10-07 NOTE — Procedures (Signed)
Oral Intubation Procedure Note  Indications: Failed self-extubation Consent: Unable to obtain consent because of altered level of consciousness. Time Out: Verified patient identification, verified procedure, site/side was marked, verified correct patient position, special equipment/implants available, medications/allergies/relevent history reviewed, required imaging and test results available.   Pre-meds: pt was already on propofol and fentanyl infusions  Neuromuscular blockade: Vecuronium 10 mg IV  Laryngoscope: #4 MAC  Visualization: cords fully visualized  ETT: 7.5 ETT passed on first attempt and secured @ 24 cm at upper incisors  Findings: laryngeal edema   Evaluation:  Tube position confirmed by auscultation and EZCap CXR pending  Pt tolerated procedure well without complications   Merton Border, MD P[facility] service Mobile [phone] Pager [phone] 10/07/2017 1:37 PM

## 2017-10-07 NOTE — H&P (Signed)
[facility] - [place] at [facility]   PATIENT NAME: Billy Liu    MR#:  [id number]  DATE OF BIRTH:  Nov 04, 1932  DATE OF ADMISSION:  10/06/2017  PRIMARY CARE PHYSICIAN: Keane Police, MD   REQUESTING/REFERRING PHYSICIAN:   CHIEF COMPLAINT:   Chief Complaint  Patient presents with  . Cardiac Arrest    HISTORY OF PRESENT ILLNESS: Billy Liu  is a 84 y.o. male with a known history of diabetes type 2, CKD, COPD, CAD, CHF, hypertension and other comorbidities. Patient is unable to provide history, due to being unresponsive.  Information was taken from reviewing the medical chart and from discussion with emergency room physician.  Pain. He was found unresponsive at his nursing home.  Per EMS report he was pulseless on the bathroom floor.  CPR was started by the nursing staff at nursing home.  Upon EMS arrival patient had a pulse, cardiac read showed bradycardia.  He required bag mask ventilation due to agonal respirations with hypoxia.  Patient was finally intubated in the emergency room including protocol was initiated. Blood test done emergency room showed potassium at 6.3, creatinine of 1.98, troponin lower than 0.03, hemoglobin 6.3.  Chest x-ray shows left lower lobe pneumonia and pulmonary edema.  Brain CT is negative for acute abnormalities. Patient is admitted to intensive care unit for further management.  PAST MEDICAL HISTORY:   Past Medical History:  Diagnosis Date  . Anxiety   . Cellulitis 10/17/2014   left lower leg  . Chronic diastolic CHF (congestive heart failure) (HCC)   . CKD (chronic kidney disease), stage III (HCC)   . COPD (chronic obstructive pulmonary disease) (HCC)   . Coronary artery disease   . Dementia    Alzheimer's   . Depression   . Diabetes mellitus without complication (HCC)   . GERD (gastroesophageal reflux disease)   . Hyperlipemia   . Hypertension   . Obesity   . Renal disorder     PAST SURGICAL  HISTORY:  Past Surgical History:  Procedure Laterality Date  . COLONOSCOPY N/A 06/20/2017   Procedure: COLONOSCOPY;  Surgeon: Toney Reil, MD;  Location: [facility] ENDOSCOPY;  Service: Gastroenterology;  Laterality: N/A;  . ESOPHAGOGASTRODUODENOSCOPY N/A 06/20/2017   Procedure: ESOPHAGOGASTRODUODENOSCOPY (EGD);  Surgeon: Toney Reil, MD;  Location: [facility] ENDOSCOPY;  Service: Gastroenterology;  Laterality: N/A;  . LOWER EXTREMITY ANGIOGRAPHY Left 02/11/2017   Procedure: LOWER EXTREMITY ANGIOGRAPHY;  Surgeon: Renford Dills, MD;  Location: [facility] INVASIVE CV LAB;  Service: Cardiovascular;  Laterality: Left;  . LOWER EXTREMITY ANGIOGRAPHY Left 03/11/2017   Procedure: LOWER EXTREMITY ANGIOGRAPHY;  Surgeon: Renford Dills, MD;  Location: [facility] INVASIVE CV LAB;  Service: Cardiovascular;  Laterality: Left;  . LOWER EXTREMITY INTERVENTION  03/11/2017   Procedure: LOWER EXTREMITY INTERVENTION;  Surgeon: Renford Dills, MD;  Location: [facility] INVASIVE CV LAB;  Service: Cardiovascular;;  . PERIPHERAL VASCULAR ATHERECTOMY Left 02/18/2017   Procedure: PERIPHERAL VASCULAR ATHERECTOMY;  Surgeon: Renford Dills, MD;  Location: [facility] INVASIVE CV LAB;  Service: Cardiovascular;  Laterality: Left;    SOCIAL HISTORY:  Social History   Tobacco Use  . Smoking status: Never Smoker  . Smokeless tobacco: Former Neurosurgeon    Types: Chew  Substance Use Topics  . Alcohol use: No    FAMILY HISTORY:  Family History  Problem Relation Age of Onset  . Cancer Brother     DRUG ALLERGIES:  Allergies  Allergen Reactions  . Penicillins Other (See Comments)  Per MAR     REVIEW OF SYSTEMS:   Unable to obtain, due to patient being unresponsive.  MEDICATIONS AT HOME:  Prior to Admission medications   Medication Sig Start Date End Date Taking? Authorizing Provider  acetaminophen (TYLENOL) 500 MG tablet Take 500 mg by mouth 3 (three) times daily.    [provider]  amLODipine (NORVASC) 10 MG  tablet Take 10 mg by mouth daily.    [provider]  aspirin EC 81 MG tablet Take 81 mg by mouth daily.    [provider]  cloNIDine (CATAPRES) 0.2 MG tablet Take 1 tablet (0.2 mg total) by mouth 3 (three) times daily. Patient taking differently: Take 0.2 mg by mouth 2 (two) times daily.  10/31/14   Houston Siren, MD  divalproex (DEPAKOTE ER) 250 MG 24 hr tablet Take 250 mg by mouth every morning.     [provider]  divalproex (DEPAKOTE ER) 500 MG 24 hr tablet  06/19/17   [provider]  divalproex (DEPAKOTE) 250 MG DR tablet  08/07/17   [provider]  divalproex (DEPAKOTE) 500 MG DR tablet Take 500 mg by mouth at bedtime.     [provider]  finasteride (PROSCAR) 5 MG tablet Take 1 tablet (5 mg total) by mouth daily. 10/31/14   Houston Siren, MD  GLUCAGEN HYPOKIT 1 MG SOLR injection  07/07/17   [provider]  glucagon, human recombinant, (GLUCAGEN DIAGNOSTIC) 1 MG injection Inject 1 mg into the muscle once as needed for low blood sugar.    [provider]  hydrALAZINE (APRESOLINE) 100 MG tablet Take 100 mg by mouth 3 (three) times daily.    [provider]  insulin detemir (LEVEMIR) 100 UNIT/ML injection Inject 34 Units into the skin 2 (two) times daily.     [provider]  insulin lispro (HUMALOG) 100 UNIT/ML injection Inject 18 units under the skin with breakfast, 24 units with lunch, 12 units with supper    [provider]  metoprolol succinate (TOPROL-XL) 100 MG 24 hr tablet Take 300 mg by mouth daily.     [provider]  omeprazole (PRILOSEC) 40 MG capsule Take 1 capsule (40 mg total) by mouth 2 (two) times daily for 14 days. 06/25/17 07/09/17  Toney Reil, MD  pantoprazole (PROTONIX) 40 MG tablet Take 1 tablet (40 mg total) by mouth daily. 06/23/17   Milagros Loll, MD  polyethylene glycol (MIRALAX / GLYCOLAX) packet Take 17 g by mouth every morning.  03/14/17   [provider]  senna (SENOKOT) 8.6 MG TABS tablet Take 2 tablets by mouth at bedtime.    [provider]  sertraline (ZOLOFT) 25 MG tablet Take 75 mg by mouth daily.     [provider]  sertraline (ZOLOFT) 50 MG tablet  08/07/17   [provider]  simvastatin (ZOCOR) 10 MG tablet  06/26/17   [provider]  simvastatin (ZOCOR) 20 MG tablet Take 30 mg by mouth at bedtime.     [provider]  torsemide (DEMADEX) 20 MG tablet Take 2 tablets (40 mg total) by mouth daily. Patient taking differently: Take 100 mg by mouth daily.  10/31/14   Houston Siren, MD  Vitamin D, Ergocalciferol, (DRISDOL) 50000 UNITS CAPS capsule Take 50,000 Units by mouth every 30 (thirty) days.     [provider]      PHYSICAL EXAMINATION:   VITAL SIGNS: Blood pressure (!) 131/58, pulse (!) 42,  temperature (S) (!) 91.8 F (33.2 C), temperature source Rectal, resp. rate 16, weight 129.5 kg, SpO2 100 %.  GENERAL:  84 y.o.-year-old patient lying in the bed, mechanically intubated.  He appears acutely ill. EYES: Pupils equal, round, reactive to light and accommodation. No scleral icterus. Extraocular muscles intact.  HEENT: Head atraumatic, normocephalic. Oropharynx and nasopharynx clear.  NECK:  Supple, no jugular venous distention. No thyroid enlargement, no tenderness.  LUNGS: Patient is connected to the ventilator.  Reduced breath sounds with rhonchi heard bilaterally. No use of accessory muscles of respiration.  CARDIOVASCULAR: S1, S2 normal. No S3/S4.  ABDOMEN: Soft, nondistended. Bowel sounds present. No organomegaly or mass.  EXTREMITIES: 2+ bilateral pedal edema.  NEUROLOGIC: Unable to perform neurologic exam due to patient being sedated, responsive. PSYCHIATRIC: The patient is sedated, responsive.  SKIN: Left foot diabetic ulcer with drainage and right leg diabetic ulcer with drainage are noted.   LABORATORY PANEL:   CBC Recent Labs  Lab  10/06/17 2016  WBC 9.4  HGB 6.3*  HCT 20.4*  PLT 333  MCV 85.6  MCH 26.3  MCHC 30.7*  RDW 18.6*  LYMPHSABS 2.5  MONOABS 0.5  EOSABS 0.1  BASOSABS 0.0   ------------------------------------------------------------------------------------------------------------------  Chemistries  Recent Labs  Lab 10/06/17 2016  NA 132*  K 6.3*  CL 98  CO2 22  GLUCOSE 210*  BUN 62*  CREATININE 1.98*  CALCIUM 8.4*  AST 51*  ALT 25  ALKPHOS 91  BILITOT 0.5   ------------------------------------------------------------------------------------------------------------------ estimated creatinine clearance is 38.8 mL/min (A) (by C-G formula based on SCr of 1.98 mg/dL (H)). ------------------------------------------------------------------------------------------------------------------ No results for input(s): TSH, T4TOTAL, T3FREE, THYROIDAB in the last 72 hours.  Invalid input(s): FREET3   Coagulation profile Recent Labs  Lab 10/06/17 2016  INR 1.47   ------------------------------------------------------------------------------------------------------------------- No results for input(s): DDIMER in the last 72 hours. -------------------------------------------------------------------------------------------------------------------  Cardiac Enzymes Recent Labs  Lab 10/06/17 2016  TROPONINI <0.03   ------------------------------------------------------------------------------------------------------------------ Invalid input(s): POCBNP  ---------------------------------------------------------------------------------------------------------------  Urinalysis    Component Value Date/Time   COLORURINE YELLOW (A) 06/17/2017 2146   APPEARANCEUR CLEAR (A) 06/17/2017 2146   APPEARANCEUR Clear 07/04/2013 1030   LABSPEC 1.009 06/17/2017 2146   LABSPEC 1.012 07/04/2013 1030   PHURINE 5.0 06/17/2017 2146   GLUCOSEU NEGATIVE 06/17/2017 2146   GLUCOSEU Negative 07/04/2013 1030    HGBUR NEGATIVE 06/17/2017 2146   BILIRUBINUR NEGATIVE 06/17/2017 2146   BILIRUBINUR Negative 07/04/2013 1030   KETONESUR NEGATIVE 06/17/2017 2146   PROTEINUR NEGATIVE 06/17/2017 2146   NITRITE POSITIVE (A) 06/17/2017 2146   LEUKOCYTESUR SMALL (A) 06/17/2017 2146   LEUKOCYTESUR Negative 07/04/2013 1030     RADIOLOGY: Dg Abd 1 View  Result Date: 10/06/2017 CLINICAL DATA:  OG tube EXAM: ABDOMEN - 1 VIEW COMPARISON:  None. FINDINGS: By history patient has an OG tube. Just to the RIGHT of the thoracic spine, there is a radiopaque line, possibly an orogastric tube. However this is slightly to the RIGHT of the expected course of the esophagus and does not extend into the LEFT UPPER QUADRANT. It is possible that orogastric tube has been withdrawn or removed and not included on the film. There is dense opacification of the LEFT lung base. Visualized bowel gas pattern is nonobstructive. IMPRESSION: Possible orogastric tube to the distal esophagus, but this appears slightly to the RIGHT of the expected course of the esophagus. Recommend confirmation of orogastric tube placement and consider advancing tube further into the stomach if this is felt to be the orogastric tube. Significant  LEFT LOWER lobe opacity. Electronically Signed   By: Norva Pavlov M.D.   On: 10/06/2017 21:00   Ct Head Wo Contrast  Result Date: 10/06/2017 CLINICAL DATA:  Altered mental status (AMS), unclear cause; C-spine trauma, high clinical risk (NEXUS/CCR). Collapsed in the bathroom. EXAM: CT HEAD WITHOUT CONTRAST CT CERVICAL SPINE WITHOUT CONTRAST TECHNIQUE: Multidetector CT imaging of the head and cervical spine was performed following the standard protocol without intravenous contrast. Multiplanar CT image reconstructions of the cervical spine were also generated. COMPARISON:  Head CT 06/17/2017 FINDINGS: CT HEAD FINDINGS Brain: Unchanged degree of atrophy and chronic small vessel ischemia. Encephalomalacia in the left parietal  lobe again seen. No intracranial hemorrhage, mass effect, or midline shift. No hydrocephalus. The basilar cisterns are patent. No evidence of territorial infarct or acute ischemia. No extra-axial or intracranial fluid collection. Vascular: Atherosclerosis of skullbase vasculature without hyperdense vessel or abnormal calcification. Skull: Prior left frontoparietal craniotomy.  No skull fracture. Sinuses/Orbits: Paranasal sinuses and mastoid air cells are clear. The visualized orbits are unremarkable. Bilateral cataract resection. Other: None. CT CERVICAL SPINE FINDINGS Alignment: Straightening of normal cervical lordosis. Skull base and vertebrae: No acute fracture. Bulky anterior osteophytes from C3-C4 through C6-C7, well corticated lucency through the osteophytes at C4 and C5 may represent remote prior trauma. The dens and skull base are intact. Soft tissues and spinal canal: Debris in the hypopharynx limits assessment for prevertebral edema, no gross prevertebral soft tissue thickening. No evidence of canal hematoma. Disc levels: Near complete disc space loss at C2-C3 with bony ankylosis of the posterior elements on the left. Disc space narrowing at C5-C6 and C6-C7. 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 Bulky anterior osteophytes throughout, some of which are fragmented. Multilevel facet arthropathy. Upper chest: Moderate right pleural effusion, partially included. Other: Soft tissue prominence involving the right aspect of the hypopharynx is incompletely included in the field of view, causing mild leftward endotracheal and enteric tube deviation. There are dense vascular calcifications. IMPRESSION: 1.  No acute intracranial abnormality.  No skull fracture. 2. Unchanged atrophy, chronic small vessel ischemia, and left parietal encephalomalacia. 3. Advanced multilevel degenerative change in the cervical spine without evidence of acute fracture. 4. Soft tissue prominence involving the right  hypopharynx is only partially included in the field of view, this may represent retained secretions or asymmetric positioning of the patient's tongue, however underlying mass lesion is not excluded. Consider contrast enhanced CT of the neck as clinically indicated. 5. Moderate right pleural effusion partially included in the field of view. Electronically Signed   By: Narda Rutherford M.D.   On: 10/06/2017 21:38   Ct Cervical Spine Wo Contrast  Result Date: 10/06/2017 CLINICAL DATA:  Altered mental status (AMS), unclear cause; C-spine trauma, high clinical risk (NEXUS/CCR). Collapsed in the bathroom. EXAM: CT HEAD WITHOUT CONTRAST CT CERVICAL SPINE WITHOUT CONTRAST TECHNIQUE: Multidetector CT imaging of the head and cervical spine was performed following the standard protocol without intravenous contrast. Multiplanar CT image reconstructions of the cervical spine were also generated. COMPARISON:  Head CT 06/17/2017 FINDINGS: CT HEAD FINDINGS Brain: Unchanged degree of atrophy and chronic small vessel ischemia. Encephalomalacia in the left parietal lobe again seen. No intracranial hemorrhage, mass effect, or midline shift. No hydrocephalus. The basilar cisterns are patent. No evidence of territorial infarct or acute ischemia. No extra-axial or intracranial fluid collection. Vascular: Atherosclerosis of skullbase vasculature without hyperdense vessel or abnormal calcification. Skull: Prior left frontoparietal craniotomy.  No skull fracture. Sinuses/Orbits: Paranasal sinuses and mastoid  air cells are clear. The visualized orbits are unremarkable. Bilateral cataract resection. Other: None. CT CERVICAL SPINE FINDINGS Alignment: Straightening of normal cervical lordosis. Skull base and vertebrae: No acute fracture. Bulky anterior osteophytes from C3-C4 through C6-C7, well corticated lucency through the osteophytes at C4 and C5 may represent remote prior trauma. The dens and skull base are intact. Soft tissues and  spinal canal: Debris in the hypopharynx limits assessment for prevertebral edema, no gross prevertebral soft tissue thickening. No evidence of canal hematoma. Disc levels: Near complete disc space loss at C2-C3 with bony ankylosis of the posterior elements on the left. Disc space narrowing at C5-C6 and C6-C7. 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 Bulky anterior osteophytes throughout, some of which are fragmented. Multilevel facet arthropathy. Upper chest: Moderate right pleural effusion, partially included. Other: Soft tissue prominence involving the right aspect of the hypopharynx is incompletely included in the field of view, causing mild leftward endotracheal and enteric tube deviation. There are dense vascular calcifications. IMPRESSION: 1.  No acute intracranial abnormality.  No skull fracture. 2. Unchanged atrophy, chronic small vessel ischemia, and left parietal encephalomalacia. 3. Advanced multilevel degenerative change in the cervical spine without evidence of acute fracture. 4. Soft tissue prominence involving the right hypopharynx is only partially included in the field of view, this may represent retained secretions or asymmetric positioning of the patient's tongue, however underlying mass lesion is not excluded. Consider contrast enhanced CT of the neck as clinically indicated. 5. Moderate right pleural effusion partially included in the field of view. Electronically Signed   By: Narda Rutherford M.D.   On: 10/06/2017 21:38   Dg Chest Portable 1 View  Result Date: 10/06/2017 CLINICAL DATA:  Ett tube EXAM: PORTABLE CHEST 1 VIEW COMPARISON:  06/20/2017 FINDINGS: Interval placement of endotracheal tube, tip approximately 3.0 centimeters above the carina. An orogastric tube is in place, tip overlying the level of the LOWER esophagus but difficult to confirm. The heart is enlarged. There is dense opacity in the LEFT lung base. Perihilar opacities are consistent with pulmonary  edema. IMPRESSION: Interval placement of endotracheal tube and orogastric tube. LEFT LOWER lobe opacity and pulmonary edema. Electronically Signed   By: Norva Pavlov M.D.   On: 10/06/2017 21:02    EKG: Orders placed or performed during the hospital encounter of 10/06/17  . EKG 12-Lead  . EKG 12-Lead  . EKG 12-Lead  . EKG 12-Lead  . EKG 12-Lead  . EKG 12-Lead    IMPRESSION AND PLAN:  1.  Acute respiratory failure likely secondary to left lower lobe pneumonia and acute pulmonary edema.  We will start treatment with IV antibiotics, duo nebs and Lasix IV.  Continue vent support. 2.  Acute on chronic diastolic CHF.  Will check 2D echo for further evaluation of the heart function.  Start treatment with IV Lasix. 3.  History of hypertension.  Blood pressure is currently on the low side, will hold BP medications, except for Lasix IV. 4. HCAP, will treat with broad-spectrum IV antibiotics, nebulizer treatment and ventilator support. 5.  Acute encephalopathy, status post cardiac arrest.  There is concern for possible anoxic brain injury.  We will continue to monitor clinically closely while treating the underlying disease. 6.  Anemia, without obvious active bleeding.  Will transfuse packed RBC, as needed for hemoglobin level lower than 7. 7.  Diabetes type 2.  Will monitor blood sugars every 6 hours and use insulin treatment during the hospital stay.  All the records are  reviewed and case discussed with ED and ICU providers.   CODE STATUS: Full    Code Status Orders  (From admission, onward)         Start     Ordered   10/07/17 0023  Full code  Continuous     10/07/17 0031        Code Status History    Date Active Date Inactive Code Status Order ID Comments User Context   06/17/2017 1856 06/23/2017 2244 Full Code [id number]  [name], MD Inpatient   02/18/2017 1623 02/18/2017 2133 Full Code [id number]  Renford Dills, MD Inpatient   02/11/2017 1138 02/11/2017 1736 Full Code  [id number]  Renford Dills, MD Inpatient   10/24/2014 1934 10/31/2014 1837 Full Code [id number]  Enedina Finner, MD Inpatient   10/11/2014 2347 10/17/2014 1844 Full Code [id number]  Oralia Manis, MD Inpatient       TOTAL TIME TAKING CARE OF THIS PATIENT: 60 minutes.    Cammy Copa M.D on 10/07/2017 at 12:38 AM  Between 7am to 6pm - Pager - [phone]  After 6pm go to [email or web address] - password [email or web address] [facility] [place] at [facility]  [phone]  CC: Primary care physician; Keane Police, MD

## 2017-10-07 NOTE — Consult Note (Signed)
WOC Nurse wound consult note Reason for Consult: Patient with partial thickness skin loss at left great toe and full thickness ulcer at the posterior right LE. Patient rotates right LE laterally and pressure may also be a contributing factor to the full thickness wound Wound type: neuropathic, venous insufficiency Pressure Injury POA: Yes/NA Measurement: Left great toe skin loss and hyperkeratotic tissue encompasses the entire distal tip of toe. No drainage Right poterior LE:  10cm x 3cm x 0.2cm with 40% yellow and 60% red tissue, small to moderate amount of light yellow exudate Wound bed:As described above Drainage (amount, consistency, odor) As described above Periwound: intact, dry, edematous (Bialterally) Dressing procedure/placement/frequency:I will initiate a POC that applies white petrolatum gauze to the left great toe daily and that uses a silver hydrofiber absorbent dressing to the right posterior LE wound.  A sacral prophylactic dressing will be placed to assist with pressure redistribution over the sacral plate; it is noted that the patient is requiring temperature regulations as well.  He is on a mattress replacement with low air loss feature and is being turned from side to side.  I will today provide pressure redistribution heel boots to redistribute pressure over the ulcer due to patient's propensity for lateral rotation of the foot and LE.  WOC nursing team will not follow, but will remain available to this patient, the nursing and medical teams.  Please re-consult if needed. Thanks, Ladona Mow, MSN, RN, GNP, [place]  Pager# [phone]

## 2017-10-07 NOTE — Progress Notes (Signed)
Pt self extubated. He was reintubated with a 7.5 ETT and 24 at the lip.

## 2017-10-07 NOTE — Progress Notes (Signed)
Pharmacy ICU Monitoring Consult:  Pharmacy consulted to assist in monitoring and replacing electrolytes in this 84 y.o. male admitted on 10/06/2017 with Cardiac Arrest   Labs:  Sodium (mmol/L)  Date Value  10/07/2017 137  07/04/2013 133 (L)   Potassium (mmol/L)  Date Value  10/07/2017 4.6  07/04/2013 4.3   Magnesium (mg/dL)  Date Value  56/21/3086 2.8 (H)   Phosphorus (mg/dL)  Date Value  57/84/6962 5.0 (H)   Calcium (mg/dL)  Date Value  95/28/4132 7.9 (L)   Calcium, Total (mg/dL)  Date Value  44/01/270 8.7   Albumin (g/dL)  Date Value  53/66/4403 3.5  07/04/2013 3.6    Assessment/Plan: 1. Electrolytes: no replacement warranted. Will recheck electrolytes with am labs.   2. Glucose mangagment: patient with hypoglycemia. Will continue to monitor.   3. Constipation Management: will continue to monitor and add as appropriate.   Pharmacy will continue to monitor and adjust per consult.  Lavaya Defreitas L 10/07/2017 8:54 PM

## 2017-10-07 NOTE — Progress Notes (Signed)
Inpatient Diabetes Program Recommendations  AACE/ADA: New Consensus Statement on Inpatient Glycemic Control (2015)  Target Ranges:  Prepandial:   less than 140 mg/dL      Peak postprandial:   less than 180 mg/dL (1-2 hours)      Critically ill patients:  140 - 180 mg/dL   Results for Billy Liu, Billy Liu (MRN [id number]) as of 10/07/2017 07:14  Ref. Range 10/06/2017 20:16  Glucose Latest Ref Range: 70 - 99 mg/dL 409 (H)    Admit with: Acute Resp Failure/ Pneumonia/ Cardiac Arrest at SNF  History: DM, CKD, COPD, CHF  Home DM Meds: Levemir 34 units BID       Humalog 18 units with Breakfast/ 24 units with Lunch/ 12 units with Dinner  Current Orders: None yet     Patient Intubated.  Requiring Vasopressors.  History of Diabetes--Takes Insulin at SNF (Levemir and Humalog).     MD- Please consider starting Novolog 2-4-6 units Q4 hours per the ICU Glycemic Control Protocol order set     --Will follow patient during hospitalization--  Ambrose Finland RN, MSN, CDE Diabetes Coordinator Inpatient Glycemic Control Team Team Pager: [phone] (8a-5p)

## 2017-10-07 NOTE — ED Notes (Signed)
Report was given to ICU 

## 2017-10-07 NOTE — ED Notes (Signed)
Pt had 20mg  etomidate followed by 120mg  succinylcholine per V.o from Dr. Lyndon Code for intubation.  Pt tolerated well and was intubated (see chart and placed on vent by RT)

## 2017-10-08 ENCOUNTER — Inpatient Hospital Stay
Admit: 2017-10-08 | Discharge: 2017-10-08 | Disposition: A | Discharge status: Home / Self Care | Payer: Medicare Other | Attending: Cardiovascular Disease | Admitting: Cardiovascular Disease

## 2017-10-08 DIAGNOSIS — I469 Cardiac arrest, cause unspecified: Principal | ICD-10-CM

## 2017-10-08 LAB — BLOOD GAS, ARTERIAL
ACID-BASE EXCESS: 1.7 mmol/L (ref 0.0–2.0)
BICARBONATE: 27.2 mmol/L (ref 20.0–28.0)
FIO2: 0.3
MECHVT: 500 mL
O2 SAT: 95.3 %
PEEP/CPAP: 5 cmH2O
PH ART: 7.39 (ref 7.350–7.450)
Patient temperature: 37
RATE: 16 resp/min
pCO2 arterial: 45 mmHg (ref 32.0–48.0)
pO2, Arterial: 78 mmHg — ABNORMAL LOW (ref 83.0–108.0)

## 2017-10-08 LAB — URINE CULTURE: CULTURE: NO GROWTH

## 2017-10-08 LAB — BASIC METABOLIC PANEL
ANION GAP: 6 (ref 5–15)
BUN: 51 mg/dL — ABNORMAL HIGH (ref 8–23)
CHLORIDE: 103 mmol/L (ref 98–111)
CO2: 26 mmol/L (ref 22–32)
Calcium: 7.8 mg/dL — ABNORMAL LOW (ref 8.9–10.3)
Creatinine, Ser: 1.49 mg/dL — ABNORMAL HIGH (ref 0.61–1.24)
GFR calc Af Amer: 48 mL/min — ABNORMAL LOW (ref >60)
GFR calc non Af Amer: 42 mL/min — ABNORMAL LOW (ref >60)
GLUCOSE: 122 mg/dL — AB (ref 70–99)
Potassium: 4.6 mmol/L (ref 3.5–5.1)
Sodium: 135 mmol/L (ref 135–145)

## 2017-10-08 LAB — MAGNESIUM: Magnesium: 2.7 mg/dL — ABNORMAL HIGH (ref 1.7–2.4)

## 2017-10-08 LAB — ECHOCARDIOGRAM COMPLETE
Height: 75 in
Weight: 4515.02 oz

## 2017-10-08 LAB — CBC
HCT: 20.7 % — ABNORMAL LOW (ref 40.0–52.0)
Hemoglobin: 6.9 g/dL — ABNORMAL LOW (ref 13.0–18.0)
MCH: 27.8 pg (ref 26.0–34.0)
MCHC: 33.3 g/dL (ref 32.0–36.0)
MCV: 83.7 fL (ref 80.0–100.0)
Platelets: 238 10*3/uL (ref 150–440)
RBC: 2.48 MIL/uL — ABNORMAL LOW (ref 4.40–5.90)
RDW: 18.1 % — ABNORMAL HIGH (ref 11.5–14.5)
WBC: 6.7 10*3/uL (ref 3.8–10.6)

## 2017-10-08 LAB — GLUCOSE, CAPILLARY
GLUCOSE-CAPILLARY: 121 mg/dL — AB (ref 70–99)
GLUCOSE-CAPILLARY: 167 mg/dL — AB (ref 70–99)
GLUCOSE-CAPILLARY: 204 mg/dL — AB (ref 70–99)
Glucose-Capillary: 129 mg/dL — ABNORMAL HIGH (ref 70–99)
Glucose-Capillary: 147 mg/dL — ABNORMAL HIGH (ref 70–99)
Glucose-Capillary: 154 mg/dL — ABNORMAL HIGH (ref 70–99)

## 2017-10-08 LAB — PREPARE RBC (CROSSMATCH)

## 2017-10-08 LAB — HEMOGLOBIN AND HEMATOCRIT, BLOOD
HEMATOCRIT: 24.9 % — AB (ref 40.0–52.0)
Hemoglobin: 8.3 g/dL — ABNORMAL LOW (ref 13.0–18.0)

## 2017-10-08 LAB — PHOSPHORUS: Phosphorus: 4.4 mg/dL (ref 2.5–4.6)

## 2017-10-08 MED ORDER — SODIUM CHLORIDE 0.9% IV SOLUTION
Freq: Once | INTRAVENOUS | Status: AC
  Administered 2017-10-08: 07:00:00 via INTRAVENOUS

## 2017-10-08 NOTE — Progress Notes (Signed)
Pharmacy ICU Monitoring Consult:  Pharmacy consulted to assist in monitoring and replacing electrolytes in this 84 y.o. male admitted on 10/06/2017 with Cardiac Arrest   Labs:  Sodium (mmol/L)  Date Value  10/08/2017 135  07/04/2013 133 (L)   Potassium (mmol/L)  Date Value  10/08/2017 4.6  07/04/2013 4.3   Magnesium (mg/dL)  Date Value  16/10/9602 2.7 (H)   Phosphorus (mg/dL)  Date Value  54/09/8117 4.4   Calcium (mg/dL)  Date Value  14/78/2956 7.8 (L)   Calcium, Total (mg/dL)  Date Value  21/30/8657 8.7   Albumin (g/dL)  Date Value  84/69/6295 3.5  07/04/2013 3.6    Assessment/Plan: 1. Electrolytes: no replacement warranted. Pharmacy will not order morning labs.  2. Glucose mangagment: patient on sliding scale insulin. Will continue to monitor.   3. Constipation Management: daily Miralax and Senokot BID. Will continue to monitor and add as appropriate.   Pharmacy will continue to monitor and adjust per consult.  Broadus John, PharmD Candidate 10/08/2017 12:49 PM

## 2017-10-08 NOTE — Progress Notes (Signed)
Patient completed 24 hours of 36 degrees TTM and rewarmed to 37 degrees. Wake up assessment performed in early afternoon when patient had finished 36 degrees. Patient became restless and agitated during wake up assessment so sedation restarted. Patient did not follow commands during shift.

## 2017-10-08 NOTE — Progress Notes (Signed)
CRITICAL CARE NOTE  CC  follow up respiratory failure  SUBJECTIVE Patient remains critically ill Prognosis is guarded Cooling phase Rewarming phase today at [facility] 10/1 admitted to ICU 10/1 hypothermia protocol 10/2 remains on vent cooling phase   BP (!) 126/53   Pulse (!) 55   Temp (!) 97 F (36.1 C) (Rectal)   Resp 18   Ht '6\' 3"'  (1.905 m)   Wt 128 kg   SpO2 100%   BMI 35.27 kg/m    REVIEW OF SYSTEMS  PATIENT IS UNABLE TO PROVIDE COMPLETE REVIEW OF SYSTEMS DUE TO SEVERE CRITICAL ILLNESS   PHYSICAL EXAMINATION:  GENERAL:critically ill appearing, +resp distress HEAD: Normocephalic, atraumatic.  EYES: Pupils equal, round, reactive to light.  No scleral icterus.  MOUTH: Moist mucosal membrane. NECK: Supple. No thyromegaly. No nodules. No JVD.  PULMONARY: +rhonchi, +wheezing CARDIOVASCULAR: S1 and S2. Regular rate and rhythm. No murmurs, rubs, or gallops.  GASTROINTESTINAL: Soft, nontender, -distended. No masses. Positive bowel sounds. No hepatosplenomegaly.  MUSCULOSKELETAL: No swelling, clubbing, or edema.  NEUROLOGIC: obtunded, GCS<8 SKIN:intact,warm,dry      Indwelling Urinary Catheter continued, requirement due to   Reason to continue Indwelling Urinary Catheter for strict Intake/Output monitoring for hemodynamic instability         Ventilator continued, requirement due to, resp failure    Ventilator Sedation RASS 0 to -2     ASSESSMENT AND PLAN SYNOPSIS  84 yo with acute cardiac arrest sudden cardiac death and severe resp failure on vent support and hypothermia protocol  Severe Hypoxic and Hypercapnic Respiratory Failure -continue Full MV support -continue Bronchodilator Therapy -Wean Fio2 and PEEP as tolerated -will perform SAT/SBT when respiratory parameters are met   CARDIAC FAILURE- -oxygen as needed -Lasix as tolerated -follow up cardiac enzymes as indicated   Renal Failure-most likely due to  ATN -follow chem 7 -follow UO -continue Foley Catheter-assess need daily   NEUROLOGY - intubated and sedated - minimal sedation to achieve a RASS goal: -1 Wake up assessment pending    CARDIAC ICU monitoring  ID -continue IV abx as prescibed -follow up cultures  GI/Nutrition GI PROPHYLAXIS as indicated DIET-->TF's as tolerated Constipation protocol as indicated  ENDO - ICU hypoglycemic\Hyperglycemia protocol -check FSBS per protocol   ELECTROLYTES -follow labs as needed -replace as needed -pharmacy consultation and following   DVT/GI PRX ordered TRANSFUSIONS AS NEEDED MONITOR FSBS ASSESS the need for LABS as needed   Critical Care Time devoted to patient care services described in this note is 35 minutes.   Overall, patient is critically ill, prognosis is guarded.  Patient with Multiorgan failure and at high risk for cardiac arrest and death.    Corrin Parker, M.D.  [facility]  Medical Director [place] Director [facility] Cardio-Pulmonary Department

## 2017-10-08 NOTE — Progress Notes (Signed)
SUBJECTIVE: Pt intubated and sedated.   Vitals:   10/08/17 0715 10/08/17 0730 10/08/17 0745 10/08/17 0800  BP: (!) 138/49 (!) 126/48 (!) 126/53 (!) 110/40  Pulse: (!) 54 (!) 56 (!) 55 (!) 52  Resp: 17 18 18 18   Temp:    (!) 97 F (36.1 C)  TempSrc:    Rectal  SpO2: 98% 99% 100% 99%  Weight:      Height:        Intake/Output Summary (Last 24 hours) at 10/08/2017 0845 Last data filed at 10/08/2017 0800 Gross per 24 hour  Intake 2799.72 ml  Output 1010 ml  Net 1789.72 ml    LABS: Basic Metabolic Panel: Recent Labs    10/07/17 0127  10/07/17 2016 10/08/17 0423  NA  --    < > 137 135  K  --    < > 4.6 4.6  CL  --    < > 105 103  CO2  --    < > 26 26  GLUCOSE  --    < > 98 122*  BUN  --    < > 51* 51*  CREATININE  --    < > 1.46* 1.49*  CALCIUM  --    < > 7.9* 7.8*  MG 2.8*  --   --  2.7*  PHOS 5.0*  --   --  4.4   < > = values in this interval not displayed.   Liver Function Tests: Recent Labs    10/06/17 2016  AST 51*  ALT 25  ALKPHOS 91  BILITOT 0.5  PROT 7.0  ALBUMIN 3.5   No results for input(s): LIPASE, AMYLASE in the last 72 hours. CBC: Recent Labs    10/06/17 2016 10/07/17 0606 10/08/17 0423  WBC 9.4 5.7 6.7  NEUTROABS 6.3 4.5  --   HGB 6.3* 6.5* 6.9*  HCT 20.4* 19.7* 20.7*  MCV 85.6 82.5 83.7  PLT 333 224 238   Cardiac Enzymes: Recent Labs    10/07/17 0806 10/07/17 1436 10/07/17 2016  TROPONINI <0.03 <0.03 <0.03   BNP: Invalid input(s): POCBNP D-Dimer: No results for input(s): DDIMER in the last 72 hours. Hemoglobin A1C: No results for input(s): HGBA1C in the last 72 hours. Fasting Lipid Panel: No results for input(s): CHOL, HDL, LDLCALC, TRIG, CHOLHDL, LDLDIRECT in the last 72 hours. Thyroid Function Tests: No results for input(s): TSH, T4TOTAL, T3FREE, THYROIDAB in the last 72 hours.  Invalid input(s): FREET3 Anemia Panel: No results for input(s): VITAMINB12, FOLATE, FERRITIN, TIBC, IRON, RETICCTPCT in the last 72  hours.   PHYSICAL EXAM General: Critically ill elderly gentleman, sedated and intubated. HEENT:  Normocephalic and atramatic Neck:  No JVD.  Lungs: Clear  Heart: HRRR . Normal S1 and S2 without gallops or murmurs.  Abdomen: Soft, no bowl sounds noted Extremities: Legs are very swollen, tight, no pitting,  Neuro: Sedated, intubated Psych: Sedated, intubated  TELEMETRY: Sinus bradycardia 55bpm  ASSESSMENT AND PLAN: Status post cardiac arrest with respiratory failure. Pneumonia and renal insufficieny with history of CHF. Remains intubated and sedated, troponin negative. Cooling completed, warming started.  Echo is pending reading.  Advise conservative treatment until patients neurologic status is clear.  Continue lasix and respiratory support as needed.   Active Problems:   Cardiac arrest [facility])    Caroleen Hamman, NP-C 10/08/2017 8:45 AM Cell: [phone]

## 2017-10-08 NOTE — Progress Notes (Addendum)
Patient ID: Billy Liu, male   DOB: 02-Jan-1933, 84 y.o.   MRN: [id number]  [facility] PROGRESS NOTE  Philip Kotlyar YIF:[id number] DOB: 11-18-1932 DOA: 10/06/2017 PCP: Alvester Morin, MD  HPI/Subjective: Patient opens his eyes to voice and starting to move his arms.  He is undergoing rewarming with the hypothermia protocol.  Objective: Vitals:   10/08/17 1530 10/08/17 1545  BP: (!) 126/41 (!) 137/45  Pulse: 61 62  Resp: 17 18  Temp:    SpO2: 97% 97%    Filed Weights   10/06/17 2039 10/07/17 0200 10/08/17 0500  Weight: 129.5 kg 125.8 kg 128 kg    ROS: Review of Systems  Unable to perform ROS: Acuity of condition   Exam: Physical Exam  Constitutional: He appears lethargic. He is intubated.  HENT:  Nose: Mucosal edema present.  Unable to look into mouth  Eyes: Conjunctivae and lids are normal.  Pupils pinpoint  Neck: Carotid bruit is not present.  Cardiovascular: Regular rhythm, S1 normal, S2 normal and normal heart sounds.  Respiratory: He is intubated. He has decreased breath sounds in the right lower field and the left lower field. He has no wheezes. He has rhonchi in the right lower field and the left lower field.  GI: Bowel sounds are normal. There is no tenderness.  Musculoskeletal:       Right ankle: He exhibits swelling.       Left ankle: He exhibits swelling.  Neurological: He appears lethargic.  Opens eyes and moves arms with stimulation  Skin: Skin is warm.  Right posterior calf large area of greenish discoloration and foul-smelling in an ulcer.  Left foot first toe with small ulceration and very swollen first toe.  Likely from chronic infection  Psychiatric:  Opens eyes and moves his arms to stimulation      Data Reviewed: Basic Metabolic Panel: Recent Labs  Lab 10/06/17 2016 10/07/17 0127 10/07/17 0409 10/07/17 0806 10/07/17 2016 10/08/17 0423  NA 132*  --   --  136 137 135  K 6.3*  --  4.7 4.4 4.6 4.6  CL 98  --   --  101  105 103  CO2 22  --   --  '26 26 26  ' GLUCOSE 210*  --   --  69* 98 122*  BUN 62*  --   --  59* 51* 51*  CREATININE 1.98*  --   --  1.60* 1.46* 1.49*  CALCIUM 8.4*  --   --  8.0* 7.9* 7.8*  MG  --  2.8*  --   --   --  2.7*  PHOS  --  5.0*  --   --   --  4.4   Liver Function Tests: Recent Labs  Lab 10/06/17 2016  AST 51*  ALT 25  ALKPHOS 91  BILITOT 0.5  PROT 7.0  ALBUMIN 3.5   CBC: Recent Labs  Lab 10/06/17 2016 10/07/17 0606 10/08/17 0423 10/08/17 1120  WBC 9.4 5.7 6.7  --   NEUTROABS 6.3 4.5  --   --   HGB 6.3* 6.5* 6.9* 8.3*  HCT 20.4* 19.7* 20.7* 24.9*  MCV 85.6 82.5 83.7  --   PLT 333 224 238  --    Cardiac Enzymes: Recent Labs  Lab 10/06/17 2016 10/07/17 0127 10/07/17 0806 10/07/17 1436 10/07/17 2016  TROPONINI <0.03 <0.03 <0.03 <0.03 <0.03    CBG: Recent Labs  Lab 10/07/17 1929 10/07/17 2352 10/08/17 0354 10/08/17 0718 10/08/17 1136  GLUCAP 96 121* 121*  129* 147*    Recent Results (from the past 240 hour(s))  Blood culture (routine x 2)     Status: None (Preliminary result)   Collection Time: 10/06/17 10:43 PM  Result Value Ref Range Status   Specimen Description BLOOD LEFT HAND   Final   Special Requests   Final    BOTTLES DRAWN AEROBIC AND ANAEROBIC Blood Culture adequate volume   Culture   Final    NO GROWTH 2 DAYS Performed at [facility], [address] [place], [place] [address]    Report Status PENDING  Incomplete  Blood culture (routine x 2)     Status: None (Preliminary result)   Collection Time: 10/06/17 10:44 PM  Result Value Ref Range Status   Specimen Description BLOOD BLOOD LEFT FOREARM  Final   Special Requests   Final    BOTTLES DRAWN AEROBIC AND ANAEROBIC Blood Culture adequate volume   Culture   Final    NO GROWTH 2 DAYS Performed at [facility], [address], [place], [place] [address]    Report Status PENDING  Incomplete  MRSA PCR Screening     Status: None   Collection Time: 10/07/17   1:07 AM  Result Value Ref Range Status   MRSA by PCR NEGATIVE NEGATIVE Final    Comment:        The GeneXpert MRSA Assay (FDA approved for NASAL specimens only), is one component of a comprehensive MRSA colonization surveillance program. It is not intended to diagnose MRSA infection nor to guide or monitor treatment for MRSA infections. Performed at [facility], [address], [place], [place] [address]   Urine Culture     Status: None   Collection Time: 10/07/17  2:24 AM  Result Value Ref Range Status   Specimen Description   Final    URINE, RANDOM Performed at [facility], [address], [place], [place] [address]    Special Requests   Final    NONE Performed at [facility], [address] [place], [place] [address]    Culture   Final    NO GROWTH Performed at [facility], [place] [address] [place], [place] [address]    Report Status 10/08/2017 FINAL  Final  Aerobic/Anaerobic Culture (surgical/deep wound)     Status: Abnormal (Preliminary result)   Collection Time: 10/07/17  2:24 AM  Result Value Ref Range Status   Specimen Description TOE LEFT  Final   Special Requests PENDING  Incomplete   Gram Stain   Final    RARE SQUAMOUS EPITHELIAL CELLS PRESENT NO WBC SEEN MODERATE GRAM POSITIVE RODS FEW GRAM POSITIVE COCCI RARE GRAM NEGATIVE RODS Performed at [facility], [address] [place], [place] [address]    Culture MULTIPLE ORGANISMS PRESENT, NONE PREDOMINANT (A)  Final   Report Status PENDING  Incomplete  Aerobic/Anaerobic Culture (surgical/deep wound)     Status: Abnormal (Preliminary result)   Collection Time: 10/07/17  2:24 AM  Result Value Ref Range Status   Specimen Description   Final    LEG RIGHT Performed at [facility], [place] [address], [place],  [address]    Special Requests   Final    NONE Performed at [facility], [address] [place],  [address]    Gram Stain    Final    NO WBC SEEN MODERATE GRAM NEGATIVE RODS FEW GRAM POSITIVE RODS RARE GRAM POSITIVE COCCI Performed at [facility], [place] [address] [place], [place] [address]    Culture MULTIPLE ORGANISMS PRESENT, NONE PREDOMINANT (A)  Final   Report Status PENDING  Incomplete     Studies: Dg Chest 1 View  Result Date: 10/07/2017 CLINICAL DATA:  Feeding tube, evaluate placement of endotracheal tube EXAM: CHEST  1 VIEW COMPARISON:  Portable chest x-ray of 10/06/2016 FINDINGS: The tip of the endotracheal tube is approximately 3.9 cm above the carina. The lungs remain poorly aerated and cardiomegaly is stable. Haziness at both lung bases may reflect atelectasis and effusion although pneumonia cannot be excluded. NG tube extends below the hemidiaphragm. IMPRESSION: 1. Endotracheal tube tip 3.9 cm above the carina. 2. Poor aeration with basilar volume loss, probable effusions, and pneumonia cannot be excluded. 3. Stable cardiomegaly. 4. NG tube extends below the hemidiaphragm. Electronically Signed   By: Ivar Drape M.D.   On: 10/07/2017 14:03   Dg Abd 1 View  Result Date: 10/07/2017 CLINICAL DATA:  Evaluate position of feeding tube EXAM: ABDOMEN - 1 VIEW COMPARISON:  Abdomen 10/07/2016 FINDINGS: A tip of the feeding tube is seen within the fundus dex proximal body of the stomach. Bowel gas pattern is nonspecific. Some volume loss is again noted at the lung bases and cardiomegaly is stable IMPRESSION: Feeding tube tip extends to the region of the fundus-proximal stomach. The bowel gas pattern is nonspecific Electronically Signed   By: Ivar Drape M.D.   On: 10/07/2017 14:04   Dg Abd 1 View  Result Date: 10/07/2017 CLINICAL DATA:  84 year old male with nasogastric tube placement. Subsequent encounter. EXAM: ABDOMEN - 1 VIEW COMPARISON:  10/07/2017 2:36 a.m. FINDINGS: Nasogastric tube radiopaque marker can only be well delineated to the level of the distal esophagus. Tip of the nasogastric tube may  enter the stomach but is not adequately assessed secondary to overlying leads in the left upper quadrant. Removal of leads or change of lead position and repeat exam may prove helpful for further delineation. Nonspecific bowel gas pattern. IMPRESSION: Nasogastric tube radiopaque marker can only be well delineated to the level of the distal esophagus. Tip of the nasogastric tube may enter the stomach but is not adequately assessed secondary to overlying leads in the left upper quadrant. Removal of leads or change of lead position and repeat exam may prove helpful for further delineation. These results will be called to the ordering clinician or representative by the Radiologist Assistant, and communication documented in the PACS or zVision Dashboard. Electronically Signed   By: Genia Del M.D.   On: 10/07/2017 08:32   Dg Abd 1 View  Result Date: 10/07/2017 CLINICAL DATA:  OG tube placement, second attempt EXAM: ABDOMEN - 1 VIEW COMPARISON:  10/07/2017 at 0228 hours FINDINGS: Enteric tube terminates in the gastric cardia with its side port at/just above the GE junction. IMPRESSION: Enteric tube terminates in the gastric cardia with its side port at/just above the GE junction. Electronically Signed   By: Julian Hy M.D.   On: 10/07/2017 02:48   Dg Abd 1 View  Result Date: 10/07/2017 CLINICAL DATA:  OG tube placement EXAM: ABDOMEN - 1 VIEW COMPARISON:  10/06/2017 FINDINGS: Enteric tube terminates at the GE junction with the side port in the distal esophagus. IMPRESSION: Enteric tube terminates at the GE junction with the side port in the distal esophagus. Electronically Signed   By: Julian Hy M.D.   On: 10/07/2017 02:47   Dg Abd 1 View  Result Date: 10/06/2017 CLINICAL DATA:  OG  tube EXAM: ABDOMEN - 1 VIEW COMPARISON:  None. FINDINGS: By history patient has an OG tube. Just to the RIGHT of the thoracic spine, there is a radiopaque line, possibly an orogastric tube. However this is slightly  to the RIGHT of the expected course of the esophagus and does not extend into the LEFT UPPER QUADRANT. It is possible that orogastric tube has been withdrawn or removed and not included on the film. There is dense opacification of the LEFT lung base. Visualized bowel gas pattern is nonobstructive. IMPRESSION: Possible orogastric tube to the distal esophagus, but this appears slightly to the RIGHT of the expected course of the esophagus. Recommend confirmation of orogastric tube placement and consider advancing tube further into the stomach if this is felt to be the orogastric tube. Significant LEFT LOWER lobe opacity. Electronically Signed   By: Nolon Nations M.D.   On: 10/06/2017 21:00   Ct Head Wo Contrast  Result Date: 10/06/2017 CLINICAL DATA:  Altered mental status (AMS), unclear cause; C-spine trauma, high clinical risk (NEXUS/CCR). Collapsed in the bathroom. EXAM: CT HEAD WITHOUT CONTRAST CT CERVICAL SPINE WITHOUT CONTRAST TECHNIQUE: Multidetector CT imaging of the head and cervical spine was performed following the standard protocol without intravenous contrast. Multiplanar CT image reconstructions of the cervical spine were also generated. COMPARISON:  Head CT 06/17/2017 FINDINGS: CT HEAD FINDINGS Brain: Unchanged degree of atrophy and chronic small vessel ischemia. Encephalomalacia in the left parietal lobe again seen. No intracranial hemorrhage, mass effect, or midline shift. No hydrocephalus. The basilar cisterns are patent. No evidence of territorial infarct or acute ischemia. No extra-axial or intracranial fluid collection. Vascular: Atherosclerosis of skullbase vasculature without hyperdense vessel or abnormal calcification. Skull: Prior left frontoparietal craniotomy.  No skull fracture. Sinuses/Orbits: Paranasal sinuses and mastoid air cells are clear. The visualized orbits are unremarkable. Bilateral cataract resection. Other: None. CT CERVICAL SPINE FINDINGS Alignment: Straightening of  normal cervical lordosis. Skull base and vertebrae: No acute fracture. Bulky anterior osteophytes from C3-C4 through C6-C7, well corticated lucency through the osteophytes at C4 and C5 may represent remote prior trauma. The dens and skull base are intact. Soft tissues and spinal canal: Debris in the hypopharynx limits assessment for prevertebral edema, no gross prevertebral soft tissue thickening. No evidence of canal hematoma. Disc levels: Near complete disc space loss at C2-C3 with bony ankylosis of the posterior elements on the left. Disc space narrowing at C5-C6 and C6-C7. 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 Bulky anterior osteophytes throughout, some of which are fragmented. Multilevel facet arthropathy. Upper chest: Moderate right pleural effusion, partially included. Other: Soft tissue prominence involving the right aspect of the hypopharynx is incompletely included in the field of view, causing mild leftward endotracheal and enteric tube deviation. There are dense vascular calcifications. IMPRESSION: 1.  No acute intracranial abnormality.  No skull fracture. 2. Unchanged atrophy, chronic small vessel ischemia, and left parietal encephalomalacia. 3. Advanced multilevel degenerative change in the cervical spine without evidence of acute fracture. 4. Soft tissue prominence involving the right hypopharynx is only partially included in the field of view, this may represent retained secretions or asymmetric positioning of the patient's tongue, however underlying mass lesion is not excluded. Consider contrast enhanced CT of the neck as clinically indicated. 5. Moderate right pleural effusion partially included in the field of view. Electronically Signed   By: Keith Rake M.D.   On: 10/06/2017 21:38   Ct Cervical Spine Wo Contrast  Result Date: 10/06/2017 CLINICAL DATA:  Altered mental  status (AMS), unclear cause; C-spine trauma, high clinical risk (NEXUS/CCR). Collapsed in the  bathroom. EXAM: CT HEAD WITHOUT CONTRAST CT CERVICAL SPINE WITHOUT CONTRAST TECHNIQUE: Multidetector CT imaging of the head and cervical spine was performed following the standard protocol without intravenous contrast. Multiplanar CT image reconstructions of the cervical spine were also generated. COMPARISON:  Head CT 06/17/2017 FINDINGS: CT HEAD FINDINGS Brain: Unchanged degree of atrophy and chronic small vessel ischemia. Encephalomalacia in the left parietal lobe again seen. No intracranial hemorrhage, mass effect, or midline shift. No hydrocephalus. The basilar cisterns are patent. No evidence of territorial infarct or acute ischemia. No extra-axial or intracranial fluid collection. Vascular: Atherosclerosis of skullbase vasculature without hyperdense vessel or abnormal calcification. Skull: Prior left frontoparietal craniotomy.  No skull fracture. Sinuses/Orbits: Paranasal sinuses and mastoid air cells are clear. The visualized orbits are unremarkable. Bilateral cataract resection. Other: None. CT CERVICAL SPINE FINDINGS Alignment: Straightening of normal cervical lordosis. Skull base and vertebrae: No acute fracture. Bulky anterior osteophytes from C3-C4 through C6-C7, well corticated lucency through the osteophytes at C4 and C5 may represent remote prior trauma. The dens and skull base are intact. Soft tissues and spinal canal: Debris in the hypopharynx limits assessment for prevertebral edema, no gross prevertebral soft tissue thickening. No evidence of canal hematoma. Disc levels: Near complete disc space loss at C2-C3 with bony ankylosis of the posterior elements on the left. Disc space narrowing at C5-C6 and C6-C7. 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 Bulky anterior osteophytes throughout, some of which are fragmented. Multilevel facet arthropathy. Upper chest: Moderate right pleural effusion, partially included. Other: Soft tissue prominence involving the right aspect of the  hypopharynx is incompletely included in the field of view, causing mild leftward endotracheal and enteric tube deviation. There are dense vascular calcifications. IMPRESSION: 1.  No acute intracranial abnormality.  No skull fracture. 2. Unchanged atrophy, chronic small vessel ischemia, and left parietal encephalomalacia. 3. Advanced multilevel degenerative change in the cervical spine without evidence of acute fracture. 4. Soft tissue prominence involving the right hypopharynx is only partially included in the field of view, this may represent retained secretions or asymmetric positioning of the patient's tongue, however underlying mass lesion is not excluded. Consider contrast enhanced CT of the neck as clinically indicated. 5. Moderate right pleural effusion partially included in the field of view. Electronically Signed   By: Keith Rake M.D.   On: 10/06/2017 21:38   Dg Chest Portable 1 View  Result Date: 10/06/2017 CLINICAL DATA:  Ett tube EXAM: PORTABLE CHEST 1 VIEW COMPARISON:  06/20/2017 FINDINGS: Interval placement of endotracheal tube, tip approximately 3.0 centimeters above the carina. An orogastric tube is in place, tip overlying the level of the LOWER esophagus but difficult to confirm. The heart is enlarged. There is dense opacity in the LEFT lung base. Perihilar opacities are consistent with pulmonary edema. IMPRESSION: Interval placement of endotracheal tube and orogastric tube. LEFT LOWER lobe opacity and pulmonary edema. Electronically Signed   By: Nolon Nations M.D.   On: 10/06/2017 21:02   Dg Abd Portable 1v  Result Date: 10/07/2017 CLINICAL DATA:  Orogastric tube placement. EXAM: PORTABLE ABDOMEN - 1 VIEW COMPARISON:  Radiograph of same day. FINDINGS: The bowel gas pattern is normal. Distal tip of nasogastric tube is seen in proximal stomach, with side hole at expected position of gastroesophageal junction. No radio-opaque calculi or other significant radiographic abnormality are  seen. IMPRESSION: Distal tip of nasogastric tube seen in proximal stomach, with side hole at  expected position of gastroesophageal junction. Advancement is recommended. Electronically Signed   By: Marijo Conception, M.D.   On: 10/07/2017 12:18    Scheduled Meds: . chlorhexidine gluconate (MEDLINE KIT)  15 mL Mouth Rinse BID  . feeding supplement (PRO-STAT SUGAR FREE 64)  60 mL Per Tube QID  . feeding supplement (VITAL HIGH PROTEIN)  1,000 mL Per Tube Q24H  . heparin  5,000 Units Subcutaneous Q8H  . Influenza vac split quadrivalent PF  0.5 mL Intramuscular Tomorrow-1000  . insulin aspart  2-6 Units Subcutaneous Q4H  . mouth rinse  15 mL Mouth Rinse 10 times per day  . multivitamin  15 mL Per Tube Daily  . polyethylene glycol  17 g Per Tube Daily  . senna-docusate  2 tablet Per Tube BID   Continuous Infusions: . sodium chloride 50 mL/hr at 10/08/17 1200  . ceFEPime (MAXIPIME) IV Stopped (10/08/17 0546)  . famotidine (PEPCID) IV Stopped (10/08/17 1008)  . fentaNYL infusion INTRAVENOUS 225 mcg/hr (10/08/17 1200)  . norepinephrine (LEVOPHED) Adult infusion 1 mcg/min (10/08/17 1131)  . propofol (DIPRIVAN) infusion 30 mcg/kg/min (10/08/17 1606)  . valproate sodium 52.5 mL/hr at 10/08/17 1121    Assessment/Plan:  1. Cardiac arrest.  Patient on hypothermia protocol as per critical care team and is undergoing rewarming protocol.  Continue supportive care.  Patient starting to open eyes and move arms.  Continue to assess mental status and signs for anoxic encephalopathy.  Placed on empiric Depacon. 2. Acute hypoxic respiratory failure.  Continue ventilator support.  When I saw him he was on 30% FiO2. 3. Left lower lobe pneumonia, right lower extremity ulceration with foul-smelling discharge, left first toe ulceration and swelling of the first toe likely chronic ulceration on aggressive antibiotics. 4. Hypotension.  Off pressors 5. Acute kidney injury and hyperkalemia.  Creatinine has improved down  to 1.49 and potassium is 4.6 6. History of chronic diastolic congestive heart failure 7. Overall prognosis poor and high risk for cardiopulmonary arrest.  Patient listed as full code. 8. Anemia looks like responded to blood transfusion.  Code Status:     Code Status Orders  (From admission, onward)         Start     Ordered   10/07/17 0132  Full code  Continuous     10/07/17 0131        Code Status History    Date Active Date Inactive Code Status Order ID Comments User Context   10/07/2017 0031 10/07/2017 0131 Full Code [id number]  Awilda Bill, NP ED   06/17/2017 1856 06/23/2017 2244 Full Code [id number]  Nicholes Mango, MD Inpatient   02/18/2017 1623 02/18/2017 2133 Full Code [id number]  Delana Meyer, Dolores Lory, MD Inpatient   02/11/2017 1138 02/11/2017 1736 Full Code [id number]  Schnier, Dolores Lory, MD Inpatient   10/24/2014 1934 10/31/2014 1837 Full Code [id number]  Fritzi Mandes, MD Inpatient   10/11/2014 2347 10/17/2014 1844 Full Code [id number]  Lance Coon, MD Inpatient     Family Communication: As per critical care team Disposition Plan: To be determined  Consultants:  Critical care specialist  Antibiotics:  Cefepime  Time spent: 25 minutes.  Case discussed with critical care specialist.  [facility]

## 2017-10-08 NOTE — Progress Notes (Signed)
Daily Progress Note   Patient Name: Billy Liu       Date: 10/08/2017 DOB: 10-Apr-1932  Age: 84 y.o. MRN#: [id number] Attending Physician: Loletha Grayer, MD Primary Care Physician: Alvester Morin, MD Admit Date: 10/06/2017  Reason for Consultation/Follow-up: Psychosocial/spiritual support  Subjective: Patient is resting in bed on ventilator. Mittens in place. Patient self extubated yesterday and was reintubated.  CCM plan for initiation of rewarming phase today. No family at bedside.   Length of Stay: 1  Current Medications: Scheduled Meds:  . sodium chloride   Intravenous Once  . chlorhexidine gluconate (MEDLINE KIT)  15 mL Mouth Rinse BID  . feeding supplement (PRO-STAT SUGAR FREE 64)  60 mL Per Tube QID  . feeding supplement (VITAL HIGH PROTEIN)  1,000 mL Per Tube Q24H  . heparin  5,000 Units Subcutaneous Q8H  . Influenza vac split quadrivalent PF  0.5 mL Intramuscular Tomorrow-1000  . insulin aspart  2-6 Units Subcutaneous Q4H  . mouth rinse  15 mL Mouth Rinse 10 times per day  . multivitamin  15 mL Per Tube Daily  . polyethylene glycol  17 g Per Tube Daily  . senna-docusate  2 tablet Per Tube BID    Continuous Infusions: . sodium chloride 50 mL/hr at 10/08/17 0900  . ceFEPime (MAXIPIME) IV Stopped (10/08/17 0546)  . famotidine (PEPCID) IV 20 mg (10/08/17 7473)  . fentaNYL infusion INTRAVENOUS 225 mcg/hr (10/08/17 0900)  . norepinephrine (LEVOPHED) Adult infusion Stopped (10/08/17 0846)  . propofol (DIPRIVAN) infusion 35 mcg/kg/min (10/08/17 0900)  . valproate sodium Stopped (10/08/17 0350)    PRN Meds: acetaminophen **OR** acetaminophen, atropine, bisacodyl, fentaNYL, HYDROcodone-acetaminophen, ondansetron **OR** ondansetron (ZOFRAN) IV,  traZODone  Physical Exam  Constitutional: No distress.  Pulmonary/Chest:  Intubated            Vital Signs: BP (!) 121/42   Pulse (!) 55   Temp (!) 96.8 F (36 C) (Rectal)   Resp 18   Ht '6\' 3"'  (1.905 m)   Wt 128 kg   SpO2 99%   BMI 35.27 kg/m  SpO2: SpO2: 99 % O2 Device: O2 Device: Ventilator O2 Flow Rate:    Intake/output summary:   Intake/Output Summary (Last 24 hours) at 10/08/2017 0952 Last data filed at 10/08/2017 0900 Gross per 24 hour  Intake 2684.21 ml  Output 1160  ml  Net 1524.21 ml   LBM: Last BM Date: (PTA on 10/08/2017) Baseline Weight: Weight: 129.5 kg(bed weight) Most recent weight: Weight: 128 kg       Palliative Assessment/Data: Intubated    Flowsheet Rows     Most Recent Value  Intake Tab  Referral Department  Hospitalist  Unit at Time of Referral  ICU  Palliative Care Primary Diagnosis  Cardiac  Date Notified  10/07/17  Palliative Care Type  New Palliative care  Reason for referral  Clarify Goals of Care  Date of Admission  10/06/17  Date first seen by Palliative Care  10/07/17  # of days Palliative referral response time  0 Day(s)  # of days IP prior to Palliative referral  1  Clinical Assessment  Psychosocial & Spiritual Assessment  Palliative Care Outcomes      Patient Active Problem List   Diagnosis Date Noted  . Cardiac arrest ([place]) 10/07/2017  . Iron deficiency anemia due to chronic blood loss   . Symptomatic anemia 06/17/2017  . Pressure injury of skin 06/17/2017  . Atherosclerosis of native arteries of extremity with intermittent claudication ([place]) 12/25/2016  . Chronic venous insufficiency 12/25/2016  . Lymphedema 10/23/2016  . Hyperlipidemia 10/23/2016  . Chronic diastolic heart failure ([place]) 11/10/2014  . Obstructive sleep apnea 11/10/2014  . CAD (coronary artery disease) 10/11/2014  . COPD (chronic obstructive pulmonary disease) ([place]) 10/11/2014  . Type 2 diabetes mellitus ([place]) 10/11/2014  . HTN (hypertension)  10/11/2014  . GERD (gastroesophageal reflux disease) 10/11/2014  . CKD (chronic kidney disease), stage III ([place]) 10/11/2014    Palliative Care Assessment & Plan    Recommendations/Plan:  Continuing current care.     Code Status:    Code Status Orders  (From admission, onward)         Start     Ordered   10/07/17 0132  Full code  Continuous     10/07/17 0131        Code Status History    Date Active Date Inactive Code Status Order ID Comments User Context   10/07/2017 0031 10/07/2017 0131 Full Code [id number]  Awilda Bill, NP ED   06/17/2017 1856 06/23/2017 2244 Full Code [id number]  Nicholes Mango, MD Inpatient   02/18/2017 1623 02/18/2017 2133 Full Code [id number]  Delana Meyer, Dolores Lory, MD Inpatient   02/11/2017 1138 02/11/2017 1736 Full Code [id number]  Schnier, Dolores Lory, MD Inpatient   10/24/2014 1934 10/31/2014 1837 Full Code [id number]  Fritzi Mandes, MD Inpatient   10/11/2014 2347 10/17/2014 1844 Full Code [id number]  Lance Coon, MD Inpatient       Prognosis:   Unable to determine  Discharge Planning:  To Be Determined  Care plan was discussed with RN  Thank you for allowing the Palliative Medicine Team to assist in the care of this patient.   Total Time 15 min Prolonged Time Billed  no      Greater than 50%  of this time was spent counseling and coordinating care related to the above assessment and plan.  Asencion Gowda, NP  Please contact Palliative Medicine Team phone at [phone] for questions and concerns.

## 2017-10-08 NOTE — Progress Notes (Signed)
Pharmacy Antibiotic Note  Billy Liu is a 84 y.o. male admitted on 10/06/2017 s/p cardiac arrest at facility. Concern for pneumonia for which patient was started on vancomycin and cefepime. Pharmacy has been consulted for cefepime dosing.  Plan: Continue Cefepime 2g IV q12h  Height: 6\' 3"  (190.5 cm) Weight: 282 lb 3 oz (128 kg) IBW/kg (Calculated) : 84.5  Temp (24hrs), Avg:96.8 F (36 C), Min:95.9 F (35.5 C), Max:97.2 F (36.2 C)  Recent Labs  Lab 10/06/17 2016 10/07/17 0606 10/07/17 0806 10/07/17 2016 10/08/17 0423  WBC 9.4 5.7  --   --  6.7  CREATININE 1.98*  --  1.60* 1.46* 1.49*    Estimated Creatinine Clearance: 54.1 mL/min (A) (by C-G formula based on SCr of 1.49 mg/dL (H)).    Allergies  Allergen Reactions  . Penicillins Other (See Comments)    Per MAR Has patient had a PCN reaction causing immediate rash, facial/tongue/throat swelling, SOB or lightheadedness with hypotension: Unknown Has patient had a PCN reaction causing severe rash involving mucus membranes or skin necrosis: Unknown Has patient had a PCN reaction that required hospitalization: Unknown Has patient had a PCN reaction occurring within the last 10 years: Unknown If all of the above answers are "NO", then may proceed with Cephalosporin use.     Antimicrobials this admission: Vancomycin 10/1 x 2 doses Cefepime 9/30 >>  Dose adjustments this admission: N/A  Microbiology results: 9/30 BCx: NG 2 days 10/1 UCx: NG   10/1 Wound (left toe): GPC, GNR, GPR  10/1 Wound (right leg): GPC, GNR, GPR  10/1 MRSA PCR: negative  Thank you for allowing pharmacy to be a part of this patient's care.  Broadus John, PharmD Candidate 10/08/2017 12:56 PM

## 2017-10-08 NOTE — Progress Notes (Signed)
*  PRELIMINARY RESULTS* Echocardiogram 2D Echocardiogram has been performed.  Billy Liu [name] 10/08/2017, 11:15 AM

## 2017-10-09 ENCOUNTER — Inpatient Hospital Stay: Payer: Medicare Other

## 2017-10-09 LAB — BASIC METABOLIC PANEL
Anion gap: 8 (ref 5–15)
BUN: 48 mg/dL — AB (ref 8–23)
CO2: 26 mmol/L (ref 22–32)
Calcium: 8 mg/dL — ABNORMAL LOW (ref 8.9–10.3)
Chloride: 106 mmol/L (ref 98–111)
Creatinine, Ser: 1.26 mg/dL — ABNORMAL HIGH (ref 0.61–1.24)
GFR calc Af Amer: 59 mL/min — ABNORMAL LOW (ref >60)
GFR calc non Af Amer: 51 mL/min — ABNORMAL LOW (ref >60)
GLUCOSE: 184 mg/dL — AB (ref 70–99)
Potassium: 4.8 mmol/L (ref 3.5–5.1)
Sodium: 140 mmol/L (ref 135–145)

## 2017-10-09 LAB — TYPE AND SCREEN
ABO/RH(D): AB POS
ANTIBODY SCREEN: NEGATIVE
Unit division: 0
Unit division: 0
Unit division: 0

## 2017-10-09 LAB — BPAM RBC
BLOOD PRODUCT EXPIRATION DATE: 201910212359
Blood Product Expiration Date: 201910232359
Blood Product Expiration Date: 201910232359
ISSUE DATE / TIME: 201910020737
ISSUE DATE / TIME: 201910010141
ISSUE DATE / TIME: 201910010322
UNIT TYPE AND RH: 5100
Unit Type and Rh: 5100
Unit Type and Rh: 6200

## 2017-10-09 LAB — CBC
HCT: 24.1 % — ABNORMAL LOW (ref 40.0–52.0)
Hemoglobin: 8.1 g/dL — ABNORMAL LOW (ref 13.0–18.0)
MCH: 28.5 pg (ref 26.0–34.0)
MCHC: 33.6 g/dL (ref 32.0–36.0)
MCV: 84.9 fL (ref 80.0–100.0)
Platelets: 231 10*3/uL (ref 150–440)
RBC: 2.84 MIL/uL — ABNORMAL LOW (ref 4.40–5.90)
RDW: 17.8 % — AB (ref 11.5–14.5)
WBC: 6.5 10*3/uL (ref 3.8–10.6)

## 2017-10-09 LAB — GLUCOSE, CAPILLARY
GLUCOSE-CAPILLARY: 209 mg/dL — AB (ref 70–99)
Glucose-Capillary: 168 mg/dL — ABNORMAL HIGH (ref 70–99)
Glucose-Capillary: 170 mg/dL — ABNORMAL HIGH (ref 70–99)
Glucose-Capillary: 174 mg/dL — ABNORMAL HIGH (ref 70–99)
Glucose-Capillary: 229 mg/dL — ABNORMAL HIGH (ref 70–99)

## 2017-10-09 MED ORDER — ALPRAZOLAM 0.5 MG PO TABS
0.5000 mg | ORAL_TABLET | Freq: Three times a day (TID) | ORAL | Status: DC
  Administered 2017-10-09 – 2017-10-22 (×41): 0.5 mg via ORAL
  Filled 2017-10-09 (×41): qty 1

## 2017-10-09 NOTE — Progress Notes (Signed)
Patient ID: Billy Liu, male   DOB: Apr 05, 1932, 84 y.o.   MRN: [id number]  [employer] PROGRESS NOTE  Kamarion Zagami PRA:[id number] DOB: 01/03/1933 DOA: 10/06/2017 PCP: Alvester Morin, MD  HPI/Subjective: As per nursing staff the patient was thrashing around once they got off the sedation.  Objective: Vitals:   10/09/17 1215 10/09/17 1500  BP:    Pulse:    Resp:    Temp:    SpO2: 98% 97%    Filed Weights   10/07/17 0200 10/08/17 0500 10/09/17 0500  Weight: 125.8 kg 128 kg 132.1 kg    ROS: Review of Systems  Unable to perform ROS: Acuity of condition   Exam: Physical Exam  Constitutional: He appears lethargic. He is intubated.  HENT:  Nose: Mucosal edema present.  Unable to look into mouth  Eyes: Conjunctivae and lids are normal.  Pupils pinpoint  Neck: Carotid bruit is not present.  Cardiovascular: Regular rhythm, S1 normal, S2 normal and normal heart sounds.  Respiratory: He is intubated. He has decreased breath sounds in the right lower field and the left lower field. He has no wheezes. He has rhonchi in the right lower field and the left lower field.  GI: Bowel sounds are normal. There is no tenderness.  Musculoskeletal:       Right ankle: He exhibits swelling.       Left ankle: He exhibits swelling.  Neurological: He appears lethargic.  Opens eyes and moves arms with stimulation  Skin: Skin is warm.  Right posterior calf large area of greenish discoloration and foul-smelling in an ulcer.  Left foot first toe with small ulceration and very swollen first toe.  Likely from chronic infection  Psychiatric:  Opens eyes and moves his arms to stimulation      Data Reviewed: Basic Metabolic Panel: Recent Labs  Lab 10/06/17 2016 10/07/17 0127 10/07/17 0409 10/07/17 0806 10/07/17 2016 10/08/17 0423 10/09/17 0331  NA 132*  --   --  136 137 135 140  K 6.3*  --  4.7 4.4 4.6 4.6 4.8  CL 98  --   --  101 105 103 106  CO2 22  --   --  _0 GLUCOSE 210*  --   --  69* 98 122* 184*  BUN 62*  --   --  59* 51* 51* 48*  CREATININE 1.98*  --   --  1.60* 1.46* 1.49* 1.26*  CALCIUM 8.4*  --   --  8.0* 7.9* 7.8* 8.0*  MG  --  2.8*  --   --   --  2.7*  --   PHOS  --  5.0*  --   --   --  4.4  --    Liver Function Tests: Recent Labs  Lab 10/06/17 2016  AST 51*  ALT 25  ALKPHOS 91  BILITOT 0.5  PROT 7.0  ALBUMIN 3.5   CBC: Recent Labs  Lab 10/06/17 2016 10/07/17 0606 10/08/17 0423 10/08/17 1120 10/09/17 0331  WBC 9.4 5.7 6.7  --  6.5  NEUTROABS 6.3 4.5  --   --   --   HGB 6.3* 6.5* 6.9* 8.3* 8.1*  HCT 20.4* 19.7* 20.7* 24.9* 24.1*  MCV 85.6 82.5 83.7  --  84.9  PLT 333 224 238  --  231   Cardiac Enzymes: Recent Labs  Lab 10/06/17 2016 10/07/17 0127 10/07/17 0806 10/07/17 1436 10/07/17 2016  TROPONINI <0.03 <0.03 <0.03 <0.03 <0.03    CBG: Recent  Labs  Lab 10/08/17 2335 10/09/17 0433 10/09/17 0709 10/09/17 1220 10/09/17 1618  GLUCAP 167* 168* 170* 174* 209*    Recent Results (from the past 240 hour(s))  Blood culture (routine x 2)     Status: None (Preliminary result)   Collection Time: 10/06/17 10:43 PM  Result Value Ref Range Status   Specimen Description BLOOD LEFT HAND   Final   Special Requests   Final    BOTTLES DRAWN AEROBIC AND ANAEROBIC Blood Culture adequate volume   Culture   Final    NO GROWTH 3 DAYS Performed at [facility], [address], [place], [place] [address]    Report Status PENDING  Incomplete  Blood culture (routine x 2)     Status: None (Preliminary result)   Collection Time: 10/06/17 10:44 PM  Result Value Ref Range Status   Specimen Description BLOOD BLOOD LEFT FOREARM  Final   Special Requests   Final    BOTTLES DRAWN AEROBIC AND ANAEROBIC Blood Culture adequate volume   Culture   Final    NO GROWTH 3 DAYS Performed at [facility], [address], [place], [place] [address]    Report Status PENDING  Incomplete  MRSA PCR Screening      Status: None   Collection Time: 10/07/17  1:07 AM  Result Value Ref Range Status   MRSA by PCR NEGATIVE NEGATIVE Final    Comment:        The GeneXpert MRSA Assay (FDA approved for NASAL specimens only), is one component of a comprehensive MRSA colonization surveillance program. It is not intended to diagnose MRSA infection nor to guide or monitor treatment for MRSA infections. Performed at [facility], [address], [place], [place] [address]   Urine Culture     Status: None   Collection Time: 10/07/17  2:24 AM  Result Value Ref Range Status   Specimen Description   Final    URINE, RANDOM Performed at [facility], [address], [place], [place] [address]    Special Requests   Final    NONE Performed at [facility], [address], [place], [place] [address]    Culture   Final    NO GROWTH Performed at [facility], [place] [address], [place], [place] [address]    Report Status 10/08/2017 FINAL  Final  Aerobic/Anaerobic Culture (surgical/deep wound)     Status: Abnormal (Preliminary result)   Collection Time: 10/07/17  2:24 AM  Result Value Ref Range Status   Specimen Description TOE LEFT  Final   Special Requests PENDING  Incomplete   Gram Stain   Final    RARE SQUAMOUS EPITHELIAL CELLS PRESENT NO WBC SEEN MODERATE GRAM POSITIVE RODS FEW GRAM POSITIVE COCCI RARE GRAM NEGATIVE RODS Performed at [facility], [address], [place], [place] [address]    Culture (A)  Final    MULTIPLE ORGANISMS PRESENT, NONE PREDOMINANT NO ANAEROBES ISOLATED; CULTURE IN PROGRESS FOR 5 DAYS    Report Status PENDING  Incomplete  Aerobic/Anaerobic Culture (surgical/deep wound)     Status: Abnormal (Preliminary result)   Collection Time: 10/07/17  2:24 AM  Result Value Ref Range Status   Specimen Description   Final    LEG RIGHT Performed at [facility], [place] [address] [place],  [address]    Special Requests   Final     NONE Performed at [facility], [place], [place]  [address]    Gram Stain   Final    NO WBC SEEN MODERATE GRAM NEGATIVE RODS FEW GRAM POSITIVE RODS RARE GRAM POSITIVE COCCI Performed at [facility], [place] [address] [place], [place] [address]    Culture (A)  Final    MULTIPLE ORGANISMS PRESENT, NONE PREDOMINANT NO ANAEROBES ISOLATED; CULTURE IN PROGRESS FOR 5 DAYS    Report Status PENDING  Incomplete     Studies: Ct Head Wo Contrast  Result Date: 10/09/2017 CLINICAL DATA:  Question anoxic brain injury.  Cardiac arrest EXAM: CT HEAD WITHOUT CONTRAST TECHNIQUE: Contiguous axial images were obtained from the base of the skull through the vertex without intravenous contrast. 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 Small remote lateral left frontal infarct best seen on reformats. There is generalized atrophy. Small remote left cerebellar infarct. No detected acute infarct or swelling. No hemorrhage, hydrocephalus, or collection. Vascular: Atherosclerotic calcification.  No hyperdense vessel. Skull: Remote left frontal craniotomy.  No acute finding Sinuses/Orbits: No acute finding IMPRESSION: 1. No evident anoxic injury. 2. 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 Electronically Signed   By: Monte Fantasia M.D.   On: 10/09/2017 14:46   Dg Chest Port 1 View  Result Date: 10/09/2017 CLINICAL DATA:  Acute respiratory failure. EXAM: PORTABLE CHEST 1 VIEW COMPARISON:  Radiograph 10/07/2017 FINDINGS: Endotracheal tube 4.4 cm from the carina. Enteric tube in place with tip and side-port below the diaphragm. Cardiomegaly is unchanged. Worsening hazy opacities throughout both lungs. Bibasilar volume loss again seen with associated effusions. No pneumothorax. IMPRESSION: Worsening lung aeration with increasing hazy opacities throughout both lungs, likely combination of pleural fluid, atelectasis, and pulmonary  edema. Electronically Signed   By: Keith Rake M.D.   On: 10/09/2017 04:06    Scheduled Meds: . ALPRAZolam  0.5 mg Oral TID  . chlorhexidine gluconate (MEDLINE KIT)  15 mL Mouth Rinse BID  . feeding supplement (PRO-STAT SUGAR FREE 64)  60 mL Per Tube QID  . feeding supplement (VITAL HIGH PROTEIN)  1,000 mL Per Tube Q24H  . heparin  5,000 Units Subcutaneous Q8H  . Influenza vac split quadrivalent PF  0.5 mL Intramuscular Tomorrow-1000  . insulin aspart  2-6 Units Subcutaneous Q4H  . mouth rinse  15 mL Mouth Rinse 10 times per day  . multivitamin  15 mL Per Tube Daily  . polyethylene glycol  17 g Per Tube Daily  . senna-docusate  2 tablet Per Tube BID   Continuous Infusions: . sodium chloride 50 mL/hr at 10/09/17 1100  . famotidine (PEPCID) IV 20 mg (10/09/17 1236)  . fentaNYL infusion INTRAVENOUS 150 mcg/hr (10/09/17 1100)  . norepinephrine (LEVOPHED) Adult infusion Stopped (10/09/17 0722)  . propofol (DIPRIVAN) infusion 40 mcg/kg/min (10/09/17 1554)  . valproate sodium 250 mg (10/09/17 1418)    Assessment/Plan:  1. Cardiac arrest.  Patient wason hypothermia protocol as per critical care team and is undergoing rewarming.  Continue supportive care.  Patient was thrashing around when and sedated and needed to be re-sedated.  Repeat CT scan of the head did not show any signs of anoxic encephalopathy at this point.  Placed on empiric Depacon. 2. Acute hypoxic respiratory failure.  Continue ventilator support.  When I saw him he was on 30% FiO2. 3. Left lower lobe pneumonia, right lower extremity ulceration with foul-smelling discharge, left first toe ulceration and swelling of the first toe likely chronic ulceration on aggressive antibiotics. 4. Hypotension.  Off pressors  5. Acute kidney injury and hyperkalemia.  Creatinine has improved down to 1.26 and potassium is 4.8 6. History of chronic diastolic congestive heart failure 7. Overall prognosis poor and high risk for  cardiopulmonary arrest.  Patient listed as full code. 8. Anemia.  Responded to transfusion.  Code Status:     Code Status Orders  (From admission, onward)         Start     Ordered   10/07/17 0132  Full code  Continuous     10/07/17 0131        Code Status History    Date Active Date Inactive Code Status Order ID Comments User Context   10/07/2017 0031 10/07/2017 0131 Full Code [id number]  Awilda Bill, NP ED   06/17/2017 1856 06/23/2017 2244 Full Code [id number]  Nicholes Mango, MD Inpatient   02/18/2017 1623 02/18/2017 2133 Full Code [id number]  Delana Meyer, Dolores Lory, MD Inpatient   02/11/2017 1138 02/11/2017 1736 Full Code [id number]  Schnier, Dolores Lory, MD Inpatient   10/24/2014 1934 10/31/2014 1837 Full Code [id number]  Fritzi Mandes, MD Inpatient   10/11/2014 2347 10/17/2014 1844 Full Code [id number]  Lance Coon, MD Inpatient     Family Communication: As per critical care team Disposition Plan: To be determined  Consultants:  Critical care specialist  Antibiotics:  Cefepime  Time spent: 24 minutes.  Case discussed with critical care specialist.  [facility]

## 2017-10-09 NOTE — Progress Notes (Signed)
SUBJECTIVE: Status post cardiac arrest patient remains intubated and unresponsive.   Vitals:   10/09/17 0715 10/09/17 0800 10/09/17 0900 10/09/17 0915  BP:  (!) 142/49 (!) 144/47 (!) 141/46  Pulse:  70 70 69  Resp:  16 16 16   Temp:  98.6 F (37 C) 98.6 F (37 C)   TempSrc:  Rectal Rectal   SpO2: 97% 96% 96% 97%  Weight:      Height:        Intake/Output Summary (Last 24 hours) at 10/09/2017 0936 Last data filed at 10/09/2017 0900 Gross per 24 hour  Intake 3643.5 ml  Output 1525 ml  Net 2118.5 ml    LABS: Basic Metabolic Panel: Recent Labs    10/07/17 0127  10/08/17 0423 10/09/17 0331  NA  --    < > 135 140  K  --    < > 4.6 4.8  CL  --    < > 103 106  CO2  --    < > 26 26  GLUCOSE  --    < > 122* 184*  BUN  --    < > 51* 48*  CREATININE  --    < > 1.49* 1.26*  CALCIUM  --    < > 7.8* 8.0*  MG 2.8*  --  2.7*  --   PHOS 5.0*  --  4.4  --    < > = values in this interval not displayed.   Liver Function Tests: Recent Labs    10/06/17 2016  AST 51*  ALT 25  ALKPHOS 91  BILITOT 0.5  PROT 7.0  ALBUMIN 3.5   No results for input(s): LIPASE, AMYLASE in the last 72 hours. CBC: Recent Labs    10/06/17 2016 10/07/17 0606 10/08/17 0423 10/08/17 1120 10/09/17 0331  WBC 9.4 5.7 6.7  --  6.5  NEUTROABS 6.3 4.5  --   --   --   HGB 6.3* 6.5* 6.9* 8.3* 8.1*  HCT 20.4* 19.7* 20.7* 24.9* 24.1*  MCV 85.6 82.5 83.7  --  84.9  PLT 333 224 238  --  231   Cardiac Enzymes: Recent Labs    10/07/17 0806 10/07/17 1436 10/07/17 2016  TROPONINI <0.03 <0.03 <0.03   BNP: Invalid input(s): POCBNP D-Dimer: No results for input(s): DDIMER in the last 72 hours. Hemoglobin A1C: No results for input(s): HGBA1C in the last 72 hours. Fasting Lipid Panel: No results for input(s): CHOL, HDL, LDLCALC, TRIG, CHOLHDL, LDLDIRECT in the last 72 hours. Thyroid Function Tests: No results for input(s): TSH, T4TOTAL, T3FREE, THYROIDAB in the last 72 hours.  Invalid input(s):  FREET3 Anemia Panel: No results for input(s): VITAMINB12, FOLATE, FERRITIN, TIBC, IRON, RETICCTPCT in the last 72 hours.   PHYSICAL EXAM General: Well developed, well nourished, in no acute distress HEENT:  Normocephalic and atramatic Neck:  No JVD.  Lungs: Clear bilaterally to auscultation and percussion. Heart: HRRR . Normal S1 and S2 without gallops or murmurs.  Abdomen: Bowel sounds are positive, abdomen soft and non-tender  Msk:  Back normal, normal gait. Normal strength and tone for age. Extremities: No clubbing, cyanosis or edema.   Neuro: Alert and oriented X 3. Psych:  Good affect, responds appropriately  TELEMETRY: Sinus rhythm  ASSESSMENT AND PLAN: Status post cardiac arrest with respiratory failure and left lower lobe pneumonia and acute hypoxic respiratory failure.  Patient remains unresponsive and intubated.  Echocardiogram was unremarkable.  Active Problems:   Cardiac arrest (HCC)    Eunice Oldaker  A, MD, [facility] 10/09/2017 9:36 AM

## 2017-10-09 NOTE — Progress Notes (Signed)
CRITICAL CARE NOTE  CC  follow up respiratory failure  SUBJECTIVE Patient remains critically ill Prognosis is guarded Remains on vent No family at bedside   SIGNIFICANT EVENTS 10/1 admitted to ICU 10/1 hypothermia protocol 10/2 remains on vent cooling phase    BP (!) 141/46   Pulse 69   Temp 98.6 F (37 C) (Rectal)   Resp 16   Ht _0  (1.905 m)   Wt 132.1 kg   SpO2 97%   BMI 36.40 kg/m    REVIEW OF SYSTEMS  PATIENT IS UNABLE TO PROVIDE COMPLETE REVIEW OF SYSTEMS DUE TO SEVERE CRITICAL ILLNESS   PHYSICAL EXAMINATION:  GENERAL:critically ill appearing, +resp distress HEAD: Normocephalic, atraumatic.  EYES: Pupils equal, round, reactive to light.  No scleral icterus.  MOUTH: Moist mucosal membrane. NECK: Supple. No thyromegaly. No nodules. No JVD.  PULMONARY: +rhonchi, +wheezing CARDIOVASCULAR: S1 and S2. Regular rate and rhythm. No murmurs, rubs, or gallops.  GASTROINTESTINAL: Soft, nontender, -distended. No masses. Positive bowel sounds. No hepatosplenomegaly.  MUSCULOSKELETAL: No swelling, clubbing, or edema.  NEUROLOGIC: obtunded, GCS<8 SKIN:intact,warm,dry      Indwelling Urinary Catheter continued, requirement due to   Reason to continue Indwelling Urinary Catheter for strict Intake/Output monitoring for hemodynamic instability         Ventilator continued, requirement due to, resp failure    Ventilator Sedation RASS 0 to -2     ASSESSMENT AND PLAN SYNOPSIS 84 yo with acute cardiac arrest sudden cardiac death and severe resp failure on vent support and hypothermia protocol  Severe Hypoxic and Hypercapnic Respiratory Failure -continue Full MV support -continue Bronchodilator Therapy -Wean Fio2 and PEEP as tolerated -will perform SAT/SBT when respiratory parameters are met   CARDIAC FAILURE- -oxygen as needed -Lasix as tolerated -follow up cardiac enzymes as indicated   Renal Failure-most likely due to ATN -follow chem 7 -follow  UO -continue Foley Catheter-assess need daily   NEUROLOGY - intubated and sedated - minimal sedation to achieve a RASS goal: -1 Wake up assessment pending   CARDIAC ICU monitoring   GI/Nutrition GI PROPHYLAXIS as indicated DIET-->TF's as tolerated Constipation protocol as indicated  ENDO - ICU hypoglycemic\Hyperglycemia protocol -check FSBS per protocol   ELECTROLYTES -follow labs as needed -replace as needed -pharmacy consultation and following   DVT/GI PRX ordered TRANSFUSIONS AS NEEDED MONITOR FSBS ASSESS the need for LABS as needed   Critical Care Time devoted to patient care services described in this note is 38 minutes.   Overall, patient is critically ill, prognosis is guarded.  Patient with Multiorgan failure and at high risk for cardiac arrest and death.   Will update family when available  Corrin Parker, M.D.  [facility]  Medical Director [place] Director [facility] Cardio-Pulmonary Department

## 2017-10-09 NOTE — Progress Notes (Addendum)
Daily Progress Note   Patient Name: Billy Liu       Date: 10/09/2017 DOB: [birth year]  Age: 84 y.o. MRN#: [id number] Attending Physician: Loletha Grayer, MD Primary Care Physician: Alvester Morin, MD Admit Date: 10/06/2017  Reason for Consultation/Follow-up: Psychosocial/spiritual support  Subjective: Patient is resting in bed on ventilator. Both brothers are at bedside. Per staff, sedation was held this morning and patient moved extremities, but sedation had to be restarted.  Spoke with brothers regarding goals of care. We discussed quality of life.  At this time, they would like to continue with current course.   The brothers state they would want to try CPR once more in the case of cardiac arrest. They do not want a tracheostomy. They do not want a permanent feeding tube as this would not be an acceptable quality of life. At the point of maximized care with discontinued sedation and extubation, if he does not improve, discussed shift to comfort care.  Length of Stay: 2  Current Medications: Scheduled Meds:  . ALPRAZolam  0.5 mg Oral TID  . chlorhexidine gluconate (MEDLINE KIT)  15 mL Mouth Rinse BID  . feeding supplement (PRO-STAT SUGAR FREE 64)  60 mL Per Tube QID  . feeding supplement (VITAL HIGH PROTEIN)  1,000 mL Per Tube Q24H  . heparin  5,000 Units Subcutaneous Q8H  . Influenza vac split quadrivalent PF  0.5 mL Intramuscular Tomorrow-1000  . insulin aspart  2-6 Units Subcutaneous Q4H  . mouth rinse  15 mL Mouth Rinse 10 times per day  . multivitamin  15 mL Per Tube Daily  . polyethylene glycol  17 g Per Tube Daily  . senna-docusate  2 tablet Per Tube BID    Continuous Infusions: . sodium chloride 50 mL/hr at 10/09/17 1100  . famotidine (PEPCID) IV 20 mg  (10/09/17 1236)  . fentaNYL infusion INTRAVENOUS 150 mcg/hr (10/09/17 1100)  . norepinephrine (LEVOPHED) Adult infusion Stopped (10/09/17 0722)  . propofol (DIPRIVAN) infusion 40 mcg/kg/min (10/09/17 1213)  . valproate sodium Stopped (10/09/17 0525)    PRN Meds: acetaminophen **OR** acetaminophen, atropine, bisacodyl, fentaNYL, HYDROcodone-acetaminophen, ondansetron **OR** ondansetron (ZOFRAN) IV, traZODone  Physical Exam  Constitutional: No distress.  Pulmonary/Chest:  Intubated            Vital Signs: BP (!) 149/44   Pulse 84  Temp 99 F (37.2 C) (Rectal)   Resp 16   Ht '6\' 3"'  (1.905 m)   Wt 132.1 kg   SpO2 98%   BMI 36.40 kg/m  SpO2: SpO2: 98 % O2 Device: O2 Device: Ventilator O2 Flow Rate:    Intake/output summary:   Intake/Output Summary (Last 24 hours) at 10/09/2017 1339 Last data filed at 10/09/2017 1100 Gross per 24 hour  Intake 2764.37 ml  Output 1300 ml  Net 1464.37 ml   LBM: Last BM Date: (prior to admit on 10/2) Baseline Weight: Weight: 129.5 kg(bed weight) Most recent weight: Weight: 132.1 kg       Palliative Assessment/Data: Intubated    Flowsheet Rows     Most Recent Value  Intake Tab  Referral Department  Hospitalist  Unit at Time of Referral  ICU  Palliative Care Primary Diagnosis  Cardiac  Date Notified  10/07/17  Palliative Care Type  New Palliative care  Reason for referral  Clarify Goals of Care  Date of Admission  10/06/17  Date first seen by Palliative Care  10/07/17  # of days Palliative referral response time  0 Day(s)  # of days IP prior to Palliative referral  1  Clinical Assessment  Psychosocial & Spiritual Assessment  Palliative Care Outcomes      Patient Active Problem List   Diagnosis Date Noted  . Cardiac arrest ([place]) 10/07/2017  . Iron deficiency anemia due to chronic blood loss   . Symptomatic anemia 06/17/2017  . Pressure injury of skin 06/17/2017  . Atherosclerosis of native arteries of extremity with  intermittent claudication ([place]) 12/25/2016  . Chronic venous insufficiency 12/25/2016  . Lymphedema 10/23/2016  . Hyperlipidemia 10/23/2016  . Chronic diastolic heart failure ([place]) 11/10/2014  . Obstructive sleep apnea 11/10/2014  . CAD (coronary artery disease) 10/11/2014  . COPD (chronic obstructive pulmonary disease) ([place]) 10/11/2014  . Type 2 diabetes mellitus ([place]) 10/11/2014  . HTN (hypertension) 10/11/2014  . GERD (gastroesophageal reflux disease) 10/11/2014  . CKD (chronic kidney disease), stage III ([place]) 10/11/2014    Palliative Care Assessment & Plan    Recommendations/Plan:  Continuing current care.     Code Status:    Code Status Orders  (From admission, onward)         Start     Ordered   10/07/17 0132  Full code  Continuous     10/07/17 0131        Code Status History    Date Active Date Inactive Code Status Order ID Comments User Context   10/07/2017 0031 10/07/2017 0131 Full Code [id number]  Awilda Bill, NP ED   06/17/2017 1856 06/23/2017 2244 Full Code [id number]  Nicholes Mango, MD Inpatient   02/18/2017 1623 02/18/2017 2133 Full Code [id number]  Delana Meyer, Dolores Lory, MD Inpatient   02/11/2017 1138 02/11/2017 1736 Full Code [id number]  Schnier, Dolores Lory, MD Inpatient   10/24/2014 1934 10/31/2014 1837 Full Code [id number]  Fritzi Mandes, MD Inpatient   10/11/2014 2347 10/17/2014 1844 Full Code [id number]  Lance Coon, MD Inpatient       Prognosis:   Unable to determine  Discharge Planning:  To Be Determined  Care plan was discussed with RN  Thank you for allowing the Palliative Medicine Team to assist in the care of this patient.   Total Time 35 min Prolonged Time Billed  no      Greater than 50%  of this time was spent counseling and coordinating care related to the above assessment and  plan.  Asencion Gowda, NP  Please contact Palliative Medicine Team phone at [phone] for questions and concerns.

## 2017-10-09 NOTE — Progress Notes (Signed)
Pharmacy ICU Monitoring Consult:  Pharmacy consulted to assist in monitoring and replacing electrolytes in this 84 y.o. male admitted on 10/06/2017 with Cardiac Arrest   Labs:  Sodium (mmol/L)  Date Value  10/09/2017 140  07/04/2013 133 (L)   Potassium (mmol/L)  Date Value  10/09/2017 4.8  07/04/2013 4.3   Magnesium (mg/dL)  Date Value  16/10/9602 2.7 (H)   Phosphorus (mg/dL)  Date Value  54/09/8117 4.4   Calcium (mg/dL)  Date Value  14/78/2956 8.0 (L)   Calcium, Total (mg/dL)  Date Value  21/30/8657 8.7   Albumin (g/dL)  Date Value  84/69/6295 3.5  07/04/2013 3.6    Assessment/Plan: 1. Electrolytes: no replacement warranted. Pharmacy will not order morning labs.  2. Glucose mangagment: patient on sliding scale insulin. Will continue to monitor.   3. Constipation Management: daily Miralax and Senokot BID. Will continue to monitor and add as appropriate.   Pharmacy will continue to monitor and adjust per consult.  Broadus John, PharmD Candidate 10/09/2017 11:24 AM

## 2017-10-09 NOTE — Consult Note (Signed)
Reason for Consult: Anoxic brain injury Referring Physician:   CC: Amelia Jo, MD  HPI: Billy Liu is an 84 y.o. male with significant past medical history as below presenting to the ED from [facility] status post cardiac arrest. Patient is unable to provide history due to current mental status and family not available at this time therefore all history is obtained from patient's chart.  Per ED reports, patient was brought in on 9/30 from [facility] after being found unresponsive on the bathroom floor by staff.  Unclear how long patient had been down.  They were unable to obtain a pulse so CPR was initiated up to several rounds before ROSC.  When EMS arrived patient already had a barely palpable pulse, was also hypoxic with agonal respirations requiring bag mask ventilation.  Upon arrival to the ED, rapid sequence intubation was performed to secure airway and cooling protocol initiated.  Initial CT head and cervical spine was negative for acute abnormality, EKG showed sinus bradycardia, chest x-ray showed evidence of pneumonia and pulmonary edema.  Initial labs was abnormal showing potassium of 6.3, hemoglobin of 6.3 and elevated troponins.  He received 2 units of prbcs and was started on empiric IV antibiotics.  He was transferred to the ICU for further work-up and management.  Past Medical History:  Diagnosis Date  . Anxiety   . Cellulitis 10/17/2014   left lower leg  . Chronic diastolic CHF (congestive heart failure) ([place])   . CKD (chronic kidney disease), stage III ([place])   . COPD (chronic obstructive pulmonary disease) ([place])   . Coronary artery disease   . Dementia ([place])    Alzheimer's   . Depression   . Diabetes mellitus without complication ([place])   . GERD (gastroesophageal reflux disease)   . Hyperlipemia   . Hypertension   . Obesity   . Renal disorder     Past Surgical History:  Procedure Laterality Date  . COLONOSCOPY N/A 06/20/2017   Procedure: COLONOSCOPY;   Surgeon: Lin Landsman, MD;  Location[facility] ENDOSCOPY;  Service: Gastroenterology;  Laterality: N/A;  . ESOPHAGOGASTRODUODENOSCOPY N/A 06/20/2017   Procedure: ESOPHAGOGASTRODUODENOSCOPY (EGD);  Surgeon: Lin Landsman, MD;  Location: [facility] ENDOSCOPY;  Service: Gastroenterology;  Laterality: N/A;  . LOWER EXTREMITY ANGIOGRAPHY Left 02/11/2017   Procedure: LOWER EXTREMITY ANGIOGRAPHY;  Surgeon: Katha Cabal, MD;  Location: [place] CV LAB;  Service: Cardiovascular;  Laterality: Left;  . LOWER EXTREMITY ANGIOGRAPHY Left 03/11/2017   Procedure: LOWER EXTREMITY ANGIOGRAPHY;  Surgeon: Katha Cabal, MD;  Location: [place] CV LAB;  Service: Cardiovascular;  Laterality: Left;  . LOWER EXTREMITY INTERVENTION  03/11/2017   Procedure: LOWER EXTREMITY INTERVENTION;  Surgeon: Katha Cabal, MD;  Location: [place] CV LAB;  Service: Cardiovascular;;  . PERIPHERAL VASCULAR ATHERECTOMY Left 02/18/2017   Procedure: PERIPHERAL VASCULAR ATHERECTOMY;  Surgeon: Katha Cabal, MD;  Location: [place] CV LAB;  Service: Cardiovascular;  Laterality: Left;    Family History  Problem Relation Age of Onset  . Cancer Brother     Social History:  reports that he has never smoked. He has quit using smokeless tobacco.  His smokeless tobacco use included chew. He reports that he does not drink alcohol or use drugs.  Allergies  Allergen Reactions  . Penicillins Other (See Comments)    Per MAR Has patient had a PCN reaction causing immediate rash, facial/tongue/throat swelling, SOB or lightheadedness with hypotension: Unknown Has patient had a PCN reaction causing severe rash  involving mucus membranes or skin necrosis: Unknown Has patient had a PCN reaction that required hospitalization: Unknown Has patient had a PCN reaction occurring within the last 10 years: Unknown If all of the above answers are "NO", then may proceed with Cephalosporin use.     Medications:  I have  reviewed the patient's current medications. Prior to Admission:  Medications Prior to Admission  Medication Sig Dispense Refill Last Dose  . acetaminophen (TYLENOL) 500 MG tablet Take 500 mg by mouth 3 (three) times daily.   10/06/2017 at 1709  . amLODipine (NORVASC) 10 MG tablet Take 10 mg by mouth daily.   10/06/2017 at 0953  . aspirin EC 81 MG tablet Take 81 mg by mouth daily.   10/06/2017 at 0953  . Cholecalciferol (VITAMIN D) 2000 units tablet Take 2,000 Units by mouth daily.   10/06/2017 at 0953  . cloNIDine (CATAPRES) 0.2 MG tablet Take 1 tablet (0.2 mg total) by mouth 3 (three) times daily. 60 tablet 11 10/06/2017 at 1320  . divalproex (DEPAKOTE) 250 MG DR tablet Take 250 mg by mouth daily.    10/06/2017 at 0930  . divalproex (DEPAKOTE) 500 MG DR tablet Take 500 mg by mouth at bedtime.   10/05/2017 at 2226  . ferrous sulfate 324 (65 Fe) MG TBEC Take 324 mg by mouth daily.   10/06/2017 at 0953  . finasteride (PROSCAR) 5 MG tablet Take 1 tablet (5 mg total) by mouth daily.   10/06/2017 at 0953  . glucagon, human recombinant, (GLUCAGEN DIAGNOSTIC) 1 MG injection Inject 1 mg into the muscle once as needed for low blood sugar.   07/07/2017 at 1222  . hydrALAZINE (APRESOLINE) 100 MG tablet Take 100 mg by mouth 3 (three) times daily.   10/06/2017 at 1320  . insulin detemir (LEVEMIR) 100 UNIT/ML injection Inject 40 Units into the skin 2 (two) times daily.    10/06/2017 at 0953  . metoprolol succinate (TOPROL-XL) 100 MG 24 hr tablet Take 300 mg by mouth daily.    10/06/2017 at 0953  . pantoprazole (PROTONIX) 40 MG tablet Take 1 tablet (40 mg total) by mouth daily. 30 tablet 0 10/06/2017 at 0606  . polyethylene glycol (MIRALAX / GLYCOLAX) packet Take 17 g by mouth daily.    10/06/2017 at 0953  . senna (SENOKOT) 8.6 MG TABS tablet Take 2 tablets by mouth at bedtime.   10/05/2017 at 2226  . sertraline (ZOLOFT) 25 MG tablet Take 75 mg by mouth daily.    10/06/2017 at 0953  . simvastatin (ZOCOR) 20 MG tablet Take 20 mg  by mouth at bedtime.    10/05/2017 at 2226  . Sunscreens (AVEENO DAILY MOISTURIZER) LOTN Apply topically at bedtime. Apply to bilateral feet and legs   10/05/2017 at 2254  . torsemide (DEMADEX) 20 MG tablet Take 2 tablets (40 mg total) by mouth daily.   10/06/2017 at 0953  . vitamin C (ASCORBIC ACID) 500 MG tablet Take 500 mg by mouth daily.   10/06/2017 at 0953  . omeprazole (PRILOSEC) 40 MG capsule Take 1 capsule (40 mg total) by mouth 2 (two) times daily for 14 days. 28 capsule 0    Scheduled: . ALPRAZolam  0.5 mg Oral TID  . chlorhexidine gluconate (MEDLINE KIT)  15 mL Mouth Rinse BID  . feeding supplement (PRO-STAT SUGAR FREE 64)  60 mL Per Tube QID  . feeding supplement (VITAL HIGH PROTEIN)  1,000 mL Per Tube Q24H  . heparin  5,000 Units Subcutaneous Q8H  .  Influenza vac split quadrivalent PF  0.5 mL Intramuscular Tomorrow-1000  . insulin aspart  2-6 Units Subcutaneous Q4H  . mouth rinse  15 mL Mouth Rinse 10 times per day  . multivitamin  15 mL Per Tube Daily  . polyethylene glycol  17 g Per Tube Daily  . senna-docusate  2 tablet Per Tube BID    ROS: Unable to obtain from patient due to current mental status requiring mechanical ventilation and sedation  Physical Examination: Blood pressure (!) 149/44, pulse 84, temperature 99 F (37.2 C), temperature source Rectal, resp. rate 16, height _0  (1.905 m), weight 132.1 kg, SpO2 98 %.  HEENT-  Normocephalic, no lesions, without obvious abnormality.  Normal external eye and conjunctiva.  Normal TM's bilaterally.  Normal auditory canals and external ears. Normal external nose, mucus membranes and septum.  Normal pharynx. Cardiovascular- S1, S2 normal, pulses palpable throughout   Lungs- chest clear, no wheezing, rales, normal symmetric air entry Abdomen- soft, non-tender; bowel sounds normal; no masses,  no organomegaly Extremities- LE edema, scrotal edema Lymph-no adenopathy palpable Musculoskeletal-no joint tenderness, deformity or  swelling Skin-left foot skin lesion  Neurological Exam Mental Status: Patient does not respond to verbal stimuli.  Does not respond to deep sternal rub.  Does not follow commands.  No verbalizations noted.  Cranial Nerves: II: patient does not respond confrontation bilaterally, pupils right 2 mm, left 2 mm,and reactive bilaterally III,IV,VI: doll's response absent bilaterally.  V,VII: corneal reflex reduced on the right  VIII: patient does not respond to verbal stimuli IX,X: gag reflex reduced, XI: trapezius strength unable to test bilaterally XII: tongue strength unable to test Motor: Extremities flaccid throughout.  No spontaneous movement noted.  No purposeful movements noted. Sensory: Does not respond to noxious stimuli in any extremity. Deep Tendon Reflexes:  Absent throughout. Plantars: absent bilaterally Cerebellar: Unable to perform  Data Reviewed  Laboratory Studies:   Basic Metabolic Panel: Recent Labs  Lab 10/06/17 2016 10/07/17 0127 10/07/17 0409 10/07/17 0806 10/07/17 2016 10/08/17 0423 10/09/17 0331  NA 132*  --   --  136 137 135 140  K 6.3*  --  4.7 4.4 4.6 4.6 4.8  CL 98  --   --  101 105 103 106  CO2 22  --   --  _1 GLUCOSE 210*  --   --  69* 98 122* 184*  BUN 62*  --   --  59* 51* 51* 48*  CREATININE 1.98*  --   --  1.60* 1.46* 1.49* 1.26*  CALCIUM 8.4*  --   --  8.0* 7.9* 7.8* 8.0*  MG  --  2.8*  --   --   --  2.7*  --   PHOS  --  5.0*  --   --   --  4.4  --     Liver Function Tests: Recent Labs  Lab 10/06/17 2016  AST 51*  ALT 25  ALKPHOS 91  BILITOT 0.5  PROT 7.0  ALBUMIN 3.5   No results for input(s): LIPASE, AMYLASE in the last 168 hours. No results for input(s): AMMONIA in the last 168 hours.  CBC: Recent Labs  Lab 10/06/17 2016 10/07/17 0606 10/08/17 0423 10/08/17 1120 10/09/17 0331  WBC 9.4 5.7 6.7  --  6.5  NEUTROABS 6.3 4.5  --   --   --   HGB 6.3* 6.5* 6.9* 8.3* 8.1*  HCT 20.4* 19.7* 20.7* 24.9* 24.1*   MCV 85.6 82.5 83.7  --  84.9  PLT 333 224 238  --  231    Cardiac Enzymes: Recent Labs  Lab 10/06/17 2016 10/07/17 0127 10/07/17 0806 10/07/17 1436 10/07/17 2016  TROPONINI <0.03 <0.03 <0.03 <0.03 <0.03    BNP: Invalid input(s): POCBNP  CBG: Recent Labs  Lab 10/08/17 1941 10/08/17 2335 10/09/17 0433 10/09/17 0709 10/09/17 1220  GLUCAP 204* 167* 168* 170* 174*    Microbiology: Results for orders placed or performed during the hospital encounter of 10/06/17  Blood culture (routine x 2)     Status: None (Preliminary result)   Collection Time: 10/06/17 10:43 PM  Result Value Ref Range Status   Specimen Description BLOOD LEFT HAND   Final   Special Requests   Final    BOTTLES DRAWN AEROBIC AND ANAEROBIC Blood Culture adequate volume   Culture   Final    NO GROWTH 3 DAYS Performed at [facility], [address] [place], [place] [address]    Report Status PENDING  Incomplete  Blood culture (routine x 2)     Status: None (Preliminary result)   Collection Time: 10/06/17 10:44 PM  Result Value Ref Range Status   Specimen Description BLOOD BLOOD LEFT FOREARM  Final   Special Requests   Final    BOTTLES DRAWN AEROBIC AND ANAEROBIC Blood Culture adequate volume   Culture   Final    NO GROWTH 3 DAYS Performed at [facility], [address], [place], [place] [address]    Report Status PENDING  Incomplete  MRSA PCR Screening     Status: None   Collection Time: 10/07/17  1:07 AM  Result Value Ref Range Status   MRSA by PCR NEGATIVE NEGATIVE Final    Comment:        The GeneXpert MRSA Assay (FDA approved for NASAL specimens only), is one component of a comprehensive MRSA colonization surveillance program. It is not intended to diagnose MRSA infection nor to guide or monitor treatment for MRSA infections. Performed at [facility], [address] [place], [place] [address]   Urine Culture     Status: None   Collection Time:  10/07/17  2:24 AM  Result Value Ref Range Status   Specimen Description   Final    URINE, RANDOM Performed at [facility], [address] [place], [place] [address]    Special Requests   Final    NONE Performed at [facility], [address], [place], [place] [address]    Culture   Final    NO GROWTH Performed at [facility], [place] [address], [place], [place] [address]    Report Status 10/08/2017 FINAL  Final  Aerobic/Anaerobic Culture (surgical/deep wound)     Status: Abnormal (Preliminary result)   Collection Time: 10/07/17  2:24 AM  Result Value Ref Range Status   Specimen Description TOE LEFT  Final   Special Requests PENDING  Incomplete   Gram Stain   Final    RARE SQUAMOUS EPITHELIAL CELLS PRESENT NO WBC SEEN MODERATE GRAM POSITIVE RODS FEW GRAM POSITIVE COCCI RARE GRAM NEGATIVE RODS Performed at [facility], [address], [place], [place] [address]    Culture MULTIPLE ORGANISMS PRESENT, NONE PREDOMINANT (A)  Final   Report Status PENDING  Incomplete  Aerobic/Anaerobic Culture (surgical/deep wound)     Status: Abnormal (Preliminary result)   Collection Time: 10/07/17  2:24 AM  Result Value Ref Range Status   Specimen Description   Final  LEG RIGHT Performed at [facility], [place] [address] [place], [place] [address]    Special Requests   Final    NONE Performed at [facility], [place], [place] [address]    Gram Stain   Final    NO WBC SEEN MODERATE GRAM NEGATIVE RODS FEW GRAM POSITIVE RODS RARE GRAM POSITIVE COCCI Performed at [facility], [place] [address] [place], [place] [address]    Culture MULTIPLE ORGANISMS PRESENT, NONE PREDOMINANT (A)  Final   Report Status PENDING  Incomplete    Coagulation Studies: Recent Labs    10/06/17 2014/03/03 10/07/17 0127  LABPROT 17.7* 16.6*  INR 1.47 1.35    Urinalysis:  Recent Labs  Lab 10/07/17 0224  COLORURINE STRAW*  LABSPEC 1.008   PHURINE 5.0  GLUCOSEU NEGATIVE  HGBUR NEGATIVE  BILIRUBINUR NEGATIVE  KETONESUR NEGATIVE  PROTEINUR NEGATIVE  NITRITE NEGATIVE  LEUKOCYTESUR NEGATIVE    Lipid Panel:  No results found for: CHOL, TRIG, HDL, CHOLHDL, VLDL, LDLCALC  HgbA1C:  Lab Results  Component Value Date   HGBA1C 6.6 (H) 06/18/2017    Urine Drug Screen:      Component Value Date/Time   LABOPIA NONE DETECTED 10/07/2017 0224   COCAINSCRNUR NONE DETECTED 10/07/2017 0224   LABBENZ NONE DETECTED 10/07/2017 0224   AMPHETMU NONE DETECTED 10/07/2017 0224   THCU NONE DETECTED 10/07/2017 0224   LABBARB NONE DETECTED 10/07/2017 0224    Alcohol Level:  Recent Labs  Lab 10/07/17 0127  ETH <10    Other results: EKG: sinus bradycardia. Vent. rate 49 BPM PR interval 192 ms QRS duration 96 ms QT/QTc 438/395 ms P-R-T axes 50 23 44  Imaging: Dg Chest 1 View  Result Date: 10/07/2017 CLINICAL DATA:  Feeding tube, evaluate placement of endotracheal tube EXAM: CHEST  1 VIEW COMPARISON:  Portable chest x-ray of 10/06/2016 FINDINGS: The tip of the endotracheal tube is approximately 3.9 cm above the carina. The lungs remain poorly aerated and cardiomegaly is stable. Haziness at both lung bases may reflect atelectasis and effusion although pneumonia cannot be excluded. NG tube extends below the hemidiaphragm. IMPRESSION: 1. Endotracheal tube tip 3.9 cm above the carina. 2. Poor aeration with basilar volume loss, probable effusions, and pneumonia cannot be excluded. 3. Stable cardiomegaly. 4. NG tube extends below the hemidiaphragm. Electronically Signed   By: Ivar Drape M.D.   On: 10/07/2017 14:03   Dg Abd 1 View  Result Date: 10/07/2017 CLINICAL DATA:  Evaluate position of feeding tube EXAM: ABDOMEN - 1 VIEW COMPARISON:  Abdomen 10/07/2016 FINDINGS: A tip of the feeding tube is seen within the fundus dex proximal body of the stomach. Bowel gas pattern is nonspecific. Some volume loss is again noted at the lung bases and  cardiomegaly is stable IMPRESSION: Feeding tube tip extends to the region of the fundus-proximal stomach. The bowel gas pattern is nonspecific Electronically Signed   By: Ivar Drape M.D.   On: 10/07/2017 14:04   Dg Chest Port 1 View  Result Date: 10/09/2017 CLINICAL DATA:  Acute respiratory failure. EXAM: PORTABLE CHEST 1 VIEW COMPARISON:  Radiograph 10/07/2017 FINDINGS: Endotracheal tube 4.4 cm from the carina. Enteric tube in place with tip and side-port below the diaphragm. Cardiomegaly is unchanged. Worsening hazy opacities throughout both lungs. Bibasilar volume loss again seen with associated effusions. No pneumothorax. IMPRESSION: Worsening lung aeration with increasing hazy opacities throughout both lungs, likely combination of pleural fluid, atelectasis, and pulmonary edema. Electronically Signed  By: Keith Rake M.D.   On: 10/09/2017 04:06    Patient seen and examined.  Clinical course and management discussed.  Necessary edits performed.  I agree with the above.  Assessment and plan of care developed and discussed below.     Assessment: 84 y.o. male  with multiple medical comorbidities including chronic diastolic congestive heart failure, cellulitis chronic kidney disease stage III, COPD, coronary artery disease, diabetes, hypertension, hyperlipidemia presenting to the ED status post cardiac arrest.  Patient currently intubated and sedated, warmed s/p arctic sun protocol.  CT head on presentation personally reviewed and shows no acute intracranial abnormality.  Initial chest x-ray shows LLL opacity.  Echocardiogram with an EF of 65% and consistent with severe pulmonary hypertension.  EEG abnormal secondary to general background slowing otherwise no evidence of epileptiform discharges.   Patient remains sedated and therefore a full neurological examination is unable to be performed.  Has been normothermic since 1p on 10/2 (only about 24 hours).  Has metabolic/infectious issues as well.   At this time unable to make any accurate determinations about prognosis.    Recommendations 1. Will need to examine off sedation 2. Repeat head CT without contrast 3. Agree with treatment of infection.  Patient currently on Cefepime.  May need to consider addressing in the future since this may have CNS side effects to include encephalopathy.    This patient was staffed with Dr. Magda Paganini, Doy Mince who personally evaluated patient, reviewed documentation and agreed with assessment and plan of care as above.  Rufina Falco, DNP, FNP-BC Board certified Nurse Practitioner Neurology Department  10/09/2017, 1:05 PM   Alexis Goodell, MD Neurology [phone]  10/09/2017  2:33 PM

## 2017-10-10 ENCOUNTER — Inpatient Hospital Stay: Payer: Medicare Other

## 2017-10-10 DIAGNOSIS — Z978 Presence of other specified devices: Secondary | ICD-10-CM

## 2017-10-10 LAB — CBC
HCT: 26 % — ABNORMAL LOW (ref 40.0–52.0)
Hemoglobin: 8.5 g/dL — ABNORMAL LOW (ref 13.0–18.0)
MCH: 28.1 pg (ref 26.0–34.0)
MCHC: 32.7 g/dL (ref 32.0–36.0)
MCV: 86.1 fL (ref 80.0–100.0)
PLATELETS: 244 10*3/uL (ref 150–440)
RBC: 3.02 MIL/uL — AB (ref 4.40–5.90)
RDW: 18.7 % — ABNORMAL HIGH (ref 11.5–14.5)
WBC: 6.7 10*3/uL (ref 3.8–10.6)

## 2017-10-10 LAB — GLUCOSE, CAPILLARY
GLUCOSE-CAPILLARY: 256 mg/dL — AB (ref 70–99)
GLUCOSE-CAPILLARY: 216 mg/dL — AB (ref 70–99)
GLUCOSE-CAPILLARY: 267 mg/dL — AB (ref 70–99)
Glucose-Capillary: 219 mg/dL — ABNORMAL HIGH (ref 70–99)
Glucose-Capillary: 247 mg/dL — ABNORMAL HIGH (ref 70–99)
Glucose-Capillary: 269 mg/dL — ABNORMAL HIGH (ref 70–99)

## 2017-10-10 LAB — BASIC METABOLIC PANEL
Anion gap: 5 (ref 5–15)
BUN: 49 mg/dL — AB (ref 8–23)
CHLORIDE: 110 mmol/L (ref 98–111)
CO2: 26 mmol/L (ref 22–32)
CREATININE: 1.19 mg/dL (ref 0.61–1.24)
Calcium: 8 mg/dL — ABNORMAL LOW (ref 8.9–10.3)
GFR calc Af Amer: >60 mL/min (ref >60)
GFR, EST NON AFRICAN AMERICAN: 55 mL/min — AB (ref >60)
GLUCOSE: 255 mg/dL — AB (ref 70–99)
POTASSIUM: 5 mmol/L (ref 3.5–5.1)
Sodium: 141 mmol/L (ref 135–145)

## 2017-10-10 MED ORDER — FUROSEMIDE 10 MG/ML IJ SOLN
40.0000 mg | Freq: Once | INTRAMUSCULAR | Status: AC
  Administered 2017-10-10: 40 mg via INTRAVENOUS
  Filled 2017-10-10: qty 4

## 2017-10-10 MED ORDER — INSULIN DETEMIR 100 UNIT/ML ~~LOC~~ SOLN
10.0000 [IU] | Freq: Two times a day (BID) | SUBCUTANEOUS | Status: DC
  Administered 2017-10-10 – 2017-10-22 (×25): 10 [IU] via SUBCUTANEOUS
  Filled 2017-10-10 (×27): qty 0.1

## 2017-10-10 MED ORDER — SODIUM CHLORIDE 0.9 % IV SOLN
2.0000 g | Freq: Two times a day (BID) | INTRAVENOUS | Status: DC
  Administered 2017-10-10 – 2017-10-11 (×3): 2 g via INTRAVENOUS
  Filled 2017-10-10 (×5): qty 2

## 2017-10-10 MED ORDER — HYDRALAZINE HCL 20 MG/ML IJ SOLN
10.0000 mg | INTRAMUSCULAR | Status: DC | PRN
  Administered 2017-10-10 – 2017-10-13 (×4): 20 mg via INTRAVENOUS
  Administered 2017-10-19: 10 mg via INTRAVENOUS
  Administered 2017-10-19: 20 mg via INTRAVENOUS
  Filled 2017-10-10 (×5): qty 1

## 2017-10-10 MED ORDER — INSULIN ASPART 100 UNIT/ML ~~LOC~~ SOLN
SUBCUTANEOUS | Status: AC
  Administered 2017-10-10: 1 [IU]
  Filled 2017-10-10: qty 1

## 2017-10-10 MED ORDER — HYDRALAZINE HCL 20 MG/ML IJ SOLN
INTRAMUSCULAR | Status: AC
  Filled 2017-10-10: qty 1

## 2017-10-10 MED ORDER — MIDAZOLAM HCL 2 MG/2ML IJ SOLN
2.0000 mg | Freq: Once | INTRAMUSCULAR | Status: AC
  Administered 2017-10-10: 2 mg via INTRAVENOUS
  Filled 2017-10-10: qty 2

## 2017-10-10 MED ORDER — INSULIN ASPART 100 UNIT/ML ~~LOC~~ SOLN
4.0000 [IU] | SUBCUTANEOUS | Status: DC
  Administered 2017-10-10 (×2): 4 [IU] via SUBCUTANEOUS
  Filled 2017-10-10 (×2): qty 1

## 2017-10-10 MED ORDER — INSULIN ASPART 100 UNIT/ML ~~LOC~~ SOLN
0.0000 [IU] | SUBCUTANEOUS | Status: DC
  Administered 2017-10-10: 11 [IU] via SUBCUTANEOUS
  Administered 2017-10-11: 7 [IU] via SUBCUTANEOUS
  Administered 2017-10-11: 4 [IU] via SUBCUTANEOUS
  Administered 2017-10-11 (×2): 7 [IU] via SUBCUTANEOUS
  Administered 2017-10-11: 4 [IU] via SUBCUTANEOUS
  Administered 2017-10-11: 7 [IU] via SUBCUTANEOUS
  Administered 2017-10-11: 3 [IU] via SUBCUTANEOUS
  Administered 2017-10-12 (×3): 4 [IU] via SUBCUTANEOUS
  Administered 2017-10-12 – 2017-10-13 (×2): 7 [IU] via SUBCUTANEOUS
  Administered 2017-10-13: 3 [IU] via SUBCUTANEOUS
  Administered 2017-10-13: 4 [IU] via SUBCUTANEOUS
  Administered 2017-10-13: 3 [IU] via SUBCUTANEOUS
  Administered 2017-10-13: 4 [IU] via SUBCUTANEOUS
  Administered 2017-10-13: 7 [IU] via SUBCUTANEOUS
  Administered 2017-10-14: 4 [IU] via SUBCUTANEOUS
  Administered 2017-10-14 (×3): 3 [IU] via SUBCUTANEOUS
  Administered 2017-10-14: 4 [IU] via SUBCUTANEOUS
  Administered 2017-10-14: 3 [IU] via SUBCUTANEOUS
  Administered 2017-10-15: 1 [IU] via SUBCUTANEOUS
  Administered 2017-10-15: 3 [IU] via SUBCUTANEOUS
  Administered 2017-10-15: 4 [IU] via SUBCUTANEOUS
  Administered 2017-10-15 – 2017-10-17 (×10): 3 [IU] via SUBCUTANEOUS
  Administered 2017-10-17: 4 [IU] via SUBCUTANEOUS
  Administered 2017-10-17 – 2017-10-19 (×7): 3 [IU] via SUBCUTANEOUS
  Administered 2017-10-19: 4 [IU] via SUBCUTANEOUS
  Administered 2017-10-20 – 2017-10-21 (×3): 3 [IU] via SUBCUTANEOUS
  Administered 2017-10-21: 4 [IU] via SUBCUTANEOUS
  Administered 2017-10-21 – 2017-10-22 (×4): 3 [IU] via SUBCUTANEOUS
  Administered 2017-10-22 (×3): 4 [IU] via SUBCUTANEOUS
  Administered 2017-10-22: 11 [IU] via SUBCUTANEOUS
  Administered 2017-10-23: 3 [IU] via SUBCUTANEOUS
  Administered 2017-10-23 – 2017-10-24 (×6): 4 [IU] via SUBCUTANEOUS
  Administered 2017-10-24: 3 [IU] via SUBCUTANEOUS
  Administered 2017-10-24: 4 [IU] via SUBCUTANEOUS
  Administered 2017-10-25 (×2): 3 [IU] via SUBCUTANEOUS
  Administered 2017-10-25 (×2): 4 [IU] via SUBCUTANEOUS
  Administered 2017-10-25: 3 [IU] via SUBCUTANEOUS
  Administered 2017-10-26: 4 [IU] via SUBCUTANEOUS
  Administered 2017-10-26: 3 [IU] via SUBCUTANEOUS
  Administered 2017-10-26: 4 [IU] via SUBCUTANEOUS
  Administered 2017-10-26 – 2017-10-27 (×5): 3 [IU] via SUBCUTANEOUS
  Administered 2017-10-27 (×2): 4 [IU] via SUBCUTANEOUS
  Administered 2017-10-27: 3 [IU] via SUBCUTANEOUS
  Filled 2017-10-10 (×80): qty 1

## 2017-10-10 NOTE — Procedures (Signed)
ELECTROENCEPHALOGRAM REPORT   Patient: Billy Liu       Room #: IC06A-AA EEG No. ID: [id number] Age: 84 y.o.        Sex: male Referring Physician: [place] Report Date:  10/10/2017        Interpreting Physician: Thana Farr  History: Lev Cervone is an 84 y.o. male s/p arrest  Medications:  Xanax, Pepcid, Insulin, MVI, Miralax, Depacon  Conditions of Recording:  This is a 21 channel routine scalp EEG performed with bipolar and monopolar montages arranged in accordance to the international 10/20 system of electrode placement. One channel was dedicated to EKG recording.  The patient is in the intubated and unresponsive state.  Description:  The background activity is slow and poorly organized.  It consists of a low voltage, mixture of theta and delta activity that is diffusely distributed.  Over the left centro-parietal region though this activity seems to be of higher voltage and includes faster rhythms.  At times a 8Hz  posterior background rhythm is noted No epileptiform activity is noted.   Hyperventilation and intermittent photic stimulation were not performed   IMPRESSION: This is an abnormal EEG secondary to general background slowing.  This finding may be seen with a diffuse disturbance that is etiologically nonspecific, but may include a metabolic encephalopathy, among other possibilities.  No epileptiform activity was noted.  The focal findings over the left centro-parietal region have unclear significance and may artifactual.  Clinical correlation correlation recommended.  EEG not significantly changed from the recording on 10/07/2017.   Thana Farr, MD Neurology [phone] 10/10/2017, 1:11 PM

## 2017-10-10 NOTE — Progress Notes (Signed)
Follow up - Critical Care Medicine Note  Patient Details:    Billy Liu is an 84 y.o. male.  With past medical history remarkable for hypertension, hyperlipidemia, gastroesophageal reflux disease, depression, Alzheimer's, coronary artery disease, COPD, stage III renal failure, chronic diastolic heart failure,  admitted with acute on chronic renal failure,  status post cardiac arrest, mechanically intubated, hypothermic protocol initiated @36  degrees C.   Lines, Airways, Drains: Airway 7.5 mm (Active)  Secured at (cm) 24 cm 10/10/2017  8:15 AM  Measured From Lips 10/10/2017  8:15 AM  Secured Location Left 10/10/2017  8:15 AM  Secured By [employer] 10/10/2017  8:15 AM  Tube Holder Repositioned Yes 10/10/2017  8:15 AM  Cuff Pressure (cm H2O) 28 cm H2O 10/10/2017  8:15 AM  Site Condition Dry 10/10/2017  8:15 AM     NG/OG Tube Orogastric Left mouth Xray Documented cm marking at nare/ corner of mouth 76 cm (Active)  Cm Marking at Nare/Corner of Mouth (if applicable) 70 cm 10/10/2017  8:00 AM  Site Assessment Clean;Dry;Intact 10/10/2017  8:00 AM  Ongoing Placement Verification No change in cm markings or external length of tube from initial placement;No acute changes, not attributed to clinical condition;No change in respiratory status 10/10/2017  8:00 AM  Status Infusing tube feed 10/10/2017  8:00 AM  Intake (mL) 120 mL 10/08/2017  6:14 PM     Urethral Catheter Urology MD Coude 16 Fr. (Active)  Indication for Insertion or Continuance of Catheter Unstable critical patients (first 24-48 hours);Bladder outlet obstruction / other urologic reason 10/10/2017  8:00 AM  Site Assessment Intact;Swelling;Edema 10/08/2017  7:45 AM  Catheter Maintenance Bag below level of bladder;Catheter secured;Drainage bag/tubing not touching floor;Insertion date on drainage bag;No dependent loops;Seal intact 10/10/2017  8:00 AM  Collection Container Standard drainage bag 10/10/2017  8:00 AM  Securement Method Tape  10/10/2017  8:00 AM  Urinary Catheter Interventions Unclamped 10/10/2017  8:00 AM  Output (mL) 1100 mL 10/10/2017  6:01 AM    Anti-infectives:  Anti-infectives (From admission, onward)   Start     Dose/Rate Route Frequency Ordered Stop   10/07/17 0600  ceFEPIme (MAXIPIME) 2 g in sodium chloride 0.9 % 100 mL IVPB  Status:  Discontinued     2 g 200 mL/hr over 30 Minutes Intravenous Every 24 hours 10/07/17 0050 10/07/17 0210   10/07/17 0600  vancomycin (VANCOCIN) 1,500 mg in sodium chloride 0.9 % 500 mL IVPB  Status:  Discontinued     1,500 mg 250 mL/hr over 120 Minutes Intravenous Every 24 hours 10/07/17 0050 10/07/17 1136   10/07/17 0600  ceFEPIme (MAXIPIME) 2 g in sodium chloride 0.9 % 100 mL IVPB  Status:  Discontinued     2 g 200 mL/hr over 30 Minutes Intravenous Every 12 hours 10/07/17 0210 10/09/17 1124   10/06/17 2130  ceFEPIme (MAXIPIME) 1 g in sodium chloride 0.9 % 100 mL IVPB     1 g 200 mL/hr over 30 Minutes Intravenous  Once 10/06/17 2126 10/07/17 0016   10/06/17 2130  vancomycin (VANCOCIN) IVPB 1000 mg/200 mL premix     1,000 mg 200 mL/hr over 60 Minutes Intravenous  Once 10/06/17 2126 10/07/17 0115      Microbiology: Results for orders placed or performed during the hospital encounter of 10/06/17  Blood culture (routine x 2)     Status: None (Preliminary result)   Collection Time: 10/06/17 10:43 PM  Result Value Ref Range Status   Specimen Description BLOOD LEFT HAND  Final   Special Requests   Final    BOTTLES DRAWN AEROBIC AND ANAEROBIC Blood Culture adequate volume   Culture   Final    NO GROWTH 4 DAYS Performed at [facility], [address] [place], [place] [address]    Report Status PENDING  Incomplete  Blood culture (routine x 2)     Status: None (Preliminary result)   Collection Time: 10/06/17 10:44 PM  Result Value Ref Range Status   Specimen Description BLOOD BLOOD LEFT FOREARM  Final   Special Requests   Final    BOTTLES DRAWN AEROBIC AND  ANAEROBIC Blood Culture adequate volume   Culture   Final    NO GROWTH 4 DAYS Performed at [facility], [address], [place], [place] [address]    Report Status PENDING  Incomplete  MRSA PCR Screening     Status: None   Collection Time: 10/07/17  1:07 AM  Result Value Ref Range Status   MRSA by PCR NEGATIVE NEGATIVE Final    Comment:        The GeneXpert MRSA Assay (FDA approved for NASAL specimens only), is one component of a comprehensive MRSA colonization surveillance program. It is not intended to diagnose MRSA infection nor to guide or monitor treatment for MRSA infections. Performed at [facility], [address], [place], [place] [address]   Urine Culture     Status: None   Collection Time: 10/07/17  2:24 AM  Result Value Ref Range Status   Specimen Description   Final    URINE, RANDOM Performed at [facility], [address], [place], [place] [address]    Special Requests   Final    NONE Performed at [facility], [address] [place], [place] [address]    Culture   Final    NO GROWTH Performed at [facility], [address] [place], [place] [address]    Report Status 10/08/2017 FINAL  Final  Aerobic/Anaerobic Culture (surgical/deep wound)     Status: Abnormal (Preliminary result)   Collection Time: 10/07/17  2:24 AM  Result Value Ref Range Status   Specimen Description TOE LEFT  Final   Special Requests PENDING  Incomplete   Gram Stain   Final    RARE SQUAMOUS EPITHELIAL CELLS PRESENT NO WBC SEEN MODERATE GRAM POSITIVE RODS FEW GRAM POSITIVE COCCI RARE GRAM NEGATIVE RODS Performed at [facility], [address] [place], [place] [address]    Culture (A)  Final    MULTIPLE ORGANISMS PRESENT, NONE PREDOMINANT NO ANAEROBES ISOLATED; CULTURE IN PROGRESS FOR 5 DAYS    Report Status PENDING  Incomplete  Aerobic/Anaerobic Culture (surgical/deep wound)     Status: Abnormal (Preliminary result)    Collection Time: 10/07/17  2:24 AM  Result Value Ref Range Status   Specimen Description   Final    LEG RIGHT Performed at [facility], [address], [place], [place] [address]    Special Requests   Final    NONE Performed at [facility], [address] [place], [place] [address]    Gram Stain   Final    NO WBC SEEN MODERATE GRAM NEGATIVE RODS FEW GRAM POSITIVE RODS RARE GRAM POSITIVE COCCI Performed at [facility], [address], [place], [place] [address]    Culture (A)  Final    MULTIPLE ORGANISMS PRESENT, NONE PREDOMINANT NO ANAEROBES ISOLATED; CULTURE IN PROGRESS FOR 5 DAYS  Report Status PENDING  Incomplete   Studies: Dg Chest 1 View  Result Date: 10/07/2017 CLINICAL DATA:  Feeding tube, evaluate placement of endotracheal tube EXAM: CHEST  1 VIEW COMPARISON:  Portable chest x-ray of 10/06/2016 FINDINGS: The tip of the endotracheal tube is approximately 3.9 cm above the carina. The lungs remain poorly aerated and cardiomegaly is stable. Haziness at both lung bases may reflect atelectasis and effusion although pneumonia cannot be excluded. NG tube extends below the hemidiaphragm. IMPRESSION: 1. Endotracheal tube tip 3.9 cm above the carina. 2. Poor aeration with basilar volume loss, probable effusions, and pneumonia cannot be excluded. 3. Stable cardiomegaly. 4. NG tube extends below the hemidiaphragm. Electronically Signed   By: Dwyane Dee M.D.   On: 10/07/2017 14:03   Dg Abd 1 View  Result Date: 10/07/2017 CLINICAL DATA:  Evaluate position of feeding tube EXAM: ABDOMEN - 1 VIEW COMPARISON:  Abdomen 10/07/2016 FINDINGS: A tip of the feeding tube is seen within the fundus dex proximal body of the stomach. Bowel gas pattern is nonspecific. Some volume loss is again noted at the lung bases and cardiomegaly is stable IMPRESSION: Feeding tube tip extends to the region of the fundus-proximal stomach. The bowel gas pattern is nonspecific Electronically  Signed   By: Dwyane Dee M.D.   On: 10/07/2017 14:04   Dg Abd 1 View  Result Date: 10/07/2017 CLINICAL DATA:  84 year old male with nasogastric tube placement. Subsequent encounter. EXAM: ABDOMEN - 1 VIEW COMPARISON:  10/07/2017 2:36 a.m. FINDINGS: Nasogastric tube radiopaque marker can only be well delineated to the level of the distal esophagus. Tip of the nasogastric tube may enter the stomach but is not adequately assessed secondary to overlying leads in the left upper quadrant. Removal of leads or change of lead position and repeat exam may prove helpful for further delineation. Nonspecific bowel gas pattern. IMPRESSION: Nasogastric tube radiopaque marker can only be well delineated to the level of the distal esophagus. Tip of the nasogastric tube may enter the stomach but is not adequately assessed secondary to overlying leads in the left upper quadrant. Removal of leads or change of lead position and repeat exam may prove helpful for further delineation. These results will be called to the ordering clinician or representative by the Radiologist Assistant, and communication documented in the PACS or zVision Dashboard. Electronically Signed   By: Lacy Duverney M.D.   On: 10/07/2017 08:32   Dg Abd 1 View  Result Date: 10/07/2017 CLINICAL DATA:  OG tube placement, second attempt EXAM: ABDOMEN - 1 VIEW COMPARISON:  10/07/2017 at 0228 hours FINDINGS: Enteric tube terminates in the gastric cardia with its side port at/just above the GE junction. IMPRESSION: Enteric tube terminates in the gastric cardia with its side port at/just above the GE junction. Electronically Signed   By: Charline Bills M.D.   On: 10/07/2017 02:48   Dg Abd 1 View  Result Date: 10/07/2017 CLINICAL DATA:  OG tube placement EXAM: ABDOMEN - 1 VIEW COMPARISON:  10/06/2017 FINDINGS: Enteric tube terminates at the GE junction with the side port in the distal esophagus. IMPRESSION: Enteric tube terminates at the GE junction with the  side port in the distal esophagus. Electronically Signed   By: Charline Bills M.D.   On: 10/07/2017 02:47   Dg Abd 1 View  Result Date: 10/06/2017 CLINICAL DATA:  OG tube EXAM: ABDOMEN - 1 VIEW COMPARISON:  None. FINDINGS: By history patient has an OG tube. Just to the RIGHT of the thoracic  spine, there is a radiopaque line, possibly an orogastric tube. However this is slightly to the RIGHT of the expected course of the esophagus and does not extend into the LEFT UPPER QUADRANT. It is possible that orogastric tube has been withdrawn or removed and not included on the film. There is dense opacification of the LEFT lung base. Visualized bowel gas pattern is nonobstructive. IMPRESSION: Possible orogastric tube to the distal esophagus, but this appears slightly to the RIGHT of the expected course of the esophagus. Recommend confirmation of orogastric tube placement and consider advancing tube further into the stomach if this is felt to be the orogastric tube. Significant LEFT LOWER lobe opacity. Electronically Signed   By: Norva Pavlov M.D.   On: 10/06/2017 21:00   Ct Head Wo Contrast  Result Date: 10/09/2017 CLINICAL DATA:  Question anoxic brain injury.  Cardiac arrest EXAM: CT HEAD WITHOUT CONTRAST TECHNIQUE: Contiguous axial images were obtained from the base of the skull through the vertex without intravenous contrast. 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 Small remote lateral left frontal infarct best seen on reformats. There is generalized atrophy. Small remote left cerebellar infarct. No detected acute infarct or swelling. No hemorrhage, hydrocephalus, or collection. Vascular: Atherosclerotic calcification.  No hyperdense vessel. Skull: Remote left frontal craniotomy.  No acute finding Sinuses/Orbits: No acute finding IMPRESSION: 1. No evident anoxic injury. 2. 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 Electronically Signed   By:  Marnee Spring M.D.   On: 10/09/2017 14:46   Ct Head Wo Contrast  Result Date: 10/06/2017 CLINICAL DATA:  Altered mental status (AMS), unclear cause; C-spine trauma, high clinical risk (NEXUS/CCR). Collapsed in the bathroom. EXAM: CT HEAD WITHOUT CONTRAST CT CERVICAL SPINE WITHOUT CONTRAST TECHNIQUE: Multidetector CT imaging of the head and cervical spine was performed following the standard protocol without intravenous contrast. Multiplanar CT image reconstructions of the cervical spine were also generated. COMPARISON:  Head CT 06/17/2017 FINDINGS: CT HEAD FINDINGS Brain: Unchanged degree of atrophy and chronic small vessel ischemia. Encephalomalacia in the left parietal lobe again seen. No intracranial hemorrhage, mass effect, or midline shift. No hydrocephalus. The basilar cisterns are patent. No evidence of territorial infarct or acute ischemia. No extra-axial or intracranial fluid collection. Vascular: Atherosclerosis of skullbase vasculature without hyperdense vessel or abnormal calcification. Skull: Prior left frontoparietal craniotomy.  No skull fracture. Sinuses/Orbits: Paranasal sinuses and mastoid air cells are clear. The visualized orbits are unremarkable. Bilateral cataract resection. Other: None. CT CERVICAL SPINE FINDINGS Alignment: Straightening of normal cervical lordosis. Skull base and vertebrae: No acute fracture. Bulky anterior osteophytes from C3-C4 through C6-C7, well corticated lucency through the osteophytes at C4 and C5 may represent remote prior trauma. The dens and skull base are intact. Soft tissues and spinal canal: Debris in the hypopharynx limits assessment for prevertebral edema, no gross prevertebral soft tissue thickening. No evidence of canal hematoma. Disc levels: Near complete disc space loss at C2-C3 with bony ankylosis of the posterior elements on the left. Disc space narrowing at C5-C6 and C6-C7. 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 Bulky  anterior osteophytes throughout, some of which are fragmented. Multilevel facet arthropathy. Upper chest: Moderate right pleural effusion, partially included. Other: Soft tissue prominence involving the right aspect of the hypopharynx is incompletely included in the field of view, causing mild leftward endotracheal and enteric tube deviation. There are dense vascular calcifications. IMPRESSION: 1.  No acute intracranial abnormality.  No skull fracture. 2. Unchanged  atrophy, chronic small vessel ischemia, and left parietal encephalomalacia. 3. Advanced multilevel degenerative change in the cervical spine without evidence of acute fracture. 4. Soft tissue prominence involving the right hypopharynx is only partially included in the field of view, this may represent retained secretions or asymmetric positioning of the patient's tongue, however underlying mass lesion is not excluded. Consider contrast enhanced CT of the neck as clinically indicated. 5. Moderate right pleural effusion partially included in the field of view. Electronically Signed   By: Narda Rutherford M.D.   On: 10/06/2017 21:38   Ct Cervical Spine Wo Contrast  Result Date: 10/06/2017 CLINICAL DATA:  Altered mental status (AMS), unclear cause; C-spine trauma, high clinical risk (NEXUS/CCR). Collapsed in the bathroom. EXAM: CT HEAD WITHOUT CONTRAST CT CERVICAL SPINE WITHOUT CONTRAST TECHNIQUE: Multidetector CT imaging of the head and cervical spine was performed following the standard protocol without intravenous contrast. Multiplanar CT image reconstructions of the cervical spine were also generated. COMPARISON:  Head CT 06/17/2017 FINDINGS: CT HEAD FINDINGS Brain: Unchanged degree of atrophy and chronic small vessel ischemia. Encephalomalacia in the left parietal lobe again seen. No intracranial hemorrhage, mass effect, or midline shift. No hydrocephalus. The basilar cisterns are patent. No evidence of territorial infarct or acute ischemia. No  extra-axial or intracranial fluid collection. Vascular: Atherosclerosis of skullbase vasculature without hyperdense vessel or abnormal calcification. Skull: Prior left frontoparietal craniotomy.  No skull fracture. Sinuses/Orbits: Paranasal sinuses and mastoid air cells are clear. The visualized orbits are unremarkable. Bilateral cataract resection. Other: None. CT CERVICAL SPINE FINDINGS Alignment: Straightening of normal cervical lordosis. Skull base and vertebrae: No acute fracture. Bulky anterior osteophytes from C3-C4 through C6-C7, well corticated lucency through the osteophytes at C4 and C5 may represent remote prior trauma. The dens and skull base are intact. Soft tissues and spinal canal: Debris in the hypopharynx limits assessment for prevertebral edema, no gross prevertebral soft tissue thickening. No evidence of canal hematoma. Disc levels: Near complete disc space loss at C2-C3 with bony ankylosis of the posterior elements on the left. Disc space narrowing at C5-C6 and C6-C7. 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 Bulky anterior osteophytes throughout, some of which are fragmented. Multilevel facet arthropathy. Upper chest: Moderate right pleural effusion, partially included. Other: Soft tissue prominence involving the right aspect of the hypopharynx is incompletely included in the field of view, causing mild leftward endotracheal and enteric tube deviation. There are dense vascular calcifications. IMPRESSION: 1.  No acute intracranial abnormality.  No skull fracture. 2. Unchanged atrophy, chronic small vessel ischemia, and left parietal encephalomalacia. 3. Advanced multilevel degenerative change in the cervical spine without evidence of acute fracture. 4. Soft tissue prominence involving the right hypopharynx is only partially included in the field of view, this may represent retained secretions or asymmetric positioning of the patient's tongue, however underlying mass lesion is  not excluded. Consider contrast enhanced CT of the neck as clinically indicated. 5. Moderate right pleural effusion partially included in the field of view. Electronically Signed   By: Narda Rutherford M.D.   On: 10/06/2017 21:38   Dg Chest Port 1 View  Result Date: 10/09/2017 CLINICAL DATA:  Acute respiratory failure. EXAM: PORTABLE CHEST 1 VIEW COMPARISON:  Radiograph 10/07/2017 FINDINGS: Endotracheal tube 4.4 cm from the carina. Enteric tube in place with tip and side-port below the diaphragm. Cardiomegaly is unchanged. Worsening hazy opacities throughout both lungs. Bibasilar volume loss again seen with associated effusions. No pneumothorax. IMPRESSION: Worsening lung aeration with increasing hazy opacities throughout  both lungs, likely combination of pleural fluid, atelectasis, and pulmonary edema. Electronically Signed   By: Narda Rutherford M.D.   On: 10/09/2017 04:06   Dg Chest Portable 1 View  Result Date: 10/06/2017 CLINICAL DATA:  Ett tube EXAM: PORTABLE CHEST 1 VIEW COMPARISON:  06/20/2017 FINDINGS: Interval placement of endotracheal tube, tip approximately 3.0 centimeters above the carina. An orogastric tube is in place, tip overlying the level of the LOWER esophagus but difficult to confirm. The heart is enlarged. There is dense opacity in the LEFT lung base. Perihilar opacities are consistent with pulmonary edema. IMPRESSION: Interval placement of endotracheal tube and orogastric tube. LEFT LOWER lobe opacity and pulmonary edema. Electronically Signed   By: Norva Pavlov M.D.   On: 10/06/2017 21:02   Dg Abd Portable 1v  Result Date: 10/07/2017 CLINICAL DATA:  Orogastric tube placement. EXAM: PORTABLE ABDOMEN - 1 VIEW COMPARISON:  Radiograph of same day. FINDINGS: The bowel gas pattern is normal. Distal tip of nasogastric tube is seen in proximal stomach, with side hole at expected position of gastroesophageal junction. No radio-opaque calculi or other significant radiographic  abnormality are seen. IMPRESSION: Distal tip of nasogastric tube seen in proximal stomach, with side hole at expected position of gastroesophageal junction. Advancement is recommended. Electronically Signed   By: Lupita Raider, M.D.   On: 10/07/2017 12:18    Consults: Treatment Team:  Pccm, Raymond Gurney, MD Laurier Nancy, MD Kym Groom, MD   Subjective:    Overnight Issues: Patient was rewarmed yesterday, CT scan of the head was performed which revealed old left frontal parietal infarct without any acute ischemic changes noted.  Patient presently on sedation and mechanical ventilation did not pass spontaneous awakening and breathing trial yesterday  Objective:  Vital signs for last 24 hours: Temp:  [97.7 F (36.5 C)-99 F (37.2 C)] 98 F (36.7 C) (10/04 0800) Pulse Rate:  [64-89] 68 (10/04 0900) Resp:  [0-24] 0 (10/04 0900) BP: (130-174)/(43-82) 153/51 (10/04 0900) SpO2:  [94 %-100 %] 98 % (10/04 0900) FiO2 (%):  [30 %] 30 % (10/04 0815) Weight:  [126.9 kg] 126.9 kg (10/04 0239)  Hemodynamic parameters for last 24 hours:    Intake/Output from previous day: 10/03 0701 - 10/04 0700 In: 4443.5 [P.O.:610; I.V.:2243; NG/GT:1080.8; IV Piggyback:509.8] Out: 1100 [Urine:1100]  Intake/Output this shift: Total I/O In: 192.2 [I.V.:192.2] Out: -   Vent settings for last 24 hours: Vent Mode: PRVC FiO2 (%):  [30 %] 30 % Set Rate:  [16 bmp] 16 bmp Vt Set:  [500 mL] 500 mL PEEP:  [5 cmH20] 5 cmH20 Plateau Pressure:  [15 cmH20-23 cmH20] 22 cmH20  Physical Exam:  Vital signs: Please see the above listed vital signs HEENT: Trachea midline, orally intubated, no accessory muscle utilization Cardiovascular: Regular rate and rhythm Abdominal: Positive bowel sounds, soft exam Extremities: No clubbing cyanosis, 1+ edema appreciated Neurologic: Patient is sedated, will track with eyes but no purposeful response  Assessment/Plan:   Status post cardiac arrest.  CT scan of the  head repeat did not show acute injury pattern however showed an old frontoparietal infarct.  Being followed by neurology, pending exam off sedation today  Hypoxemic/hypercapnic respiratory failure.  Patient remains on mechanical ventilation, weaning has been limited secondary to altered mental status post cardiac arrest.  Chest x-ray yesterday shows significant pulmonary edema patient has been 5.2 L up over the past several days.  We will institute diuresis as BP will allow  Renal failure.  BUN 49/creatinine 1.  1 9.  CO2 of 26, potassium of 5, positive urinary output, improving renal parameters  Anemia.  No evidence of active bleeding  Critical Care Total Time 40 minutes  Mackenzey Crownover 10/10/2017  *Care during the described time interval was provided by me and/or other providers on the critical care team.  I have reviewed this patient's available data, including medical history, events of note, physical examination and test results as part of my evaluation.

## 2017-10-10 NOTE — Progress Notes (Signed)
Patient ID: Billy Liu, male   DOB: 03-20-1932, 84 y.o.   MRN: [id number]  [facility] PROGRESS NOTE  Billy Liu XBD:[id number] DOB: 05/11/1932 DOA: 10/06/2017 PCP: Alvester Morin, MD  HPI/Subjective: As per nursing staff, the patient starting to move around a little bit more and not thrashing around once sedation titrated down  Objective: Vitals:   10/10/17 1300 10/10/17 1400  BP: (!) 186/49 (!) 194/56  Pulse: (!) 103 (!) 101  Resp: (!) 0 17  Temp:    SpO2: 97% 97%    Filed Weights   10/08/17 0500 10/09/17 0500 10/10/17 0239  Weight: 128 kg 132.1 kg 126.9 kg    ROS: Review of Systems  Unable to perform ROS: Acuity of condition   Exam: Physical Exam  Constitutional: He appears lethargic. He is intubated.  HENT:  Nose: Mucosal edema present.  Unable to look into mouth  Eyes: Conjunctivae and lids are normal.  Pupils pinpoint  Neck: Carotid bruit is not present.  Cardiovascular: Regular rhythm, S1 normal, S2 normal and normal heart sounds.  Respiratory: He is intubated. He has decreased breath sounds in the right lower field and the left lower field. He has no wheezes. He has rhonchi in the right lower field and the left lower field.  GI: Bowel sounds are normal. There is no tenderness.  Musculoskeletal:       Right ankle: He exhibits swelling.       Left ankle: He exhibits swelling.  Neurological: He appears lethargic.  Opens eyes and moves arms with stimulation  Skin: Skin is warm.  Right posterior calf large area of greenish discoloration and foul-smelling in an ulcer.  Left foot first toe with small ulceration and very swollen first toe.  Likely from chronic infection  Psychiatric:  Opens eyes and moves his arms to stimulation      Data Reviewed: Basic Metabolic Panel: Recent Labs  Lab 10/07/17 0127  10/07/17 0806 10/07/17 2016 10/08/17 0423 10/09/17 0331 10/10/17 0340  NA  --   --  136 137 135 140 141  K  --    < > 4.4 4.6 4.6  4.8 5.0  CL  --   --  101 105 103 106 110  CO2  --   --  '26 26 26 26 26  ' GLUCOSE  --   --  69* 98 122* 184* 255*  BUN  --   --  59* 51* 51* 48* 49*  CREATININE  --   --  1.60* 1.46* 1.49* 1.26* 1.19  CALCIUM  --   --  8.0* 7.9* 7.8* 8.0* 8.0*  MG 2.8*  --   --   --  2.7*  --   --   PHOS 5.0*  --   --   --  4.4  --   --    < > = values in this interval not displayed.   Liver Function Tests: Recent Labs  Lab 10/06/17 2016  AST 51*  ALT 25  ALKPHOS 91  BILITOT 0.5  PROT 7.0  ALBUMIN 3.5   CBC: Recent Labs  Lab 10/06/17 2016 10/07/17 0606 10/08/17 0423 10/08/17 1120 10/09/17 0331 10/10/17 0340  WBC 9.4 5.7 6.7  --  6.5 6.7  NEUTROABS 6.3 4.5  --   --   --   --   HGB 6.3* 6.5* 6.9* 8.3* 8.1* 8.5*  HCT 20.4* 19.7* 20.7* 24.9* 24.1* 26.0*  MCV 85.6 82.5 83.7  --  84.9 86.1  PLT 333 224 238  --  231 244   Cardiac Enzymes: Recent Labs  Lab 10/06/17 2016 10/07/17 0127 10/07/17 0806 10/07/17 1436 10/07/17 2016  TROPONINI <0.03 <0.03 <0.03 <0.03 <0.03    CBG: Recent Labs  Lab 10/09/17 1955 10/10/17 0029 10/10/17 0349 10/10/17 0733 10/10/17 1122  GLUCAP 229* 247* 219* 256* 216*    Recent Results (from the past 240 hour(s))  Blood culture (routine x 2)     Status: None (Preliminary result)   Collection Time: 10/06/17 10:43 PM  Result Value Ref Range Status   Specimen Description BLOOD LEFT HAND   Final   Special Requests   Final    BOTTLES DRAWN AEROBIC AND ANAEROBIC Blood Culture adequate volume   Culture   Final    NO GROWTH 4 DAYS Performed at [facility], [address], [place], [place] [address]    Report Status PENDING  Incomplete  Blood culture (routine x 2)     Status: None (Preliminary result)   Collection Time: 10/06/17 10:44 PM  Result Value Ref Range Status   Specimen Description BLOOD BLOOD LEFT FOREARM  Final   Special Requests   Final    BOTTLES DRAWN AEROBIC AND ANAEROBIC Blood Culture adequate volume   Culture   Final    NO  GROWTH 4 DAYS Performed at [facility], [address], [place], [place] [address]    Report Status PENDING  Incomplete  MRSA PCR Screening     Status: None   Collection Time: 10/07/17  1:07 AM  Result Value Ref Range Status   MRSA by PCR NEGATIVE NEGATIVE Final    Comment:        The GeneXpert MRSA Assay (FDA approved for NASAL specimens only), is one component of a comprehensive MRSA colonization surveillance program. It is not intended to diagnose MRSA infection nor to guide or monitor treatment for MRSA infections. Performed at [facility], [address], [place], [place] [address]   Urine Culture     Status: None   Collection Time: 10/07/17  2:24 AM  Result Value Ref Range Status   Specimen Description   Final    URINE, RANDOM Performed at [facility], [address] [place], [place] [address]    Special Requests   Final    NONE Performed at [facility], [address], [place], [place] [address]    Culture   Final    NO GROWTH Performed at [facility], [place] [address] [place], [place] [address]    Report Status 10/08/2017 FINAL  Final  Aerobic/Anaerobic Culture (surgical/deep wound)     Status: Abnormal (Preliminary result)   Collection Time: 10/07/17  2:24 AM  Result Value Ref Range Status   Specimen Description TOE LEFT  Final   Special Requests PENDING  Incomplete   Gram Stain   Final    RARE SQUAMOUS EPITHELIAL CELLS PRESENT NO WBC SEEN MODERATE GRAM POSITIVE RODS FEW GRAM POSITIVE COCCI RARE GRAM NEGATIVE RODS Performed at [facility], [address], [place], [place] [address]    Culture (A)  Final    MULTIPLE ORGANISMS PRESENT, NONE PREDOMINANT FEW STAPHYLOCOCCUS AUREUS WITHIN MIXED CULTURE NO ANAEROBES ISOLATED; CULTURE IN PROGRESS FOR 5 DAYS    Report Status PENDING  Incomplete  Aerobic/Anaerobic Culture (surgical/deep wound)     Status: Abnormal (Preliminary result)   Collection Time:  10/07/17  2:24 AM  Result Value Ref Range Status   Specimen Description   Final  LEG RIGHT Performed at [facility], [place] [address] [place], [place] [address]    Special Requests   Final    NONE Performed at [facility], [place], [place] [address]    Gram Stain   Final    NO WBC SEEN MODERATE GRAM NEGATIVE RODS FEW GRAM POSITIVE RODS RARE GRAM POSITIVE COCCI Performed at [facility], [place] [address] [place], [place] [address]    Culture (A)  Final    MULTIPLE ORGANISMS PRESENT, NONE PREDOMINANT NO ANAEROBES ISOLATED; CULTURE IN PROGRESS FOR 5 DAYS    Report Status PENDING  Incomplete     Studies: Ct Head Wo Contrast  Result Date: 10/09/2017 CLINICAL DATA:  Question anoxic brain injury.  Cardiac arrest EXAM: CT HEAD WITHOUT CONTRAST TECHNIQUE: Contiguous axial images were obtained from the base of the skull through the vertex without intravenous contrast. 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 Small remote lateral left frontal infarct best seen on reformats. There is generalized atrophy. Small remote left cerebellar infarct. No detected acute infarct or swelling. No hemorrhage, hydrocephalus, or collection. Vascular: Atherosclerotic calcification.  No hyperdense vessel. Skull: Remote left frontal craniotomy.  No acute finding Sinuses/Orbits: No acute finding IMPRESSION: 1. No evident anoxic injury. 2. 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 Electronically Signed   By: Monte Fantasia M.D.   On: 10/09/2017 14:46   Dg Pelvis Portable  Result Date: 10/10/2017 CLINICAL DATA:  MRI screening for metal EXAM: PORTABLE PELVIS 1-2 VIEWS COMPARISON:  10/07/2017, 11/07/2009 radiographs FINDINGS: No metallic density radiopaque foreign body is seen within the included portions of the bony pelvis and overlying soft tissues. SI joints are non widened. There is a catheter or probe inferior to  the pubic symphysis. No fracture or malalignment although right femoral neck is obscured by the trochanter. Phleboliths in the right pelvis. There may be chronic avulsion injury at the left ischium. IMPRESSION: Negative. Electronically Signed   By: Donavan Foil M.D.   On: 10/10/2017 15:27   Dg Chest Port 1 View  Result Date: 10/09/2017 CLINICAL DATA:  Acute respiratory failure. EXAM: PORTABLE CHEST 1 VIEW COMPARISON:  Radiograph 10/07/2017 FINDINGS: Endotracheal tube 4.4 cm from the carina. Enteric tube in place with tip and side-port below the diaphragm. Cardiomegaly is unchanged. Worsening hazy opacities throughout both lungs. Bibasilar volume loss again seen with associated effusions. No pneumothorax. IMPRESSION: Worsening lung aeration with increasing hazy opacities throughout both lungs, likely combination of pleural fluid, atelectasis, and pulmonary edema. Electronically Signed   By: Keith Rake M.D.   On: 10/09/2017 04:06    Scheduled Meds: . ALPRAZolam  0.5 mg Oral TID  . chlorhexidine gluconate (MEDLINE KIT)  15 mL Mouth Rinse BID  . feeding supplement (PRO-STAT SUGAR FREE 64)  60 mL Per Tube QID  . feeding supplement (VITAL HIGH PROTEIN)  1,000 mL Per Tube Q24H  . heparin  5,000 Units Subcutaneous Q8H  . Influenza vac split quadrivalent PF  0.5 mL Intramuscular Tomorrow-1000  . insulin aspart  2-6 Units Subcutaneous Q4H  . insulin aspart  4 Units Subcutaneous Q4H  . insulin detemir  10 Units Subcutaneous BID  . mouth rinse  15 mL Mouth Rinse 10 times per day  . midazolam  2 mg Intravenous Once  . multivitamin  15 mL Per Tube Daily  . polyethylene glycol  17 g Per Tube Daily  . senna-docusate  2 tablet Per Tube  BID   Continuous Infusions: . sodium chloride 50 mL/hr at 10/10/17 0150  . famotidine (PEPCID) IV 20 mg (10/10/17 1130)  . fentaNYL infusion INTRAVENOUS 100 mcg/hr (10/09/17 2217)  . norepinephrine (LEVOPHED) Adult infusion Stopped (10/09/17 0722)  . propofol  (DIPRIVAN) infusion 35 mcg/kg/min (10/10/17 0601)  . valproate sodium 250 mg (10/10/17 1206)    Assessment/Plan:  1. Cardiac arrest.  Patient underwent hypothermia protocol.  Continue supportive care.  Plan was to reevaluate with MRI today.  CT scan of the brain negative yesterday.  Placed on empiric Depacon. 2. Acute hypoxic respiratory failure.  Continue ventilator support.  When I saw him he was on 30% FiO2. 3. Left lower lobe pneumonia, right lower extremity ulceration with foul-smelling discharge, left first toe ulceration and swelling of the first toe likely chronic infection.  Case discussed with [facility] critical care team about antibiotics. 4. Hypotension.  Off pressors 5. Acute kidney injury and hyperkalemia.   This has improved. 6. History of chronic diastolic congestive heart failure 7. Overall prognosis poor and high risk for cardiopulmonary arrest.  Patient listed as full code. 8. Anemia.  Responded to transfusion.  Last hemoglobin 8.5.  Code Status:     Code Status Orders  (From admission, onward)         Start     Ordered   10/07/17 0132  Full code  Continuous     10/07/17 0131        Code Status History    Date Active Date Inactive Code Status Order ID Comments User Context   10/07/2017 0031 10/07/2017 0131 Full Code [id number]  Awilda Bill, NP ED   06/17/2017 1856 06/23/2017 2244 Full Code [id number]  Nicholes Mango, MD Inpatient   02/18/2017 1623 02/18/2017 2133 Full Code [id number]  Delana Meyer, Dolores Lory, MD Inpatient   02/11/2017 1138 02/11/2017 1736 Full Code [id number]  Schnier, Dolores Lory, MD Inpatient   10/24/2014 1934 10/31/2014 1837 Full Code [id number]  Fritzi Mandes, MD Inpatient   10/11/2014 2347 10/17/2014 1844 Full Code [id number]  Lance Coon, MD Inpatient     Family Communication: As per critical care team Disposition Plan: To be determined  Consultants:  Critical care specialist  Antibiotics:  Cefepime  Time spent: 25 minutes.  Case discussed with  critical care team  [employer]

## 2017-10-10 NOTE — Progress Notes (Signed)
eeg completed ° °

## 2017-10-10 NOTE — Progress Notes (Signed)
Inpatient Diabetes Program Recommendations  AACE/ADA: New Consensus Statement on Inpatient Glycemic Control (2015)  Target Ranges:  Prepandial:   less than 140 mg/dL      Peak postprandial:   less than 180 mg/dL (1-2 hours)      Critically ill patients:  140 - 180 mg/dL   Results for Billy Liu, Billy Liu (MRN [id number]) as of 10/10/2017 08:12  Ref. Range 10/08/2017 23:35 10/09/2017 04:33 10/09/2017 07:09 10/09/2017 12:20 10/09/2017 16:18 10/09/2017 19:55  Glucose-Capillary Latest Ref Range: 70 - 99 mg/dL 469 (H)  4 units NOVOLOG 168 (H)  4 units NOVOLOG 170 (H)  4 units NOVOLOG 174 (H)  4 units NOVOLOG 209 (H)  6 units NOVOLOG 229 (H)  6 units NOVOLOG    Results for Billy Liu, Billy Liu (MRN [id number]) as of 10/10/2017 08:12  Ref. Range 10/10/2017 00:29 10/10/2017 03:49 10/10/2017 07:33  Glucose-Capillary Latest Ref Range: 70 - 99 mg/dL 244 (H)  6 units NOVOLOG 219 (H)  6 units NOVOLOG 256 (H)  6 units NOVOLOG    Home DM Meds: Levemir 40 units BID        Current Orders: Novolog 2-4-6 Q4 hours per the ICU Glycemic Control Protocol      Patient remains Intubated.  Getting Vital High Protein Tube Feeds at 30cc/hr.  CBGs consistently >200 mg/dl since 4pm yesterday.    MD- Instead of starting the IV Insulin Drip per the ICU Glycemic Control Protocol, perhaps we could try the following:  1. Start Novolog Tube Feed Coverage: Novolog 4 units Q4 hours (Add the following HOLD parameters: HOLD if tube feeds Held for any reason)  2. May want to start a weight based dose of Levemir for patient:  Recommend Levemir 10 units BID (0.15 units/kg dosing based on weight of 126 kg)  (per records, patient taking Levemir 40 units BID at the SNF)     --Will follow patient during hospitalization--  Ambrose Finland RN, MSN, CDE Diabetes Coordinator Inpatient Glycemic Control Team Team Pager: [phone] (8a-5p)

## 2017-10-10 NOTE — Progress Notes (Signed)
SUBJECTIVE: Status post cardiac arrest, patient remains intubated and sedated.    Vitals:   10/10/17 0400 10/10/17 0500 10/10/17 0600 10/10/17 0815  BP: (!) 151/52 (!) 160/58 (!) 155/48   Pulse: 69 71 70   Resp: 16 16 (!) 0   Temp: 97.7 F (36.5 C)     TempSrc: Oral     SpO2: 99% 98% 100% 100%  Weight:      Height:        Intake/Output Summary (Last 24 hours) at 10/10/2017 0845 Last data filed at 10/10/2017 0601 Gross per 24 hour  Intake 3875.74 ml  Output 1100 ml  Net 2775.74 ml    LABS: Basic Metabolic Panel: Recent Labs    10/08/17 0423 10/09/17 0331 10/10/17 0340  NA 135 140 141  K 4.6 4.8 5.0  CL 103 106 110  CO2 26 26 26   GLUCOSE 122* 184* 255*  BUN 51* 48* 49*  CREATININE 1.49* 1.26* 1.19  CALCIUM 7.8* 8.0* 8.0*  MG 2.7*  --   --   PHOS 4.4  --   --    Liver Function Tests: No results for input(s): AST, ALT, ALKPHOS, BILITOT, PROT, ALBUMIN in the last 72 hours. No results for input(s): LIPASE, AMYLASE in the last 72 hours. CBC: Recent Labs    10/09/17 0331 10/10/17 0340  WBC 6.5 6.7  HGB 8.1* 8.5*  HCT 24.1* 26.0*  MCV 84.9 86.1  PLT 231 244   Cardiac Enzymes: Recent Labs    10/07/17 1436 10/07/17 2016  TROPONINI <0.03 <0.03   BNP: Invalid input(s): POCBNP D-Dimer: No results for input(s): DDIMER in the last 72 hours. Hemoglobin A1C: No results for input(s): HGBA1C in the last 72 hours. Fasting Lipid Panel: No results for input(s): CHOL, HDL, LDLCALC, TRIG, CHOLHDL, LDLDIRECT in the last 72 hours. Thyroid Function Tests: No results for input(s): TSH, T4TOTAL, T3FREE, THYROIDAB in the last 72 hours.  Invalid input(s): FREET3 Anemia Panel: No results for input(s): VITAMINB12, FOLATE, FERRITIN, TIBC, IRON, RETICCTPCT in the last 72 hours.   PHYSICAL EXAM General: Brother at bedside. Critically ill elderly gentleman lying in bed, intubated and unresponsive. Extremely swollen face and extremities. HEENT:  Normocephalic and  atramatic Neck:  No JVD.  Lungs: Diminished lung sounds bilaterally Heart: HRRR . Normal S1 and S2 without gallops or murmurs.  Abdomen: No bowel sounds.  Extremities: Extremely swollen legs, very tight and red.   Neuro: Intubated and sedated, no response  TELEMETRY: NSR 66bpm  ASSESSMENT AND PLAN: S/P cardiac arrest with respiratory failure and pneumonia. Pt remains intubated and sedated.  Echocardiogram shows right ventricular pressures increased consistent with moderate-severe pulmonary hypertension, normal LVEF mild diastolic dysfunction. Blood pressure show widening pulse pressures. Remains full code and family expresses hope that pt was showing improvements overnight. Continue to wean vent settings and sedation as tolerated.  Active Problems:   Cardiac arrest [facility])    Caroleen Hamman, NP-C 10/10/2017 8:45 AM Cell: [phone]

## 2017-10-10 NOTE — Progress Notes (Signed)
Daily Progress Note   Patient Name: Billy Liu       Date: 10/10/2017 DOB: 07/27/1932  Age: 84 y.o. MRN#: [id number] Attending Physician: Loletha Grayer, MD Primary Care Physician: Alvester Morin, MD Admit Date: 10/06/2017  Reason for Consultation/Follow-up: Psychosocial/spiritual support  Subjective: Patient is resting in bed on ventilator receiving EEG. Possible plans for MRI following the EEG. Per nursing, yesterday when sedation was stopped patient began flailing extremities. Today, sedation was discontinued and patient barely opened his eyes, and did not flail. No family at bedside.     Length of Stay: 3  Current Medications: Scheduled Meds:  . ALPRAZolam  0.5 mg Oral TID  . chlorhexidine gluconate (MEDLINE KIT)  15 mL Mouth Rinse BID  . feeding supplement (PRO-STAT SUGAR FREE 64)  60 mL Per Tube QID  . feeding supplement (VITAL HIGH PROTEIN)  1,000 mL Per Tube Q24H  . heparin  5,000 Units Subcutaneous Q8H  . Influenza vac split quadrivalent PF  0.5 mL Intramuscular Tomorrow-1000  . insulin aspart  2-6 Units Subcutaneous Q4H  . insulin aspart  4 Units Subcutaneous Q4H  . insulin detemir  10 Units Subcutaneous BID  . mouth rinse  15 mL Mouth Rinse 10 times per day  . multivitamin  15 mL Per Tube Daily  . polyethylene glycol  17 g Per Tube Daily  . senna-docusate  2 tablet Per Tube BID    Continuous Infusions: . sodium chloride 50 mL/hr at 10/10/17 0150  . famotidine (PEPCID) IV 20 mg (10/10/17 1130)  . fentaNYL infusion INTRAVENOUS 100 mcg/hr (10/09/17 2217)  . norepinephrine (LEVOPHED) Adult infusion Stopped (10/09/17 0722)  . propofol (DIPRIVAN) infusion 35 mcg/kg/min (10/10/17 0601)  . valproate sodium 250 mg (10/10/17 0215)    PRN Meds: acetaminophen  **OR** acetaminophen, atropine, bisacodyl, fentaNYL, HYDROcodone-acetaminophen, ondansetron **OR** ondansetron (ZOFRAN) IV, traZODone  Physical Exam  Constitutional: No distress.  Pulmonary/Chest:  Intubated            Vital Signs: BP (!) 153/51   Pulse 68   Temp 98 F (36.7 C) (Oral)   Resp (!) 0   Ht _0  (1.905 m)   Wt 126.9 kg   SpO2 100%   BMI 34.97 kg/m  SpO2: SpO2: 100 % O2 Device: O2 Device: Ventilator O2 Flow Rate:    Intake/output summary:  Intake/Output Summary (Last 24 hours) at 10/10/2017 1144 Last data filed at 10/10/2017 0900 Gross per 24 hour  Intake 4308.21 ml  Output 1100 ml  Net 3208.21 ml   LBM: Last BM Date: (prior to admit on 10/2) Baseline Weight: Weight: 129.5 kg(bed weight) Most recent weight: Weight: 126.9 kg       Palliative Assessment/Data: Intubated    Flowsheet Rows     Most Recent Value  Intake Tab  Referral Department  Hospitalist  Unit at Time of Referral  ICU  Palliative Care Primary Diagnosis  Cardiac  Date Notified  10/07/17  Palliative Care Type  New Palliative care  Reason for referral  Clarify Goals of Care  Date of Admission  10/06/17  Date first seen by Palliative Care  10/07/17  # of days Palliative referral response time  0 Day(s)  # of days IP prior to Palliative referral  1  Clinical Assessment  Psychosocial & Spiritual Assessment  Palliative Care Outcomes      Patient Active Problem List   Diagnosis Date Noted  . Cardiac arrest ([place]) 10/07/2017  . Iron deficiency anemia due to chronic blood loss   . Symptomatic anemia 06/17/2017  . Pressure injury of skin 06/17/2017  . Atherosclerosis of native arteries of extremity with intermittent claudication ([place]) 12/25/2016  . Chronic venous insufficiency 12/25/2016  . Lymphedema 10/23/2016  . Hyperlipidemia 10/23/2016  . Chronic diastolic heart failure ([place]) 11/10/2014  . Obstructive sleep apnea 11/10/2014  . CAD (coronary artery disease) 10/11/2014  . COPD  (chronic obstructive pulmonary disease) ([place]) 10/11/2014  . Type 2 diabetes mellitus ([place]) 10/11/2014  . HTN (hypertension) 10/11/2014  . GERD (gastroesophageal reflux disease) 10/11/2014  . CKD (chronic kidney disease), stage III ([place]) 10/11/2014    Palliative Care Assessment & Plan    Recommendations/Plan:  Continuing current care.     10/3 conversation: The brothers state they would want to try CPR once more in the case of cardiac arrest. They do not want a tracheostomy. They do not want a permanent feeding tube as this would not be an acceptable quality of life. At the point of maximized care with discontinued sedation and extubation, if he does not improve, discussed shift to comfort care.     Code Status:    Code Status Orders  (From admission, onward)         Start     Ordered   10/07/17 0132  Full code  Continuous     10/07/17 0131        Code Status History    Date Active Date Inactive Code Status Order ID Comments User Context   10/07/2017 0031 10/07/2017 0131 Full Code [id number]  Awilda Bill, NP ED   06/17/2017 1856 06/23/2017 2244 Full Code [id number]  Nicholes Mango, MD Inpatient   02/18/2017 1623 02/18/2017 2133 Full Code [id number]  Delana Meyer, Dolores Lory, MD Inpatient   02/11/2017 1138 02/11/2017 1736 Full Code [id number]  Schnier, Dolores Lory, MD Inpatient   10/24/2014 1934 10/31/2014 1837 Full Code [id number]  Fritzi Mandes, MD Inpatient   10/11/2014 2347 10/17/2014 1844 Full Code [id number]  Lance Coon, MD Inpatient       Prognosis:   Unable to determine  Discharge Planning:  To Be Determined  Care plan was discussed with RN  Thank you for allowing the Palliative Medicine Team to assist in the care of this patient.   Total Time 15 min Prolonged Time Billed  no      Greater than  50%  of this time was spent counseling and coordinating care related to the above assessment and plan.  Asencion Gowda, NP  Please contact Palliative Medicine Team phone at  [phone] for questions and concerns.

## 2017-10-10 NOTE — Progress Notes (Signed)
Pharmacy ICU Monitoring Consult:  Pharmacy consulted to assist in monitoring and replacing electrolytes in this 84 y.o. male admitted on 10/06/2017 with Cardiac Arrest   Labs:  Sodium (mmol/L)  Date Value  10/10/2017 141  07/04/2013 133 (L)   Potassium (mmol/L)  Date Value  10/10/2017 5.0  07/04/2013 4.3   Magnesium (mg/dL)  Date Value  16/10/9602 2.7 (H)   Phosphorus (mg/dL)  Date Value  54/09/8117 4.4   Calcium (mg/dL)  Date Value  14/78/2956 8.0 (L)   Calcium, Total (mg/dL)  Date Value  21/30/8657 8.7   Albumin (g/dL)  Date Value  84/69/6295 3.5  07/04/2013 3.6    Assessment/Plan: 1. Electrolytes: no replacement warranted. Potassium has trended up to 5 today. Will order electrolytes with morning labs to follow potassium.  2. Glucose mangagment: patient started on Levemir 10 units BID and Novolog 4 units Q4hr to cover tube feeds. Also has sliding scale. Will continue to monitor.   3. Constipation Management: daily Miralax and Senokot BID. Will continue to monitor and add as appropriate.   Pharmacy will continue to monitor and adjust per consult.   Pricilla Riffle, PharmD Pharmacy Resident  10/10/2017 11:37 AM

## 2017-10-10 NOTE — Consult Note (Signed)
Subjective: Patient remains intubated.  Sedation discontinued this morning.  Remains poorly responsive.    Objective: Current vital signs: BP (!) 153/51   Pulse 68   Temp 98 F (36.7 C) (Oral)   Resp (!) 0   Ht '6\' 3"'  (1.905 m)   Wt 126.9 kg   SpO2 100%   BMI 34.97 kg/m  Vital signs in last 24 hours: Temp:  [97.7 F (36.5 C)-98.9 F (37.2 C)] 98 F (36.7 C) (10/04 0800) Pulse Rate:  [64-84] 68 (10/04 0915) Resp:  [0-24] 0 (10/04 0915) BP: (130-162)/(43-58) 153/51 (10/04 0900) SpO2:  [94 %-100 %] 100 % (10/04 0915) FiO2 (%):  [30 %] 30 % (10/04 0815) Weight:  [126.9 kg] 126.9 kg (10/04 0239)  Intake/Output from previous day: 10/03 0701 - 10/04 0700 In: 4443.5 [P.O.:610; I.V.:2243; NG/GT:1080.8; IV Piggyback:509.8] Out: 1100 [Urine:1100] Intake/Output this shift: Total I/O In: 192.2 [I.V.:192.2] Out: -  Nutritional status:  Diet Order            Diet NPO time specified  Diet effective now              Neurologic Exam: Mental Status: Patient does not respond to verbal stimuli.  Localizes to deep sternal rub.  Does not follow commands.  No verbalizations noted.  Cranial Nerves: II: Pupils 74m and reactive bilaterally III,IV,VI: doll's response present bilaterally although neck stiff V,VII: corneal reflex present bilaterally  VIII: patient does not respond to verbal stimuli IX,X: gag reflex not tested XI: trapezius strength unable to test bilaterally XII: tongue strength unable to test Motor: Patient withdraws extremities weakly Sensory: Responds to noxious stimuli throughout Plantars: absent bilaterally  Lab Results: Basic Metabolic Panel: Recent Labs  Lab 10/07/17 0127  10/07/17 0806 10/07/17 2016 10/08/17 0423 10/09/17 0331 10/10/17 0340  NA  --   --  136 137 135 140 141  K  --    < > 4.4 4.6 4.6 4.8 5.0  CL  --   --  101 105 103 106 110  CO2  --   --  '26 26 26 26 26  ' GLUCOSE  --   --  69* 98 122* 184* 255*  BUN  --   --  59* 51* 51* 48* 49*   CREATININE  --   --  1.60* 1.46* 1.49* 1.26* 1.19  CALCIUM  --   --  8.0* 7.9* 7.8* 8.0* 8.0*  MG 2.8*  --   --   --  2.7*  --   --   PHOS 5.0*  --   --   --  4.4  --   --    < > = values in this interval not displayed.    Liver Function Tests: Recent Labs  Lab 10/06/17 2016  AST 51*  ALT 25  ALKPHOS 91  BILITOT 0.5  PROT 7.0  ALBUMIN 3.5   No results for input(s): LIPASE, AMYLASE in the last 168 hours. No results for input(s): AMMONIA in the last 168 hours.  CBC: Recent Labs  Lab 10/06/17 2016 10/07/17 0606 10/08/17 0423 10/08/17 1120 10/09/17 0331 10/10/17 0340  WBC 9.4 5.7 6.7  --  6.5 6.7  NEUTROABS 6.3 4.5  --   --   --   --   HGB 6.3* 6.5* 6.9* 8.3* 8.1* 8.5*  HCT 20.4* 19.7* 20.7* 24.9* 24.1* 26.0*  MCV 85.6 82.5 83.7  --  84.9 86.1  PLT 333 224 238  --  231 244    Cardiac Enzymes:  Recent Labs  Lab 10/06/17 2016 10/07/17 0127 10/07/17 0806 10/07/17 1436 10/07/17 2016  TROPONINI <0.03 <0.03 <0.03 <0.03 <0.03    Lipid Panel: No results for input(s): CHOL, TRIG, HDL, CHOLHDL, VLDL, LDLCALC in the last 168 hours.  CBG: Recent Labs  Lab 10/09/17 1618 10/09/17 1955 10/10/17 0029 10/10/17 0349 10/10/17 0733  GLUCAP 209* 229* 247* 219* 256*    Microbiology: Results for orders placed or performed during the hospital encounter of 10/06/17  Blood culture (routine x 2)     Status: None (Preliminary result)   Collection Time: 10/06/17 10:43 PM  Result Value Ref Range Status   Specimen Description BLOOD LEFT HAND   Final   Special Requests   Final    BOTTLES DRAWN AEROBIC AND ANAEROBIC Blood Culture adequate volume   Culture   Final    NO GROWTH 4 DAYS Performed at [facility], [address], [place], [place] [address]    Report Status PENDING  Incomplete  Blood culture (routine x 2)     Status: None (Preliminary result)   Collection Time: 10/06/17 10:44 PM  Result Value Ref Range Status   Specimen Description BLOOD BLOOD LEFT  FOREARM  Final   Special Requests   Final    BOTTLES DRAWN AEROBIC AND ANAEROBIC Blood Culture adequate volume   Culture   Final    NO GROWTH 4 DAYS Performed at [facility], [address] [place], [place] [address]    Report Status PENDING  Incomplete  MRSA PCR Screening     Status: None   Collection Time: 10/07/17  1:07 AM  Result Value Ref Range Status   MRSA by PCR NEGATIVE NEGATIVE Final    Comment:        The GeneXpert MRSA Assay (FDA approved for NASAL specimens only), is one component of a comprehensive MRSA colonization surveillance program. It is not intended to diagnose MRSA infection nor to guide or monitor treatment for MRSA infections. Performed at [facility], [address], [place], [place] [address]   Urine Culture     Status: None   Collection Time: 10/07/17  2:24 AM  Result Value Ref Range Status   Specimen Description   Final    URINE, RANDOM Performed at [facility], [address], [place], [place] [address]    Special Requests   Final    NONE Performed at [facility], [address], [place], [place] [address]    Culture   Final    NO GROWTH Performed at [facility], [place] [address] [place], [place] [address]    Report Status 10/08/2017 FINAL  Final  Aerobic/Anaerobic Culture (surgical/deep wound)     Status: Abnormal (Preliminary result)   Collection Time: 10/07/17  2:24 AM  Result Value Ref Range Status   Specimen Description TOE LEFT  Final   Special Requests PENDING  Incomplete   Gram Stain   Final    RARE SQUAMOUS EPITHELIAL CELLS PRESENT NO WBC SEEN MODERATE GRAM POSITIVE RODS FEW GRAM POSITIVE COCCI RARE GRAM NEGATIVE RODS Performed at [facility], [address] [place], [place] [address]    Culture (A)  Final    MULTIPLE ORGANISMS PRESENT, NONE PREDOMINANT NO ANAEROBES ISOLATED; CULTURE IN PROGRESS FOR 5 DAYS    Report Status PENDING  Incomplete  Aerobic/Anaerobic  Culture (surgical/deep wound)     Status: Abnormal (Preliminary result)   Collection Time: 10/07/17  2:24 AM  Result Value Ref  Range Status   Specimen Description   Final    LEG RIGHT Performed at [facility] [address] [place], [place] [address]    Special Requests   Final    NONE Performed at [facility], [place], [place] [address]    Gram Stain   Final    NO WBC SEEN MODERATE GRAM NEGATIVE RODS FEW GRAM POSITIVE RODS RARE GRAM POSITIVE COCCI Performed at [facility], [place] [address] [place], [place] [address]    Culture (A)  Final    MULTIPLE ORGANISMS PRESENT, NONE PREDOMINANT NO ANAEROBES ISOLATED; CULTURE IN PROGRESS FOR 5 DAYS    Report Status PENDING  Incomplete    Coagulation Studies: No results for input(s): LABPROT, INR in the last 72 hours.  Imaging: Ct Head Wo Contrast  Result Date: 10/09/2017 CLINICAL DATA:  Question anoxic brain injury.  Cardiac arrest EXAM: CT HEAD WITHOUT CONTRAST TECHNIQUE: Contiguous axial images were obtained from the base of the skull through the vertex without intravenous contrast. 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 Small remote lateral left frontal infarct best seen on reformats. There is generalized atrophy. Small remote left cerebellar infarct. No detected acute infarct or swelling. No hemorrhage, hydrocephalus, or collection. Vascular: Atherosclerotic calcification.  No hyperdense vessel. Skull: Remote left frontal craniotomy.  No acute finding Sinuses/Orbits: No acute finding IMPRESSION: 1. No evident anoxic injury. 2. 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 Electronically Signed   By: Monte Fantasia M.D.   On: 10/09/2017 14:46   Dg Chest Port 1 View  Result Date: 10/09/2017 CLINICAL DATA:  Acute respiratory failure. EXAM: PORTABLE CHEST 1 VIEW COMPARISON:  Radiograph 10/07/2017 FINDINGS: Endotracheal tube 4.4 cm from  the carina. Enteric tube in place with tip and side-port below the diaphragm. Cardiomegaly is unchanged. Worsening hazy opacities throughout both lungs. Bibasilar volume loss again seen with associated effusions. No pneumothorax. IMPRESSION: Worsening lung aeration with increasing hazy opacities throughout both lungs, likely combination of pleural fluid, atelectasis, and pulmonary edema. Electronically Signed   By: Keith Rake M.D.   On: 10/09/2017 04:06    Medications:  I have reviewed the patient's current medications. Prior to Admission:  Medications Prior to Admission  Medication Sig Dispense Refill Last Dose  . acetaminophen (TYLENOL) 500 MG tablet Take 500 mg by mouth 3 (three) times daily.   10/06/2017 at 1709  . amLODipine (NORVASC) 10 MG tablet Take 10 mg by mouth daily.   10/06/2017 at 0953  . aspirin EC 81 MG tablet Take 81 mg by mouth daily.   10/06/2017 at 0953  . Cholecalciferol (VITAMIN D) 2000 units tablet Take 2,000 Units by mouth daily.   10/06/2017 at 0953  . cloNIDine (CATAPRES) 0.2 MG tablet Take 1 tablet (0.2 mg total) by mouth 3 (three) times daily. 60 tablet 11 10/06/2017 at 1320  . divalproex (DEPAKOTE) 250 MG DR tablet Take 250 mg by mouth daily.    10/06/2017 at 0930  . divalproex (DEPAKOTE) 500 MG DR tablet Take 500 mg by mouth at bedtime.   10/05/2017 at 2226  . ferrous sulfate 324 (65 Fe) MG TBEC Take 324 mg by mouth daily.   10/06/2017 at 0953  . finasteride (PROSCAR) 5 MG tablet Take 1 tablet (5 mg total) by mouth daily.   10/06/2017 at 0953  . glucagon, human recombinant, (GLUCAGEN DIAGNOSTIC) 1 MG injection Inject 1 mg into the muscle once as needed  for low blood sugar.   07/07/2017 at 1222  . hydrALAZINE (APRESOLINE) 100 MG tablet Take 100 mg by mouth 3 (three) times daily.   10/06/2017 at 1320  . insulin detemir (LEVEMIR) 100 UNIT/ML injection Inject 40 Units into the skin 2 (two) times daily.    10/06/2017 at 0953  . metoprolol succinate (TOPROL-XL) 100 MG 24 hr  tablet Take 300 mg by mouth daily.    10/06/2017 at 0953  . pantoprazole (PROTONIX) 40 MG tablet Take 1 tablet (40 mg total) by mouth daily. 30 tablet 0 10/06/2017 at 0606  . polyethylene glycol (MIRALAX / GLYCOLAX) packet Take 17 g by mouth daily.    10/06/2017 at 0953  . senna (SENOKOT) 8.6 MG TABS tablet Take 2 tablets by mouth at bedtime.   10/05/2017 at 2226  . sertraline (ZOLOFT) 25 MG tablet Take 75 mg by mouth daily.    10/06/2017 at 0953  . simvastatin (ZOCOR) 20 MG tablet Take 20 mg by mouth at bedtime.    10/05/2017 at 2226  . Sunscreens (AVEENO DAILY MOISTURIZER) LOTN Apply topically at bedtime. Apply to bilateral feet and legs   10/05/2017 at 2254  . torsemide (DEMADEX) 20 MG tablet Take 2 tablets (40 mg total) by mouth daily.   10/06/2017 at 0953  . vitamin C (ASCORBIC ACID) 500 MG tablet Take 500 mg by mouth daily.   10/06/2017 at 0953  . omeprazole (PRILOSEC) 40 MG capsule Take 1 capsule (40 mg total) by mouth 2 (two) times daily for 14 days. 28 capsule 0    Scheduled: . ALPRAZolam  0.5 mg Oral TID  . chlorhexidine gluconate (MEDLINE KIT)  15 mL Mouth Rinse BID  . feeding supplement (PRO-STAT SUGAR FREE 64)  60 mL Per Tube QID  . feeding supplement (VITAL HIGH PROTEIN)  1,000 mL Per Tube Q24H  . furosemide  40 mg Intravenous Once  . heparin  5,000 Units Subcutaneous Q8H  . Influenza vac split quadrivalent PF  0.5 mL Intramuscular Tomorrow-1000  . insulin aspart  2-6 Units Subcutaneous Q4H  . mouth rinse  15 mL Mouth Rinse 10 times per day  . multivitamin  15 mL Per Tube Daily  . polyethylene glycol  17 g Per Tube Daily  . senna-docusate  2 tablet Per Tube BID   Patient seen and examined.  Clinical course and management discussed.  Necessary edits performed.  I agree with the above.  Assessment and plan of care developed and discussed below.    Assessment: Patient off sedation.  Some responses noted but remains far from baseline.    Recommendations: 1. EEG 2. MRI brain without  contrast 3. Continue off sedation as long as possible   LOS: 3 days   10/10/2017  10:15 AM  Alexis Goodell, MD Neurology [phone]  10/10/2017  12:18 PM

## 2017-10-11 ENCOUNTER — Inpatient Hospital Stay: Payer: Medicare Other

## 2017-10-11 LAB — CBC WITH DIFFERENTIAL/PLATELET
BASOS PCT: 0 %
Basophils Absolute: 0 10*3/uL (ref 0–0.1)
Eosinophils Absolute: 0.3 10*3/uL (ref 0–0.7)
Eosinophils Relative: 3 %
HCT: 27.8 % — ABNORMAL LOW (ref 40.0–52.0)
Hemoglobin: 8.5 g/dL — ABNORMAL LOW (ref 13.0–18.0)
Lymphocytes Relative: 6 %
Lymphs Abs: 0.5 10*3/uL — ABNORMAL LOW (ref 1.0–3.6)
MCH: 26.3 pg (ref 26.0–34.0)
MCHC: 30.6 g/dL — ABNORMAL LOW (ref 32.0–36.0)
MCV: 85.8 fL (ref 80.0–100.0)
MONO ABS: 1 10*3/uL (ref 0.2–1.0)
MONOS PCT: 11 %
Neutro Abs: 7.6 10*3/uL — ABNORMAL HIGH (ref 1.4–6.5)
Neutrophils Relative %: 80 %
PLATELETS: 267 10*3/uL (ref 150–440)
RBC: 3.24 MIL/uL — ABNORMAL LOW (ref 4.40–5.90)
RDW: 19 % — ABNORMAL HIGH (ref 11.5–14.5)
WBC: 9.5 10*3/uL (ref 3.8–10.6)

## 2017-10-11 LAB — BASIC METABOLIC PANEL
Anion gap: 5 (ref 5–15)
BUN: 45 mg/dL — ABNORMAL HIGH (ref 8–23)
CALCIUM: 8.4 mg/dL — AB (ref 8.9–10.3)
CO2: 28 mmol/L (ref 22–32)
CREATININE: 0.95 mg/dL (ref 0.61–1.24)
Chloride: 111 mmol/L (ref 98–111)
GFR calc non Af Amer: >60 mL/min (ref >60)
Glucose, Bld: 253 mg/dL — ABNORMAL HIGH (ref 70–99)
Potassium: 5 mmol/L (ref 3.5–5.1)
Sodium: 144 mmol/L (ref 135–145)

## 2017-10-11 LAB — GLUCOSE, CAPILLARY
GLUCOSE-CAPILLARY: 179 mg/dL — AB (ref 70–99)
GLUCOSE-CAPILLARY: 198 mg/dL — AB (ref 70–99)
GLUCOSE-CAPILLARY: 223 mg/dL — AB (ref 70–99)
GLUCOSE-CAPILLARY: 249 mg/dL — AB (ref 70–99)
Glucose-Capillary: 202 mg/dL — ABNORMAL HIGH (ref 70–99)
Glucose-Capillary: 218 mg/dL — ABNORMAL HIGH (ref 70–99)
Glucose-Capillary: 243 mg/dL — ABNORMAL HIGH (ref 70–99)

## 2017-10-11 LAB — CULTURE, BLOOD (ROUTINE X 2)
Culture: NO GROWTH
Culture: NO GROWTH
SPECIAL REQUESTS: ADEQUATE
Special Requests: ADEQUATE

## 2017-10-11 MED ORDER — MIDAZOLAM HCL 2 MG/2ML IJ SOLN
2.0000 mg | Freq: Once | INTRAMUSCULAR | Status: AC
  Administered 2017-10-11: 2 mg via INTRAVENOUS
  Filled 2017-10-11: qty 2

## 2017-10-11 MED ORDER — SODIUM CHLORIDE 0.9 % IV SOLN
INTRAVENOUS | Status: DC | PRN
  Administered 2017-10-11 – 2017-10-19 (×3): 250 mL via INTRAVENOUS
  Administered 2017-10-20: 08:00:00 via INTRAVENOUS
  Administered 2017-10-25: 250 mL via INTRAVENOUS
  Administered 2017-10-26: 1000 mL via INTRAVENOUS

## 2017-10-11 MED ORDER — DEXMEDETOMIDINE HCL IN NACL 400 MCG/100ML IV SOLN
0.0000 ug/kg/h | INTRAVENOUS | Status: DC
  Administered 2017-10-11: 0.8 ug/kg/h via INTRAVENOUS
  Administered 2017-10-12 (×2): 0.6 ug/kg/h via INTRAVENOUS
  Administered 2017-10-12 – 2017-10-13 (×3): 0.8 ug/kg/h via INTRAVENOUS
  Administered 2017-10-13: 1.2 ug/kg/h via INTRAVENOUS
  Administered 2017-10-13 – 2017-10-14 (×6): 1 ug/kg/h via INTRAVENOUS
  Filled 2017-10-11 (×17): qty 100

## 2017-10-11 MED ORDER — SODIUM CHLORIDE 0.9 % IV BOLUS: 1000.0000 mL | Freq: Once | INTRAVENOUS | Status: DC

## 2017-10-11 NOTE — Progress Notes (Addendum)
Follow up - Critical Care Medicine Note  Patient Details:    Billy Liu is an 84 y.o. male.  With past medical history remarkable for hypertension, hyperlipidemia, gastroesophageal reflux disease, depression, Alzheimer's, coronary artery disease, COPD, stage III renal failure, chronic diastolic heart failure,  admitted with acute on chronic renal failure,  status post cardiac arrest, mechanically intubated, hypothermic protocol initiated @36  degrees C.   Lines, Airways, Drains: Airway 7.5 mm (Active)  Secured at (cm) 24 cm 10/10/2017  8:15 AM  Measured From Lips 10/10/2017  8:15 AM  Secured Location Left 10/10/2017  8:15 AM  Secured By [employer] 10/10/2017  8:15 AM  Tube Holder Repositioned Yes 10/10/2017  8:15 AM  Cuff Pressure (cm H2O) 28 cm H2O 10/10/2017  8:15 AM  Site Condition Dry 10/10/2017  8:15 AM     NG/OG Tube Orogastric Left mouth Xray Documented cm marking at nare/ corner of mouth 76 cm (Active)  Cm Marking at Nare/Corner of Mouth (if applicable) 70 cm 10/10/2017  8:00 AM  Site Assessment Clean;Dry;Intact 10/10/2017  8:00 AM  Ongoing Placement Verification No change in cm markings or external length of tube from initial placement;No acute changes, not attributed to clinical condition;No change in respiratory status 10/10/2017  8:00 AM  Status Infusing tube feed 10/10/2017  8:00 AM  Intake (mL) 120 mL 10/08/2017  6:14 PM     Urethral Catheter Urology MD Coude 16 Fr. (Active)  Indication for Insertion or Continuance of Catheter Unstable critical patients (first 24-48 hours);Bladder outlet obstruction / other urologic reason 10/10/2017  8:00 AM  Site Assessment Intact;Swelling;Edema 10/08/2017  7:45 AM  Catheter Maintenance Bag below level of bladder;Catheter secured;Drainage bag/tubing not touching floor;Insertion date on drainage bag;No dependent loops;Seal intact 10/10/2017  8:00 AM  Collection Container Standard drainage bag 10/10/2017  8:00 AM  Securement Method Tape  10/10/2017  8:00 AM  Urinary Catheter Interventions Unclamped 10/10/2017  8:00 AM  Output (mL) 1100 mL 10/10/2017  6:01 AM    Anti-infectives:  Anti-infectives (From admission, onward)   Start     Dose/Rate Route Frequency Ordered Stop   10/10/17 2200  ceFEPIme (MAXIPIME) 2 g in sodium chloride 0.9 % 100 mL IVPB     2 g 200 mL/hr over 30 Minutes Intravenous Every 12 hours 10/10/17 1557 10/13/17 2159   10/07/17 0600  ceFEPIme (MAXIPIME) 2 g in sodium chloride 0.9 % 100 mL IVPB  Status:  Discontinued     2 g 200 mL/hr over 30 Minutes Intravenous Every 24 hours 10/07/17 0050 10/07/17 0210   10/07/17 0600  vancomycin (VANCOCIN) 1,500 mg in sodium chloride 0.9 % 500 mL IVPB  Status:  Discontinued     1,500 mg 250 mL/hr over 120 Minutes Intravenous Every 24 hours 10/07/17 0050 10/07/17 1136   10/07/17 0600  ceFEPIme (MAXIPIME) 2 g in sodium chloride 0.9 % 100 mL IVPB  Status:  Discontinued     2 g 200 mL/hr over 30 Minutes Intravenous Every 12 hours 10/07/17 0210 10/09/17 1124   10/06/17 2130  ceFEPIme (MAXIPIME) 1 g in sodium chloride 0.9 % 100 mL IVPB     1 g 200 mL/hr over 30 Minutes Intravenous  Once 10/06/17 2126 10/07/17 0016   10/06/17 2130  vancomycin (VANCOCIN) IVPB 1000 mg/200 mL premix     1,000 mg 200 mL/hr over 60 Minutes Intravenous  Once 10/06/17 2126 10/07/17 0115      Microbiology: Results for orders placed or performed during the hospital encounter of 10/06/17  Blood culture (routine x 2)     Status: None (Preliminary result)   Collection Time: 10/06/17 10:43 PM  Result Value Ref Range Status   Specimen Description BLOOD LEFT HAND   Final   Special Requests   Final    BOTTLES DRAWN AEROBIC AND ANAEROBIC Blood Culture adequate volume   Culture   Final    NO GROWTH 4 DAYS Performed at [facility], [address] [place], [place] [address]    Report Status PENDING  Incomplete  Blood culture (routine x 2)     Status: None (Preliminary result)   Collection  Time: 10/06/17 10:44 PM  Result Value Ref Range Status   Specimen Description BLOOD BLOOD LEFT FOREARM  Final   Special Requests   Final    BOTTLES DRAWN AEROBIC AND ANAEROBIC Blood Culture adequate volume   Culture   Final    NO GROWTH 4 DAYS Performed at [facility], [address], [place], [place] [address]    Report Status PENDING  Incomplete  MRSA PCR Screening     Status: None   Collection Time: 10/07/17  1:07 AM  Result Value Ref Range Status   MRSA by PCR NEGATIVE NEGATIVE Final    Comment:        The GeneXpert MRSA Assay (FDA approved for NASAL specimens only), is one component of a comprehensive MRSA colonization surveillance program. It is not intended to diagnose MRSA infection nor to guide or monitor treatment for MRSA infections. Performed at [facility], [address] [place], [place] [address]   Urine Culture     Status: None   Collection Time: 10/07/17  2:24 AM  Result Value Ref Range Status   Specimen Description   Final    URINE, RANDOM Performed at [facility], [address], [place], [place] [address]    Special Requests   Final    NONE Performed at [facility], [address], [place], [place] [address]    Culture   Final    NO GROWTH Performed at [facility], [address], [place], [place] [address]    Report Status 10/08/2017 FINAL  Final  Aerobic/Anaerobic Culture (surgical/deep wound)     Status: Abnormal (Preliminary result)   Collection Time: 10/07/17  2:24 AM  Result Value Ref Range Status   Specimen Description TOE LEFT  Final   Special Requests PENDING  Incomplete   Gram Stain   Final    RARE SQUAMOUS EPITHELIAL CELLS PRESENT NO WBC SEEN MODERATE GRAM POSITIVE RODS FEW GRAM POSITIVE COCCI RARE GRAM NEGATIVE RODS Performed at [facility], [address], [place], [place] [address]    Culture (A)  Final    MULTIPLE ORGANISMS PRESENT, NONE PREDOMINANT FEW STAPHYLOCOCCUS  AUREUS WITHIN MIXED CULTURE NO ANAEROBES ISOLATED; CULTURE IN PROGRESS FOR 5 DAYS    Report Status PENDING  Incomplete  Aerobic/Anaerobic Culture (surgical/deep wound)     Status: Abnormal (Preliminary result)   Collection Time: 10/07/17  2:24 AM  Result Value Ref Range Status   Specimen Description   Final    LEG RIGHT Performed at [facility], [address], [place], [place] [address]    Special Requests   Final    NONE Performed at [facility], [address] [place], [place] [address]    Gram Stain   Final    NO WBC SEEN MODERATE GRAM NEGATIVE RODS FEW GRAM POSITIVE RODS RARE Romie Minus  POSITIVE COCCI Performed at [facility], [address] [place], [place] [address]    Culture (A)  Final    MULTIPLE ORGANISMS PRESENT, NONE PREDOMINANT NO ANAEROBES ISOLATED; CULTURE IN PROGRESS FOR 5 DAYS    Report Status PENDING  Incomplete   Studies: Dg Chest 1 View  Result Date: 10/07/2017 CLINICAL DATA:  Feeding tube, evaluate placement of endotracheal tube EXAM: CHEST  1 VIEW COMPARISON:  Portable chest x-ray of 10/06/2016 FINDINGS: The tip of the endotracheal tube is approximately 3.9 cm above the carina. The lungs remain poorly aerated and cardiomegaly is stable. Haziness at both lung bases may reflect atelectasis and effusion although pneumonia cannot be excluded. NG tube extends below the hemidiaphragm. IMPRESSION: 1. Endotracheal tube tip 3.9 cm above the carina. 2. Poor aeration with basilar volume loss, probable effusions, and pneumonia cannot be excluded. 3. Stable cardiomegaly. 4. NG tube extends below the hemidiaphragm. Electronically Signed   By: Dwyane Dee M.D.   On: 10/07/2017 14:03   Dg Abd 1 View  Result Date: 10/07/2017 CLINICAL DATA:  Evaluate position of feeding tube EXAM: ABDOMEN - 1 VIEW COMPARISON:  Abdomen 10/07/2016 FINDINGS: A tip of the feeding tube is seen within the fundus dex proximal body of the stomach. Bowel gas pattern is nonspecific.  Some volume loss is again noted at the lung bases and cardiomegaly is stable IMPRESSION: Feeding tube tip extends to the region of the fundus-proximal stomach. The bowel gas pattern is nonspecific Electronically Signed   By: Dwyane Dee M.D.   On: 10/07/2017 14:04   Dg Abd 1 View  Result Date: 10/07/2017 CLINICAL DATA:  84 year old male with nasogastric tube placement. Subsequent encounter. EXAM: ABDOMEN - 1 VIEW COMPARISON:  10/07/2017 2:36 a.m. FINDINGS: Nasogastric tube radiopaque marker can only be well delineated to the level of the distal esophagus. Tip of the nasogastric tube may enter the stomach but is not adequately assessed secondary to overlying leads in the left upper quadrant. Removal of leads or change of lead position and repeat exam may prove helpful for further delineation. Nonspecific bowel gas pattern. IMPRESSION: Nasogastric tube radiopaque marker can only be well delineated to the level of the distal esophagus. Tip of the nasogastric tube may enter the stomach but is not adequately assessed secondary to overlying leads in the left upper quadrant. Removal of leads or change of lead position and repeat exam may prove helpful for further delineation. These results will be called to the ordering clinician or representative by the Radiologist Assistant, and communication documented in the PACS or zVision Dashboard. Electronically Signed   By: Lacy Duverney M.D.   On: 10/07/2017 08:32   Dg Abd 1 View  Result Date: 10/07/2017 CLINICAL DATA:  OG tube placement, second attempt EXAM: ABDOMEN - 1 VIEW COMPARISON:  10/07/2017 at 0228 hours FINDINGS: Enteric tube terminates in the gastric cardia with its side port at/just above the GE junction. IMPRESSION: Enteric tube terminates in the gastric cardia with its side port at/just above the GE junction. Electronically Signed   By: Charline Bills M.D.   On: 10/07/2017 02:48   Dg Abd 1 View  Result Date: 10/07/2017 CLINICAL DATA:  OG tube  placement EXAM: ABDOMEN - 1 VIEW COMPARISON:  10/06/2017 FINDINGS: Enteric tube terminates at the GE junction with the side port in the distal esophagus. IMPRESSION: Enteric tube terminates at the GE junction with the side port in the distal esophagus. Electronically Signed   By: Roselie Awkward.D.  On: 10/07/2017 02:47   Dg Abd 1 View  Result Date: 10/06/2017 CLINICAL DATA:  OG tube EXAM: ABDOMEN - 1 VIEW COMPARISON:  None. FINDINGS: By history patient has an OG tube. Just to the RIGHT of the thoracic spine, there is a radiopaque line, possibly an orogastric tube. However this is slightly to the RIGHT of the expected course of the esophagus and does not extend into the LEFT UPPER QUADRANT. It is possible that orogastric tube has been withdrawn or removed and not included on the film. There is dense opacification of the LEFT lung base. Visualized bowel gas pattern is nonobstructive. IMPRESSION: Possible orogastric tube to the distal esophagus, but this appears slightly to the RIGHT of the expected course of the esophagus. Recommend confirmation of orogastric tube placement and consider advancing tube further into the stomach if this is felt to be the orogastric tube. Significant LEFT LOWER lobe opacity. Electronically Signed   By: Norva Pavlov M.D.   On: 10/06/2017 21:00   Ct Head Wo Contrast  Result Date: 10/09/2017 CLINICAL DATA:  Question anoxic brain injury.  Cardiac arrest EXAM: CT HEAD WITHOUT CONTRAST TECHNIQUE: Contiguous axial images were obtained from the base of the skull through the vertex without intravenous contrast. 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 Small remote lateral left frontal infarct best seen on reformats. There is generalized atrophy. Small remote left cerebellar infarct. No detected acute infarct or swelling. No hemorrhage, hydrocephalus, or collection. Vascular: Atherosclerotic calcification.  No hyperdense  vessel. Skull: Remote left frontal craniotomy.  No acute finding Sinuses/Orbits: No acute finding IMPRESSION: 1. No evident anoxic injury. 2. 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 Electronically Signed   By: Marnee Spring M.D.   On: 10/09/2017 14:46   Ct Head Wo Contrast  Result Date: 10/06/2017 CLINICAL DATA:  Altered mental status (AMS), unclear cause; C-spine trauma, high clinical risk (NEXUS/CCR). Collapsed in the bathroom. EXAM: CT HEAD WITHOUT CONTRAST CT CERVICAL SPINE WITHOUT CONTRAST TECHNIQUE: Multidetector CT imaging of the head and cervical spine was performed following the standard protocol without intravenous contrast. Multiplanar CT image reconstructions of the cervical spine were also generated. COMPARISON:  Head CT 06/17/2017 FINDINGS: CT HEAD FINDINGS Brain: Unchanged degree of atrophy and chronic small vessel ischemia. Encephalomalacia in the left parietal lobe again seen. No intracranial hemorrhage, mass effect, or midline shift. No hydrocephalus. The basilar cisterns are patent. No evidence of territorial infarct or acute ischemia. No extra-axial or intracranial fluid collection. Vascular: Atherosclerosis of skullbase vasculature without hyperdense vessel or abnormal calcification. Skull: Prior left frontoparietal craniotomy.  No skull fracture. Sinuses/Orbits: Paranasal sinuses and mastoid air cells are clear. The visualized orbits are unremarkable. Bilateral cataract resection. Other: None. CT CERVICAL SPINE FINDINGS Alignment: Straightening of normal cervical lordosis. Skull base and vertebrae: No acute fracture. Bulky anterior osteophytes from C3-C4 through C6-C7, well corticated lucency through the osteophytes at C4 and C5 may represent remote prior trauma. The dens and skull base are intact. Soft tissues and spinal canal: Debris in the hypopharynx limits assessment for prevertebral edema, no gross prevertebral soft tissue thickening. No evidence of canal hematoma. Disc  levels: Near complete disc space loss at C2-C3 with bony ankylosis of the posterior elements on the left. Disc space narrowing at C5-C6 and C6-C7. 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 Bulky anterior osteophytes throughout, some of which are fragmented. Multilevel facet arthropathy. Upper chest: Moderate right pleural effusion, partially included. Other: Soft tissue prominence  involving the right aspect of the hypopharynx is incompletely included in the field of view, causing mild leftward endotracheal and enteric tube deviation. There are dense vascular calcifications. IMPRESSION: 1.  No acute intracranial abnormality.  No skull fracture. 2. Unchanged atrophy, chronic small vessel ischemia, and left parietal encephalomalacia. 3. Advanced multilevel degenerative change in the cervical spine without evidence of acute fracture. 4. Soft tissue prominence involving the right hypopharynx is only partially included in the field of view, this may represent retained secretions or asymmetric positioning of the patient's tongue, however underlying mass lesion is not excluded. Consider contrast enhanced CT of the neck as clinically indicated. 5. Moderate right pleural effusion partially included in the field of view. Electronically Signed   By: Narda Rutherford M.D.   On: 10/06/2017 21:38   Ct Cervical Spine Wo Contrast  Result Date: 10/06/2017 CLINICAL DATA:  Altered mental status (AMS), unclear cause; C-spine trauma, high clinical risk (NEXUS/CCR). Collapsed in the bathroom. EXAM: CT HEAD WITHOUT CONTRAST CT CERVICAL SPINE WITHOUT CONTRAST TECHNIQUE: Multidetector CT imaging of the head and cervical spine was performed following the standard protocol without intravenous contrast. Multiplanar CT image reconstructions of the cervical spine were also generated. COMPARISON:  Head CT 06/17/2017 FINDINGS: CT HEAD FINDINGS Brain: Unchanged degree of atrophy and chronic small vessel ischemia.  Encephalomalacia in the left parietal lobe again seen. No intracranial hemorrhage, mass effect, or midline shift. No hydrocephalus. The basilar cisterns are patent. No evidence of territorial infarct or acute ischemia. No extra-axial or intracranial fluid collection. Vascular: Atherosclerosis of skullbase vasculature without hyperdense vessel or abnormal calcification. Skull: Prior left frontoparietal craniotomy.  No skull fracture. Sinuses/Orbits: Paranasal sinuses and mastoid air cells are clear. The visualized orbits are unremarkable. Bilateral cataract resection. Other: None. CT CERVICAL SPINE FINDINGS Alignment: Straightening of normal cervical lordosis. Skull base and vertebrae: No acute fracture. Bulky anterior osteophytes from C3-C4 through C6-C7, well corticated lucency through the osteophytes at C4 and C5 may represent remote prior trauma. The dens and skull base are intact. Soft tissues and spinal canal: Debris in the hypopharynx limits assessment for prevertebral edema, no gross prevertebral soft tissue thickening. No evidence of canal hematoma. Disc levels: Near complete disc space loss at C2-C3 with bony ankylosis of the posterior elements on the left. Disc space narrowing at C5-C6 and C6-C7. 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 Bulky anterior osteophytes throughout, some of which are fragmented. Multilevel facet arthropathy. Upper chest: Moderate right pleural effusion, partially included. Other: Soft tissue prominence involving the right aspect of the hypopharynx is incompletely included in the field of view, causing mild leftward endotracheal and enteric tube deviation. There are dense vascular calcifications. IMPRESSION: 1.  No acute intracranial abnormality.  No skull fracture. 2. Unchanged atrophy, chronic small vessel ischemia, and left parietal encephalomalacia. 3. Advanced multilevel degenerative change in the cervical spine without evidence of acute fracture. 4. Soft  tissue prominence involving the right hypopharynx is only partially included in the field of view, this may represent retained secretions or asymmetric positioning of the patient's tongue, however underlying mass lesion is not excluded. Consider contrast enhanced CT of the neck as clinically indicated. 5. Moderate right pleural effusion partially included in the field of view. Electronically Signed   By: Narda Rutherford M.D.   On: 10/06/2017 21:38   Mr Brain Wo Contrast  Result Date: 10/10/2017 CLINICAL DATA:  Altered mental status. EXAM: MRI HEAD WITHOUT CONTRAST TECHNIQUE: Multiplanar, multiecho pulse sequences of the brain and surrounding structures  were obtained without intravenous contrast. COMPARISON:  Head CT 10/09/2017 FINDINGS: The study is mildly motion degraded. Brain: There is no evidence of acute infarct, intracranial hemorrhage, mass, midline shift, or extra-axial fluid collection. Small chronic infarcts are again noted in the left parietal lobe and left cerebellum. There is also minimal cortical encephalomalacia laterally in the left frontal lobe subjacent to the craniotomy. Bilateral periventricular white matter T2 hyperintensities are nonspecific but compatible with mild chronic small vessel ischemic disease. There is mild-to-moderate cerebral atrophy. Vascular: Major intracranial vascular flow voids are preserved. Skull and upper cervical spine: Left frontoparietal craniotomy. Upper cervical disc degeneration. Sinuses/Orbits: Bilateral cataract extraction. Mild bilateral maxillary and sphenoid sinus mucosal thickening. Large bilateral mastoid effusions and fluid in the pharynx in the setting of intubation. Other: None. IMPRESSION: 1. No acute intracranial abnormality. 2. Chronic left parietal, left frontal, and left cerebellar infarcts. 3. Mild chronic small vessel ischemia in the cerebral white matter. Electronically Signed   By: Sebastian Ache M.D.   On: 10/10/2017 16:52   Dg Pelvis  Portable  Result Date: 10/10/2017 CLINICAL DATA:  MRI screening for metal EXAM: PORTABLE PELVIS 1-2 VIEWS COMPARISON:  10/07/2017, 11/07/2009 radiographs FINDINGS: No metallic density radiopaque foreign body is seen within the included portions of the bony pelvis and overlying soft tissues. SI joints are non widened. There is a catheter or probe inferior to the pubic symphysis. No fracture or malalignment although right femoral neck is obscured by the trochanter. Phleboliths in the right pelvis. There may be chronic avulsion injury at the left ischium. IMPRESSION: Negative. Electronically Signed   By: Jasmine Pang M.D.   On: 10/10/2017 15:27   Dg Chest Port 1 View  Result Date: 10/11/2017 CLINICAL DATA:  Ventilator EXAM: PORTABLE CHEST 1 VIEW COMPARISON:  10/09/2017 FINDINGS: Endotracheal tube is 9 cm above the carina. Cardiomegaly with vascular congestion. Bilateral perihilar lower lobe opacities and layering effusions are similar prior study. NG tube is in the stomach. IMPRESSION: Bilateral layering effusions with cardiomegaly and bilateral perihilar and lower lobe airspace opacities, likely edema and/or atelectasis. Electronically Signed   By: Charlett Nose M.D.   On: 10/11/2017 08:26   Dg Chest Port 1 View  Result Date: 10/09/2017 CLINICAL DATA:  Acute respiratory failure. EXAM: PORTABLE CHEST 1 VIEW COMPARISON:  Radiograph 10/07/2017 FINDINGS: Endotracheal tube 4.4 cm from the carina. Enteric tube in place with tip and side-port below the diaphragm. Cardiomegaly is unchanged. Worsening hazy opacities throughout both lungs. Bibasilar volume loss again seen with associated effusions. No pneumothorax. IMPRESSION: Worsening lung aeration with increasing hazy opacities throughout both lungs, likely combination of pleural fluid, atelectasis, and pulmonary edema. Electronically Signed   By: Narda Rutherford M.D.   On: 10/09/2017 04:06   Dg Chest Portable 1 View  Result Date: 10/06/2017 CLINICAL DATA:   Ett tube EXAM: PORTABLE CHEST 1 VIEW COMPARISON:  06/20/2017 FINDINGS: Interval placement of endotracheal tube, tip approximately 3.0 centimeters above the carina. An orogastric tube is in place, tip overlying the level of the LOWER esophagus but difficult to confirm. The heart is enlarged. There is dense opacity in the LEFT lung base. Perihilar opacities are consistent with pulmonary edema. IMPRESSION: Interval placement of endotracheal tube and orogastric tube. LEFT LOWER lobe opacity and pulmonary edema. Electronically Signed   By: Norva Pavlov M.D.   On: 10/06/2017 21:02   Dg Abd Portable 1v  Result Date: 10/07/2017 CLINICAL DATA:  Orogastric tube placement. EXAM: PORTABLE ABDOMEN - 1 VIEW COMPARISON:  Radiograph of same  day. FINDINGS: The bowel gas pattern is normal. Distal tip of nasogastric tube is seen in proximal stomach, with side hole at expected position of gastroesophageal junction. No radio-opaque calculi or other significant radiographic abnormality are seen. IMPRESSION: Distal tip of nasogastric tube seen in proximal stomach, with side hole at expected position of gastroesophageal junction. Advancement is recommended. Electronically Signed   By: Lupita Raider, M.D.   On: 10/07/2017 12:18    Consults: Treatment Team:  Pccm, Raymond Gurney, MD Laurier Nancy, MD Kym Groom, MD   Subjective:    Overnight Issues: No significant changes in the last 24 hours, weaned off of sedation, status post EEG and MRI.  Appreciate neurology's consultation  Objective:  Vital signs for last 24 hours: Temp:  [97.9 F (36.6 C)-98.9 F (37.2 C)] 98.1 F (36.7 C) (10/05 0320) Pulse Rate:  [68-118] 106 (10/05 0700) Resp:  [0-22] 20 (10/05 0700) BP: (122-208)/(42-109) 156/52 (10/05 0700) SpO2:  [92 %-100 %] 95 % (10/05 0700) FiO2 (%):  [30 %] 30 % (10/05 0400) Weight:  [126.4 kg] 126.4 kg (10/05 0354)  Hemodynamic parameters for last 24 hours:    Intake/Output from previous  day: 10/04 0701 - 10/05 0700 In: 1945.2 [I.V.:924.8; NG/GT:779.2; IV Piggyback:241.2] Out: 4350 [Urine:4350]  Intake/Output this shift: No intake/output data recorded.  Vent settings for last 24 hours: Vent Mode: PRVC FiO2 (%):  [30 %] 30 % Set Rate:  [16 bmp] 16 bmp Vt Set:  [500 mL] 500 mL PEEP:  [5 cmH20] 5 cmH20 Plateau Pressure:  [18 cmH20-21 cmH20] 18 cmH20  Physical Exam:  Vital signs: Please see the above listed vital signs HEENT: Trachea midline, orally intubated, no accessory muscle utilization Cardiovascular: Regular rate and rhythm Abdominal: Positive bowel sounds, soft exam Extremities: No clubbing cyanosis, 1+ edema appreciated Neurologic: Patient is sedated, will track with eyes but no purposeful response  Assessment/Plan:   Status post cardiac arrest.  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  Being followed by neurology.  Post EEG and MRI, please see reports, patient is off sedation, appreciate neurology input  Hypoxemic/hypercapnic respiratory failure.  Patient remains on mechanical ventilation, weaning has been limited secondary to altered mental status post cardiac arrest.  Patient with apparent cuff leak, chest x-ray revealed endotracheal tube too high, advanced 3 cm with resolution  Renal failure.  BUN 45 /creatinine .95.  Anemia.  No evidence of active bleeding  Critical Care Total Time 40 minutes  Billy Liu 10/11/2017  *Care during the described time interval was provided by me and/or other providers on the critical care team.  I have reviewed this patient's available data, including medical history, events of note, physical examination and test results as part of my evaluation. Patient ID: Billy Liu, male   DOB: [birth year], 84 y.o.   MRN: [id number]

## 2017-10-11 NOTE — Progress Notes (Signed)
Patient ID: Billy Liu, male   DOB: 1932/10/09, 84 y.o.   MRN: [id number]  [facility] PROGRESS NOTE  Tiyon Sanor BDZ:[id number] DOB: 01-May-1932 DOA: 10/06/2017 PCP: Alvester Morin, MD  HPI/Subjective: This morning he is sedated  Objective: Vitals:   10/11/17 1135 10/11/17 1200  BP:    Pulse:    Resp:    Temp:  98.9 F (37.2 C)  SpO2: 96%     Filed Weights   10/09/17 0500 10/10/17 0239 10/11/17 0354  Weight: 132.1 kg 126.9 kg 126.4 kg    ROS: Review of Systems  Unable to perform ROS: Acuity of condition   Exam: Physical Exam  Constitutional: He appears lethargic. He is intubated.  HENT:  Nose: Mucosal edema present.  Unable to look into mouth  Eyes: Conjunctivae and lids are normal.  Pupils pinpoint  Neck: Carotid bruit is not present.  Cardiovascular: Regular rhythm, S1 normal, S2 normal and normal heart sounds.  Respiratory: He is intubated. He has decreased breath sounds in the right lower field and the left lower field. He has no wheezes. He has rhonchi in the right lower field and the left lower field.  GI: Bowel sounds are normal. There is no tenderness.  Musculoskeletal:       Right ankle: He exhibits swelling.       Left ankle: He exhibits swelling.  Neurological: He appears lethargic.  Opens eyes and moves arms with stimulation  Skin: Skin is warm.  Right posterior calf large area of greenish discoloration and foul-smelling in an ulcer.  Left foot first toe with small ulceration and very swollen first toe.  Likely from chronic infection  Psychiatric:  Opens eyes and moves his arms to stimulation      Data Reviewed: Basic Metabolic Panel: Recent Labs  Lab 10/07/17 0127  10/07/17 2016 10/08/17 0423 10/09/17 0331 10/10/17 0340 10/11/17 0516  NA  --    < > 137 135 140 141 144  K  --    < > 4.6 4.6 4.8 5.0 5.0  CL  --    < > 105 103 106 110 111  CO2  --    < > '26 26 26 26 28  ' GLUCOSE  --    < > 98 122* 184* 255* 253*  BUN   --    < > 51* 51* 48* 49* 45*  CREATININE  --    < > 1.46* 1.49* 1.26* 1.19 0.95  CALCIUM  --    < > 7.9* 7.8* 8.0* 8.0* 8.4*  MG 2.8*  --   --  2.7*  --   --   --   PHOS 5.0*  --   --  4.4  --   --   --    < > = values in this interval not displayed.   Liver Function Tests: Recent Labs  Lab 10/06/17 2016  AST 51*  ALT 25  ALKPHOS 91  BILITOT 0.5  PROT 7.0  ALBUMIN 3.5   CBC: Recent Labs  Lab 10/06/17 2016 10/07/17 0606 10/08/17 0423 10/08/17 1120 10/09/17 0331 10/10/17 0340 10/11/17 0516  WBC 9.4 5.7 6.7  --  6.5 6.7 9.5  NEUTROABS 6.3 4.5  --   --   --   --  7.6*  HGB 6.3* 6.5* 6.9* 8.3* 8.1* 8.5* 8.5*  HCT 20.4* 19.7* 20.7* 24.9* 24.1* 26.0* 27.8*  MCV 85.6 82.5 83.7  --  84.9 86.1 85.8  PLT 333 224 238  --  231 244  267   Cardiac Enzymes: Recent Labs  Lab 10/06/17 2016 10/07/17 0127 10/07/17 0806 10/07/17 1436 10/07/17 2016  TROPONINI <0.03 <0.03 <0.03 <0.03 <0.03    CBG: Recent Labs  Lab 10/10/17 1957 10/10/17 2348 10/11/17 0346 10/11/17 0754 10/11/17 1152  GLUCAP 267* 243* 202* 218* 249*    Recent Results (from the past 240 hour(s))  Blood culture (routine x 2)     Status: None   Collection Time: 10/06/17 10:43 PM  Result Value Ref Range Status   Specimen Description BLOOD LEFT HAND   Final   Special Requests   Final    BOTTLES DRAWN AEROBIC AND ANAEROBIC Blood Culture adequate volume   Culture   Final    NO GROWTH 5 DAYS Performed at [facility], [address] [place], [place] [address]    Report Status 10/11/2017 FINAL  Final  Blood culture (routine x 2)     Status: None   Collection Time: 10/06/17 10:44 PM  Result Value Ref Range Status   Specimen Description BLOOD BLOOD LEFT FOREARM  Final   Special Requests   Final    BOTTLES DRAWN AEROBIC AND ANAEROBIC Blood Culture adequate volume   Culture   Final    NO GROWTH 5 DAYS Performed at [facility], [address] [place], [place] [address]    Report Status  10/11/2017 FINAL  Final  MRSA PCR Screening     Status: None   Collection Time: 10/07/17  1:07 AM  Result Value Ref Range Status   MRSA by PCR NEGATIVE NEGATIVE Final    Comment:        The GeneXpert MRSA Assay (FDA approved for NASAL specimens only), is one component of a comprehensive MRSA colonization surveillance program. It is not intended to diagnose MRSA infection nor to guide or monitor treatment for MRSA infections. Performed at [facility], [address], [place], [place] [address]   Urine Culture     Status: None   Collection Time: 10/07/17  2:24 AM  Result Value Ref Range Status   Specimen Description   Final    URINE, RANDOM Performed at [facility], [address] [place], [place] [address]    Special Requests   Final    NONE Performed at [facility], [address] [place], [place] [address]    Culture   Final    NO GROWTH Performed at [facility], [place] [address] [place], [place] [address]    Report Status 10/08/2017 FINAL  Final  Aerobic/Anaerobic Culture (surgical/deep wound)     Status: None (Preliminary result)   Collection Time: 10/07/17  2:24 AM  Result Value Ref Range Status   Specimen Description TOE LEFT  Final   Special Requests PENDING  Incomplete   Gram Stain   Final    RARE SQUAMOUS EPITHELIAL CELLS PRESENT NO WBC SEEN MODERATE GRAM POSITIVE RODS FEW GRAM POSITIVE COCCI RARE GRAM NEGATIVE RODS Performed at [facility], [address], [place], [place] [address]    Culture   Final    FEW METHICILLIN RESISTANT STAPHYLOCOCCUS AUREUS WITHIN MIXED ORGANISMS NO ANAEROBES ISOLATED; CULTURE IN PROGRESS FOR 5 DAYS    Report Status PENDING  Incomplete   Organism ID, Bacteria METHICILLIN RESISTANT STAPHYLOCOCCUS AUREUS  Final      Susceptibility   Methicillin resistant staphylococcus aureus - MIC*    CIPROFLOXACIN <=0.5 SENSITIVE Sensitive     ERYTHROMYCIN >=8 RESISTANT Resistant  GENTAMICIN <=0.5 SENSITIVE Sensitive     OXACILLIN >=4 RESISTANT Resistant     TETRACYCLINE <=1 SENSITIVE Sensitive     VANCOMYCIN 1 SENSITIVE Sensitive     TRIMETH/SULFA <=10 SENSITIVE Sensitive     CLINDAMYCIN <=0.25 SENSITIVE Sensitive     RIFAMPIN <=0.5 SENSITIVE Sensitive     Inducible Clindamycin NEGATIVE Sensitive     * FEW METHICILLIN RESISTANT STAPHYLOCOCCUS AUREUS  Aerobic/Anaerobic Culture (surgical/deep wound)     Status: Abnormal (Preliminary result)   Collection Time: 10/07/17  2:24 AM  Result Value Ref Range Status   Specimen Description   Final    LEG RIGHT Performed at [facility], [place] [address] [place], [place] [address]    Special Requests   Final    NONE Performed at [facility], [place], [place] [address]    Gram Stain   Final    NO WBC SEEN MODERATE GRAM NEGATIVE RODS FEW GRAM POSITIVE RODS RARE GRAM POSITIVE COCCI Performed at [facility], [place] [address] [place], [place] [address]    Culture (A)  Final    MULTIPLE ORGANISMS PRESENT, NONE PREDOMINANT NO ANAEROBES ISOLATED; CULTURE IN PROGRESS FOR 5 DAYS    Report Status PENDING  Incomplete     Studies: Ct Head Wo Contrast  Result Date: 10/09/2017 CLINICAL DATA:  Question anoxic brain injury.  Cardiac arrest EXAM: CT HEAD WITHOUT CONTRAST TECHNIQUE: Contiguous axial images were obtained from the base of the skull through the vertex without intravenous contrast. 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 Small remote lateral left frontal infarct best seen on reformats. There is generalized atrophy. Small remote left cerebellar infarct. No detected acute infarct or swelling. No hemorrhage, hydrocephalus, or collection. Vascular: Atherosclerotic calcification.  No hyperdense vessel. Skull: Remote left frontal craniotomy.  No acute finding Sinuses/Orbits: No acute finding IMPRESSION: 1. No evident anoxic injury. 2.  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 Electronically Signed   By: Monte Fantasia M.D.   On: 10/09/2017 14:46   Mr Brain Wo Contrast  Result Date: 10/10/2017 CLINICAL DATA:  Altered mental status. EXAM: MRI HEAD WITHOUT CONTRAST TECHNIQUE: Multiplanar, multiecho pulse sequences of the brain and surrounding structures were obtained without intravenous contrast. COMPARISON:  Head CT 10/09/2017 FINDINGS: The study is mildly motion degraded. Brain: There is no evidence of acute infarct, intracranial hemorrhage, mass, midline shift, or extra-axial fluid collection. Small chronic infarcts are again noted in the left parietal lobe and left cerebellum. There is also minimal cortical encephalomalacia laterally in the left frontal lobe subjacent to the craniotomy. Bilateral periventricular white matter T2 hyperintensities are nonspecific but compatible with mild chronic small vessel ischemic disease. There is mild-to-moderate cerebral atrophy. Vascular: Major intracranial vascular flow voids are preserved. Skull and upper cervical spine: Left frontoparietal craniotomy. Upper cervical disc degeneration. Sinuses/Orbits: Bilateral cataract extraction. Mild bilateral maxillary and sphenoid sinus mucosal thickening. Large bilateral mastoid effusions and fluid in the pharynx in the setting of intubation. Other: None. IMPRESSION: 1. No acute intracranial abnormality. 2. Chronic left parietal, left frontal, and left cerebellar infarcts. 3. Mild chronic small vessel ischemia in the cerebral white matter. Electronically Signed   By: Logan Bores M.D.   On: 10/10/2017 16:52   Dg Pelvis Portable  Result Date: 10/10/2017 CLINICAL DATA:  MRI screening for metal EXAM: PORTABLE PELVIS 1-2 VIEWS COMPARISON:  10/07/2017, 11/07/2009 radiographs FINDINGS: No metallic density radiopaque foreign body is seen within the  included portions of the bony pelvis and overlying soft tissues. SI joints are non widened. There is a  catheter or probe inferior to the pubic symphysis. No fracture or malalignment although right femoral neck is obscured by the trochanter. Phleboliths in the right pelvis. There may be chronic avulsion injury at the left ischium. IMPRESSION: Negative. Electronically Signed   By: Donavan Foil M.D.   On: 10/10/2017 15:27   Dg Chest Port 1 View  Result Date: 10/11/2017 CLINICAL DATA:  Ventilator EXAM: PORTABLE CHEST 1 VIEW COMPARISON:  10/09/2017 FINDINGS: Endotracheal tube is 9 cm above the carina. Cardiomegaly with vascular congestion. Bilateral perihilar lower lobe opacities and layering effusions are similar prior study. NG tube is in the stomach. IMPRESSION: Bilateral layering effusions with cardiomegaly and bilateral perihilar and lower lobe airspace opacities, likely edema and/or atelectasis. Electronically Signed   By: Rolm Baptise M.D.   On: 10/11/2017 08:26    Scheduled Meds: . ALPRAZolam  0.5 mg Oral TID  . chlorhexidine gluconate (MEDLINE KIT)  15 mL Mouth Rinse BID  . feeding supplement (PRO-STAT SUGAR FREE 64)  60 mL Per Tube QID  . feeding supplement (VITAL HIGH PROTEIN)  1,000 mL Per Tube Q24H  . heparin  5,000 Units Subcutaneous Q8H  . Influenza vac split quadrivalent PF  0.5 mL Intramuscular Tomorrow-1000  . insulin aspart  0-20 Units Subcutaneous Q4H  . insulin detemir  10 Units Subcutaneous BID  . mouth rinse  15 mL Mouth Rinse 10 times per day  . multivitamin  15 mL Per Tube Daily  . polyethylene glycol  17 g Per Tube Daily  . senna-docusate  2 tablet Per Tube BID   Continuous Infusions: . sodium chloride 5 mL/hr at 10/11/17 0400  . ceFEPime (MAXIPIME) IV 2 g (10/11/17 1006)  . famotidine (PEPCID) IV 20 mg (10/11/17 1211)  . fentaNYL infusion INTRAVENOUS 200 mcg/hr (10/11/17 0506)  . norepinephrine (LEVOPHED) Adult infusion Stopped (10/09/17 0722)  . propofol (DIPRIVAN) infusion 35 mcg/kg/min (10/10/17 0601)  . valproate sodium 250 mg (10/11/17 1244)     Assessment/Plan:  1. Cardiac arrest.  Patient underwent hypothermia protocol.  Continue supportive care.  MRI shows old stroke neurology following 2. Acute hypoxic respiratory failure.  Continue ventilator support.  Continues to require 30% FiO2  3. left lower lobe pneumonia, right lower extremity ulceration with foul-smelling discharge, left first toe ulceration and swelling of the first toe likely chronic infection.  Antibiotics per the ICU team hypotension.  Off pressors 4. Acute kidney injury and hyperkalemia.   This has improved. 5. History of chronic diastolic congestive heart failure 6. Overall prognosis poor and high risk for cardiopulmonary arrest.  Patient listed as full code. 7. Anemia.  Responded to transfusion.  Last hemoglobin 8.5.  Code Status:     Code Status Orders  (From admission, onward)         Start     Ordered   10/07/17 0132  Full code  Continuous     10/07/17 0131        Code Status History    Date Active Date Inactive Code Status Order ID Comments User Context   10/07/2017 0031 10/07/2017 0131 Full Code [id number]  Awilda Bill, NP ED   06/17/2017 1856 06/23/2017 2244 Full Code [id number]  Nicholes Mango, MD Inpatient   02/18/2017 1623 02/18/2017 2133 Full Code [id number]  Katha Cabal, MD Inpatient   02/11/2017 1138 02/11/2017 1736 Full Code [id number]  Schnier, Dolores Lory, MD Inpatient  10/24/2014 1934 10/31/2014 1837 Full Code [id number]  Fritzi Mandes, MD Inpatient   10/11/2014 2347 10/17/2014 1844 Full Code [id number]  Lance Coon, MD Inpatient     Family Communication: As per critical care team Disposition Plan: To be determined  Consultants:  Critical care specialist  Antibiotics:  Cefepime  Time spent: 35 minutes [place]

## 2017-10-11 NOTE — Progress Notes (Signed)
Subjective: Patient is more agitated this morning.  Remains intubated.    Objective: Current vital signs: BP (!) 156/52   Pulse (!) 106   Temp 98.1 F (36.7 C) (Axillary)   Resp 20   Ht '6\' 3"'  (1.905 m)   Wt 126.4 kg   SpO2 97%   BMI 34.83 kg/m  Vital signs in last 24 hours: Temp:  [97.9 F (36.6 C)-98.9 F (37.2 C)] 98.1 F (36.7 C) (10/05 0320) Pulse Rate:  [85-118] 106 (10/05 0700) Resp:  [0-22] 20 (10/05 0700) BP: (122-208)/(42-109) 156/52 (10/05 0700) SpO2:  [92 %-98 %] 97 % (10/05 0843) FiO2 (%):  [30 %] 30 % (10/05 0843) Weight:  [126.4 kg] 126.4 kg (10/05 0354)  Intake/Output from previous day: 10/04 0701 - 10/05 0700 In: 1945.2 [I.V.:924.8; NG/GT:779.2; IV Piggyback:241.2] Out: 4098 [Urine:4350] Intake/Output this shift: No intake/output data recorded. Nutritional status:  Diet Order            Diet NPO time specified  Diet effective now              Neurologic Exam: Mental Status: Eyes open and agitated.  No attempts at speech.  Does not follow commands.   Cranial Nerves: II: Pupils 68m and reactive bilaterally III,IV,VI: doll's response present bilaterally although neck stiff V,VII: corneal reflex present bilaterally  VIII: appears to attend to examiner at times IX,X: gag reflex not tested XI: trapezius strength unable to test bilaterally XII: tongue strength unable to test Motor: Moves upper extremities spontaneously.  Withdraws lower extremities weakly.  Moves upper extremities more than lower extremities Sensory: Responds to noxious stimuli throughout Plantars: absent bilaterally  Lab Results: Basic Metabolic Panel: Recent Labs  Lab 10/07/17 0127  10/07/17 2016 10/08/17 0423 10/09/17 0331 10/10/17 0340 10/11/17 0516  NA  --    < > 137 135 140 141 144  K  --    < > 4.6 4.6 4.8 5.0 5.0  CL  --    < > 105 103 106 110 111  CO2  --    < > '26 26 26 26 28  ' GLUCOSE  --    < > 98 122* 184* 255* 253*  BUN  --    < > 51* 51* 48* 49* 45*   CREATININE  --    < > 1.46* 1.49* 1.26* 1.19 0.95  CALCIUM  --    < > 7.9* 7.8* 8.0* 8.0* 8.4*  MG 2.8*  --   --  2.7*  --   --   --   PHOS 5.0*  --   --  4.4  --   --   --    < > = values in this interval not displayed.    Liver Function Tests: Recent Labs  Lab 10/06/17 2016  AST 51*  ALT 25  ALKPHOS 91  BILITOT 0.5  PROT 7.0  ALBUMIN 3.5   No results for input(s): LIPASE, AMYLASE in the last 168 hours. No results for input(s): AMMONIA in the last 168 hours.  CBC: Recent Labs  Lab 10/06/17 2016 10/07/17 0606 10/08/17 0423 10/08/17 1120 10/09/17 0331 10/10/17 0340 10/11/17 0516  WBC 9.4 5.7 6.7  --  6.5 6.7 9.5  NEUTROABS 6.3 4.5  --   --   --   --  7.6*  HGB 6.3* 6.5* 6.9* 8.3* 8.1* 8.5* 8.5*  HCT 20.4* 19.7* 20.7* 24.9* 24.1* 26.0* 27.8*  MCV 85.6 82.5 83.7  --  84.9 86.1 85.8  PLT 333  224 238  --  231 244 267    Cardiac Enzymes: Recent Labs  Lab 10/06/17 2016 10/07/17 0127 10/07/17 0806 10/07/17 1436 10/07/17 2016  TROPONINI <0.03 <0.03 <0.03 <0.03 <0.03    Lipid Panel: No results for input(s): CHOL, TRIG, HDL, CHOLHDL, VLDL, LDLCALC in the last 168 hours.  CBG: Recent Labs  Lab 10/10/17 1733 10/10/17 1957 10/10/17 2348 10/11/17 0346 10/11/17 0754  GLUCAP 269* 267* 243* 202* 218*    Microbiology: Results for orders placed or performed during the hospital encounter of 10/06/17  Blood culture (routine x 2)     Status: None   Collection Time: 10/06/17 10:43 PM  Result Value Ref Range Status   Specimen Description BLOOD LEFT HAND   Final   Special Requests   Final    BOTTLES DRAWN AEROBIC AND ANAEROBIC Blood Culture adequate volume   Culture   Final    NO GROWTH 5 DAYS Performed at [facility], [address] [place], [place] [address]    Report Status 10/11/2017 FINAL  Final  Blood culture (routine x 2)     Status: None   Collection Time: 10/06/17 10:44 PM  Result Value Ref Range Status   Specimen Description BLOOD BLOOD  LEFT FOREARM  Final   Special Requests   Final    BOTTLES DRAWN AEROBIC AND ANAEROBIC Blood Culture adequate volume   Culture   Final    NO GROWTH 5 DAYS Performed at [facility], [address] [place], [place] [address]    Report Status 10/11/2017 FINAL  Final  MRSA PCR Screening     Status: None   Collection Time: 10/07/17  1:07 AM  Result Value Ref Range Status   MRSA by PCR NEGATIVE NEGATIVE Final    Comment:        The GeneXpert MRSA Assay (FDA approved for NASAL specimens only), is one component of a comprehensive MRSA colonization surveillance program. It is not intended to diagnose MRSA infection nor to guide or monitor treatment for MRSA infections. Performed at [facility], [address] [place], [place] [address]   Urine Culture     Status: None   Collection Time: 10/07/17  2:24 AM  Result Value Ref Range Status   Specimen Description   Final    URINE, RANDOM Performed at [facility], [address], [place], [place] [address]    Special Requests   Final    NONE Performed at [facility] [address], [place], [place] [address]    Culture   Final    NO GROWTH Performed at [facility], [place] [address] [place],  [address]    Report Status 10/08/2017 FINAL  Final  Aerobic/Anaerobic Culture (surgical/deep wound)     Status: Abnormal (Preliminary result)   Collection Time: 10/07/17  2:24 AM  Result Value Ref Range Status   Specimen Description TOE LEFT  Final   Special Requests PENDING  Incomplete   Gram Stain   Final    RARE SQUAMOUS EPITHELIAL CELLS PRESENT NO WBC SEEN MODERATE GRAM POSITIVE RODS FEW GRAM POSITIVE COCCI RARE GRAM NEGATIVE RODS Performed at [facility], [address] [place],  [address]    Culture (A)  Final    MULTIPLE ORGANISMS PRESENT, NONE PREDOMINANT FEW STAPHYLOCOCCUS AUREUS WITHIN MIXED CULTURE NO ANAEROBES ISOLATED; CULTURE IN PROGRESS FOR 5 DAYS     Report Status PENDING  Incomplete  Aerobic/Anaerobic Culture (surgical/deep wound)  Status: Abnormal (Preliminary result)   Collection Time: 10/07/17  2:24 AM  Result Value Ref Range Status   Specimen Description   Final    LEG RIGHT Performed at [facility], [address] [place], [place] [address]    Special Requests   Final    NONE Performed at [facility], [place], [place] [address]    Gram Stain   Final    NO WBC SEEN MODERATE GRAM NEGATIVE RODS FEW GRAM POSITIVE RODS RARE GRAM POSITIVE COCCI Performed at [facility], [place] [address] [place], [place] [address]    Culture (A)  Final    MULTIPLE ORGANISMS PRESENT, NONE PREDOMINANT NO ANAEROBES ISOLATED; CULTURE IN PROGRESS FOR 5 DAYS    Report Status PENDING  Incomplete    Coagulation Studies: No results for input(s): LABPROT, INR in the last 72 hours.  Imaging: Ct Head Wo Contrast  Result Date: 10/09/2017 CLINICAL DATA:  Question anoxic brain injury.  Cardiac arrest EXAM: CT HEAD WITHOUT CONTRAST TECHNIQUE: Contiguous axial images were obtained from the base of the skull through the vertex without intravenous contrast. 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 Small remote lateral left frontal infarct best seen on reformats. There is generalized atrophy. Small remote left cerebellar infarct. No detected acute infarct or swelling. No hemorrhage, hydrocephalus, or collection. Vascular: Atherosclerotic calcification.  No hyperdense vessel. Skull: Remote left frontal craniotomy.  No acute finding Sinuses/Orbits: No acute finding IMPRESSION: 1. No evident anoxic injury. 2. 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 Electronically Signed   By: Monte Fantasia M.D.   On: 10/09/2017 14:46   Mr Brain Wo Contrast  Result Date: 10/10/2017 CLINICAL DATA:  Altered mental status. EXAM: MRI HEAD WITHOUT CONTRAST TECHNIQUE: Multiplanar,  multiecho pulse sequences of the brain and surrounding structures were obtained without intravenous contrast. COMPARISON:  Head CT 10/09/2017 FINDINGS: The study is mildly motion degraded. Brain: There is no evidence of acute infarct, intracranial hemorrhage, mass, midline shift, or extra-axial fluid collection. Small chronic infarcts are again noted in the left parietal lobe and left cerebellum. There is also minimal cortical encephalomalacia laterally in the left frontal lobe subjacent to the craniotomy. Bilateral periventricular white matter T2 hyperintensities are nonspecific but compatible with mild chronic small vessel ischemic disease. There is mild-to-moderate cerebral atrophy. Vascular: Major intracranial vascular flow voids are preserved. Skull and upper cervical spine: Left frontoparietal craniotomy. Upper cervical disc degeneration. Sinuses/Orbits: Bilateral cataract extraction. Mild bilateral maxillary and sphenoid sinus mucosal thickening. Large bilateral mastoid effusions and fluid in the pharynx in the setting of intubation. Other: None. IMPRESSION: 1. No acute intracranial abnormality. 2. Chronic left parietal, left frontal, and left cerebellar infarcts. 3. Mild chronic small vessel ischemia in the cerebral white matter. Electronically Signed   By: Logan Bores M.D.   On: 10/10/2017 16:52   Dg Pelvis Portable  Result Date: 10/10/2017 CLINICAL DATA:  MRI screening for metal EXAM: PORTABLE PELVIS 1-2 VIEWS COMPARISON:  10/07/2017, 11/07/2009 radiographs FINDINGS: No metallic density radiopaque foreign body is seen within the included portions of the bony pelvis and overlying soft tissues. SI joints are non widened. There is a catheter or probe inferior to the pubic symphysis. No fracture or malalignment although right femoral neck is obscured by the trochanter. Phleboliths in the right pelvis. There may be chronic avulsion injury at the left ischium. IMPRESSION: Negative. Electronically Signed    By: Madie Reno.D.  On: 10/10/2017 15:27   Dg Chest Port 1 View  Result Date: 10/11/2017 CLINICAL DATA:  Ventilator EXAM: PORTABLE CHEST 1 VIEW COMPARISON:  10/09/2017 FINDINGS: Endotracheal tube is 9 cm above the carina. Cardiomegaly with vascular congestion. Bilateral perihilar lower lobe opacities and layering effusions are similar prior study. NG tube is in the stomach. IMPRESSION: Bilateral layering effusions with cardiomegaly and bilateral perihilar and lower lobe airspace opacities, likely edema and/or atelectasis. Electronically Signed   By: Rolm Baptise M.D.   On: 10/11/2017 08:26    Medications:  I have reviewed the patient's current medications. Scheduled: . ALPRAZolam  0.5 mg Oral TID  . chlorhexidine gluconate (MEDLINE KIT)  15 mL Mouth Rinse BID  . feeding supplement (PRO-STAT SUGAR FREE 64)  60 mL Per Tube QID  . feeding supplement (VITAL HIGH PROTEIN)  1,000 mL Per Tube Q24H  . heparin  5,000 Units Subcutaneous Q8H  . Influenza vac split quadrivalent PF  0.5 mL Intramuscular Tomorrow-1000  . insulin aspart  0-20 Units Subcutaneous Q4H  . insulin detemir  10 Units Subcutaneous BID  . mouth rinse  15 mL Mouth Rinse 10 times per day  . multivitamin  15 mL Per Tube Daily  . polyethylene glycol  17 g Per Tube Daily  . senna-docusate  2 tablet Per Tube BID    Assessment/Plan: Patient mor responsive this morning but is agitated and unable to follow commands.  MRI of the brain reviewed and shows no acute changes although there is evidence of previous infarcts and craniotomy. Also with chest x-ray showing evidence of opacities and effusions.  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  This will likely prolong recovery with patient also having a possibililty of not returning fully to baseline.      Will continue to follow with you.   LOS: 4 days   Alexis Goodell, MD Neurology [phone] 10/11/2017  10:10 AM

## 2017-10-11 NOTE — Progress Notes (Signed)
Pharmacy ICU Monitoring Consult:  Pharmacy consulted to assist in monitoring and replacing electrolytes in this 84 y.o. male admitted on 10/06/2017 with Cardiac Arrest   Labs:  Sodium (mmol/L)  Date Value  10/11/2017 144  07/04/2013 133 (L)   Potassium (mmol/L)  Date Value  10/11/2017 5.0  07/04/2013 4.3   Magnesium (mg/dL)  Date Value  16/10/9602 2.7 (H)   Phosphorus (mg/dL)  Date Value  54/09/8117 4.4   Calcium (mg/dL)  Date Value  14/78/2956 8.4 (L)   Calcium, Total (mg/dL)  Date Value  21/30/8657 8.7   Albumin (g/dL)  Date Value  84/69/6295 3.5  07/04/2013 3.6    Assessment/Plan: 1. Electrolytes: no replacement warranted. Potassium has trended up to 5 today. Will order electrolytes with morning labs to follow potassium.  2. Glucose mangagment: patient started on Levemir 10 units BID and Novolog 4 units Q4hr to cover tube feeds. Also has sliding scale. Will continue to monitor.   3. Constipation Management: daily Miralax and Senokot BID. Will continue to monitor and add as appropriate.   Pharmacy will continue to monitor and adjust per consult.   Demetrius Charity, PharmD 10/11/2017 8:18 AM

## 2017-10-11 NOTE — Progress Notes (Signed)
Advanced patients tube into 24cm at the lips, tube had came out to 21 at the lip. CXR from this am stated tube was 9cm above the carina. Advanced tube back to 24cm and leak was alleviated. Patient had bilateral breath sounds and was getting appropriate volumes on ventilator. Dr. Lonn Georgia aware.

## 2017-10-12 LAB — BASIC METABOLIC PANEL
Anion gap: 3 — ABNORMAL LOW (ref 5–15)
BUN: 47 mg/dL — AB (ref 8–23)
CHLORIDE: 113 mmol/L — AB (ref 98–111)
CO2: 28 mmol/L (ref 22–32)
CREATININE: 0.91 mg/dL (ref 0.61–1.24)
Calcium: 8.5 mg/dL — ABNORMAL LOW (ref 8.9–10.3)
GFR calc Af Amer: >60 mL/min (ref >60)
GFR calc non Af Amer: >60 mL/min (ref >60)
Glucose, Bld: 255 mg/dL — ABNORMAL HIGH (ref 70–99)
POTASSIUM: 5.1 mmol/L (ref 3.5–5.1)
Sodium: 144 mmol/L (ref 135–145)

## 2017-10-12 LAB — POTASSIUM: Potassium: 5.2 mmol/L — ABNORMAL HIGH (ref 3.5–5.1)

## 2017-10-12 LAB — GLUCOSE, CAPILLARY
GLUCOSE-CAPILLARY: 203 mg/dL — AB (ref 70–99)
GLUCOSE-CAPILLARY: 208 mg/dL — AB (ref 70–99)
Glucose-Capillary: 178 mg/dL — ABNORMAL HIGH (ref 70–99)
Glucose-Capillary: 192 mg/dL — ABNORMAL HIGH (ref 70–99)
Glucose-Capillary: 195 mg/dL — ABNORMAL HIGH (ref 70–99)

## 2017-10-12 LAB — AEROBIC/ANAEROBIC CULTURE (SURGICAL/DEEP WOUND): GRAM STAIN: NONE SEEN

## 2017-10-12 LAB — AEROBIC/ANAEROBIC CULTURE W GRAM STAIN (SURGICAL/DEEP WOUND)

## 2017-10-12 LAB — MAGNESIUM: MAGNESIUM: 2.7 mg/dL — AB (ref 1.7–2.4)

## 2017-10-12 MED ORDER — FUROSEMIDE 10 MG/ML IJ SOLN
40.0000 mg | Freq: Once | INTRAMUSCULAR | Status: AC
  Administered 2017-10-12: 40 mg via INTRAVENOUS
  Filled 2017-10-12: qty 4

## 2017-10-12 NOTE — Progress Notes (Signed)
Increased patients respiratory rate back to 16 due to patient not breathing enough.

## 2017-10-12 NOTE — Progress Notes (Signed)
Follow up - Critical Care Medicine Note  Patient Details:    Billy Liu is an 84 y.o. male.  With past medical history remarkable for hypertension, hyperlipidemia, gastroesophageal reflux disease, depression, Alzheimer's, coronary artery disease, COPD, stage III renal failure, chronic diastolic heart failure,  admitted with acute on chronic renal failure,  status post cardiac arrest, mechanically intubated, hypothermic protocol initiated @36  degrees C.   Lines, Airways, Drains: Airway 7.5 mm (Active)  Secured at (cm) 24 cm 10/10/2017  8:15 AM  Measured From Lips 10/10/2017  8:15 AM  Secured Location Left 10/10/2017  8:15 AM  Secured By [employer] 10/10/2017  8:15 AM  Tube Holder Repositioned Yes 10/10/2017  8:15 AM  Cuff Pressure (cm H2O) 28 cm H2O 10/10/2017  8:15 AM  Site Condition Dry 10/10/2017  8:15 AM     NG/OG Tube Orogastric Left mouth Xray Documented cm marking at nare/ corner of mouth 76 cm (Active)  Cm Marking at Nare/Corner of Mouth (if applicable) 70 cm 10/10/2017  8:00 AM  Site Assessment Clean;Dry;Intact 10/10/2017  8:00 AM  Ongoing Placement Verification No change in cm markings or external length of tube from initial placement;No acute changes, not attributed to clinical condition;No change in respiratory status 10/10/2017  8:00 AM  Status Infusing tube feed 10/10/2017  8:00 AM  Intake (mL) 120 mL 10/08/2017  6:14 PM     Urethral Catheter Urology MD Coude 16 Fr. (Active)  Indication for Insertion or Continuance of Catheter Unstable critical patients (first 24-48 hours);Bladder outlet obstruction / other urologic reason 10/10/2017  8:00 AM  Site Assessment Intact;Swelling;Edema 10/08/2017  7:45 AM  Catheter Maintenance Bag below level of bladder;Catheter secured;Drainage bag/tubing not touching floor;Insertion date on drainage bag;No dependent loops;Seal intact 10/10/2017  8:00 AM  Collection Container Standard drainage bag 10/10/2017  8:00 AM  Securement Method Tape  10/10/2017  8:00 AM  Urinary Catheter Interventions Unclamped 10/10/2017  8:00 AM  Output (mL) 1100 mL 10/10/2017  6:01 AM    Anti-infectives:  Anti-infectives (From admission, onward)   Start     Dose/Rate Route Frequency Ordered Stop   10/10/17 2200  ceFEPIme (MAXIPIME) 2 g in sodium chloride 0.9 % 100 mL IVPB     2 g 200 mL/hr over 30 Minutes Intravenous Every 12 hours 10/10/17 1557 10/13/17 2159   10/07/17 0600  ceFEPIme (MAXIPIME) 2 g in sodium chloride 0.9 % 100 mL IVPB  Status:  Discontinued     2 g 200 mL/hr over 30 Minutes Intravenous Every 24 hours 10/07/17 0050 10/07/17 0210   10/07/17 0600  vancomycin (VANCOCIN) 1,500 mg in sodium chloride 0.9 % 500 mL IVPB  Status:  Discontinued     1,500 mg 250 mL/hr over 120 Minutes Intravenous Every 24 hours 10/07/17 0050 10/07/17 1136   10/07/17 0600  ceFEPIme (MAXIPIME) 2 g in sodium chloride 0.9 % 100 mL IVPB  Status:  Discontinued     2 g 200 mL/hr over 30 Minutes Intravenous Every 12 hours 10/07/17 0210 10/09/17 1124   10/06/17 2130  ceFEPIme (MAXIPIME) 1 g in sodium chloride 0.9 % 100 mL IVPB     1 g 200 mL/hr over 30 Minutes Intravenous  Once 10/06/17 2126 10/07/17 0016   10/06/17 2130  vancomycin (VANCOCIN) IVPB 1000 mg/200 mL premix     1,000 mg 200 mL/hr over 60 Minutes Intravenous  Once 10/06/17 2126 10/07/17 0115      Microbiology: Results for orders placed or performed during the hospital encounter of 10/06/17  Blood culture (routine x 2)     Status: None   Collection Time: 10/06/17 10:43 PM  Result Value Ref Range Status   Specimen Description BLOOD LEFT HAND   Final   Special Requests   Final    BOTTLES DRAWN AEROBIC AND ANAEROBIC Blood Culture adequate volume   Culture   Final    NO GROWTH 5 DAYS Performed at [facility], [address] [place], [place] [address]    Report Status 10/11/2017 FINAL  Final  Blood culture (routine x 2)     Status: None   Collection Time: 10/06/17 10:44 PM  Result Value  Ref Range Status   Specimen Description BLOOD BLOOD LEFT FOREARM  Final   Special Requests   Final    BOTTLES DRAWN AEROBIC AND ANAEROBIC Blood Culture adequate volume   Culture   Final    NO GROWTH 5 DAYS Performed at [facility], [address], [place], [place] [address]    Report Status 10/11/2017 FINAL  Final  MRSA PCR Screening     Status: None   Collection Time: 10/07/17  1:07 AM  Result Value Ref Range Status   MRSA by PCR NEGATIVE NEGATIVE Final    Comment:        The GeneXpert MRSA Assay (FDA approved for NASAL specimens only), is one component of a comprehensive MRSA colonization surveillance program. It is not intended to diagnose MRSA infection nor to guide or monitor treatment for MRSA infections. Performed at [facility], [address] [place], [place] [address]   Urine Culture     Status: None   Collection Time: 10/07/17  2:24 AM  Result Value Ref Range Status   Specimen Description   Final    URINE, RANDOM Performed at [facility], [address], [place], [place] [address]    Special Requests   Final    NONE Performed at [facility], [address], [place], [place] [address]    Culture   Final    NO GROWTH Performed at [facility], [address] [place], [place] [address]    Report Status 10/08/2017 FINAL  Final  Aerobic/Anaerobic Culture (surgical/deep wound)     Status: None (Preliminary result)   Collection Time: 10/07/17  2:24 AM  Result Value Ref Range Status   Specimen Description TOE LEFT  Final   Special Requests PENDING  Incomplete   Gram Stain   Final    RARE SQUAMOUS EPITHELIAL CELLS PRESENT NO WBC SEEN MODERATE GRAM POSITIVE RODS FEW GRAM POSITIVE COCCI RARE GRAM NEGATIVE RODS Performed at [facility], [address], [place], [place] [address]    Culture   Final    FEW METHICILLIN RESISTANT STAPHYLOCOCCUS AUREUS WITHIN MIXED ORGANISMS NO ANAEROBES ISOLATED; CULTURE IN  PROGRESS FOR 5 DAYS    Report Status PENDING  Incomplete   Organism ID, Bacteria METHICILLIN RESISTANT STAPHYLOCOCCUS AUREUS  Final      Susceptibility   Methicillin resistant staphylococcus aureus - MIC*    CIPROFLOXACIN <=0.5 SENSITIVE Sensitive     ERYTHROMYCIN >=8 RESISTANT Resistant     GENTAMICIN <=0.5 SENSITIVE Sensitive     OXACILLIN >=4 RESISTANT Resistant     TETRACYCLINE <=1 SENSITIVE Sensitive     VANCOMYCIN 1 SENSITIVE Sensitive     TRIMETH/SULFA <=10 SENSITIVE Sensitive     CLINDAMYCIN <=0.25 SENSITIVE Sensitive     RIFAMPIN <=0.5 SENSITIVE Sensitive     Inducible Clindamycin NEGATIVE Sensitive     *  FEW METHICILLIN RESISTANT STAPHYLOCOCCUS AUREUS  Aerobic/Anaerobic Culture (surgical/deep wound)     Status: Abnormal (Preliminary result)   Collection Time: 10/07/17  2:24 AM  Result Value Ref Range Status   Specimen Description   Final    LEG RIGHT Performed at [facility], [address], [place], [place] [address]    Special Requests   Final    NONE Performed at [facility], [address] [place], [place] [address]    Gram Stain   Final    NO WBC SEEN MODERATE GRAM NEGATIVE RODS FEW GRAM POSITIVE RODS RARE GRAM POSITIVE COCCI Performed at [facility], [address] [place], [place] [address]    Culture (A)  Final    MULTIPLE ORGANISMS PRESENT, NONE PREDOMINANT NO ANAEROBES ISOLATED; CULTURE IN PROGRESS FOR 5 DAYS    Report Status PENDING  Incomplete   Studies: Dg Chest 1 View  Result Date: 10/07/2017 CLINICAL DATA:  Feeding tube, evaluate placement of endotracheal tube EXAM: CHEST  1 VIEW COMPARISON:  Portable chest x-ray of 10/06/2016 FINDINGS: The tip of the endotracheal tube is approximately 3.9 cm above the carina. The lungs remain poorly aerated and cardiomegaly is stable. Haziness at both lung bases may reflect atelectasis and effusion although pneumonia cannot be excluded. NG tube extends below the hemidiaphragm.  IMPRESSION: 1. Endotracheal tube tip 3.9 cm above the carina. 2. Poor aeration with basilar volume loss, probable effusions, and pneumonia cannot be excluded. 3. Stable cardiomegaly. 4. NG tube extends below the hemidiaphragm. Electronically Signed   By: Dwyane Dee M.D.   On: 10/07/2017 14:03   Dg Abd 1 View  Result Date: 10/07/2017 CLINICAL DATA:  Evaluate position of feeding tube EXAM: ABDOMEN - 1 VIEW COMPARISON:  Abdomen 10/07/2016 FINDINGS: A tip of the feeding tube is seen within the fundus dex proximal body of the stomach. Bowel gas pattern is nonspecific. Some volume loss is again noted at the lung bases and cardiomegaly is stable IMPRESSION: Feeding tube tip extends to the region of the fundus-proximal stomach. The bowel gas pattern is nonspecific Electronically Signed   By: Dwyane Dee M.D.   On: 10/07/2017 14:04   Dg Abd 1 View  Result Date: 10/07/2017 CLINICAL DATA:  84 year old male with nasogastric tube placement. Subsequent encounter. EXAM: ABDOMEN - 1 VIEW COMPARISON:  10/07/2017 2:36 a.m. FINDINGS: Nasogastric tube radiopaque marker can only be well delineated to the level of the distal esophagus. Tip of the nasogastric tube may enter the stomach but is not adequately assessed secondary to overlying leads in the left upper quadrant. Removal of leads or change of lead position and repeat exam may prove helpful for further delineation. Nonspecific bowel gas pattern. IMPRESSION: Nasogastric tube radiopaque marker can only be well delineated to the level of the distal esophagus. Tip of the nasogastric tube may enter the stomach but is not adequately assessed secondary to overlying leads in the left upper quadrant. Removal of leads or change of lead position and repeat exam may prove helpful for further delineation. These results will be called to the ordering clinician or representative by the Radiologist Assistant, and communication documented in the PACS or zVision Dashboard. Electronically  Signed   By: Lacy Duverney M.D.   On: 10/07/2017 08:32   Dg Abd 1 View  Result Date: 10/07/2017 CLINICAL DATA:  OG tube placement, second attempt EXAM: ABDOMEN - 1 VIEW COMPARISON:  10/07/2017 at 0228 hours FINDINGS: Enteric tube terminates in the gastric cardia with  its side port at/just above the GE junction. IMPRESSION: Enteric tube terminates in the gastric cardia with its side port at/just above the GE junction. Electronically Signed   By: Charline Bills M.D.   On: 10/07/2017 02:48   Dg Abd 1 View  Result Date: 10/07/2017 CLINICAL DATA:  OG tube placement EXAM: ABDOMEN - 1 VIEW COMPARISON:  10/06/2017 FINDINGS: Enteric tube terminates at the GE junction with the side port in the distal esophagus. IMPRESSION: Enteric tube terminates at the GE junction with the side port in the distal esophagus. Electronically Signed   By: Charline Bills M.D.   On: 10/07/2017 02:47   Dg Abd 1 View  Result Date: 10/06/2017 CLINICAL DATA:  OG tube EXAM: ABDOMEN - 1 VIEW COMPARISON:  None. FINDINGS: By history patient has an OG tube. Just to the RIGHT of the thoracic spine, there is a radiopaque line, possibly an orogastric tube. However this is slightly to the RIGHT of the expected course of the esophagus and does not extend into the LEFT UPPER QUADRANT. It is possible that orogastric tube has been withdrawn or removed and not included on the film. There is dense opacification of the LEFT lung base. Visualized bowel gas pattern is nonobstructive. IMPRESSION: Possible orogastric tube to the distal esophagus, but this appears slightly to the RIGHT of the expected course of the esophagus. Recommend confirmation of orogastric tube placement and consider advancing tube further into the stomach if this is felt to be the orogastric tube. Significant LEFT LOWER lobe opacity. Electronically Signed   By: Norva Pavlov M.D.   On: 10/06/2017 21:00   Ct Head Wo Contrast  Result Date: 10/09/2017 CLINICAL DATA:  Question  anoxic brain injury.  Cardiac arrest EXAM: CT HEAD WITHOUT CONTRAST TECHNIQUE: Contiguous axial images were obtained from the base of the skull through the vertex without intravenous contrast. 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 Small remote lateral left frontal infarct best seen on reformats. There is generalized atrophy. Small remote left cerebellar infarct. No detected acute infarct or swelling. No hemorrhage, hydrocephalus, or collection. Vascular: Atherosclerotic calcification.  No hyperdense vessel. Skull: Remote left frontal craniotomy.  No acute finding Sinuses/Orbits: No acute finding IMPRESSION: 1. No evident anoxic injury. 2. 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 Electronically Signed   By: Marnee Spring M.D.   On: 10/09/2017 14:46   Ct Head Wo Contrast  Result Date: 10/06/2017 CLINICAL DATA:  Altered mental status (AMS), unclear cause; C-spine trauma, high clinical risk (NEXUS/CCR). Collapsed in the bathroom. EXAM: CT HEAD WITHOUT CONTRAST CT CERVICAL SPINE WITHOUT CONTRAST TECHNIQUE: Multidetector CT imaging of the head and cervical spine was performed following the standard protocol without intravenous contrast. Multiplanar CT image reconstructions of the cervical spine were also generated. COMPARISON:  Head CT 06/17/2017 FINDINGS: CT HEAD FINDINGS Brain: Unchanged degree of atrophy and chronic small vessel ischemia. Encephalomalacia in the left parietal lobe again seen. No intracranial hemorrhage, mass effect, or midline shift. No hydrocephalus. The basilar cisterns are patent. No evidence of territorial infarct or acute ischemia. No extra-axial or intracranial fluid collection. Vascular: Atherosclerosis of skullbase vasculature without hyperdense vessel or abnormal calcification. Skull: Prior left frontoparietal craniotomy.  No skull fracture. Sinuses/Orbits: Paranasal sinuses and mastoid air cells are clear. The  visualized orbits are unremarkable. Bilateral cataract resection. Other: None. CT CERVICAL SPINE FINDINGS Alignment: Straightening of normal cervical lordosis. Skull base and vertebrae: No acute fracture. Bulky anterior osteophytes from C3-C4 through  C6-C7, well corticated lucency through the osteophytes at C4 and C5 may represent remote prior trauma. The dens and skull base are intact. Soft tissues and spinal canal: Debris in the hypopharynx limits assessment for prevertebral edema, no gross prevertebral soft tissue thickening. No evidence of canal hematoma. Disc levels: Near complete disc space loss at C2-C3 with bony ankylosis of the posterior elements on the left. Disc space narrowing at C5-C6 and C6-C7. 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 Bulky anterior osteophytes throughout, some of which are fragmented. Multilevel facet arthropathy. Upper chest: Moderate right pleural effusion, partially included. Other: Soft tissue prominence involving the right aspect of the hypopharynx is incompletely included in the field of view, causing mild leftward endotracheal and enteric tube deviation. There are dense vascular calcifications. IMPRESSION: 1.  No acute intracranial abnormality.  No skull fracture. 2. Unchanged atrophy, chronic small vessel ischemia, and left parietal encephalomalacia. 3. Advanced multilevel degenerative change in the cervical spine without evidence of acute fracture. 4. Soft tissue prominence involving the right hypopharynx is only partially included in the field of view, this may represent retained secretions or asymmetric positioning of the patient's tongue, however underlying mass lesion is not excluded. Consider contrast enhanced CT of the neck as clinically indicated. 5. Moderate right pleural effusion partially included in the field of view. Electronically Signed   By: Narda Rutherford M.D.   On: 10/06/2017 21:38   Ct Cervical Spine Wo Contrast  Result Date:  10/06/2017 CLINICAL DATA:  Altered mental status (AMS), unclear cause; C-spine trauma, high clinical risk (NEXUS/CCR). Collapsed in the bathroom. EXAM: CT HEAD WITHOUT CONTRAST CT CERVICAL SPINE WITHOUT CONTRAST TECHNIQUE: Multidetector CT imaging of the head and cervical spine was performed following the standard protocol without intravenous contrast. Multiplanar CT image reconstructions of the cervical spine were also generated. COMPARISON:  Head CT 06/17/2017 FINDINGS: CT HEAD FINDINGS Brain: Unchanged degree of atrophy and chronic small vessel ischemia. Encephalomalacia in the left parietal lobe again seen. No intracranial hemorrhage, mass effect, or midline shift. No hydrocephalus. The basilar cisterns are patent. No evidence of territorial infarct or acute ischemia. No extra-axial or intracranial fluid collection. Vascular: Atherosclerosis of skullbase vasculature without hyperdense vessel or abnormal calcification. Skull: Prior left frontoparietal craniotomy.  No skull fracture. Sinuses/Orbits: Paranasal sinuses and mastoid air cells are clear. The visualized orbits are unremarkable. Bilateral cataract resection. Other: None. CT CERVICAL SPINE FINDINGS Alignment: Straightening of normal cervical lordosis. Skull base and vertebrae: No acute fracture. Bulky anterior osteophytes from C3-C4 through C6-C7, well corticated lucency through the osteophytes at C4 and C5 may represent remote prior trauma. The dens and skull base are intact. Soft tissues and spinal canal: Debris in the hypopharynx limits assessment for prevertebral edema, no gross prevertebral soft tissue thickening. No evidence of canal hematoma. Disc levels: Near complete disc space loss at C2-C3 with bony ankylosis of the posterior elements on the left. Disc space narrowing at C5-C6 and C6-C7. 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 Bulky anterior osteophytes throughout, some of which are fragmented. Multilevel facet  arthropathy. Upper chest: Moderate right pleural effusion, partially included. Other: Soft tissue prominence involving the right aspect of the hypopharynx is incompletely included in the field of view, causing mild leftward endotracheal and enteric tube deviation. There are dense vascular calcifications. IMPRESSION: 1.  No acute intracranial abnormality.  No skull fracture. 2. Unchanged atrophy, chronic small vessel ischemia, and left parietal encephalomalacia. 3. Advanced multilevel degenerative change in the cervical spine without evidence of  acute fracture. 4. Soft tissue prominence involving the right hypopharynx is only partially included in the field of view, this may represent retained secretions or asymmetric positioning of the patient's tongue, however underlying mass lesion is not excluded. Consider contrast enhanced CT of the neck as clinically indicated. 5. Moderate right pleural effusion partially included in the field of view. Electronically Signed   By: Narda Rutherford M.D.   On: 10/06/2017 21:38   Mr Brain Wo Contrast  Result Date: 10/10/2017 CLINICAL DATA:  Altered mental status. EXAM: MRI HEAD WITHOUT CONTRAST TECHNIQUE: Multiplanar, multiecho pulse sequences of the brain and surrounding structures were obtained without intravenous contrast. COMPARISON:  Head CT 10/09/2017 FINDINGS: The study is mildly motion degraded. Brain: There is no evidence of acute infarct, intracranial hemorrhage, mass, midline shift, or extra-axial fluid collection. Small chronic infarcts are again noted in the left parietal lobe and left cerebellum. There is also minimal cortical encephalomalacia laterally in the left frontal lobe subjacent to the craniotomy. Bilateral periventricular white matter T2 hyperintensities are nonspecific but compatible with mild chronic small vessel ischemic disease. There is mild-to-moderate cerebral atrophy. Vascular: Major intracranial vascular flow voids are preserved. Skull and upper  cervical spine: Left frontoparietal craniotomy. Upper cervical disc degeneration. Sinuses/Orbits: Bilateral cataract extraction. Mild bilateral maxillary and sphenoid sinus mucosal thickening. Large bilateral mastoid effusions and fluid in the pharynx in the setting of intubation. Other: None. IMPRESSION: 1. No acute intracranial abnormality. 2. Chronic left parietal, left frontal, and left cerebellar infarcts. 3. Mild chronic small vessel ischemia in the cerebral white matter. Electronically Signed   By: Sebastian Ache M.D.   On: 10/10/2017 16:52   Dg Pelvis Portable  Result Date: 10/10/2017 CLINICAL DATA:  MRI screening for metal EXAM: PORTABLE PELVIS 1-2 VIEWS COMPARISON:  10/07/2017, 11/07/2009 radiographs FINDINGS: No metallic density radiopaque foreign body is seen within the included portions of the bony pelvis and overlying soft tissues. SI joints are non widened. There is a catheter or probe inferior to the pubic symphysis. No fracture or malalignment although right femoral neck is obscured by the trochanter. Phleboliths in the right pelvis. There may be chronic avulsion injury at the left ischium. IMPRESSION: Negative. Electronically Signed   By: Jasmine Pang M.D.   On: 10/10/2017 15:27   Dg Chest Port 1 View  Result Date: 10/11/2017 CLINICAL DATA:  Ventilator EXAM: PORTABLE CHEST 1 VIEW COMPARISON:  10/09/2017 FINDINGS: Endotracheal tube is 9 cm above the carina. Cardiomegaly with vascular congestion. Bilateral perihilar lower lobe opacities and layering effusions are similar prior study. NG tube is in the stomach. IMPRESSION: Bilateral layering effusions with cardiomegaly and bilateral perihilar and lower lobe airspace opacities, likely edema and/or atelectasis. Electronically Signed   By: Charlett Nose M.D.   On: 10/11/2017 08:26   Dg Chest Port 1 View  Result Date: 10/09/2017 CLINICAL DATA:  Acute respiratory failure. EXAM: PORTABLE CHEST 1 VIEW COMPARISON:  Radiograph 10/07/2017 FINDINGS:  Endotracheal tube 4.4 cm from the carina. Enteric tube in place with tip and side-port below the diaphragm. Cardiomegaly is unchanged. Worsening hazy opacities throughout both lungs. Bibasilar volume loss again seen with associated effusions. No pneumothorax. IMPRESSION: Worsening lung aeration with increasing hazy opacities throughout both lungs, likely combination of pleural fluid, atelectasis, and pulmonary edema. Electronically Signed   By: Narda Rutherford M.D.   On: 10/09/2017 04:06   Dg Chest Portable 1 View  Result Date: 10/06/2017 CLINICAL DATA:  Ett tube EXAM: PORTABLE CHEST 1 VIEW COMPARISON:  06/20/2017 FINDINGS: Interval  placement of endotracheal tube, tip approximately 3.0 centimeters above the carina. An orogastric tube is in place, tip overlying the level of the LOWER esophagus but difficult to confirm. The heart is enlarged. There is dense opacity in the LEFT lung base. Perihilar opacities are consistent with pulmonary edema. IMPRESSION: Interval placement of endotracheal tube and orogastric tube. LEFT LOWER lobe opacity and pulmonary edema. Electronically Signed   By: Norva Pavlov M.D.   On: 10/06/2017 21:02   Dg Abd Portable 1v  Result Date: 10/07/2017 CLINICAL DATA:  Orogastric tube placement. EXAM: PORTABLE ABDOMEN - 1 VIEW COMPARISON:  Radiograph of same day. FINDINGS: The bowel gas pattern is normal. Distal tip of nasogastric tube is seen in proximal stomach, with side hole at expected position of gastroesophageal junction. No radio-opaque calculi or other significant radiographic abnormality are seen. IMPRESSION: Distal tip of nasogastric tube seen in proximal stomach, with side hole at expected position of gastroesophageal junction. Advancement is recommended. Electronically Signed   By: Lupita Raider, M.D.   On: 10/07/2017 12:18    Consults: Treatment Team:  Pccm, Raymond Gurney, MD Laurier Nancy, MD Kym Groom, MD   Subjective:    Overnight Issues: No  significant changes in the last 24 hours, weaned off of sedation, status post EEG and MRI.  Appreciate neurology's consultation  Objective:  Vital signs for last 24 hours: Temp:  [97.7 F (36.5 C)-99.3 F (37.4 C)] 99.3 F (37.4 C) (10/06 0754) Pulse Rate:  [65-111] 65 (10/06 0700) Resp:  [16-23] 16 (10/06 0700) BP: (121-162)/(45-106) 153/56 (10/06 0700) SpO2:  [93 %-97 %] 97 % (10/06 0700) FiO2 (%):  [30 %] 30 % (10/06 0821) Weight:  [125.9 kg] 125.9 kg (10/06 0325)  Hemodynamic parameters for last 24 hours:    Intake/Output from previous day: 10/05 0701 - 10/06 0700 In: 2111.2 [I.V.:1158.5; NG/GT:510; IV Piggyback:442.8] Out: 1851 [Urine:1850; Stool:1]  Intake/Output this shift: No intake/output data recorded.  Vent settings for last 24 hours: Vent Mode: PRVC FiO2 (%):  [30 %] 30 % Set Rate:  [6 bmp-16 bmp] 6 bmp Vt Set:  [500 mL] 500 mL PEEP:  [2 cmH20-5 cmH20] 2 cmH20 Plateau Pressure:  [16 cmH20-22 cmH20] 22 cmH20  Physical Exam:  Vital signs: Please see the above listed vital signs HEENT: Trachea midline, orally intubated, no accessory muscle utilization Cardiovascular: Regular rate and rhythm Abdominal: Positive bowel sounds, soft exam Extremities: No clubbing cyanosis, 1+ edema appreciated Neurologic: Patient is sedated, will track with eyes but no purposeful response  Assessment/Plan:   Status post cardiac arrest.  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  Being followed by neurology.  Post EEG and MRI, please see reports, patient is off sedation, appreciate neurology input  Hypoxemic/hypercapnic respiratory failure.  Patient remains on mechanical ventilation, weaning has been limited secondary to altered mental status post cardiac arrest.    Renal failure.  Pending a.m. Labs  Anemia.  No evidence of active bleeding  Wound culture positive for staph with some methicillin-resistant  species  Critical Care Total Time 35 minutes  Rhonna Holster 10/12/2017  *Care during the described time interval was provided by me and/or other providers on the critical care team.  I have reviewed this patient's available data, including medical history, events of note, physical examination and test results as part of my evaluation. Patient ID: Billy Liu, male   DOB: 28-Aug-1932, 84 y.o.   MRN: [id number] Patient ID: Billy Liu,  male   DOB: Jun 05, 1932, 84 y.o.   MRN: [id number]

## 2017-10-12 NOTE — Progress Notes (Signed)
Pt had a 14 beat run of vtach, MD notified. New orders placed, will continue to monitor.

## 2017-10-12 NOTE — Progress Notes (Signed)
All sedation off vent respirations on 6, vent alarming that pt was not breathing. RRT turned settings up to 16. Pt will now squeeze my hands and can move his arms. Pt will not open his eyes or move his legs. Will continue to monitor on these settings and reassess in an hour.

## 2017-10-12 NOTE — Progress Notes (Signed)
Pharmacy ICU Monitoring Consult:  Pharmacy consulted to assist in monitoring and replacing electrolytes in this 84 y.o. male admitted on 10/06/2017 with Cardiac Arrest   Labs:  Sodium (mmol/L)  Date Value  10/11/2017 144  07/04/2013 133 (L)   Potassium (mmol/L)  Date Value  10/12/2017 5.2 (H)  07/04/2013 4.3   Magnesium (mg/dL)  Date Value  16/10/9602 2.7 (H)   Phosphorus (mg/dL)  Date Value  54/09/8117 4.4   Calcium (mg/dL)  Date Value  14/78/2956 8.4 (L)   Calcium, Total (mg/dL)  Date Value  21/30/8657 8.7   Albumin (g/dL)  Date Value  84/69/6295 3.5  07/04/2013 3.6    Assessment/Plan: 1. Electrolytes: no replacement warranted. Potassium 5.2 today. Will  follow potassium.  2. Glucose mangagment: patient started on Levemir 10 units BID and Novolog 4 units Q4hr to cover tube feeds. Also has sliding scale. Will continue to monitor.   3. Constipation Management: daily Miralax and Senokot BID. Will continue to monitor and add as appropriate.   Pharmacy will continue to monitor and adjust per consult.   Demetrius Charity, PharmD 10/12/2017 8:35 AM

## 2017-10-12 NOTE — Progress Notes (Signed)
Patient ID: Billy Liu, male   DOB: 1932/03/21, 84 y.o.   MRN: [id number]  [facility] PROGRESS NOTE  Chelsey Kimberley GYK:[id number] DOB: 18-Mar-1932 DOA: 10/06/2017 PCP: Alvester Morin, MD  HPI/Subjective: Remains sedated  Objective: Vitals:   10/12/17 0900 10/12/17 1128  BP: (!) 141/55   Pulse: 65   Resp: 13   Temp:    SpO2:  95%    Filed Weights   10/10/17 0239 10/11/17 0354 10/12/17 0325  Weight: 126.9 kg 126.4 kg 125.9 kg    ROS: Review of Systems  Unable to perform ROS: Acuity of condition   Exam: Physical Exam  Constitutional: He appears lethargic. He is intubated.  HENT:  Nose: Mucosal edema present.  Unable to look into mouth  Eyes: Conjunctivae and lids are normal.  Pupils pinpoint  Neck: Carotid bruit is not present.  Cardiovascular: Regular rhythm, S1 normal, S2 normal and normal heart sounds.  Respiratory: He is intubated. He has decreased breath sounds in the right lower field and the left lower field. He has no wheezes. He has rhonchi in the right lower field and the left lower field.  GI: Bowel sounds are normal. There is no tenderness.  Musculoskeletal:       Right ankle: He exhibits swelling.       Left ankle: He exhibits swelling.  Neurological: He appears lethargic.  Opens eyes and moves arms with stimulation  Skin: Skin is warm.  Right posterior calf large area of greenish discoloration and foul-smelling in an ulcer.  Left foot first toe with small ulceration and very swollen first toe.  Likely from chronic infection  Psychiatric:  Opens eyes and moves his arms to stimulation      Data Reviewed: Basic Metabolic Panel: Recent Labs  Lab 10/07/17 0127  10/07/17 2016 10/08/17 0423 10/09/17 0331 10/10/17 0340 10/11/17 0516 10/12/17 0625  NA  --    < > 137 135 140 141 144  --   K  --    < > 4.6 4.6 4.8 5.0 5.0 5.2*  CL  --    < > 105 103 106 110 111  --   CO2  --    < > '26 26 26 26 28  ' --   GLUCOSE  --    < > 98 122*  184* 255* 253*  --   BUN  --    < > 51* 51* 48* 49* 45*  --   CREATININE  --    < > 1.46* 1.49* 1.26* 1.19 0.95  --   CALCIUM  --    < > 7.9* 7.8* 8.0* 8.0* 8.4*  --   MG 2.8*  --   --  2.7*  --   --   --   --   PHOS 5.0*  --   --  4.4  --   --   --   --    < > = values in this interval not displayed.   Liver Function Tests: Recent Labs  Lab 10/06/17 2016  AST 51*  ALT 25  ALKPHOS 91  BILITOT 0.5  PROT 7.0  ALBUMIN 3.5   CBC: Recent Labs  Lab 10/06/17 2016 10/07/17 0606 10/08/17 0423 10/08/17 1120 10/09/17 0331 10/10/17 0340 10/11/17 0516  WBC 9.4 5.7 6.7  --  6.5 6.7 9.5  NEUTROABS 6.3 4.5  --   --   --   --  7.6*  HGB 6.3* 6.5* 6.9* 8.3* 8.1* 8.5* 8.5*  HCT 20.4* 19.7* 20.7*  24.9* 24.1* 26.0* 27.8*  MCV 85.6 82.5 83.7  --  84.9 86.1 85.8  PLT 333 224 238  --  231 244 267   Cardiac Enzymes: Recent Labs  Lab 10/06/17 2016 10/07/17 0127 10/07/17 0806 10/07/17 1436 10/07/17 2016  TROPONINI <0.03 <0.03 <0.03 <0.03 <0.03    CBG: Recent Labs  Lab 10/11/17 1936 10/11/17 2342 10/12/17 0336 10/12/17 0713 10/12/17 1143  GLUCAP 179* 198* 178* 192* 203*    Recent Results (from the past 240 hour(s))  Blood culture (routine x 2)     Status: None   Collection Time: 10/06/17 10:43 PM  Result Value Ref Range Status   Specimen Description BLOOD LEFT HAND   Final   Special Requests   Final    BOTTLES DRAWN AEROBIC AND ANAEROBIC Blood Culture adequate volume   Culture   Final    NO GROWTH 5 DAYS Performed at [facility], [address] [place], [place] [address]    Report Status 10/11/2017 FINAL  Final  Blood culture (routine x 2)     Status: None   Collection Time: 10/06/17 10:44 PM  Result Value Ref Range Status   Specimen Description BLOOD BLOOD LEFT FOREARM  Final   Special Requests   Final    BOTTLES DRAWN AEROBIC AND ANAEROBIC Blood Culture adequate volume   Culture   Final    NO GROWTH 5 DAYS Performed at [facility], [address] [place], [place] [address]    Report Status 10/11/2017 FINAL  Final  MRSA PCR Screening     Status: None   Collection Time: 10/07/17  1:07 AM  Result Value Ref Range Status   MRSA by PCR NEGATIVE NEGATIVE Final    Comment:        The GeneXpert MRSA Assay (FDA approved for NASAL specimens only), is one component of a comprehensive MRSA colonization surveillance program. It is not intended to diagnose MRSA infection nor to guide or monitor treatment for MRSA infections. Performed at [facility], [address] [place], [place] [address]   Urine Culture     Status: None   Collection Time: 10/07/17  2:24 AM  Result Value Ref Range Status   Specimen Description   Final    URINE, RANDOM Performed at [facility], [address], [place], [place] [address]    Special Requests   Final    NONE Performed at [facility], [address], [place], [place] [address]    Culture   Final    NO GROWTH Performed at [facility], [place] [address] [place], [place] [address]    Report Status 10/08/2017 FINAL  Final  Aerobic/Anaerobic Culture (surgical/deep wound)     Status: None   Collection Time: 10/07/17  2:24 AM  Result Value Ref Range Status   Specimen Description TOE LEFT  Final   Special Requests WOUND  Final   Gram Stain   Final    RARE SQUAMOUS EPITHELIAL CELLS PRESENT NO WBC SEEN MODERATE GRAM POSITIVE RODS FEW GRAM POSITIVE COCCI RARE GRAM NEGATIVE RODS    Culture   Final    FEW METHICILLIN RESISTANT STAPHYLOCOCCUS AUREUS WITHIN MIXED ORGANISMS NO ANAEROBES ISOLATED Performed at [facility], [place] [address], [place],  [address]    Report Status 10/12/2017 FINAL  Final   Organism ID, Bacteria METHICILLIN RESISTANT STAPHYLOCOCCUS AUREUS  Final      Susceptibility   Methicillin resistant staphylococcus aureus - MIC*  CIPROFLOXACIN <=0.5 SENSITIVE Sensitive     ERYTHROMYCIN >=8 RESISTANT Resistant      GENTAMICIN <=0.5 SENSITIVE Sensitive     OXACILLIN >=4 RESISTANT Resistant     TETRACYCLINE <=1 SENSITIVE Sensitive     VANCOMYCIN 1 SENSITIVE Sensitive     TRIMETH/SULFA <=10 SENSITIVE Sensitive     CLINDAMYCIN <=0.25 SENSITIVE Sensitive     RIFAMPIN <=0.5 SENSITIVE Sensitive     Inducible Clindamycin NEGATIVE Sensitive     * FEW METHICILLIN RESISTANT STAPHYLOCOCCUS AUREUS  Aerobic/Anaerobic Culture (surgical/deep wound)     Status: Abnormal   Collection Time: 10/07/17  2:24 AM  Result Value Ref Range Status   Specimen Description   Final    LEG RIGHT Performed at [facility], [place] [address], [place], [place] [address]    Special Requests   Final    NONE Performed at [facility], [place], [place] [address]    Gram Stain   Final    NO WBC SEEN MODERATE GRAM NEGATIVE RODS FEW GRAM POSITIVE RODS RARE GRAM POSITIVE COCCI Performed at [facility], [place] [address], [place], [place] [address]    Culture (A)  Final    MULTIPLE ORGANISMS PRESENT, NONE PREDOMINANT MIXED ANAEROBIC FLORA PRESENT.  CALL LAB IF FURTHER IID REQUIRED.    Report Status 10/12/2017 FINAL  Final     Studies: Mr Brain Wo Contrast  Result Date: 10/10/2017 CLINICAL DATA:  Altered mental status. EXAM: MRI HEAD WITHOUT CONTRAST TECHNIQUE: Multiplanar, multiecho pulse sequences of the brain and surrounding structures were obtained without intravenous contrast. COMPARISON:  Head CT 10/09/2017 FINDINGS: The study is mildly motion degraded. Brain: There is no evidence of acute infarct, intracranial hemorrhage, mass, midline shift, or extra-axial fluid collection. Small chronic infarcts are again noted in the left parietal lobe and left cerebellum. There is also minimal cortical encephalomalacia laterally in the left frontal lobe subjacent to the craniotomy. Bilateral periventricular white matter T2 hyperintensities are nonspecific but compatible with mild chronic small vessel ischemic  disease. There is mild-to-moderate cerebral atrophy. Vascular: Major intracranial vascular flow voids are preserved. Skull and upper cervical spine: Left frontoparietal craniotomy. Upper cervical disc degeneration. Sinuses/Orbits: Bilateral cataract extraction. Mild bilateral maxillary and sphenoid sinus mucosal thickening. Large bilateral mastoid effusions and fluid in the pharynx in the setting of intubation. Other: None. IMPRESSION: 1. No acute intracranial abnormality. 2. Chronic left parietal, left frontal, and left cerebellar infarcts. 3. Mild chronic small vessel ischemia in the cerebral white matter. Electronically Signed   By: Logan Bores M.D.   On: 10/10/2017 16:52   Dg Pelvis Portable  Result Date: 10/10/2017 CLINICAL DATA:  MRI screening for metal EXAM: PORTABLE PELVIS 1-2 VIEWS COMPARISON:  10/07/2017, 11/07/2009 radiographs FINDINGS: No metallic density radiopaque foreign body is seen within the included portions of the bony pelvis and overlying soft tissues. SI joints are non widened. There is a catheter or probe inferior to the pubic symphysis. No fracture or malalignment although right femoral neck is obscured by the trochanter. Phleboliths in the right pelvis. There may be chronic avulsion injury at the left ischium. IMPRESSION: Negative. Electronically Signed   By: Donavan Foil M.D.   On: 10/10/2017 15:27   Dg Chest Port 1 View  Result Date: 10/11/2017 CLINICAL DATA:  Ventilator EXAM: PORTABLE CHEST 1 VIEW COMPARISON:  10/09/2017 FINDINGS: Endotracheal tube is 9 cm above the carina. Cardiomegaly with vascular congestion. Bilateral perihilar lower lobe opacities and layering effusions are similar prior  study. NG tube is in the stomach. IMPRESSION: Bilateral layering effusions with cardiomegaly and bilateral perihilar and lower lobe airspace opacities, likely edema and/or atelectasis. Electronically Signed   By: Rolm Baptise M.D.   On: 10/11/2017 08:26    Scheduled Meds: . ALPRAZolam   0.5 mg Oral TID  . chlorhexidine gluconate (MEDLINE KIT)  15 mL Mouth Rinse BID  . feeding supplement (PRO-STAT SUGAR FREE 64)  60 mL Per Tube QID  . feeding supplement (VITAL HIGH PROTEIN)  1,000 mL Per Tube Q24H  . heparin  5,000 Units Subcutaneous Q8H  . Influenza vac split quadrivalent PF  0.5 mL Intramuscular Tomorrow-1000  . insulin aspart  0-20 Units Subcutaneous Q4H  . insulin detemir  10 Units Subcutaneous BID  . mouth rinse  15 mL Mouth Rinse 10 times per day  . multivitamin  15 mL Per Tube Daily  . polyethylene glycol  17 g Per Tube Daily  . senna-docusate  2 tablet Per Tube BID   Continuous Infusions: . sodium chloride 5 mL/hr at 10/12/17 0700  . dexmedetomidine (PRECEDEX) IV infusion 0.4 mcg/kg/hr (10/12/17 1153)  . famotidine (PEPCID) IV 20 mg (10/12/17 0911)  . fentaNYL infusion INTRAVENOUS 200 mcg/hr (10/12/17 1153)  . norepinephrine (LEVOPHED) Adult infusion Stopped (10/09/17 0722)  . propofol (DIPRIVAN) infusion 35 mcg/kg/min (10/10/17 0601)  . sodium chloride Stopped (10/11/17 2309)  . valproate sodium Stopped (10/12/17 0419)    Assessment/Plan:  1. Cardiac arrest.  Patient underwent hypothermia protocol.  Continue supportive care.  MRI shows old stroke neurology following 2. Acute hypoxic respiratory failure.  Continue ventilator support.  Continues to require 30% FiO2  3. left lower lobe pneumonia, right lower extremity ulceration with foul-smelling discharge, left first toe ulceration and swelling of the first toe likely chronic infection.  Antibiotics per the ICU team hypotension.  Off pressors 4. Acute kidney injury and hyperkalemia.   This has improved. 5. History of chronic diastolic congestive heart failure 6. Overall prognosis poor and high risk for cardiopulmonary arrest.  Patient listed as full code. 7. Anemia.  Responded to transfusion.    Code Status:     Code Status Orders  (From admission, onward)         Start     Ordered   10/07/17 0132   Full code  Continuous     10/07/17 0131        Code Status History    Date Active Date Inactive Code Status Order ID Comments User Context   10/07/2017 0031 10/07/2017 0131 Full Code [id number]  Awilda Bill, NP ED   06/17/2017 1856 06/23/2017 2244 Full Code [id number]  Nicholes Mango, MD Inpatient   02/18/2017 1623 02/18/2017 2133 Full Code [id number]  Delana Meyer, Dolores Lory, MD Inpatient   02/11/2017 1138 02/11/2017 1736 Full Code [id number]  Schnier, Dolores Lory, MD Inpatient   10/24/2014 1934 10/31/2014 1837 Full Code [id number]  Fritzi Mandes, MD Inpatient   10/11/2014 2347 10/17/2014 1844 Full Code [id number]  Lance Coon, MD Inpatient     Family Communication: As per critical care team Disposition Plan: To be determined  Consultants:  Critical care specialist  Antibiotics:  Cefepime  Time spent: 35 minutes [place]

## 2017-10-12 NOTE — Progress Notes (Signed)
Pt turned to spontaneous breathing trial and did not show signs of being able to protect his airway. Dr. Lonn Georgia and RRT at bedside, agreed to stop trial and start sedation back. Orders carried out, will continue to monitor.

## 2017-10-13 ENCOUNTER — Inpatient Hospital Stay: Payer: Medicare Other

## 2017-10-13 DIAGNOSIS — J9602 Acute respiratory failure with hypercapnia: Secondary | ICD-10-CM

## 2017-10-13 DIAGNOSIS — J9601 Acute respiratory failure with hypoxia: Secondary | ICD-10-CM

## 2017-10-13 LAB — COMPREHENSIVE METABOLIC PANEL
ALK PHOS: 60 U/L (ref 38–126)
ALT: 17 U/L (ref 0–44)
ANION GAP: 7 (ref 5–15)
AST: 18 U/L (ref 15–41)
Albumin: 2.4 g/dL — ABNORMAL LOW (ref 3.5–5.0)
BILIRUBIN TOTAL: 0.3 mg/dL (ref 0.3–1.2)
BUN: 39 mg/dL — ABNORMAL HIGH (ref 8–23)
CALCIUM: 8.7 mg/dL — AB (ref 8.9–10.3)
CO2: 28 mmol/L (ref 22–32)
Chloride: 110 mmol/L (ref 98–111)
Creatinine, Ser: 0.84 mg/dL (ref 0.61–1.24)
Glucose, Bld: 194 mg/dL — ABNORMAL HIGH (ref 70–99)
Potassium: 4.9 mmol/L (ref 3.5–5.1)
SODIUM: 145 mmol/L (ref 135–145)
TOTAL PROTEIN: 6.4 g/dL — AB (ref 6.5–8.1)

## 2017-10-13 LAB — CBC WITH DIFFERENTIAL/PLATELET
BASOS ABS: 0 10*3/uL (ref 0–0.1)
Basophils Relative: 1 %
EOS ABS: 0.6 10*3/uL (ref 0–0.7)
Eosinophils Relative: 9 %
HCT: 28 % — ABNORMAL LOW (ref 40.0–52.0)
Hemoglobin: 8.8 g/dL — ABNORMAL LOW (ref 13.0–18.0)
Lymphocytes Relative: 9 %
Lymphs Abs: 0.5 10*3/uL — ABNORMAL LOW (ref 1.0–3.6)
MCH: 26.9 pg (ref 26.0–34.0)
MCHC: 31.2 g/dL — ABNORMAL LOW (ref 32.0–36.0)
MCV: 86.1 fL (ref 80.0–100.0)
Monocytes Absolute: 0.6 10*3/uL (ref 0.2–1.0)
Monocytes Relative: 10 %
Neutro Abs: 4.4 10*3/uL (ref 1.4–6.5)
Neutrophils Relative %: 71 %
PLATELETS: 249 10*3/uL (ref 150–440)
RBC: 3.26 MIL/uL — AB (ref 4.40–5.90)
RDW: 18.8 % — ABNORMAL HIGH (ref 11.5–14.5)
WBC: 6.1 10*3/uL (ref 3.8–10.6)

## 2017-10-13 LAB — GLUCOSE, CAPILLARY
GLUCOSE-CAPILLARY: 206 mg/dL — AB (ref 70–99)
GLUCOSE-CAPILLARY: 167 mg/dL — AB (ref 70–99)
GLUCOSE-CAPILLARY: 147 mg/dL — AB (ref 70–99)
Glucose-Capillary: 124 mg/dL — ABNORMAL HIGH (ref 70–99)
Glucose-Capillary: 200 mg/dL — ABNORMAL HIGH (ref 70–99)
Glucose-Capillary: 216 mg/dL — ABNORMAL HIGH (ref 70–99)

## 2017-10-13 LAB — POTASSIUM: POTASSIUM: 5.1 mmol/L (ref 3.5–5.1)

## 2017-10-13 MED ORDER — MIDAZOLAM HCL 2 MG/2ML IJ SOLN
1.0000 mg | INTRAMUSCULAR | Status: AC | PRN
  Administered 2017-10-22 (×3): 1 mg via INTRAVENOUS
  Filled 2017-10-13 (×2): qty 2

## 2017-10-13 MED ORDER — FENTANYL BOLUS VIA INFUSION
25.0000 ug | INTRAVENOUS | Status: DC | PRN
  Administered 2017-10-13: 100 ug via INTRAVENOUS
  Administered 2017-10-14: 50 ug via INTRAVENOUS
  Administered 2017-10-15: 100 ug via INTRAVENOUS
  Administered 2017-10-15 (×2): 75 ug via INTRAVENOUS
  Administered 2017-10-16 – 2017-10-21 (×4): 100 ug via INTRAVENOUS
  Administered 2017-10-21 (×2): 50 ug via INTRAVENOUS
  Administered 2017-10-22: 100 ug via INTRAVENOUS
  Administered 2017-10-22: 50 ug via INTRAVENOUS
  Administered 2017-10-22 – 2017-10-24 (×8): 100 ug via INTRAVENOUS
  Filled 2017-10-13: qty 100

## 2017-10-13 MED ORDER — VANCOMYCIN HCL 10 G IV SOLR
1250.0000 mg | Freq: Three times a day (TID) | INTRAVENOUS | Status: DC
  Filled 2017-10-13 (×3): qty 1250

## 2017-10-13 MED ORDER — SODIUM CHLORIDE 0.9 % IV SOLN
1.0000 g | Freq: Three times a day (TID) | INTRAVENOUS | Status: AC
  Administered 2017-10-13 – 2017-10-19 (×20): 1 g via INTRAVENOUS
  Filled 2017-10-13 (×20): qty 1

## 2017-10-13 MED ORDER — ETOMIDATE 2 MG/ML IV SOLN: 20.0000 mg | Freq: Once | INTRAVENOUS | Status: AC

## 2017-10-13 MED ORDER — VANCOMYCIN HCL 10 G IV SOLR
1250.0000 mg | Freq: Three times a day (TID) | INTRAVENOUS | Status: DC
  Administered 2017-10-14 (×2): 1250 mg via INTRAVENOUS
  Filled 2017-10-13 (×4): qty 1250

## 2017-10-13 MED ORDER — MIDAZOLAM HCL 2 MG/2ML IJ SOLN
1.0000 mg | INTRAMUSCULAR | Status: DC | PRN
  Administered 2017-10-14 – 2017-10-25 (×12): 1 mg via INTRAVENOUS
  Filled 2017-10-13 (×16): qty 2

## 2017-10-13 MED ORDER — ETOMIDATE 2 MG/ML IV SOLN
INTRAVENOUS | Status: AC
  Administered 2017-10-13: 20 mg
  Filled 2017-10-13: qty 10

## 2017-10-13 MED ORDER — VANCOMYCIN HCL 10 G IV SOLR
1250.0000 mg | Freq: Once | INTRAVENOUS | Status: AC
  Administered 2017-10-13: 1250 mg via INTRAVENOUS
  Filled 2017-10-13 (×2): qty 1250

## 2017-10-13 MED ORDER — ETOMIDATE 2 MG/ML IV SOLN: 40.0000 mg | Freq: Once | INTRAVENOUS | Status: DC

## 2017-10-13 MED ORDER — FAMOTIDINE 20 MG PO TABS
20.0000 mg | ORAL_TABLET | Freq: Two times a day (BID) | ORAL | Status: DC
  Administered 2017-10-13 – 2017-10-27 (×27): 20 mg
  Filled 2017-10-13 (×27): qty 1

## 2017-10-13 NOTE — Progress Notes (Signed)
Subjective: Patient is more responsive with periods of intermittent restlessness. He is back on sedation due to agitation. He is however able to follow simple commands when prompted. Per nursing reports, he failed spontaneous vent weaning trial yesterday.  Objective: Current vital signs: BP (!) 158/59   Pulse 64   Temp 97.8 F (36.6 C) (Axillary)   Resp 16   Ht '6\' 3"'  (1.905 m)   Wt 124.7 kg   SpO2 98%   BMI 34.36 kg/m  Vital signs in last 24 hours: Temp:  [97.5 F (36.4 C)-98.9 F (37.2 C)] 97.8 F (36.6 C) (10/07 0800) Pulse Rate:  [57-69] 64 (10/07 0800) Resp:  [9-20] 16 (10/07 0800) BP: (141-179)/(47-68) 158/59 (10/07 0800) SpO2:  [95 %-99 %] 98 % (10/07 0800) FiO2 (%):  [30 %] 30 % (10/07 0757) Weight:  [124.7 kg] 124.7 kg (10/07 0456)  Intake/Output from previous day: 10/06 0701 - 10/07 0700 In: 2319.7 [I.V.:1034.7; NG/GT:990; IV Piggyback:295] Out: 3500 [Urine:3500] Intake/Output this shift: Total I/O In: 62.3 [I.V.:55.2; IV Piggyback:7.1] Out: -  Nutritional status:  Diet Order            Diet NPO time specified  Diet effective now             Neurologic Exam:  Mental Status: Eyes open spontaneously to verbal stimuli. He is restless and agitated.  No attempts at speech.  Able to follow simple commands such as sticking tongue out, shaking head no and move bilateral upper and lower extremities Cranial Nerves: YT:KPTWSF 32m and reactive bilaterally III,IV,VI: doll's responsepresent bilaterallyalthough neck stiff V,VII: corneal reflexpresentbilaterally VIII: appears to attend to examiner at times IX,X: gag reflexnot tested XI: trapezius strength unable to test bilaterally XII: tongue strength unable to test Motor: Moves upper extremities spontaneously.  Withdraws lower extremities weakly.  Moves upper extremities more than lower extremities Sensory: Respondsto noxious stimuli throughout Plantars: absentbilaterally  Lab Results: Basic  Metabolic Panel: Recent Labs  Lab 10/07/17 0127  10/08/17 0423 10/09/17 0331 10/10/17 0340 10/11/17 0516 10/12/17 0625 10/12/17 1728 10/13/17 0452  NA  --    < > 135 140 141 144  --  144  --   K  --    < > 4.6 4.8 5.0 5.0 5.2* 5.1 5.1  CL  --    < > 103 106 110 111  --  113*  --   CO2  --    < > '26 26 26 28  ' --  28  --   GLUCOSE  --    < > 122* 184* 255* 253*  --  255*  --   BUN  --    < > 51* 48* 49* 45*  --  47*  --   CREATININE  --    < > 1.49* 1.26* 1.19 0.95  --  0.91  --   CALCIUM  --    < > 7.8* 8.0* 8.0* 8.4*  --  8.5*  --   MG 2.8*  --  2.7*  --   --   --   --  2.7*  --   PHOS 5.0*  --  4.4  --   --   --   --   --   --    < > = values in this interval not displayed.    Liver Function Tests: Recent Labs  Lab 10/06/17 2016  AST 51*  ALT 25  ALKPHOS 91  BILITOT 0.5  PROT 7.0  ALBUMIN 3.5  No results for input(s): LIPASE, AMYLASE in the last 168 hours. No results for input(s): AMMONIA in the last 168 hours.  CBC: Recent Labs  Lab 10/06/17 2016 10/07/17 0606 10/08/17 0423 10/08/17 1120 10/09/17 0331 10/10/17 0340 10/11/17 0516  WBC 9.4 5.7 6.7  --  6.5 6.7 9.5  NEUTROABS 6.3 4.5  --   --   --   --  7.6*  HGB 6.3* 6.5* 6.9* 8.3* 8.1* 8.5* 8.5*  HCT 20.4* 19.7* 20.7* 24.9* 24.1* 26.0* 27.8*  MCV 85.6 82.5 83.7  --  84.9 86.1 85.8  PLT 333 224 238  --  231 244 267    Cardiac Enzymes: Recent Labs  Lab 10/06/17 2016 10/07/17 0127 10/07/17 0806 10/07/17 1436 10/07/17 2016  TROPONINI <0.03 <0.03 <0.03 <0.03 <0.03    Lipid Panel: No results for input(s): CHOL, TRIG, HDL, CHOLHDL, VLDL, LDLCALC in the last 168 hours.  CBG: Recent Labs  Lab 10/12/17 1555 10/12/17 2000 10/12/17 2357 10/13/17 0351 10/13/17 0723  GLUCAP 208* 195* 200* 216* 206*    Microbiology: Results for orders placed or performed during the hospital encounter of 10/06/17  Blood culture (routine x 2)     Status: None   Collection Time: 10/06/17 10:43 PM  Result Value Ref  Range Status   Specimen Description BLOOD LEFT HAND   Final   Special Requests   Final    BOTTLES DRAWN AEROBIC AND ANAEROBIC Blood Culture adequate volume   Culture   Final    NO GROWTH 5 DAYS Performed at [facility], [address] [place], [place] [address]    Report Status 10/11/2017 FINAL  Final  Blood culture (routine x 2)     Status: None   Collection Time: 10/06/17 10:44 PM  Result Value Ref Range Status   Specimen Description BLOOD BLOOD LEFT FOREARM  Final   Special Requests   Final    BOTTLES DRAWN AEROBIC AND ANAEROBIC Blood Culture adequate volume   Culture   Final    NO GROWTH 5 DAYS Performed at [facility], [address] [place], [place] [address]    Report Status 10/11/2017 FINAL  Final  MRSA PCR Screening     Status: None   Collection Time: 10/07/17  1:07 AM  Result Value Ref Range Status   MRSA by PCR NEGATIVE NEGATIVE Final    Comment:        The GeneXpert MRSA Assay (FDA approved for NASAL specimens only), is one component of a comprehensive MRSA colonization surveillance program. It is not intended to diagnose MRSA infection nor to guide or monitor treatment for MRSA infections. Performed at [facility], [address], [place], [place] [address]   Urine Culture     Status: None   Collection Time: 10/07/17  2:24 AM  Result Value Ref Range Status   Specimen Description   Final    URINE, RANDOM Performed at [facility], [address] [place], [place] [address]    Special Requests   Final    NONE Performed at [facility], [address] [place], [place] [address]    Culture   Final    NO GROWTH Performed at [facility], [place] [address], [place], [place] [address]    Report Status 10/08/2017 FINAL  Final  Aerobic/Anaerobic Culture (surgical/deep wound)     Status: None   Collection Time: 10/07/17  2:24 AM  Result Value Ref Range Status   Specimen Description TOE LEFT  Final    Special Requests WOUND  Final   Gram Stain   Final    RARE SQUAMOUS EPITHELIAL CELLS PRESENT NO WBC SEEN MODERATE GRAM POSITIVE RODS FEW GRAM POSITIVE COCCI RARE GRAM NEGATIVE RODS    Culture   Final    FEW METHICILLIN RESISTANT STAPHYLOCOCCUS AUREUS WITHIN MIXED ORGANISMS NO ANAEROBES ISOLATED Performed at [facility], [place] [address], [place], [place] [address]    Report Status 10/12/2017 FINAL  Final   Organism ID, Bacteria METHICILLIN RESISTANT STAPHYLOCOCCUS AUREUS  Final      Susceptibility   Methicillin resistant staphylococcus aureus - MIC*    CIPROFLOXACIN <=0.5 SENSITIVE Sensitive     ERYTHROMYCIN >=8 RESISTANT Resistant     GENTAMICIN <=0.5 SENSITIVE Sensitive     OXACILLIN >=4 RESISTANT Resistant     TETRACYCLINE <=1 SENSITIVE Sensitive     VANCOMYCIN 1 SENSITIVE Sensitive     TRIMETH/SULFA <=10 SENSITIVE Sensitive     CLINDAMYCIN <=0.25 SENSITIVE Sensitive     RIFAMPIN <=0.5 SENSITIVE Sensitive     Inducible Clindamycin NEGATIVE Sensitive     * FEW METHICILLIN RESISTANT STAPHYLOCOCCUS AUREUS  Aerobic/Anaerobic Culture (surgical/deep wound)     Status: Abnormal   Collection Time: 10/07/17  2:24 AM  Result Value Ref Range Status   Specimen Description   Final    LEG RIGHT Performed at [place] [address] [place], [place] [address]    Special Requests   Final    NONE Performed at [facility], [place], [place] [address]    Gram Stain   Final    NO WBC SEEN MODERATE GRAM NEGATIVE RODS FEW GRAM POSITIVE RODS RARE GRAM POSITIVE COCCI Performed at [facility], [place] [address], [place],  [address]    Culture (A)  Final    MULTIPLE ORGANISMS PRESENT, NONE PREDOMINANT MIXED ANAEROBIC FLORA PRESENT.  CALL LAB IF FURTHER IID REQUIRED.    Report Status 10/12/2017 FINAL  Final    Coagulation Studies: No results for input(s): LABPROT, INR in the last 72 hours.  Imaging: Dg Chest Port 1 View  Result  Date: 10/13/2017 CLINICAL DATA:  On mechanically assisted ventilation, CHF, COPD EXAM: PORTABLE CHEST 1 VIEW COMPARISON:  Portable chest x-ray of 10/11/2017 and 10/09/2017 FINDINGS: The tip of the endotracheal tube is approximately 3.8 cm above the carina. Bilateral pleural effusions again are noted with some airspace disease most consistent with congestive heart failure and cardiomegaly is noted. NG tube extends below the hemidiaphragm. There are diffuse degenerative changes throughout the thoracic spine. IMPRESSION: 1. Little change in probable CHF with effusions, cardiomegaly and pulmonary edema. 2. Tip of endotracheal tube 3.8 cm above the carina. Electronically Signed   By: Ivar Drape M.D.   On: 10/13/2017 08:52    Medications:  I have reviewed the patient's current medications. Prior to Admission:  Medications Prior to Admission  Medication Sig Dispense Refill Last Dose  . acetaminophen (TYLENOL) 500 MG tablet Take 500 mg by mouth 3 (three) times daily.   10/06/2017 at 1709  . amLODipine (NORVASC) 10 MG tablet Take 10 mg by mouth daily.   10/06/2017 at 0953  . aspirin EC 81 MG tablet Take 81 mg by mouth daily.   10/06/2017 at 0953  . Cholecalciferol (VITAMIN D) 2000 units tablet Take 2,000 Units by mouth daily.   10/06/2017 at 0953  . cloNIDine (CATAPRES) 0.2 MG tablet Take 1 tablet (0.2 mg total) by mouth 3 (  three) times daily. 60 tablet 11 10/06/2017 at 1320  . divalproex (DEPAKOTE) 250 MG DR tablet Take 250 mg by mouth daily.    10/06/2017 at 0930  . divalproex (DEPAKOTE) 500 MG DR tablet Take 500 mg by mouth at bedtime.   10/05/2017 at 2226  . ferrous sulfate 324 (65 Fe) MG TBEC Take 324 mg by mouth daily.   10/06/2017 at 0953  . finasteride (PROSCAR) 5 MG tablet Take 1 tablet (5 mg total) by mouth daily.   10/06/2017 at 0953  . glucagon, human recombinant, (GLUCAGEN DIAGNOSTIC) 1 MG injection Inject 1 mg into the muscle once as needed for low blood sugar.   07/07/2017 at 1222  . hydrALAZINE  (APRESOLINE) 100 MG tablet Take 100 mg by mouth 3 (three) times daily.   10/06/2017 at 1320  . insulin detemir (LEVEMIR) 100 UNIT/ML injection Inject 40 Units into the skin 2 (two) times daily.    10/06/2017 at 0953  . metoprolol succinate (TOPROL-XL) 100 MG 24 hr tablet Take 300 mg by mouth daily.    10/06/2017 at 0953  . pantoprazole (PROTONIX) 40 MG tablet Take 1 tablet (40 mg total) by mouth daily. 30 tablet 0 10/06/2017 at 0606  . polyethylene glycol (MIRALAX / GLYCOLAX) packet Take 17 g by mouth daily.    10/06/2017 at 0953  . senna (SENOKOT) 8.6 MG TABS tablet Take 2 tablets by mouth at bedtime.   10/05/2017 at 2226  . sertraline (ZOLOFT) 25 MG tablet Take 75 mg by mouth daily.    10/06/2017 at 0953  . simvastatin (ZOCOR) 20 MG tablet Take 20 mg by mouth at bedtime.    10/05/2017 at 2226  . Sunscreens (AVEENO DAILY MOISTURIZER) LOTN Apply topically at bedtime. Apply to bilateral feet and legs   10/05/2017 at 2254  . torsemide (DEMADEX) 20 MG tablet Take 2 tablets (40 mg total) by mouth daily.   10/06/2017 at 0953  . vitamin C (ASCORBIC ACID) 500 MG tablet Take 500 mg by mouth daily.   10/06/2017 at 0953  . omeprazole (PRILOSEC) 40 MG capsule Take 1 capsule (40 mg total) by mouth 2 (two) times daily for 14 days. 28 capsule 0    Scheduled: . ALPRAZolam  0.5 mg Oral TID  . chlorhexidine gluconate (MEDLINE KIT)  15 mL Mouth Rinse BID  . feeding supplement (PRO-STAT SUGAR FREE 64)  60 mL Per Tube QID  . feeding supplement (VITAL HIGH PROTEIN)  1,000 mL Per Tube Q24H  . heparin  5,000 Units Subcutaneous Q8H  . Influenza vac split quadrivalent PF  0.5 mL Intramuscular Tomorrow-1000  . insulin aspart  0-20 Units Subcutaneous Q4H  . insulin detemir  10 Units Subcutaneous BID  . mouth rinse  15 mL Mouth Rinse 10 times per day  . multivitamin  15 mL Per Tube Daily  . polyethylene glycol  17 g Per Tube Daily  . senna-docusate  2 tablet Per Tube BID    Patient seen and examined.  Clinical course and  management discussed.  Necessary edits performed.  I agree with the above.  Assessment and plan of care developed and discussed below.    Assessment/Plan: Patient improving.  Able to follow commands despite sedation.  Agree with continuing management of underlying medical issues.  No further imaging required at this time.   Will continue to follow prn.  This patient was staffed with Dr. Magda Paganini, Doy Mince who personally evaluated patient, reviewed documentation and agreed with assessment and plan of care as above.  Rufina Falco, DNP, FNP-BC Board certified Nurse Practitioner Neurology Department   LOS: 6 days   10/13/2017  8:59 AM    Alexis Goodell, MD Neurology [phone]  10/13/2017  1:56 PM

## 2017-10-13 NOTE — Progress Notes (Signed)
Pharmacy ICU Monitoring Consult:  Pharmacy consulted to assist in monitoring and replacing electrolytes in this 84 y.o. male admitted on 10/06/2017 with Cardiac Arrest   Labs:  Sodium (mmol/L)  Date Value  10/13/2017 145  07/04/2013 133 (L)   Potassium (mmol/L)  Date Value  10/13/2017 4.9  07/04/2013 4.3   Magnesium (mg/dL)  Date Value  16/10/9602 2.7 (H)   Phosphorus (mg/dL)  Date Value  54/09/8117 4.4   Calcium (mg/dL)  Date Value  14/78/2956 8.7 (L)   Calcium, Total (mg/dL)  Date Value  21/30/8657 8.7   Albumin (g/dL)  Date Value  84/69/6295 2.4 (L)  07/04/2013 3.6    Assessment/Plan: 1. Electrolytes: no replacement warranted.   2. Glucose mangagment: Levemir 10 units BID and SSI Q4hr. Tube feeds are currently on hold. Will continue to monitor and adjust per consult.   3. Constipation Management: Last BM 10/6. Continue daily Miralax and Senokot BID.   Pharmacy will continue to monitor and adjust per consult.   Gunther Zawadzki L 10/13/2017 5:20 PM

## 2017-10-13 NOTE — Progress Notes (Signed)
Nutrition Follow-up  DOCUMENTATION CODES:   Obesity unspecified  INTERVENTION:  Continue Vital High Protein at 30 mL/hr (720 mL goal daily volume) + Pro-Stat 60 mL QID. Provides 1520 kcal, 183 grams of protein, 605 mL H2O daily.  Continue liquid MVI daily per tube.  Continue free water flush of 30 mL Q4hrs to maintain tube patency.  NUTRITION DIAGNOSIS:   Inadequate oral intake related to inability to eat as evidenced by NPO status.  Ongoing - addressing with TF regimen.  GOAL:   Provide needs based on ASPEN/SCCM guidelines  Met with TF regimen.  MONITOR:   Vent status, Labs, Weight trends, TF tolerance, Skin, I & O's  REASON FOR ASSESSMENT:   Ventilator    ASSESSMENT:   84 year old male with PMHx of chronic diastolic CHF, COPD, CAD, DM, HTN, GERD, HLD, anxiety, depression, CKD stage III, dementia who is admitted from [facility] after cardiac arrest, intubated on 9/30, 36C TTM protocol initiated, with possible LLL PNA, pulmonary edema, acute on chronic renal failure.   Patient intubated and on Precedex gtt. On PRVC mode with FiO2 30% and PEEP 5 cmH2O. Per discussion on rounds today plan is for SBT today. Abdomen soft. Per chart patient had a medium type 4 BM on 10/6. Noted that patient self-extubated today and had to be emergently re-intubated.  Enteral Access: OGT placed 10/7; terminates in stomach per abdominal x-ray 10/7  MAP: 89-99 mmHg  TF: pt tolerating Vital High Protein at 30 mL/hr and Pro-Stat 60 mL QID  Patient is currently intubated on ventilator support Ve: 7.5 L/min Temp (24hrs), Avg:97.9 F (36.6 C), Min:97.5 F (36.4 C), Max:98.9 F (37.2 C)  Propofol: N/A  Medications reviewed and include: Xanax 0.5 mg TID, famotidine, Novolog 0-20 units Q4hrs, Levemir 10 units BID, liquid MVI daily per tube, Miralax, senna-docusate, Precedex gtt, fentanyl gtt now off, meropenem, valproate, vancomycin. Propofol gtt was stopped on 10/4.  Labs reviewed: CBG  167, BUN 39.  I/O: 3500 mL UOP yesterday (1.2 mL/kg/hr)  Weight trend: 124.7 kg on 10/7; -1.1 kg from 10/1  Discussed with RN and on rounds.  Diet Order:   Diet Order            Diet NPO time specified  Diet effective now              EDUCATION NEEDS:   No education needs have been identified at this time  Skin:  Skin Assessment: Skin Integrity Issues:(cellulitis and DM ulcers to bilateral lower extremities)  Last BM:  10/12/2017 - medium type 4  Height:   Ht Readings from Last 1 Encounters:  10/07/17 '6\' 3"'  (1.905 m)    Weight:   Wt Readings from Last 1 Encounters:  10/13/17 124.7 kg    Ideal Body Weight:  89.1 kg  BMI:  Body mass index is 34.36 kg/m.  Estimated Nutritional Needs:   Kcal:  6237-6283 (11-14 kcal/kg)  Protein:  178 grams (2 grams/kg IBW)  Fluid:  1.5-1.8 L/day  Willey Blade, MS, RD, LDN Office: [phone] Pager: [phone] After Hours/Weekend Pager: [phone]

## 2017-10-13 NOTE — Progress Notes (Signed)
Follow up - Critical Care Medicine Note  Patient Details:    Billy Liu is an 84 y.o. male.  With past medical history remarkable for hypertension, hyperlipidemia, gastroesophageal reflux disease, depression, Alzheimer's, coronary artery disease, COPD, stage III renal failure, chronic diastolic heart failure,  admitted with acute on chronic renal failure,  status post cardiac arrest, mechanically intubated, hypothermic protocol initiated @36  degrees C.   Lines, Airways, Drains: Airway 7.5 mm (Active)  Secured at (cm) 24 cm 10/10/2017  8:15 AM  Measured From Lips 10/10/2017  8:15 AM  Secured Location Left 10/10/2017  8:15 AM  Secured By [employer] 10/10/2017  8:15 AM  Tube Holder Repositioned Yes 10/10/2017  8:15 AM  Cuff Pressure (cm H2O) 28 cm H2O 10/10/2017  8:15 AM  Site Condition Dry 10/10/2017  8:15 AM     NG/OG Tube Orogastric Left mouth Xray Documented cm marking at nare/ corner of mouth 76 cm (Active)  Cm Marking at Nare/Corner of Mouth (if applicable) 70 cm 10/10/2017  8:00 AM  Site Assessment Clean;Dry;Intact 10/10/2017  8:00 AM  Ongoing Placement Verification No change in cm markings or external length of tube from initial placement;No acute changes, not attributed to clinical condition;No change in respiratory status 10/10/2017  8:00 AM  Status Infusing tube feed 10/10/2017  8:00 AM  Intake (mL) 120 mL 10/08/2017  6:14 PM     Urethral Catheter Urology MD Coude 16 Fr. (Active)  Indication for Insertion or Continuance of Catheter Unstable critical patients (first 24-48 hours);Bladder outlet obstruction / other urologic reason 10/10/2017  8:00 AM  Site Assessment Intact;Swelling;Edema 10/08/2017  7:45 AM  Catheter Maintenance Bag below level of bladder;Catheter secured;Drainage bag/tubing not touching floor;Insertion date on drainage bag;No dependent loops;Seal intact 10/10/2017  8:00 AM  Collection Container Standard drainage bag 10/10/2017  8:00 AM  Securement Method Tape  10/10/2017  8:00 AM  Urinary Catheter Interventions Unclamped 10/10/2017  8:00 AM  Output (mL) 1100 mL 10/10/2017  6:01 AM    Anti-infectives:  Anti-infectives (From admission, onward)   Start     Dose/Rate Route Frequency Ordered Stop   10/10/17 2200  ceFEPIme (MAXIPIME) 2 g in sodium chloride 0.9 % 100 mL IVPB  Status:  Discontinued     2 g 200 mL/hr over 30 Minutes Intravenous Every 12 hours 10/10/17 1557 10/12/17 0845   10/07/17 0600  ceFEPIme (MAXIPIME) 2 g in sodium chloride 0.9 % 100 mL IVPB  Status:  Discontinued     2 g 200 mL/hr over 30 Minutes Intravenous Every 24 hours 10/07/17 0050 10/07/17 0210   10/07/17 0600  vancomycin (VANCOCIN) 1,500 mg in sodium chloride 0.9 % 500 mL IVPB  Status:  Discontinued     1,500 mg 250 mL/hr over 120 Minutes Intravenous Every 24 hours 10/07/17 0050 10/07/17 1136   10/07/17 0600  ceFEPIme (MAXIPIME) 2 g in sodium chloride 0.9 % 100 mL IVPB  Status:  Discontinued     2 g 200 mL/hr over 30 Minutes Intravenous Every 12 hours 10/07/17 0210 10/09/17 1124   10/06/17 2130  ceFEPIme (MAXIPIME) 1 g in sodium chloride 0.9 % 100 mL IVPB     1 g 200 mL/hr over 30 Minutes Intravenous  Once 10/06/17 2126 10/07/17 0016   10/06/17 2130  vancomycin (VANCOCIN) IVPB 1000 mg/200 mL premix     1,000 mg 200 mL/hr over 60 Minutes Intravenous  Once 10/06/17 2126 10/07/17 0115      Microbiology: Results for orders placed or performed during the  hospital encounter of 10/06/17  Blood culture (routine x 2)     Status: None   Collection Time: 10/06/17 10:43 PM  Result Value Ref Range Status   Specimen Description BLOOD LEFT HAND   Final   Special Requests   Final    BOTTLES DRAWN AEROBIC AND ANAEROBIC Blood Culture adequate volume   Culture   Final    NO GROWTH 5 DAYS Performed at [facility], [address] [place], [place] [address]    Report Status 10/11/2017 FINAL  Final  Blood culture (routine x 2)     Status: None   Collection Time: 10/06/17  10:44 PM  Result Value Ref Range Status   Specimen Description BLOOD BLOOD LEFT FOREARM  Final   Special Requests   Final    BOTTLES DRAWN AEROBIC AND ANAEROBIC Blood Culture adequate volume   Culture   Final    NO GROWTH 5 DAYS Performed at [facility], [address] [place], [place] [address]    Report Status 10/11/2017 FINAL  Final  MRSA PCR Screening     Status: None   Collection Time: 10/07/17  1:07 AM  Result Value Ref Range Status   MRSA by PCR NEGATIVE NEGATIVE Final    Comment:        The GeneXpert MRSA Assay (FDA approved for NASAL specimens only), is one component of a comprehensive MRSA colonization surveillance program. It is not intended to diagnose MRSA infection nor to guide or monitor treatment for MRSA infections. Performed at [facility], [address], [place], [place] [address]   Urine Culture     Status: None   Collection Time: 10/07/17  2:24 AM  Result Value Ref Range Status   Specimen Description   Final    URINE, RANDOM Performed at [facility], [address], [place], [place] [address]    Special Requests   Final    NONE Performed at [facility], [address] [place], [place] [address]    Culture   Final    NO GROWTH Performed at [facility], [address], [place], [place] [address]    Report Status 10/08/2017 FINAL  Final  Aerobic/Anaerobic Culture (surgical/deep wound)     Status: None   Collection Time: 10/07/17  2:24 AM  Result Value Ref Range Status   Specimen Description TOE LEFT  Final   Special Requests WOUND  Final   Gram Stain   Final    RARE SQUAMOUS EPITHELIAL CELLS PRESENT NO WBC SEEN MODERATE GRAM POSITIVE RODS FEW GRAM POSITIVE COCCI RARE GRAM NEGATIVE RODS    Culture   Final    FEW METHICILLIN RESISTANT STAPHYLOCOCCUS AUREUS WITHIN MIXED ORGANISMS NO ANAEROBES ISOLATED Performed at [facility], [address] [place], [place] [address]    Report  Status 10/12/2017 FINAL  Final   Organism ID, Bacteria METHICILLIN RESISTANT STAPHYLOCOCCUS AUREUS  Final      Susceptibility   Methicillin resistant staphylococcus aureus - MIC*    CIPROFLOXACIN <=0.5 SENSITIVE Sensitive     ERYTHROMYCIN >=8 RESISTANT Resistant     GENTAMICIN <=0.5 SENSITIVE Sensitive     OXACILLIN >=4 RESISTANT Resistant     TETRACYCLINE <=1 SENSITIVE Sensitive     VANCOMYCIN 1 SENSITIVE Sensitive     TRIMETH/SULFA <=10 SENSITIVE Sensitive     CLINDAMYCIN <=0.25 SENSITIVE Sensitive     RIFAMPIN <=0.5 SENSITIVE Sensitive     Inducible Clindamycin NEGATIVE Sensitive     *  FEW METHICILLIN RESISTANT STAPHYLOCOCCUS AUREUS  Aerobic/Anaerobic Culture (surgical/deep wound)     Status: Abnormal   Collection Time: 10/07/17  2:24 AM  Result Value Ref Range Status   Specimen Description   Final    LEG RIGHT Performed at [facility], [address], [place], [place] [address]    Special Requests   Final    NONE Performed at [facility], [address] [place], [place] [address]    Gram Stain   Final    NO WBC SEEN MODERATE GRAM NEGATIVE RODS FEW GRAM POSITIVE RODS RARE GRAM POSITIVE COCCI Performed at [facility], [address] [place], [place] [address]    Culture (A)  Final    MULTIPLE ORGANISMS PRESENT, NONE PREDOMINANT MIXED ANAEROBIC FLORA PRESENT.  CALL LAB IF FURTHER IID REQUIRED.    Report Status 10/12/2017 FINAL  Final   Studies: Dg Chest 1 View  Result Date: 10/07/2017 CLINICAL DATA:  Feeding tube, evaluate placement of endotracheal tube EXAM: CHEST  1 VIEW COMPARISON:  Portable chest x-ray of 10/06/2016 FINDINGS: The tip of the endotracheal tube is approximately 3.9 cm above the carina. The lungs remain poorly aerated and cardiomegaly is stable. Haziness at both lung bases may reflect atelectasis and effusion although pneumonia cannot be excluded. NG tube extends below the hemidiaphragm. IMPRESSION: 1. Endotracheal tube tip 3.9 cm  above the carina. 2. Poor aeration with basilar volume loss, probable effusions, and pneumonia cannot be excluded. 3. Stable cardiomegaly. 4. NG tube extends below the hemidiaphragm. Electronically Signed   By: Dwyane Dee M.D.   On: 10/07/2017 14:03   Dg Abd 1 View  Result Date: 10/07/2017 CLINICAL DATA:  Evaluate position of feeding tube EXAM: ABDOMEN - 1 VIEW COMPARISON:  Abdomen 10/07/2016 FINDINGS: A tip of the feeding tube is seen within the fundus dex proximal body of the stomach. Bowel gas pattern is nonspecific. Some volume loss is again noted at the lung bases and cardiomegaly is stable IMPRESSION: Feeding tube tip extends to the region of the fundus-proximal stomach. The bowel gas pattern is nonspecific Electronically Signed   By: Dwyane Dee M.D.   On: 10/07/2017 14:04   Dg Abd 1 View  Result Date: 10/07/2017 CLINICAL DATA:  84 year old male with nasogastric tube placement. Subsequent encounter. EXAM: ABDOMEN - 1 VIEW COMPARISON:  10/07/2017 2:36 a.m. FINDINGS: Nasogastric tube radiopaque marker can only be well delineated to the level of the distal esophagus. Tip of the nasogastric tube may enter the stomach but is not adequately assessed secondary to overlying leads in the left upper quadrant. Removal of leads or change of lead position and repeat exam may prove helpful for further delineation. Nonspecific bowel gas pattern. IMPRESSION: Nasogastric tube radiopaque marker can only be well delineated to the level of the distal esophagus. Tip of the nasogastric tube may enter the stomach but is not adequately assessed secondary to overlying leads in the left upper quadrant. Removal of leads or change of lead position and repeat exam may prove helpful for further delineation. These results will be called to the ordering clinician or representative by the Radiologist Assistant, and communication documented in the PACS or zVision Dashboard. Electronically Signed   By: Lacy Duverney M.D.   On:  10/07/2017 08:32   Dg Abd 1 View  Result Date: 10/07/2017 CLINICAL DATA:  OG tube placement, second attempt EXAM: ABDOMEN - 1 VIEW COMPARISON:  10/07/2017 at 0228 hours FINDINGS: Enteric tube terminates in the gastric cardia  with its side port at/just above the GE junction. IMPRESSION: Enteric tube terminates in the gastric cardia with its side port at/just above the GE junction. Electronically Signed   By: Charline Bills M.D.   On: 10/07/2017 02:48   Dg Abd 1 View  Result Date: 10/07/2017 CLINICAL DATA:  OG tube placement EXAM: ABDOMEN - 1 VIEW COMPARISON:  10/06/2017 FINDINGS: Enteric tube terminates at the GE junction with the side port in the distal esophagus. IMPRESSION: Enteric tube terminates at the GE junction with the side port in the distal esophagus. Electronically Signed   By: Charline Bills M.D.   On: 10/07/2017 02:47   Dg Abd 1 View  Result Date: 10/06/2017 CLINICAL DATA:  OG tube EXAM: ABDOMEN - 1 VIEW COMPARISON:  None. FINDINGS: By history patient has an OG tube. Just to the RIGHT of the thoracic spine, there is a radiopaque line, possibly an orogastric tube. However this is slightly to the RIGHT of the expected course of the esophagus and does not extend into the LEFT UPPER QUADRANT. It is possible that orogastric tube has been withdrawn or removed and not included on the film. There is dense opacification of the LEFT lung base. Visualized bowel gas pattern is nonobstructive. IMPRESSION: Possible orogastric tube to the distal esophagus, but this appears slightly to the RIGHT of the expected course of the esophagus. Recommend confirmation of orogastric tube placement and consider advancing tube further into the stomach if this is felt to be the orogastric tube. Significant LEFT LOWER lobe opacity. Electronically Signed   By: Norva Pavlov M.D.   On: 10/06/2017 21:00   Ct Head Wo Contrast  Result Date: 10/09/2017 CLINICAL DATA:  Question anoxic brain injury.  Cardiac arrest  EXAM: CT HEAD WITHOUT CONTRAST TECHNIQUE: Contiguous axial images were obtained from the base of the skull through the vertex without intravenous contrast. 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 Small remote lateral left frontal infarct best seen on reformats. There is generalized atrophy. Small remote left cerebellar infarct. No detected acute infarct or swelling. No hemorrhage, hydrocephalus, or collection. Vascular: Atherosclerotic calcification.  No hyperdense vessel. Skull: Remote left frontal craniotomy.  No acute finding Sinuses/Orbits: No acute finding IMPRESSION: 1. No evident anoxic injury. 2. 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 Electronically Signed   By: Marnee Spring M.D.   On: 10/09/2017 14:46   Ct Head Wo Contrast  Result Date: 10/06/2017 CLINICAL DATA:  Altered mental status (AMS), unclear cause; C-spine trauma, high clinical risk (NEXUS/CCR). Collapsed in the bathroom. EXAM: CT HEAD WITHOUT CONTRAST CT CERVICAL SPINE WITHOUT CONTRAST TECHNIQUE: Multidetector CT imaging of the head and cervical spine was performed following the standard protocol without intravenous contrast. Multiplanar CT image reconstructions of the cervical spine were also generated. COMPARISON:  Head CT 06/17/2017 FINDINGS: CT HEAD FINDINGS Brain: Unchanged degree of atrophy and chronic small vessel ischemia. Encephalomalacia in the left parietal lobe again seen. No intracranial hemorrhage, mass effect, or midline shift. No hydrocephalus. The basilar cisterns are patent. No evidence of territorial infarct or acute ischemia. No extra-axial or intracranial fluid collection. Vascular: Atherosclerosis of skullbase vasculature without hyperdense vessel or abnormal calcification. Skull: Prior left frontoparietal craniotomy.  No skull fracture. Sinuses/Orbits: Paranasal sinuses and mastoid air cells are clear. The visualized orbits are unremarkable. Bilateral  cataract resection. Other: None. CT CERVICAL SPINE FINDINGS Alignment: Straightening of normal cervical lordosis. Skull base and vertebrae: No acute fracture. Bulky anterior osteophytes from C3-C4  through C6-C7, well corticated lucency through the osteophytes at C4 and C5 may represent remote prior trauma. The dens and skull base are intact. Soft tissues and spinal canal: Debris in the hypopharynx limits assessment for prevertebral edema, no gross prevertebral soft tissue thickening. No evidence of canal hematoma. Disc levels: Near complete disc space loss at C2-C3 with bony ankylosis of the posterior elements on the left. Disc space narrowing at C5-C6 and C6-C7. 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 Bulky anterior osteophytes throughout, some of which are fragmented. Multilevel facet arthropathy. Upper chest: Moderate right pleural effusion, partially included. Other: Soft tissue prominence involving the right aspect of the hypopharynx is incompletely included in the field of view, causing mild leftward endotracheal and enteric tube deviation. There are dense vascular calcifications. IMPRESSION: 1.  No acute intracranial abnormality.  No skull fracture. 2. Unchanged atrophy, chronic small vessel ischemia, and left parietal encephalomalacia. 3. Advanced multilevel degenerative change in the cervical spine without evidence of acute fracture. 4. Soft tissue prominence involving the right hypopharynx is only partially included in the field of view, this may represent retained secretions or asymmetric positioning of the patient's tongue, however underlying mass lesion is not excluded. Consider contrast enhanced CT of the neck as clinically indicated. 5. Moderate right pleural effusion partially included in the field of view. Electronically Signed   By: Narda Rutherford M.D.   On: 10/06/2017 21:38   Ct Cervical Spine Wo Contrast  Result Date: 10/06/2017 CLINICAL DATA:  Altered mental status  (AMS), unclear cause; C-spine trauma, high clinical risk (NEXUS/CCR). Collapsed in the bathroom. EXAM: CT HEAD WITHOUT CONTRAST CT CERVICAL SPINE WITHOUT CONTRAST TECHNIQUE: Multidetector CT imaging of the head and cervical spine was performed following the standard protocol without intravenous contrast. Multiplanar CT image reconstructions of the cervical spine were also generated. COMPARISON:  Head CT 06/17/2017 FINDINGS: CT HEAD FINDINGS Brain: Unchanged degree of atrophy and chronic small vessel ischemia. Encephalomalacia in the left parietal lobe again seen. No intracranial hemorrhage, mass effect, or midline shift. No hydrocephalus. The basilar cisterns are patent. No evidence of territorial infarct or acute ischemia. No extra-axial or intracranial fluid collection. Vascular: Atherosclerosis of skullbase vasculature without hyperdense vessel or abnormal calcification. Skull: Prior left frontoparietal craniotomy.  No skull fracture. Sinuses/Orbits: Paranasal sinuses and mastoid air cells are clear. The visualized orbits are unremarkable. Bilateral cataract resection. Other: None. CT CERVICAL SPINE FINDINGS Alignment: Straightening of normal cervical lordosis. Skull base and vertebrae: No acute fracture. Bulky anterior osteophytes from C3-C4 through C6-C7, well corticated lucency through the osteophytes at C4 and C5 may represent remote prior trauma. The dens and skull base are intact. Soft tissues and spinal canal: Debris in the hypopharynx limits assessment for prevertebral edema, no gross prevertebral soft tissue thickening. No evidence of canal hematoma. Disc levels: Near complete disc space loss at C2-C3 with bony ankylosis of the posterior elements on the left. Disc space narrowing at C5-C6 and C6-C7. 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 Bulky anterior osteophytes throughout, some of which are fragmented. Multilevel facet arthropathy. Upper chest: Moderate right pleural effusion,  partially included. Other: Soft tissue prominence involving the right aspect of the hypopharynx is incompletely included in the field of view, causing mild leftward endotracheal and enteric tube deviation. There are dense vascular calcifications. IMPRESSION: 1.  No acute intracranial abnormality.  No skull fracture. 2. Unchanged atrophy, chronic small vessel ischemia, and left parietal encephalomalacia. 3. Advanced multilevel degenerative change in the cervical spine without evidence  of acute fracture. 4. Soft tissue prominence involving the right hypopharynx is only partially included in the field of view, this may represent retained secretions or asymmetric positioning of the patient's tongue, however underlying mass lesion is not excluded. Consider contrast enhanced CT of the neck as clinically indicated. 5. Moderate right pleural effusion partially included in the field of view. Electronically Signed   By: Narda Rutherford M.D.   On: 10/06/2017 21:38   Mr Brain Wo Contrast  Result Date: 10/10/2017 CLINICAL DATA:  Altered mental status. EXAM: MRI HEAD WITHOUT CONTRAST TECHNIQUE: Multiplanar, multiecho pulse sequences of the brain and surrounding structures were obtained without intravenous contrast. COMPARISON:  Head CT 10/09/2017 FINDINGS: The study is mildly motion degraded. Brain: There is no evidence of acute infarct, intracranial hemorrhage, mass, midline shift, or extra-axial fluid collection. Small chronic infarcts are again noted in the left parietal lobe and left cerebellum. There is also minimal cortical encephalomalacia laterally in the left frontal lobe subjacent to the craniotomy. Bilateral periventricular white matter T2 hyperintensities are nonspecific but compatible with mild chronic small vessel ischemic disease. There is mild-to-moderate cerebral atrophy. Vascular: Major intracranial vascular flow voids are preserved. Skull and upper cervical spine: Left frontoparietal craniotomy. Upper  cervical disc degeneration. Sinuses/Orbits: Bilateral cataract extraction. Mild bilateral maxillary and sphenoid sinus mucosal thickening. Large bilateral mastoid effusions and fluid in the pharynx in the setting of intubation. Other: None. IMPRESSION: 1. No acute intracranial abnormality. 2. Chronic left parietal, left frontal, and left cerebellar infarcts. 3. Mild chronic small vessel ischemia in the cerebral white matter. Electronically Signed   By: Sebastian Ache M.D.   On: 10/10/2017 16:52   Dg Pelvis Portable  Result Date: 10/10/2017 CLINICAL DATA:  MRI screening for metal EXAM: PORTABLE PELVIS 1-2 VIEWS COMPARISON:  10/07/2017, 11/07/2009 radiographs FINDINGS: No metallic density radiopaque foreign body is seen within the included portions of the bony pelvis and overlying soft tissues. SI joints are non widened. There is a catheter or probe inferior to the pubic symphysis. No fracture or malalignment although right femoral neck is obscured by the trochanter. Phleboliths in the right pelvis. There may be chronic avulsion injury at the left ischium. IMPRESSION: Negative. Electronically Signed   By: Jasmine Pang M.D.   On: 10/10/2017 15:27   Dg Chest Port 1 View  Result Date: 10/11/2017 CLINICAL DATA:  Ventilator EXAM: PORTABLE CHEST 1 VIEW COMPARISON:  10/09/2017 FINDINGS: Endotracheal tube is 9 cm above the carina. Cardiomegaly with vascular congestion. Bilateral perihilar lower lobe opacities and layering effusions are similar prior study. NG tube is in the stomach. IMPRESSION: Bilateral layering effusions with cardiomegaly and bilateral perihilar and lower lobe airspace opacities, likely edema and/or atelectasis. Electronically Signed   By: Charlett Nose M.D.   On: 10/11/2017 08:26   Dg Chest Port 1 View  Result Date: 10/09/2017 CLINICAL DATA:  Acute respiratory failure. EXAM: PORTABLE CHEST 1 VIEW COMPARISON:  Radiograph 10/07/2017 FINDINGS: Endotracheal tube 4.4 cm from the carina. Enteric tube  in place with tip and side-port below the diaphragm. Cardiomegaly is unchanged. Worsening hazy opacities throughout both lungs. Bibasilar volume loss again seen with associated effusions. No pneumothorax. IMPRESSION: Worsening lung aeration with increasing hazy opacities throughout both lungs, likely combination of pleural fluid, atelectasis, and pulmonary edema. Electronically Signed   By: Narda Rutherford M.D.   On: 10/09/2017 04:06   Dg Chest Portable 1 View  Result Date: 10/06/2017 CLINICAL DATA:  Ett tube EXAM: PORTABLE CHEST 1 VIEW COMPARISON:  06/20/2017 FINDINGS:  Interval placement of endotracheal tube, tip approximately 3.0 centimeters above the carina. An orogastric tube is in place, tip overlying the level of the LOWER esophagus but difficult to confirm. The heart is enlarged. There is dense opacity in the LEFT lung base. Perihilar opacities are consistent with pulmonary edema. IMPRESSION: Interval placement of endotracheal tube and orogastric tube. LEFT LOWER lobe opacity and pulmonary edema. Electronically Signed   By: Norva Pavlov M.D.   On: 10/06/2017 21:02   Dg Abd Portable 1v  Result Date: 10/07/2017 CLINICAL DATA:  Orogastric tube placement. EXAM: PORTABLE ABDOMEN - 1 VIEW COMPARISON:  Radiograph of same day. FINDINGS: The bowel gas pattern is normal. Distal tip of nasogastric tube is seen in proximal stomach, with side hole at expected position of gastroesophageal junction. No radio-opaque calculi or other significant radiographic abnormality are seen. IMPRESSION: Distal tip of nasogastric tube seen in proximal stomach, with side hole at expected position of gastroesophageal junction. Advancement is recommended. Electronically Signed   By: Lupita Raider, M.D.   On: 10/07/2017 12:18    Consults: Treatment Team:  Pccm, Raymond Gurney, MD Laurier Nancy, MD Kym Groom, MD   Subjective:    Overnight Issues: No significant changes in the last 24 hours, weaned off of  sedation, status post EEG and MRI.  Appreciate neurology's consultation  Objective:  Vital signs for last 24 hours: Temp:  [97.5 F (36.4 C)-98.9 F (37.2 C)] 97.5 F (36.4 C) (10/07 0400) Pulse Rate:  [57-69] 59 (10/07 0700) Resp:  [9-20] 16 (10/07 0700) BP: (141-179)/(47-68) 176/68 (10/07 0700) SpO2:  [95 %-99 %] 98 % (10/07 0700) FiO2 (%):  [30 %] 30 % (10/07 0312) Weight:  [124.7 kg] 124.7 kg (10/07 0456)  Hemodynamic parameters for last 24 hours:    Intake/Output from previous day: 10/06 0701 - 10/07 0700 In: 1871.2 [I.V.:881.2; NG/GT:990] Out: 3500 [Urine:3500]  Intake/Output this shift: No intake/output data recorded.  Vent settings for last 24 hours: Vent Mode: PRVC FiO2 (%):  [30 %] 30 % Set Rate:  [6 bmp-16 bmp] 16 bmp Vt Set:  [500 mL] 500 mL PEEP:  [2 cmH20-5 cmH20] 5 cmH20 Plateau Pressure:  [22 cmH20-25 cmH20] 25 cmH20  Physical Exam:  Vital signs: Please see the above listed vital signs HEENT: Trachea midline, orally intubated, no accessory muscle utilization Cardiovascular: Regular rate and rhythm Abdominal: Positive bowel sounds, soft exam Extremities: No clubbing cyanosis, 1+ edema appreciated Neurologic: Patient is sedated, will track with eyes but no purposeful response  Assessment/Plan:   Status post cardiac arrest.  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  Being followed by neurology.  Post EEG and MRI, please see reports, patient is off sedation, appreciate neurology input  Hypoxemic/hypercapnic respiratory failure.  Patient remains on mechanical ventilation, weaning has been limited secondary to altered mental status post cardiac arrest.    Renal failure.  Pending a.m. Labs  Anemia.  No evidence of active bleeding  Wound culture positive for staph with some methicillin-resistant species  Critical Care Total Time 35 minutes  Carrah Eppolito 10/13/2017  *Care during the  described time interval was provided by me and/or other providers on the critical care team.  I have reviewed this patient's available data, including medical history, events of note, physical examination and test results as part of my evaluation. Patient ID: Billy Liu, male   DOB: 1932/06/22, 84 y.o.   MRN: [id number] Patient ID: Billy Liu, male  DOB: Nov 02, 1932, 84 y.o.   MRN: [id number] Patient ID: Billy Liu, male   DOB: 03/27/1932, 84 y.o.   MRN: [id number]

## 2017-10-13 NOTE — Consult Note (Signed)
Pharmacy Antibiotic Note  Billy Liu is a 84 y.o. male admitted on 10/06/2017 with cardiac arrest. Patient currently intubated with brief self-extubation on 10/7 and witnessed aspiration event. Patient with MRSA growing on left toe. Patient with allergy history significant for penicillin; no noted penicillin challenge in patient's medical history. Pharmacy has been consulted for Vancomycin + Meropenem dosing.  Plan: Initiate Vancomycin 1250 mg x 1, then 6 hours later, Vancomycin 1250 mg every 8 hours. Vancomycin trough has been ordered 30 minutes prior to 4th dose (10/8 @ 1230).  Initiate Merrem 1 g every 8 hours.  There is potential for drug-drug interaction with Merrem + valproic acid - Merrem decreased VPA levels resulting in possible breakthrough seizures; however, patient takes VPA for behavioral per [facility] records.   Kinetics Using Adjusted BW = 100.6 kg ke 0.083 Vd 70.4 T1/2 8.4 hours Cmin 17.68   Height: 6\' 3"  (190.5 cm) Weight: 274 lb 14.6 oz (124.7 kg) IBW/kg (Calculated) : 84.5  Temp (24hrs), Avg:97.9 F (36.6 C), Min:97.5 F (36.4 C), Max:98.9 F (37.2 C)  Recent Labs  Lab 10/08/17 0423 10/09/17 0331 10/10/17 0340 10/11/17 0516 10/12/17 1728 10/13/17 0904  WBC 6.7 6.5 6.7 9.5  --  6.1  CREATININE 1.49* 1.26* 1.19 0.95 0.91 0.84    Estimated Creatinine Clearance: 94.8 mL/min (by C-G formula based on SCr of 0.84 mg/dL).    Allergies  Allergen Reactions  . Penicillins Other (See Comments)    Per MAR Has patient had a PCN reaction causing immediate rash, facial/tongue/throat swelling, SOB or lightheadedness with hypotension: Unknown Has patient had a PCN reaction causing severe rash involving mucus membranes or skin necrosis: Unknown Has patient had a PCN reaction that required hospitalization: Unknown Has patient had a PCN reaction occurring within the last 10 years: Unknown If all of the above answers are "NO", then may proceed with Cephalosporin use.      Antimicrobials this admission: Cefepime 0930 >> 10/6 Vancomycin 0930 >> 10/1, 10/7 >> Meropenem 10/7 >>   Microbiology results: 0930 BCx: NGTD 10/1 UCx: NGTD  10/1 Wound Cx toe - MRSA and resistance to erythromycin 10/1Wound Cx leg - multiple organisms; anaerobic 10/1 MRSA PCR: (-)  Thank you for allowing pharmacy to be a part of this patient's care.  Pharmacy will continue to monitor.  Mauri Reading, PharmD Pharmacy Resident  10/13/2017 1:46 PM

## 2017-10-13 NOTE — Progress Notes (Signed)
Patient ID: Billy Liu, male   DOB: 07-08-1932, 84 y.o.   MRN: [id number]  [employer] PROGRESS NOTE  Billy Liu JSR:[id number] DOB: 08-17-1932 DOA: 10/06/2017 PCP: Alvester Morin, MD  HPI/Subjective: Patient remains intubated   Objective: Vitals:   10/13/17 1200 10/13/17 1256  BP: (!) 159/59   Pulse: (!) 55   Resp: 13   Temp: 98 F (36.7 C)   SpO2: 95% 98%    Filed Weights   10/11/17 0354 10/12/17 0325 10/13/17 0456  Weight: 126.4 kg 125.9 kg 124.7 kg    ROS: Review of Systems  Unable to perform ROS: Acuity of condition   Exam: Physical Exam  Constitutional: He is intubated.  HENT:  Nose: Mucosal edema present.  Unable to look into mouth  Eyes: Conjunctivae and lids are normal.  Pupils pinpoint  Neck: Carotid bruit is not present.  Cardiovascular: Regular rhythm, S1 normal, S2 normal and normal heart sounds.  Respiratory: He is intubated. He has decreased breath sounds in the right lower field and the left lower field. He has no wheezes. He has rhonchi in the right lower field and the left lower field.  GI: Bowel sounds are normal. There is no tenderness.  Musculoskeletal:       Right ankle: He exhibits swelling.       Left ankle: He exhibits swelling.  Neurological:  Opens eyes and moves arms with stimulation  Skin: Skin is warm.  Right posterior calf large area of greenish discoloration and foul-smelling in an ulcer.  Left foot first toe with small ulceration and very swollen first toe.  Likely from chronic infection  Psychiatric:  Opens eyes and moves his arms to stimulation      Data Reviewed: Basic Metabolic Panel: Recent Labs  Lab 10/07/17 0127  10/08/17 0423 10/09/17 0331 10/10/17 0340 10/11/17 0516 10/12/17 0625 10/12/17 1728 10/13/17 0452 10/13/17 0904  NA  --    < > 135 140 141 144  --  144  --  145  K  --    < > 4.6 4.8 5.0 5.0 5.2* 5.1 5.1 4.9  CL  --    < > 103 106 110 111  --  113*  --  110  CO2  --    < > _0 --  28  --  28  GLUCOSE  --    < > 122* 184* 255* 253*  --  255*  --  194*  BUN  --    < > 51* 48* 49* 45*  --  47*  --  39*  CREATININE  --    < > 1.49* 1.26* 1.19 0.95  --  0.91  --  0.84  CALCIUM  --    < > 7.8* 8.0* 8.0* 8.4*  --  8.5*  --  8.7*  MG 2.8*  --  2.7*  --   --   --   --  2.7*  --   --   PHOS 5.0*  --  4.4  --   --   --   --   --   --   --    < > = values in this interval not displayed.   Liver Function Tests: Recent Labs  Lab 10/06/17 2016 10/13/17 0904  AST 51* 18  ALT 25 17  ALKPHOS 91 60  BILITOT 0.5 0.3  PROT 7.0 6.4*  ALBUMIN 3.5 2.4*   CBC: Recent Labs  Lab 10/06/17 2016 10/07/17 0606  10/08/17 0423 10/08/17 1120 10/09/17 0331 10/10/17 0340 10/11/17 0516 10/13/17 0904  WBC 9.4 5.7 6.7  --  6.5 6.7 9.5 6.1  NEUTROABS 6.3 4.5  --   --   --   --  7.6* 4.4  HGB 6.3* 6.5* 6.9* 8.3* 8.1* 8.5* 8.5* 8.8*  HCT 20.4* 19.7* 20.7* 24.9* 24.1* 26.0* 27.8* 28.0*  MCV 85.6 82.5 83.7  --  84.9 86.1 85.8 86.1  PLT 333 224 238  --  231 244 267 249   Cardiac Enzymes: Recent Labs  Lab 10/06/17 2016 10/07/17 0127 10/07/17 0806 10/07/17 1436 10/07/17 2016  TROPONINI <0.03 <0.03 <0.03 <0.03 <0.03    CBG: Recent Labs  Lab 10/12/17 2000 10/12/17 2357 10/13/17 0351 10/13/17 0723 10/13/17 1118  GLUCAP 195* 200* 216* 206* 167*    Recent Results (from the past 240 hour(s))  Blood culture (routine x 2)     Status: None   Collection Time: 10/06/17 10:43 PM  Result Value Ref Range Status   Specimen Description BLOOD LEFT HAND   Final   Special Requests   Final    BOTTLES DRAWN AEROBIC AND ANAEROBIC Blood Culture adequate volume   Culture   Final    NO GROWTH 5 DAYS Performed at [facility], [address] [place], [place] [address]    Report Status 10/11/2017 FINAL  Final  Blood culture (routine x 2)     Status: None   Collection Time: 10/06/17 10:44 PM  Result Value Ref Range Status   Specimen Description BLOOD BLOOD LEFT  FOREARM  Final   Special Requests   Final    BOTTLES DRAWN AEROBIC AND ANAEROBIC Blood Culture adequate volume   Culture   Final    NO GROWTH 5 DAYS Performed at [facility], [address] [place], [place] [address]    Report Status 10/11/2017 FINAL  Final  MRSA PCR Screening     Status: None   Collection Time: 10/07/17  1:07 AM  Result Value Ref Range Status   MRSA by PCR NEGATIVE NEGATIVE Final    Comment:        The GeneXpert MRSA Assay (FDA approved for NASAL specimens only), is one component of a comprehensive MRSA colonization surveillance program. It is not intended to diagnose MRSA infection nor to guide or monitor treatment for MRSA infections. Performed at [facility], [address], [place], [place] [address]   Urine Culture     Status: None   Collection Time: 10/07/17  2:24 AM  Result Value Ref Range Status   Specimen Description   Final    URINE, RANDOM Performed at [facility], [address], [place], [place] [address]    Special Requests   Final    NONE Performed at [facility], [address] [place], [place] [address]    Culture   Final    NO GROWTH Performed at [facility], [place] [address] [place], [place] [address]    Report Status 10/08/2017 FINAL  Final  Aerobic/Anaerobic Culture (surgical/deep wound)     Status: None   Collection Time: 10/07/17  2:24 AM  Result Value Ref Range Status   Specimen Description TOE LEFT  Final   Special Requests WOUND  Final   Gram Stain   Final    RARE SQUAMOUS EPITHELIAL CELLS PRESENT NO WBC SEEN MODERATE GRAM POSITIVE RODS FEW GRAM POSITIVE COCCI RARE GRAM NEGATIVE RODS    Culture   Final  FEW METHICILLIN RESISTANT STAPHYLOCOCCUS AUREUS WITHIN MIXED ORGANISMS NO ANAEROBES ISOLATED Performed at [facility], [place] [address] [place], [place] [address]    Report Status 10/12/2017 FINAL  Final   Organism ID, Bacteria METHICILLIN RESISTANT  STAPHYLOCOCCUS AUREUS  Final      Susceptibility   Methicillin resistant staphylococcus aureus - MIC*    CIPROFLOXACIN <=0.5 SENSITIVE Sensitive     ERYTHROMYCIN >=8 RESISTANT Resistant     GENTAMICIN <=0.5 SENSITIVE Sensitive     OXACILLIN >=4 RESISTANT Resistant     TETRACYCLINE <=1 SENSITIVE Sensitive     VANCOMYCIN 1 SENSITIVE Sensitive     TRIMETH/SULFA <=10 SENSITIVE Sensitive     CLINDAMYCIN <=0.25 SENSITIVE Sensitive     RIFAMPIN <=0.5 SENSITIVE Sensitive     Inducible Clindamycin NEGATIVE Sensitive     * FEW METHICILLIN RESISTANT STAPHYLOCOCCUS AUREUS  Aerobic/Anaerobic Culture (surgical/deep wound)     Status: Abnormal   Collection Time: 10/07/17  2:24 AM  Result Value Ref Range Status   Specimen Description   Final    LEG RIGHT Performed at [facility] [address], [place], [place] [address]    Special Requests   Final    NONE Performed at [facility], [address], [place] [address]    Gram Stain   Final    NO WBC SEEN MODERATE GRAM NEGATIVE RODS FEW GRAM POSITIVE RODS RARE GRAM POSITIVE COCCI Performed at [facility], [place] [address], [place], [place] [address]    Culture (A)  Final    MULTIPLE ORGANISMS PRESENT, NONE PREDOMINANT MIXED ANAEROBIC FLORA PRESENT.  CALL LAB IF FURTHER IID REQUIRED.    Report Status 10/12/2017 FINAL  Final     Studies: Dg Chest Port 1 View  Result Date: 10/13/2017 CLINICAL DATA:  Endotracheal and orogastric tubes. EXAM: PORTABLE CHEST 1 VIEW COMPARISON:  Earlier today. FINDINGS: The endotracheal tube tip is unchanged, 3.9 cm above the carina. Orogastric tube extending into the stomach. Stable enlarged cardiac silhouette. Mild decrease in prominence of the pulmonary vasculature and interstitial markings. Poorly defined probable small bilateral pleural effusions. Interval linear density at the left lung base and poorly defined increased density with volume loss at the right lung base. IMPRESSION:  1. Mildly improved changes of congestive heart failure. 2. Interval bibasilar atelectasis. Electronically Signed   By: Claudie Revering M.D.   On: 10/13/2017 13:33   Dg Chest Port 1 View  Result Date: 10/13/2017 CLINICAL DATA:  On mechanically assisted ventilation, CHF, COPD EXAM: PORTABLE CHEST 1 VIEW COMPARISON:  Portable chest x-ray of 10/11/2017 and 10/09/2017 FINDINGS: The tip of the endotracheal tube is approximately 3.8 cm above the carina. Bilateral pleural effusions again are noted with some airspace disease most consistent with congestive heart failure and cardiomegaly is noted. NG tube extends below the hemidiaphragm. There are diffuse degenerative changes throughout the thoracic spine. IMPRESSION: 1. Little change in probable CHF with effusions, cardiomegaly and pulmonary edema. 2. Tip of endotracheal tube 3.8 cm above the carina. Electronically Signed   By: Ivar Drape M.D.   On: 10/13/2017 08:52   Dg Abd Portable 1v  Result Date: 10/13/2017 CLINICAL DATA:  Orogastric tube placement. EXAM: PORTABLE ABDOMEN - 1 VIEW COMPARISON:  Portable chest obtained at the same time. FINDINGS: Orogastric tube tip in the mid to distal stomach and side hole in the proximal to mid stomach. The included portion of the bowel gas pattern is normal. See the portable chest report  for the chest findings. Lumbar and thoracic spine degenerative changes. IMPRESSION: Orogastric tube tip and side hole in the stomach. Electronically Signed   By: Claudie Revering M.D.   On: 10/13/2017 13:34    Scheduled Meds: . ALPRAZolam  0.5 mg Oral TID  . chlorhexidine gluconate (MEDLINE KIT)  15 mL Mouth Rinse BID  . etomidate  20 mg Intravenous Once  . famotidine  20 mg Per Tube BID  . feeding supplement (PRO-STAT SUGAR FREE 64)  60 mL Per Tube QID  . feeding supplement (VITAL HIGH PROTEIN)  1,000 mL Per Tube Q24H  . heparin  5,000 Units Subcutaneous Q8H  . Influenza vac split quadrivalent PF  0.5 mL Intramuscular Tomorrow-1000  .  insulin aspart  0-20 Units Subcutaneous Q4H  . insulin detemir  10 Units Subcutaneous BID  . mouth rinse  15 mL Mouth Rinse 10 times per day  . multivitamin  15 mL Per Tube Daily  . polyethylene glycol  17 g Per Tube Daily  . senna-docusate  2 tablet Per Tube BID   Continuous Infusions: . sodium chloride Stopped (10/12/17 2141)  . dexmedetomidine (PRECEDEX) IV infusion 1 mcg/kg/hr (10/13/17 1200)  . fentaNYL infusion INTRAVENOUS Stopped (10/13/17 1143)  . meropenem (MERREM) IV    . propofol (DIPRIVAN) infusion 35 mcg/kg/min (10/10/17 0601)  . sodium chloride Stopped (10/11/17 2309)  . valproate sodium 250 mg (10/13/17 1242)  . vancomycin    . vancomycin      Assessment/Plan:  1. Cardiac arrest.  Patient underwent hypothermia protocol.  Continue supportive care.  MRI shows old stroke neurology following 2. Acute hypoxic respiratory failure.  Continue ventilator support.  Continues to require 30% FiO2  3. left lower lobe pneumonia, right lower extremity ulceration with foul-smelling discharge, left first toe ulceration and swelling of the first toe likely chronic infection.  Antibiotics per the ICU team hypotension.  Off pressors 4. Acute kidney injury and hyperkalemia.   This has improved. 5. History of chronic diastolic congestive heart failure 6. Overall prognosis poor and high risk for cardiopulmonary arrest.  Patient listed as full code. 7. Anemia.  Responded to transfusion.    Code Status:     Code Status Orders  (From admission, onward)         Start     Ordered   10/07/17 0132  Full code  Continuous     10/07/17 0131        Code Status History    Date Active Date Inactive Code Status Order ID Comments User Context   10/07/2017 0031 10/07/2017 0131 Full Code [id number]  Awilda Bill, NP ED   06/17/2017 1856 06/23/2017 2244 Full Code [id number]  Nicholes Mango, MD Inpatient   02/18/2017 1623 02/18/2017 2133 Full Code [id number]  Delana Meyer, Dolores Lory, MD Inpatient   02/11/2017  1138 02/11/2017 1736 Full Code [id number]  Schnier, Dolores Lory, MD Inpatient   10/24/2014 1934 10/31/2014 1837 Full Code [id number]  Fritzi Mandes, MD Inpatient   10/11/2014 2347 10/17/2014 1844 Full Code [id number]  Lance Coon, MD Inpatient     Family Communication: As per critical care team Disposition Plan: To be determined  Consultants:  Critical care specialist  Antibiotics:  Cefepime  Time spent: 35 minutes Tracy

## 2017-10-13 NOTE — Progress Notes (Signed)
Patient self extubated and was re-intubated by Dr. Lonn Georgia at [phone]. Patient has a size 7.5 ET Tube placed at 26 at the lip. Bilateral lung sounds present and positive color change on CO2 detector.

## 2017-10-13 NOTE — Progress Notes (Signed)
Patient ID: Montrae Braithwaite, male   DOB: Nov 18, 1932, 84 y.o.   MRN: [id number] Pulmonary/critical care  Patient self extubated Desaturations noted with agonal respirations Proceeded with emergent intubation Patient was already on fentanyl infusion, 20 mg of etomidate given. First visualization with a glide scope was performed a #4 blade The vocal cords were quite anterior and could not pass the endotracheal tube with the glide scope Converted to a #4 [employer] blade, visualization of the vocal cords, pus was noted to be coming from cords 7.5 endotracheal tube was passed Bilateral breath sounds, condensation and endotracheal tube, end-tidal CO2 noted, oxygen saturations 100% Because of thick tenacious secretions proceeded with bronchoscopy Bronchoscope inserted through an adapter on endotracheal tube The endotracheal tube terminated 3 to 4 cm above the carina Thick tenacious mucus secretions were noted in the distal trachea right and left mainstem.  All were cleared Full inspection revealed mucopurulence throughout both lungs Right upper lobe, right middle lobe and right lower lobe were cleared of secretions Left upper and left lower lobe orifice also cleared of secretions Stat portable chest x-ray ordered  Tora Kindred, DO

## 2017-10-13 NOTE — Progress Notes (Signed)
Patient has been restless on and off this shift. Fentanyl and Precedex has been titrated up and down. Pulls at scrotum and Foley when sedation wears off. Has been hypertensive but heart rate has been brady most of the night. Failed spontaneous weaning trial yesterday. Continue to monitor.

## 2017-10-13 NOTE — Progress Notes (Signed)
Inpatient Diabetes Program Recommendations  AACE/ADA: New Consensus Statement on Inpatient Glycemic Control (2015)  Target Ranges:  Prepandial:   less than 140 mg/dL      Peak postprandial:   less than 180 mg/dL (1-2 hours)      Critically ill patients:  140 - 180 mg/dL   Lab Results  Component Value Date   GLUCAP 167 (H) 10/13/2017   HGBA1C 6.6 (H) 06/18/2017    Review of Glycemic ControlResults for ALICE, VITELLI (MRN [id number]) as of 10/13/2017 11:31  Ref. Range 10/12/2017 20:00 10/12/2017 23:57 10/13/2017 03:51 10/13/2017 07:23 10/13/2017 11:18  Glucose-Capillary Latest Ref Range: 70 - 99 mg/dL 119 (H) 147 (H) 829 (H) 206 (H) 167 (H)   Home DM Meds: Levemir 40 units BID                              Current Orders: Novolog resistant q 4 hours, Levemir 10 units bid Vital High Protein Tube Feeds at 30cc/hr.  Inpatient Diabetes Program Recommendations:    May consider adding Novolog tube feed coverage 3 units q 4 hours to cover CHO in feeds.   Thanks  Beryl Meager, RN, BC-ADM Inpatient Diabetes Coordinator Pager [phone] (8a-5p)

## 2017-10-14 ENCOUNTER — Inpatient Hospital Stay: Payer: Medicare Other

## 2017-10-14 LAB — BASIC METABOLIC PANEL
Anion gap: 3 — ABNORMAL LOW (ref 5–15)
BUN: 40 mg/dL — AB (ref 8–23)
CALCIUM: 8.7 mg/dL — AB (ref 8.9–10.3)
CO2: 30 mmol/L (ref 22–32)
CREATININE: 0.86 mg/dL (ref 0.61–1.24)
Chloride: 113 mmol/L — ABNORMAL HIGH (ref 98–111)
Glucose, Bld: 161 mg/dL — ABNORMAL HIGH (ref 70–99)
Potassium: 4.7 mmol/L (ref 3.5–5.1)
SODIUM: 146 mmol/L — AB (ref 135–145)

## 2017-10-14 LAB — GLUCOSE, CAPILLARY
GLUCOSE-CAPILLARY: 117 mg/dL — AB (ref 70–99)
GLUCOSE-CAPILLARY: 156 mg/dL — AB (ref 70–99)
Glucose-Capillary: 168 mg/dL — ABNORMAL HIGH (ref 70–99)
Glucose-Capillary: 138 mg/dL — ABNORMAL HIGH (ref 70–99)
Glucose-Capillary: 154 mg/dL — ABNORMAL HIGH (ref 70–99)
Glucose-Capillary: 127 mg/dL — ABNORMAL HIGH (ref 70–99)
Glucose-Capillary: 130 mg/dL — ABNORMAL HIGH (ref 70–99)

## 2017-10-14 LAB — VANCOMYCIN, TROUGH: VANCOMYCIN TR: 19 ug/mL (ref 15–20)

## 2017-10-14 MED ORDER — TRAZODONE HCL 50 MG PO TABS
25.0000 mg | ORAL_TABLET | Freq: Every evening | ORAL | Status: DC | PRN
  Administered 2017-10-14 – 2017-10-22 (×3): 25 mg
  Filled 2017-10-14 (×3): qty 1

## 2017-10-14 MED ORDER — VANCOMYCIN HCL 10 G IV SOLR
1500.0000 mg | Freq: Two times a day (BID) | INTRAVENOUS | Status: DC
  Administered 2017-10-15 – 2017-10-16 (×2): 1500 mg via INTRAVENOUS
  Filled 2017-10-14 (×3): qty 1500

## 2017-10-14 MED ORDER — FUROSEMIDE 10 MG/ML IJ SOLN
40.0000 mg | Freq: Once | INTRAMUSCULAR | Status: AC
  Administered 2017-10-14: 40 mg via INTRAVENOUS
  Filled 2017-10-14: qty 4

## 2017-10-14 NOTE — Progress Notes (Signed)
SUBJECTIVE: Patient remains intubated and unresponsive   Vitals:   10/14/17 0730 10/14/17 0733 10/14/17 0800 10/14/17 0900  BP: (!) 141/58  (!) 142/70 (!) 149/59  Pulse: 68  67 66  Resp: 16  14 16   Temp: 99.3 F (37.4 C)     TempSrc: Oral     SpO2: 96% 96% 96% 95%  Weight:      Height:        Intake/Output Summary (Last 24 hours) at 10/14/2017 0946 Last data filed at 10/14/2017 0800 Gross per 24 hour  Intake 2191.66 ml  Output 900 ml  Net 1291.66 ml    LABS: Basic Metabolic Panel: Recent Labs    10/12/17 1728  10/13/17 0904 10/14/17 0604  NA 144  --  145 146*  K 5.1   < > 4.9 4.7  CL 113*  --  110 113*  CO2 28  --  28 30  GLUCOSE 255*  --  194* 161*  BUN 47*  --  39* 40*  CREATININE 0.91  --  0.84 0.86  CALCIUM 8.5*  --  8.7* 8.7*  MG 2.7*  --   --   --    < > = values in this interval not displayed.   Liver Function Tests: Recent Labs    10/13/17 0904  AST 18  ALT 17  ALKPHOS 60  BILITOT 0.3  PROT 6.4*  ALBUMIN 2.4*   No results for input(s): LIPASE, AMYLASE in the last 72 hours. CBC: Recent Labs    10/13/17 0904  WBC 6.1  NEUTROABS 4.4  HGB 8.8*  HCT 28.0*  MCV 86.1  PLT 249   Cardiac Enzymes: No results for input(s): CKTOTAL, CKMB, CKMBINDEX, TROPONINI in the last 72 hours. BNP: Invalid input(s): POCBNP D-Dimer: No results for input(s): DDIMER in the last 72 hours. Hemoglobin A1C: No results for input(s): HGBA1C in the last 72 hours. Fasting Lipid Panel: No results for input(s): CHOL, HDL, LDLCALC, TRIG, CHOLHDL, LDLDIRECT in the last 72 hours. Thyroid Function Tests: No results for input(s): TSH, T4TOTAL, T3FREE, THYROIDAB in the last 72 hours.  Invalid input(s): FREET3 Anemia Panel: No results for input(s): VITAMINB12, FOLATE, FERRITIN, TIBC, IRON, RETICCTPCT in the last 72 hours.   PHYSICAL EXAM General: Well developed, well nourished, in no acute distress HEENT:  Normocephalic and atramatic Neck:  No JVD.  Lungs: Clear  bilaterally to auscultation and percussion. Heart: HRRR . Normal S1 and S2 without gallops or murmurs.  Abdomen: Bowel sounds are positive, abdomen soft and non-tender  Msk:  Back normal, normal gait. Normal strength and tone for age. Extremities: No clubbing, cyanosis or edema.   Neuro: Alert and oriented X 3. Psych:  Good affect, responds appropriately  TELEMETRY: Sinus rhythm  ASSESSMENT AND PLAN: Status post cardiac arrest with pneumonia and respiratory failure patient remains intubated and unresponsive.  But hemodynamically appears to be stable with rhythm showing sinus rhythm.  Active Problems:   Cardiac arrest (HCC)    Laurier Nancy, MD, [facility] 10/14/2017 9:46 AM

## 2017-10-14 NOTE — Progress Notes (Signed)
Follow up - Critical Care Medicine Note  Patient Details:    Billy Liu is an 84 y.o. male.  With past medical history remarkable for hypertension, hyperlipidemia, gastroesophageal reflux disease, depression, Alzheimer's, coronary artery disease, COPD, stage III renal failure, chronic diastolic heart failure,  admitted with acute on chronic renal failure,  status post cardiac arrest, mechanically intubated, hypothermic protocol initiated @36  degrees C.   Lines, Airways, Drains: Airway 7.5 mm (Active)  Secured at (cm) 24 cm 10/10/2017  8:15 AM  Measured From Lips 10/10/2017  8:15 AM  Secured Location Left 10/10/2017  8:15 AM  Secured By [employer] 10/10/2017  8:15 AM  Tube Holder Repositioned Yes 10/10/2017  8:15 AM  Cuff Pressure (cm H2O) 28 cm H2O 10/10/2017  8:15 AM  Site Condition Dry 10/10/2017  8:15 AM     NG/OG Tube Orogastric Left mouth Xray Documented cm marking at nare/ corner of mouth 76 cm (Active)  Cm Marking at Nare/Corner of Mouth (if applicable) 70 cm 10/10/2017  8:00 AM  Site Assessment Clean;Dry;Intact 10/10/2017  8:00 AM  Ongoing Placement Verification No change in cm markings or external length of tube from initial placement;No acute changes, not attributed to clinical condition;No change in respiratory status 10/10/2017  8:00 AM  Status Infusing tube feed 10/10/2017  8:00 AM  Intake (mL) 120 mL 10/08/2017  6:14 PM     Urethral Catheter Urology MD Coude 16 Fr. (Active)  Indication for Insertion or Continuance of Catheter Unstable critical patients (first 24-48 hours);Bladder outlet obstruction / other urologic reason 10/10/2017  8:00 AM  Site Assessment Intact;Swelling;Edema 10/08/2017  7:45 AM  Catheter Maintenance Bag below level of bladder;Catheter secured;Drainage bag/tubing not touching floor;Insertion date on drainage bag;No dependent loops;Seal intact 10/10/2017  8:00 AM  Collection Container Standard drainage bag 10/10/2017  8:00 AM  Securement Method Tape  10/10/2017  8:00 AM  Urinary Catheter Interventions Unclamped 10/10/2017  8:00 AM  Output (mL) 1100 mL 10/10/2017  6:01 AM    Anti-infectives:  Anti-infectives (From admission, onward)   Start     Dose/Rate Route Frequency Ordered Stop   10/13/17 2300  vancomycin (VANCOCIN) 1,250 mg in sodium chloride 0.9 % 250 mL IVPB     1,250 mg 166.7 mL/hr over 90 Minutes Intravenous Every 8 hours 10/13/17 1721     10/13/17 2100  vancomycin (VANCOCIN) 1,250 mg in sodium chloride 0.9 % 250 mL IVPB  Status:  Discontinued     1,250 mg 166.7 mL/hr over 90 Minutes Intravenous Every 8 hours 10/13/17 1333 10/13/17 1721   10/13/17 1430  vancomycin (VANCOCIN) 1,250 mg in sodium chloride 0.9 % 250 mL IVPB     1,250 mg 166.7 mL/hr over 90 Minutes Intravenous  Once 10/13/17 1333 10/13/17 1803   10/13/17 1430  meropenem (MERREM) 1 g in sodium chloride 0.9 % 100 mL IVPB     1 g 200 mL/hr over 30 Minutes Intravenous Every 8 hours 10/13/17 1333     10/10/17 2200  ceFEPIme (MAXIPIME) 2 g in sodium chloride 0.9 % 100 mL IVPB  Status:  Discontinued     2 g 200 mL/hr over 30 Minutes Intravenous Every 12 hours 10/10/17 1557 10/12/17 0845   10/07/17 0600  ceFEPIme (MAXIPIME) 2 g in sodium chloride 0.9 % 100 mL IVPB  Status:  Discontinued     2 g 200 mL/hr over 30 Minutes Intravenous Every 24 hours 10/07/17 0050 10/07/17 0210   10/07/17 0600  vancomycin (VANCOCIN) 1,500 mg in sodium chloride  0.9 % 500 mL IVPB  Status:  Discontinued     1,500 mg 250 mL/hr over 120 Minutes Intravenous Every 24 hours 10/07/17 0050 10/07/17 1136   10/07/17 0600  ceFEPIme (MAXIPIME) 2 g in sodium chloride 0.9 % 100 mL IVPB  Status:  Discontinued     2 g 200 mL/hr over 30 Minutes Intravenous Every 12 hours 10/07/17 0210 10/09/17 1124   10/06/17 2130  ceFEPIme (MAXIPIME) 1 g in sodium chloride 0.9 % 100 mL IVPB     1 g 200 mL/hr over 30 Minutes Intravenous  Once 10/06/17 2126 10/07/17 0016   10/06/17 2130  vancomycin (VANCOCIN) IVPB 1000  mg/200 mL premix     1,000 mg 200 mL/hr over 60 Minutes Intravenous  Once 10/06/17 2126 10/07/17 0115      Microbiology: Results for orders placed or performed during the hospital encounter of 10/06/17  Blood culture (routine x 2)     Status: None   Collection Time: 10/06/17 10:43 PM  Result Value Ref Range Status   Specimen Description BLOOD LEFT HAND   Final   Special Requests   Final    BOTTLES DRAWN AEROBIC AND ANAEROBIC Blood Culture adequate volume   Culture   Final    NO GROWTH 5 DAYS Performed at [facility], [address] [place], [place] [address]    Report Status 10/11/2017 FINAL  Final  Blood culture (routine x 2)     Status: None   Collection Time: 10/06/17 10:44 PM  Result Value Ref Range Status   Specimen Description BLOOD BLOOD LEFT FOREARM  Final   Special Requests   Final    BOTTLES DRAWN AEROBIC AND ANAEROBIC Blood Culture adequate volume   Culture   Final    NO GROWTH 5 DAYS Performed at [facility], [address] [place], [place] [address]    Report Status 10/11/2017 FINAL  Final  MRSA PCR Screening     Status: None   Collection Time: 10/07/17  1:07 AM  Result Value Ref Range Status   MRSA by PCR NEGATIVE NEGATIVE Final    Comment:        The GeneXpert MRSA Assay (FDA approved for NASAL specimens only), is one component of a comprehensive MRSA colonization surveillance program. It is not intended to diagnose MRSA infection nor to guide or monitor treatment for MRSA infections. Performed at [facility], [address] [place], [place] [address]   Urine Culture     Status: None   Collection Time: 10/07/17  2:24 AM  Result Value Ref Range Status   Specimen Description   Final    URINE, RANDOM Performed at [facility], [address], [place], [place] [address]    Special Requests   Final    NONE Performed at [facility], [address] [place] [address] [place], [place] [address]    Culture   Final     NO GROWTH Performed at [facility], [address], [place], [place] [address]    Report Status 10/08/2017 FINAL  Final  Aerobic/Anaerobic Culture (surgical/deep wound)     Status: None   Collection Time: 10/07/17  2:24 AM  Result Value Ref Range Status   Specimen Description TOE LEFT  Final   Special Requests WOUND  Final   Gram Stain   Final    RARE SQUAMOUS EPITHELIAL CELLS PRESENT NO WBC SEEN MODERATE GRAM POSITIVE RODS FEW GRAM POSITIVE COCCI RARE GRAM NEGATIVE RODS  Culture   Final    FEW METHICILLIN RESISTANT STAPHYLOCOCCUS AUREUS WITHIN MIXED ORGANISMS NO ANAEROBES ISOLATED Performed at [facility], [address] [place], [place] [address]    Report Status 10/12/2017 FINAL  Final   Organism ID, Bacteria METHICILLIN RESISTANT STAPHYLOCOCCUS AUREUS  Final      Susceptibility   Methicillin resistant staphylococcus aureus - MIC*    CIPROFLOXACIN <=0.5 SENSITIVE Sensitive     ERYTHROMYCIN >=8 RESISTANT Resistant     GENTAMICIN <=0.5 SENSITIVE Sensitive     OXACILLIN >=4 RESISTANT Resistant     TETRACYCLINE <=1 SENSITIVE Sensitive     VANCOMYCIN 1 SENSITIVE Sensitive     TRIMETH/SULFA <=10 SENSITIVE Sensitive     CLINDAMYCIN <=0.25 SENSITIVE Sensitive     RIFAMPIN <=0.5 SENSITIVE Sensitive     Inducible Clindamycin NEGATIVE Sensitive     * FEW METHICILLIN RESISTANT STAPHYLOCOCCUS AUREUS  Aerobic/Anaerobic Culture (surgical/deep wound)     Status: Abnormal   Collection Time: 10/07/17  2:24 AM  Result Value Ref Range Status   Specimen Description   Final    LEG RIGHT Performed at [facility], [address], [place], [place] [address]    Special Requests   Final    NONE Performed at [facility], [address] [place], [place] [address]    Gram Stain   Final    NO WBC SEEN MODERATE GRAM NEGATIVE RODS FEW GRAM POSITIVE RODS RARE GRAM POSITIVE COCCI Performed at [facility], [address] [place], [place] [address]     Culture (A)  Final    MULTIPLE ORGANISMS PRESENT, NONE PREDOMINANT MIXED ANAEROBIC FLORA PRESENT.  CALL LAB IF FURTHER IID REQUIRED.    Report Status 10/12/2017 FINAL  Final  Culture, respiratory (non-expectorated)     Status: None (Preliminary result)   Collection Time: 10/13/17 12:50 PM  Result Value Ref Range Status   Specimen Description   Final    TRACHEAL ASPIRATE Performed at [facility], [address], [place], [place] [address]    Special Requests   Final    NONE Performed at [facility], [address] [place], [place] [address]    Gram Stain   Final    FEW WBC PRESENT, PREDOMINANTLY PMN MODERATE GRAM POSITIVE RODS FEW GRAM VARIABLE ROD FEW GRAM NEGATIVE RODS RARE GRAM POSITIVE COCCI Performed at [facility], [address], [place], [place] [address]    Culture PENDING  Incomplete   Report Status PENDING  Incomplete   Studies: Dg Chest 1 View  Result Date: 10/07/2017 CLINICAL DATA:  Feeding tube, evaluate placement of endotracheal tube EXAM: CHEST  1 VIEW COMPARISON:  Portable chest x-ray of 10/06/2016 FINDINGS: The tip of the endotracheal tube is approximately 3.9 cm above the carina. The lungs remain poorly aerated and cardiomegaly is stable. Haziness at both lung bases may reflect atelectasis and effusion although pneumonia cannot be excluded. NG tube extends below the hemidiaphragm. IMPRESSION: 1. Endotracheal tube tip 3.9 cm above the carina. 2. Poor aeration with basilar volume loss, probable effusions, and pneumonia cannot be excluded. 3. Stable cardiomegaly. 4. NG tube extends below the hemidiaphragm. Electronically Signed   By: Dwyane Dee M.D.   On: 10/07/2017 14:03   Dg Abd 1 View  Result Date: 10/07/2017 CLINICAL DATA:  Evaluate position of feeding tube EXAM: ABDOMEN - 1 VIEW COMPARISON:  Abdomen 10/07/2016 FINDINGS: A tip of the feeding tube is seen within the fundus dex  proximal body of the stomach. Bowel gas pattern is  nonspecific. Some volume loss is again noted at the lung bases and cardiomegaly is stable IMPRESSION: Feeding tube tip extends to the region of the fundus-proximal stomach. The bowel gas pattern is nonspecific Electronically Signed   By: Dwyane Dee M.D.   On: 10/07/2017 14:04   Dg Abd 1 View  Result Date: 10/07/2017 CLINICAL DATA:  84 year old male with nasogastric tube placement. Subsequent encounter. EXAM: ABDOMEN - 1 VIEW COMPARISON:  10/07/2017 2:36 a.m. FINDINGS: Nasogastric tube radiopaque marker can only be well delineated to the level of the distal esophagus. Tip of the nasogastric tube may enter the stomach but is not adequately assessed secondary to overlying leads in the left upper quadrant. Removal of leads or change of lead position and repeat exam may prove helpful for further delineation. Nonspecific bowel gas pattern. IMPRESSION: Nasogastric tube radiopaque marker can only be well delineated to the level of the distal esophagus. Tip of the nasogastric tube may enter the stomach but is not adequately assessed secondary to overlying leads in the left upper quadrant. Removal of leads or change of lead position and repeat exam may prove helpful for further delineation. These results will be called to the ordering clinician or representative by the Radiologist Assistant, and communication documented in the PACS or zVision Dashboard. Electronically Signed   By: Lacy Duverney M.D.   On: 10/07/2017 08:32   Dg Abd 1 View  Result Date: 10/07/2017 CLINICAL DATA:  OG tube placement, second attempt EXAM: ABDOMEN - 1 VIEW COMPARISON:  10/07/2017 at 0228 hours FINDINGS: Enteric tube terminates in the gastric cardia with its side port at/just above the GE junction. IMPRESSION: Enteric tube terminates in the gastric cardia with its side port at/just above the GE junction. Electronically Signed   By: Charline Bills M.D.   On: 10/07/2017 02:48   Dg Abd 1 View  Result Date: 10/07/2017 CLINICAL DATA:  OG  tube placement EXAM: ABDOMEN - 1 VIEW COMPARISON:  10/06/2017 FINDINGS: Enteric tube terminates at the GE junction with the side port in the distal esophagus. IMPRESSION: Enteric tube terminates at the GE junction with the side port in the distal esophagus. Electronically Signed   By: Charline Bills M.D.   On: 10/07/2017 02:47   Dg Abd 1 View  Result Date: 10/06/2017 CLINICAL DATA:  OG tube EXAM: ABDOMEN - 1 VIEW COMPARISON:  None. FINDINGS: By history patient has an OG tube. Just to the RIGHT of the thoracic spine, there is a radiopaque line, possibly an orogastric tube. However this is slightly to the RIGHT of the expected course of the esophagus and does not extend into the LEFT UPPER QUADRANT. It is possible that orogastric tube has been withdrawn or removed and not included on the film. There is dense opacification of the LEFT lung base. Visualized bowel gas pattern is nonobstructive. IMPRESSION: Possible orogastric tube to the distal esophagus, but this appears slightly to the RIGHT of the expected course of the esophagus. Recommend confirmation of orogastric tube placement and consider advancing tube further into the stomach if this is felt to be the orogastric tube. Significant LEFT LOWER lobe opacity. Electronically Signed   By: Norva Pavlov M.D.   On: 10/06/2017 21:00   Ct Head Wo Contrast  Result Date: 10/09/2017 CLINICAL DATA:  Question anoxic brain injury.  Cardiac arrest EXAM: CT HEAD WITHOUT CONTRAST TECHNIQUE: Contiguous axial images were obtained from the base of the skull through the vertex without  intravenous contrast. 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 Small remote lateral left frontal infarct best seen on reformats. There is generalized atrophy. Small remote left cerebellar infarct. No detected acute infarct or swelling. No hemorrhage, hydrocephalus, or collection. Vascular: Atherosclerotic calcification.  No hyperdense  vessel. Skull: Remote left frontal craniotomy.  No acute finding Sinuses/Orbits: No acute finding IMPRESSION: 1. No evident anoxic injury. 2. 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 Electronically Signed   By: Marnee Spring M.D.   On: 10/09/2017 14:46   Ct Head Wo Contrast  Result Date: 10/06/2017 CLINICAL DATA:  Altered mental status (AMS), unclear cause; C-spine trauma, high clinical risk (NEXUS/CCR). Collapsed in the bathroom. EXAM: CT HEAD WITHOUT CONTRAST CT CERVICAL SPINE WITHOUT CONTRAST TECHNIQUE: Multidetector CT imaging of the head and cervical spine was performed following the standard protocol without intravenous contrast. Multiplanar CT image reconstructions of the cervical spine were also generated. COMPARISON:  Head CT 06/17/2017 FINDINGS: CT HEAD FINDINGS Brain: Unchanged degree of atrophy and chronic small vessel ischemia. Encephalomalacia in the left parietal lobe again seen. No intracranial hemorrhage, mass effect, or midline shift. No hydrocephalus. The basilar cisterns are patent. No evidence of territorial infarct or acute ischemia. No extra-axial or intracranial fluid collection. Vascular: Atherosclerosis of skullbase vasculature without hyperdense vessel or abnormal calcification. Skull: Prior left frontoparietal craniotomy.  No skull fracture. Sinuses/Orbits: Paranasal sinuses and mastoid air cells are clear. The visualized orbits are unremarkable. Bilateral cataract resection. Other: None. CT CERVICAL SPINE FINDINGS Alignment: Straightening of normal cervical lordosis. Skull base and vertebrae: No acute fracture. Bulky anterior osteophytes from C3-C4 through C6-C7, well corticated lucency through the osteophytes at C4 and C5 may represent remote prior trauma. The dens and skull base are intact. Soft tissues and spinal canal: Debris in the hypopharynx limits assessment for prevertebral edema, no gross prevertebral soft tissue thickening. No evidence of canal hematoma. Disc  levels: Near complete disc space loss at C2-C3 with bony ankylosis of the posterior elements on the left. Disc space narrowing at C5-C6 and C6-C7. 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 Bulky anterior osteophytes throughout, some of which are fragmented. Multilevel facet arthropathy. Upper chest: Moderate right pleural effusion, partially included. Other: Soft tissue prominence involving the right aspect of the hypopharynx is incompletely included in the field of view, causing mild leftward endotracheal and enteric tube deviation. There are dense vascular calcifications. IMPRESSION: 1.  No acute intracranial abnormality.  No skull fracture. 2. Unchanged atrophy, chronic small vessel ischemia, and left parietal encephalomalacia. 3. Advanced multilevel degenerative change in the cervical spine without evidence of acute fracture. 4. Soft tissue prominence involving the right hypopharynx is only partially included in the field of view, this may represent retained secretions or asymmetric positioning of the patient's tongue, however underlying mass lesion is not excluded. Consider contrast enhanced CT of the neck as clinically indicated. 5. Moderate right pleural effusion partially included in the field of view. Electronically Signed   By: Narda Rutherford M.D.   On: 10/06/2017 21:38   Ct Cervical Spine Wo Contrast  Result Date: 10/06/2017 CLINICAL DATA:  Altered mental status (AMS), unclear cause; C-spine trauma, high clinical risk (NEXUS/CCR). Collapsed in the bathroom. EXAM: CT HEAD WITHOUT CONTRAST CT CERVICAL SPINE WITHOUT CONTRAST TECHNIQUE: Multidetector CT imaging of the head and cervical spine was performed following the standard protocol without intravenous contrast. Multiplanar CT image reconstructions of the cervical spine were also generated. COMPARISON:  Head CT 06/17/2017  FINDINGS: CT HEAD FINDINGS Brain: Unchanged degree of atrophy and chronic small vessel ischemia.  Encephalomalacia in the left parietal lobe again seen. No intracranial hemorrhage, mass effect, or midline shift. No hydrocephalus. The basilar cisterns are patent. No evidence of territorial infarct or acute ischemia. No extra-axial or intracranial fluid collection. Vascular: Atherosclerosis of skullbase vasculature without hyperdense vessel or abnormal calcification. Skull: Prior left frontoparietal craniotomy.  No skull fracture. Sinuses/Orbits: Paranasal sinuses and mastoid air cells are clear. The visualized orbits are unremarkable. Bilateral cataract resection. Other: None. CT CERVICAL SPINE FINDINGS Alignment: Straightening of normal cervical lordosis. Skull base and vertebrae: No acute fracture. Bulky anterior osteophytes from C3-C4 through C6-C7, well corticated lucency through the osteophytes at C4 and C5 may represent remote prior trauma. The dens and skull base are intact. Soft tissues and spinal canal: Debris in the hypopharynx limits assessment for prevertebral edema, no gross prevertebral soft tissue thickening. No evidence of canal hematoma. Disc levels: Near complete disc space loss at C2-C3 with bony ankylosis of the posterior elements on the left. Disc space narrowing at C5-C6 and C6-C7. 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 Bulky anterior osteophytes throughout, some of which are fragmented. Multilevel facet arthropathy. Upper chest: Moderate right pleural effusion, partially included. Other: Soft tissue prominence involving the right aspect of the hypopharynx is incompletely included in the field of view, causing mild leftward endotracheal and enteric tube deviation. There are dense vascular calcifications. IMPRESSION: 1.  No acute intracranial abnormality.  No skull fracture. 2. Unchanged atrophy, chronic small vessel ischemia, and left parietal encephalomalacia. 3. Advanced multilevel degenerative change in the cervical spine without evidence of acute fracture. 4. Soft  tissue prominence involving the right hypopharynx is only partially included in the field of view, this may represent retained secretions or asymmetric positioning of the patient's tongue, however underlying mass lesion is not excluded. Consider contrast enhanced CT of the neck as clinically indicated. 5. Moderate right pleural effusion partially included in the field of view. Electronically Signed   By: Narda Rutherford M.D.   On: 10/06/2017 21:38   Mr Brain Wo Contrast  Result Date: 10/10/2017 CLINICAL DATA:  Altered mental status. EXAM: MRI HEAD WITHOUT CONTRAST TECHNIQUE: Multiplanar, multiecho pulse sequences of the brain and surrounding structures were obtained without intravenous contrast. COMPARISON:  Head CT 10/09/2017 FINDINGS: The study is mildly motion degraded. Brain: There is no evidence of acute infarct, intracranial hemorrhage, mass, midline shift, or extra-axial fluid collection. Small chronic infarcts are again noted in the left parietal lobe and left cerebellum. There is also minimal cortical encephalomalacia laterally in the left frontal lobe subjacent to the craniotomy. Bilateral periventricular white matter T2 hyperintensities are nonspecific but compatible with mild chronic small vessel ischemic disease. There is mild-to-moderate cerebral atrophy. Vascular: Major intracranial vascular flow voids are preserved. Skull and upper cervical spine: Left frontoparietal craniotomy. Upper cervical disc degeneration. Sinuses/Orbits: Bilateral cataract extraction. Mild bilateral maxillary and sphenoid sinus mucosal thickening. Large bilateral mastoid effusions and fluid in the pharynx in the setting of intubation. Other: None. IMPRESSION: 1. No acute intracranial abnormality. 2. Chronic left parietal, left frontal, and left cerebellar infarcts. 3. Mild chronic small vessel ischemia in the cerebral white matter. Electronically Signed   By: Sebastian Ache M.D.   On: 10/10/2017 16:52   Dg Pelvis  Portable  Result Date: 10/10/2017 CLINICAL DATA:  MRI screening for metal EXAM: PORTABLE PELVIS 1-2 VIEWS COMPARISON:  10/07/2017, 11/07/2009 radiographs FINDINGS: No metallic density radiopaque foreign body is seen  within the included portions of the bony pelvis and overlying soft tissues. SI joints are non widened. There is a catheter or probe inferior to the pubic symphysis. No fracture or malalignment although right femoral neck is obscured by the trochanter. Phleboliths in the right pelvis. There may be chronic avulsion injury at the left ischium. IMPRESSION: Negative. Electronically Signed   By: Jasmine Pang M.D.   On: 10/10/2017 15:27   Dg Chest Port 1 View  Result Date: 10/13/2017 CLINICAL DATA:  Endotracheal and orogastric tubes. EXAM: PORTABLE CHEST 1 VIEW COMPARISON:  Earlier today. FINDINGS: The endotracheal tube tip is unchanged, 3.9 cm above the carina. Orogastric tube extending into the stomach. Stable enlarged cardiac silhouette. Mild decrease in prominence of the pulmonary vasculature and interstitial markings. Poorly defined probable small bilateral pleural effusions. Interval linear density at the left lung base and poorly defined increased density with volume loss at the right lung base. IMPRESSION: 1. Mildly improved changes of congestive heart failure. 2. Interval bibasilar atelectasis. Electronically Signed   By: Beckie Salts M.D.   On: 10/13/2017 13:33   Dg Chest Port 1 View  Result Date: 10/13/2017 CLINICAL DATA:  On mechanically assisted ventilation, CHF, COPD EXAM: PORTABLE CHEST 1 VIEW COMPARISON:  Portable chest x-ray of 10/11/2017 and 10/09/2017 FINDINGS: The tip of the endotracheal tube is approximately 3.8 cm above the carina. Bilateral pleural effusions again are noted with some airspace disease most consistent with congestive heart failure and cardiomegaly is noted. NG tube extends below the hemidiaphragm. There are diffuse degenerative changes throughout the thoracic  spine. IMPRESSION: 1. Little change in probable CHF with effusions, cardiomegaly and pulmonary edema. 2. Tip of endotracheal tube 3.8 cm above the carina. Electronically Signed   By: Dwyane Dee M.D.   On: 10/13/2017 08:52   Dg Chest Port 1 View  Result Date: 10/11/2017 CLINICAL DATA:  Ventilator EXAM: PORTABLE CHEST 1 VIEW COMPARISON:  10/09/2017 FINDINGS: Endotracheal tube is 9 cm above the carina. Cardiomegaly with vascular congestion. Bilateral perihilar lower lobe opacities and layering effusions are similar prior study. NG tube is in the stomach. IMPRESSION: Bilateral layering effusions with cardiomegaly and bilateral perihilar and lower lobe airspace opacities, likely edema and/or atelectasis. Electronically Signed   By: Charlett Nose M.D.   On: 10/11/2017 08:26   Dg Chest Port 1 View  Result Date: 10/09/2017 CLINICAL DATA:  Acute respiratory failure. EXAM: PORTABLE CHEST 1 VIEW COMPARISON:  Radiograph 10/07/2017 FINDINGS: Endotracheal tube 4.4 cm from the carina. Enteric tube in place with tip and side-port below the diaphragm. Cardiomegaly is unchanged. Worsening hazy opacities throughout both lungs. Bibasilar volume loss again seen with associated effusions. No pneumothorax. IMPRESSION: Worsening lung aeration with increasing hazy opacities throughout both lungs, likely combination of pleural fluid, atelectasis, and pulmonary edema. Electronically Signed   By: Narda Rutherford M.D.   On: 10/09/2017 04:06   Dg Chest Portable 1 View  Result Date: 10/06/2017 CLINICAL DATA:  Ett tube EXAM: PORTABLE CHEST 1 VIEW COMPARISON:  06/20/2017 FINDINGS: Interval placement of endotracheal tube, tip approximately 3.0 centimeters above the carina. An orogastric tube is in place, tip overlying the level of the LOWER esophagus but difficult to confirm. The heart is enlarged. There is dense opacity in the LEFT lung base. Perihilar opacities are consistent with pulmonary edema. IMPRESSION: Interval placement of  endotracheal tube and orogastric tube. LEFT LOWER lobe opacity and pulmonary edema. Electronically Signed   By: Norva Pavlov M.D.   On: 10/06/2017 21:02  Dg Abd Portable 1v  Result Date: 10/13/2017 CLINICAL DATA:  Orogastric tube placement. EXAM: PORTABLE ABDOMEN - 1 VIEW COMPARISON:  Portable chest obtained at the same time. FINDINGS: Orogastric tube tip in the mid to distal stomach and side hole in the proximal to mid stomach. The included portion of the bowel gas pattern is normal. See the portable chest report for the chest findings. Lumbar and thoracic spine degenerative changes. IMPRESSION: Orogastric tube tip and side hole in the stomach. Electronically Signed   By: Beckie Salts M.D.   On: 10/13/2017 13:34   Dg Abd Portable 1v  Result Date: 10/07/2017 CLINICAL DATA:  Orogastric tube placement. EXAM: PORTABLE ABDOMEN - 1 VIEW COMPARISON:  Radiograph of same day. FINDINGS: The bowel gas pattern is normal. Distal tip of nasogastric tube is seen in proximal stomach, with side hole at expected position of gastroesophageal junction. No radio-opaque calculi or other significant radiographic abnormality are seen. IMPRESSION: Distal tip of nasogastric tube seen in proximal stomach, with side hole at expected position of gastroesophageal junction. Advancement is recommended. Electronically Signed   By: Lupita Raider, M.D.   On: 10/07/2017 12:18    Consults: Treatment Team:  Pccm, Raymond Gurney, MD Laurier Nancy, MD Kym Groom, MD   Subjective:    Overnight Issues: Patient yesterday had a self extubation, required repeat intubation, mucopurulence noted at vocal cords, bronchoscopy performed revealed diffuse pneumonia, started on vancomycin and meropenem.  Objective:  Vital signs for last 24 hours: Temp:  [94 F (34.4 C)-99.3 F (37.4 C)] 99.3 F (37.4 C) (10/08 0730) Pulse Rate:  [51-69] 68 (10/08 0730) Resp:  [13-21] 16 (10/08 0730) BP: (132-189)/(46-69) 141/58 (10/08  0730) SpO2:  [88 %-98 %] 96 % (10/08 0733) FiO2 (%):  [30 %] 30 % (10/08 0733) Weight:  [124.9 kg] 124.9 kg (10/08 0350)  Hemodynamic parameters for last 24 hours:    Intake/Output from previous day: 10/07 0701 - 10/08 0700 In: 2309.2 [I.V.:1444.7; IV Piggyback:864.5] Out: 500 [Urine:500]  Intake/Output this shift: No intake/output data recorded.  Vent settings for last 24 hours: Vent Mode: PRVC FiO2 (%):  [30 %] 30 % Set Rate:  [16 bmp] 16 bmp Vt Set:  [500 mL] 500 mL PEEP:  [5 cmH20] 5 cmH20 Pressure Support:  [5 cmH20] 5 cmH20 Plateau Pressure:  [21 cmH20-29 cmH20] 21 cmH20  Physical Exam:  Vital signs: Please see the above listed vital signs HEENT: Trachea midline, orally intubated, no accessory muscle utilization Cardiovascular: Regular rate and rhythm Abdominal: Positive bowel sounds, soft exam Extremities: No clubbing cyanosis, 1+ edema appreciated Neurologic: Patient is sedated, will track with eyes but no purposeful response  Assessment/Plan:   Status post cardiac arrest.  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  Being followed by neurology.  Post EEG and MRI, please see reports, patient is off sedation, appreciate neurology input  Hypoxemic/hypercapnic respiratory failure.  Patient had a failed self extubation yesterday required repeat intubation and bronchoscopy for mucopurulent pneumonia, started on vancomycin and meropenem pending a.m. chest x-ray.  Patient now has had 2 unsuccessful extubations, will most likely require tracheostomy.  Will discuss with family  Renal failure.  Prerenal indices, BUN 40/creatinine 0.86  Anemia.  No evidence of active bleeding hemoglobin 8.8/hematocrit 28  Wound culture positive for staph with some methicillin-resistant species  Critical Care Total Time 40 minutes  Kayveon Lennartz 10/14/2017  *Care during the described time interval was provided by me  and/or  other providers on the critical care team.  I have reviewed this patient's available data, including medical history, events of note, physical examination and test results as part of my evaluation. Patient ID: Jaxxon Naeem, male   DOB: Oct 07, 1932, 84 y.o.   MRN: [id number] Patient ID: Ezell Poke, male   DOB: 03-06-1932, 84 y.o.   MRN: [id number] Patient ID: Jasiel Belisle, male   DOB: [birth year], 84 y.o.   MRN: [id number] Patient ID: Vladislav Axelson, male   DOB: 1932-09-11, 84 y.o.   MRN: [id number]

## 2017-10-14 NOTE — Progress Notes (Signed)
Patient ID: Billy Liu, male   DOB: 1932/02/28, 84 y.o.   MRN: [id number]  [employer] PROGRESS NOTE  Delbert Darley TUU:[id number] DOB: Jul 07, 1932 DOA: 10/06/2017 PCP: Alvester Morin, MD  HPI/Subjective: Patient remains intubated Patient self extubated yesterday  Objective: Vitals:   10/14/17 1158 10/14/17 1234  BP:    Pulse:    Resp:    Temp:  99 F (37.2 C)  SpO2: 95%     Filed Weights   10/12/17 0325 10/13/17 0456 10/14/17 0350  Weight: 125.9 kg 124.7 kg 124.9 kg    ROS: Review of Systems  Unable to perform ROS: Acuity of condition   Exam: Physical Exam  Constitutional: He is intubated.  HENT:  Nose: Mucosal edema present.  Unable to look into mouth  Eyes: Conjunctivae and lids are normal.  Pupils pinpoint  Neck: Carotid bruit is not present.  Cardiovascular: Regular rhythm, S1 normal, S2 normal and normal heart sounds.  Respiratory: He is intubated. He has decreased breath sounds in the right lower field and the left lower field. He has no wheezes. He has rhonchi in the right lower field and the left lower field.  GI: Bowel sounds are normal. There is no tenderness.  Musculoskeletal:       Right ankle: He exhibits swelling.       Left ankle: He exhibits swelling.  Neurological:  Opens eyes and moves arms with stimulation  Skin: Skin is warm.  Right posterior calf large area of greenish discoloration and foul-smelling in an ulcer.  Left foot first toe with small ulceration and very swollen first toe.  Likely from chronic infection  Psychiatric:  Opens eyes and moves his arms to stimulation      Data Reviewed: Basic Metabolic Panel: Recent Labs  Lab 10/08/17 0423  10/10/17 0340 10/11/17 0516 10/12/17 0625 10/12/17 1728 10/13/17 0452 10/13/17 0904 10/14/17 0604  NA 135   < > 141 144  --  144  --  145 146*  K 4.6   < > 5.0 5.0 5.2* 5.1 5.1 4.9 4.7  CL 103   < > 110 111  --  113*  --  110 113*  CO2 26   < > 26 28  --  28  --  28  30  GLUCOSE 122*   < > 255* 253*  --  255*  --  194* 161*  BUN 51*   < > 49* 45*  --  47*  --  39* 40*  CREATININE 1.49*   < > 1.19 0.95  --  0.91  --  0.84 0.86  CALCIUM 7.8*   < > 8.0* 8.4*  --  8.5*  --  8.7* 8.7*  MG 2.7*  --   --   --   --  2.7*  --   --   --   PHOS 4.4  --   --   --   --   --   --   --   --    < > = values in this interval not displayed.   Liver Function Tests: Recent Labs  Lab 10/13/17 0904  AST 18  ALT 17  ALKPHOS 60  BILITOT 0.3  PROT 6.4*  ALBUMIN 2.4*   CBC: Recent Labs  Lab 10/08/17 0423 10/08/17 1120 10/09/17 0331 10/10/17 0340 10/11/17 0516 10/13/17 0904  WBC 6.7  --  6.5 6.7 9.5 6.1  NEUTROABS  --   --   --   --  7.6* 4.4  HGB 6.9* 8.3* 8.1* 8.5* 8.5* 8.8*  HCT 20.7* 24.9* 24.1* 26.0* 27.8* 28.0*  MCV 83.7  --  84.9 86.1 85.8 86.1  PLT 238  --  231 244 267 249   Cardiac Enzymes: Recent Labs  Lab 10/07/17 2016  TROPONINI <0.03    CBG: Recent Labs  Lab 10/13/17 1952 10/14/17 0004 10/14/17 0354 10/14/17 0750 10/14/17 1121  GLUCAP 124* 156* 168* 138* 154*    Recent Results (from the past 240 hour(s))  Blood culture (routine x 2)     Status: None   Collection Time: 10/06/17 10:43 PM  Result Value Ref Range Status   Specimen Description BLOOD LEFT HAND   Final   Special Requests   Final    BOTTLES DRAWN AEROBIC AND ANAEROBIC Blood Culture adequate volume   Culture   Final    NO GROWTH 5 DAYS Performed at [facility], [address] [place], [place] [address]    Report Status 10/11/2017 FINAL  Final  Blood culture (routine x 2)     Status: None   Collection Time: 10/06/17 10:44 PM  Result Value Ref Range Status   Specimen Description BLOOD BLOOD LEFT FOREARM  Final   Special Requests   Final    BOTTLES DRAWN AEROBIC AND ANAEROBIC Blood Culture adequate volume   Culture   Final    NO GROWTH 5 DAYS Performed at [facility], [address] [place], [place] [address]    Report Status 10/11/2017  FINAL  Final  MRSA PCR Screening     Status: None   Collection Time: 10/07/17  1:07 AM  Result Value Ref Range Status   MRSA by PCR NEGATIVE NEGATIVE Final    Comment:        The GeneXpert MRSA Assay (FDA approved for NASAL specimens only), is one component of a comprehensive MRSA colonization surveillance program. It is not intended to diagnose MRSA infection nor to guide or monitor treatment for MRSA infections. Performed at [facility], [address], [place], [place] [address]   Urine Culture     Status: None   Collection Time: 10/07/17  2:24 AM  Result Value Ref Range Status   Specimen Description   Final    URINE, RANDOM Performed at [facility], [address], [place], [place] [address]    Special Requests   Final    NONE Performed at [facility], [address] [place], [place] [address]    Culture   Final    NO GROWTH Performed at [facility], [place] [address] [place], [place] [address]    Report Status 10/08/2017 FINAL  Final  Aerobic/Anaerobic Culture (surgical/deep wound)     Status: None   Collection Time: 10/07/17  2:24 AM  Result Value Ref Range Status   Specimen Description TOE LEFT  Final   Special Requests WOUND  Final   Gram Stain   Final    RARE SQUAMOUS EPITHELIAL CELLS PRESENT NO WBC SEEN MODERATE GRAM POSITIVE RODS FEW GRAM POSITIVE COCCI RARE GRAM NEGATIVE RODS    Culture   Final    FEW METHICILLIN RESISTANT STAPHYLOCOCCUS AUREUS WITHIN MIXED ORGANISMS NO ANAEROBES ISOLATED Performed at [facility], [place] [address] [place], [place] [address]    Report Status 10/12/2017 FINAL  Final   Organism ID, Bacteria METHICILLIN RESISTANT STAPHYLOCOCCUS AUREUS  Final      Susceptibility   Methicillin resistant staphylococcus aureus - MIC*    CIPROFLOXACIN <=  0.5 SENSITIVE Sensitive     ERYTHROMYCIN >=8 RESISTANT Resistant     GENTAMICIN <=0.5 SENSITIVE Sensitive     OXACILLIN >=4 RESISTANT Resistant      TETRACYCLINE <=1 SENSITIVE Sensitive     VANCOMYCIN 1 SENSITIVE Sensitive     TRIMETH/SULFA <=10 SENSITIVE Sensitive     CLINDAMYCIN <=0.25 SENSITIVE Sensitive     RIFAMPIN <=0.5 SENSITIVE Sensitive     Inducible Clindamycin NEGATIVE Sensitive     * FEW METHICILLIN RESISTANT STAPHYLOCOCCUS AUREUS  Aerobic/Anaerobic Culture (surgical/deep wound)     Status: Abnormal   Collection Time: 10/07/17  2:24 AM  Result Value Ref Range Status   Specimen Description   Final    LEG RIGHT Performed at [facility], [place] [address] [place], [place] [address]    Special Requests   Final    NONE Performed at [facility], [place], [place] [address]    Gram Stain   Final    NO WBC SEEN MODERATE GRAM NEGATIVE RODS FEW GRAM POSITIVE RODS RARE GRAM POSITIVE COCCI Performed at [facility], [place] [address] [place], [place] [address]    Culture (A)  Final    MULTIPLE ORGANISMS PRESENT, NONE PREDOMINANT MIXED ANAEROBIC FLORA PRESENT.  CALL LAB IF FURTHER IID REQUIRED.    Report Status 10/12/2017 FINAL  Final  Culture, respiratory (non-expectorated)     Status: None (Preliminary result)   Collection Time: 10/13/17 12:50 PM  Result Value Ref Range Status   Specimen Description   Final    TRACHEAL ASPIRATE Performed at [facility], [address] [place], [place] [address]    Special Requests   Final    NONE Performed at [facility], [address] [place], [place] [address]    Gram Stain   Final    FEW WBC PRESENT, PREDOMINANTLY PMN MODERATE GRAM POSITIVE RODS FEW GRAM VARIABLE ROD FEW GRAM NEGATIVE RODS RARE GRAM POSITIVE COCCI    Culture   Final    TOO YOUNG TO READ Performed at [facility], [place] [address], [place], [place] [address]    Report Status PENDING  Incomplete     Studies: Dg Chest Port 1 View  Result Date: 10/13/2017 CLINICAL DATA:  Endotracheal and orogastric tubes. EXAM: PORTABLE CHEST 1 VIEW  COMPARISON:  Earlier today. FINDINGS: The endotracheal tube tip is unchanged, 3.9 cm above the carina. Orogastric tube extending into the stomach. Stable enlarged cardiac silhouette. Mild decrease in prominence of the pulmonary vasculature and interstitial markings. Poorly defined probable small bilateral pleural effusions. Interval linear density at the left lung base and poorly defined increased density with volume loss at the right lung base. IMPRESSION: 1. Mildly improved changes of congestive heart failure. 2. Interval bibasilar atelectasis. Electronically Signed   By: Claudie Revering M.D.   On: 10/13/2017 13:33   Dg Chest Port 1 View  Result Date: 10/13/2017 CLINICAL DATA:  On mechanically assisted ventilation, CHF, COPD EXAM: PORTABLE CHEST 1 VIEW COMPARISON:  Portable chest x-ray of 10/11/2017 and 10/09/2017 FINDINGS: The tip of the endotracheal tube is approximately 3.8 cm above the carina. Bilateral pleural effusions again are noted with some airspace disease most consistent with congestive heart failure and cardiomegaly is noted. NG tube extends below the hemidiaphragm. There are diffuse degenerative changes throughout the thoracic spine. IMPRESSION: 1. Little change in probable CHF with effusions, cardiomegaly and pulmonary edema. 2. Tip of endotracheal tube 3.8 cm above the carina. Electronically  Signed   By: Ivar Drape M.D.   On: 10/13/2017 08:52   Dg Abd Portable 1v  Result Date: 10/13/2017 CLINICAL DATA:  Orogastric tube placement. EXAM: PORTABLE ABDOMEN - 1 VIEW COMPARISON:  Portable chest obtained at the same time. FINDINGS: Orogastric tube tip in the mid to distal stomach and side hole in the proximal to mid stomach. The included portion of the bowel gas pattern is normal. See the portable chest report for the chest findings. Lumbar and thoracic spine degenerative changes. IMPRESSION: Orogastric tube tip and side hole in the stomach. Electronically Signed   By: Claudie Revering M.D.   On:  10/13/2017 13:34    Scheduled Meds: . ALPRAZolam  0.5 mg Oral TID  . chlorhexidine gluconate (MEDLINE KIT)  15 mL Mouth Rinse BID  . famotidine  20 mg Per Tube BID  . feeding supplement (PRO-STAT SUGAR FREE 64)  60 mL Per Tube QID  . feeding supplement (VITAL HIGH PROTEIN)  1,000 mL Per Tube Q24H  . heparin  5,000 Units Subcutaneous Q8H  . Influenza vac split quadrivalent PF  0.5 mL Intramuscular Tomorrow-1000  . insulin aspart  0-20 Units Subcutaneous Q4H  . insulin detemir  10 Units Subcutaneous BID  . mouth rinse  15 mL Mouth Rinse 10 times per day  . multivitamin  15 mL Per Tube Daily  . polyethylene glycol  17 g Per Tube Daily  . senna-docusate  2 tablet Per Tube BID   Continuous Infusions: . sodium chloride Stopped (10/12/17 2141)  . dexmedetomidine (PRECEDEX) IV infusion Stopped (10/14/17 1234)  . fentaNYL infusion INTRAVENOUS 400 mcg/hr (10/14/17 1528)  . meropenem (MERREM) IV 1 g (10/14/17 6256)  . propofol (DIPRIVAN) infusion 20 mcg/kg/min (10/14/17 1530)  . valproate sodium Stopped (10/14/17 0506)  . [START ON 10/15/2017] vancomycin      Assessment/Plan:  1. Cardiac arrest.  Patient underwent hypothermia protocol.  Continue supportive care.  MRI shows old stroke neurology following,  2. Acute hypoxic respiratory failure.  Continue ventilator support.  Continues to require 30% FiO2  3. left lower lobe pneumonia, right lower extremity ulceration with foul-smelling discharge, left first toe ulceration and swelling of the first toe likely chronic infection.  Antibiotics per the ICU team hypotension.  Off pressors 4. Acute kidney injury and hyperkalemia.   This has improved. 5. History of chronic diastolic congestive heart failure 6. Overall prognosis poor and high risk for cardiopulmonary arrest.  Patient listed as full code. 7. Anemia.  Responded to transfusion.    Code Status:     Code Status Orders  (From admission, onward)         Start     Ordered   10/07/17  0132  Full code  Continuous     10/07/17 0131        Code Status History    Date Active Date Inactive Code Status Order ID Comments User Context   10/07/2017 0031 10/07/2017 0131 Full Code [id number]  Awilda Bill, NP ED   06/17/2017 1856 06/23/2017 2244 Full Code [id number]  Nicholes Mango, MD Inpatient   02/18/2017 1623 02/18/2017 2133 Full Code [id number]  Delana Meyer, Dolores Lory, MD Inpatient   02/11/2017 1138 02/11/2017 1736 Full Code [id number]  Schnier, Dolores Lory, MD Inpatient   10/24/2014 1934 10/31/2014 1837 Full Code [id number]  Fritzi Mandes, MD Inpatient   10/11/2014 2347 10/17/2014 1844 Full Code [id number]  Lance Coon, MD Inpatient     Family Communication: As per critical care team Disposition Plan:  To be determined  Consultants:  Critical care specialist  Antibiotics:  Cefepime  Time spent: 35 minutes [place]

## 2017-10-14 NOTE — Consult Note (Signed)
Pharmacy Electrolyte Monitoring Consult:  Pharmacy consulted to assist in monitoring and replacing electrolytes in this 84 y.o. male admitted on 10/06/2017 with Cardiac Arrest   Labs:  Sodium (mmol/L)  Date Value  10/14/2017 146 (H)  07/04/2013 133 (L)   Potassium (mmol/L)  Date Value  10/14/2017 4.7  07/04/2013 4.3   Magnesium (mg/dL)  Date Value  16/10/9602 2.7 (H)   Phosphorus (mg/dL)  Date Value  54/09/8117 4.4   Calcium (mg/dL)  Date Value  14/78/2956 8.7 (L)   Calcium, Total (mg/dL)  Date Value  21/30/8657 8.7   Albumin (g/dL)  Date Value  84/69/6295 2.4 (L)  07/04/2013 3.6    Assessment/Plan: 1. Electrolytes: no replacement warranted. Potassium was 4.7 this morning. Patient did receive one dose of Lasix 40 mg IV.  2. Glucose mangagment: Levemir 10 units BID and SSI Q4hr. Patient receiving tube feeds. Glucose 124-194 over past 24 hours. Will continue to monitor and adjust per consult.   3. Constipation Management: Last BM 10/6. Continue daily Miralax and Senokot BID.   Pharmacy will continue to monitor and adjust per consult.   Mauri Reading, PharmD Pharmacy Resident  10/14/2017 1:19 PM

## 2017-10-14 NOTE — Consult Note (Signed)
Pharmacy Antibiotic Note  Billy Liu is a 84 y.o. male admitted on 10/06/2017 with cardiac arrest. Patient currently intubated with brief self-extubation on 10/7 and witnessed aspiration event. Patient with MRSA growing on left toe. Patient with allergy history significant for penicillin; no noted penicillin challenge in patient's medical history. Pharmacy has been consulted for Vancomycin + Meropenem dosing.  Plan: Renal function is unchanged; but vancomycin trough drawn appropriately prior to 4th dose was 19. Decrease dose to Vancomycin 1500 mg Q12h. Will order trough prior to 4th dose of new regimen (10/10 @ 0730).  Continue Merrem 1 g every 8 hours.  There is potential for drug-drug interaction with Merrem + valproic acid - Merrem decreased VPA levels resulting in possible breakthrough seizures; however, patient takes VPA for behavioral per [facility] records.   Kinetics Using Adjusted BW = 100.6 kg Adj CrCl 93 mL/min ke 0.082 Vd 70.4 T1/2 9.5 hours Cmin 15.33   Height: 6\' 3"  (190.5 cm) Weight: 275 lb 5.7 oz (124.9 kg) IBW/kg (Calculated) : 84.5  Temp (24hrs), Avg:97.4 F (36.3 C), Min:94 F (34.4 C), Max:99.3 F (37.4 C)  Recent Labs  Lab 10/08/17 0423 10/09/17 0331 10/10/17 0340 10/11/17 0516 10/12/17 1728 10/13/17 0904 10/14/17 0604  WBC 6.7 6.5 6.7 9.5  --  6.1  --   CREATININE 1.49* 1.26* 1.19 0.95 0.91 0.84 0.86    Estimated Creatinine Clearance: 92.7 mL/min (by C-G formula based on SCr of 0.86 mg/dL).    Allergies  Allergen Reactions  . Penicillins Other (See Comments)    Per MAR Has patient had a PCN reaction causing immediate rash, facial/tongue/throat swelling, SOB or lightheadedness with hypotension: Unknown Has patient had a PCN reaction causing severe rash involving mucus membranes or skin necrosis: Unknown Has patient had a PCN reaction that required hospitalization: Unknown Has patient had a PCN reaction occurring within the last 10 years:  Unknown If all of the above answers are "NO", then may proceed with Cephalosporin use.     Antimicrobials this admission: Cefepime 0930 >> 10/6 Vancomycin 0930 >> 10/1, 10/7 >> Meropenem 10/7 >>  Dose Adjustments Decreased to Vancomycin 1500 mg Q12H from Vancomycin 1250 mg every 8 hours.   Microbiology results: 0930 BCx: NG x 5 days 10/1 UCx: NG 10/1 Wound Cx toe - MRSA and resistance to erythromycin 10/1Wound Cx leg - multiple organisms; anaerobic 10/1 MRSA PCR: (-)  Thank you for allowing pharmacy to be a part of this patient's care.  Pharmacy will continue to monitor.  Mauri Reading, PharmD Pharmacy Resident  10/14/2017 1:25 PM

## 2017-10-14 NOTE — Progress Notes (Addendum)
Daily Progress Note   Patient Name: Billy Liu       Date: 10/14/2017 DOB: 1932-07-03  Age: 84 y.o. MRN#: [id number] Attending Physician: Dustin Flock, MD Primary Care Physician: Alvester Morin, MD Admit Date: 10/06/2017  Reason for Consultation/Follow-up: Psychosocial/spiritual support  Subjective: Patient is resting in bed on ventilator. No family at bedside today.  Restraints in place as patient self extubated yesterday. RT suctioning at this time due to decreased O2 sats. Per CCM, plans to arrange a discussion with family regarding a tracheostomy. It is not felt he will be able to wean from the ventilator.  Head CT and MRI completed with no acute changes. Per CCM, will hold off on contacting family for further conversation today.    Length of Stay: 7  Current Medications: Scheduled Meds:  . ALPRAZolam  0.5 mg Oral TID  . chlorhexidine gluconate (MEDLINE KIT)  15 mL Mouth Rinse BID  . famotidine  20 mg Per Tube BID  . feeding supplement (PRO-STAT SUGAR FREE 64)  60 mL Per Tube QID  . feeding supplement (VITAL HIGH PROTEIN)  1,000 mL Per Tube Q24H  . heparin  5,000 Units Subcutaneous Q8H  . Influenza vac split quadrivalent PF  0.5 mL Intramuscular Tomorrow-1000  . insulin aspart  0-20 Units Subcutaneous Q4H  . insulin detemir  10 Units Subcutaneous BID  . mouth rinse  15 mL Mouth Rinse 10 times per day  . multivitamin  15 mL Per Tube Daily  . polyethylene glycol  17 g Per Tube Daily  . senna-docusate  2 tablet Per Tube BID    Continuous Infusions: . sodium chloride Stopped (10/12/17 2141)  . dexmedetomidine (PRECEDEX) IV infusion 1 mcg/kg/hr (10/14/17 0914)  . fentaNYL infusion INTRAVENOUS 350 mcg/hr (10/14/17 1101)  . meropenem (MERREM) IV 1 g (10/14/17 4403)  .  propofol (DIPRIVAN) infusion 10 mcg/kg/min (10/14/17 1134)  . valproate sodium Stopped (10/14/17 0506)  . vancomycin 1,250 mg (10/14/17 0748)    PRN Meds: sodium chloride, acetaminophen **OR** acetaminophen, atropine, bisacodyl, fentaNYL, hydrALAZINE, HYDROcodone-acetaminophen, midazolam, midazolam, ondansetron **OR** ondansetron (ZOFRAN) IV, traZODone  Physical Exam  Constitutional: No distress.  Pulmonary/Chest:  Intubated            Vital Signs: BP (!) 153/59   Pulse 67   Temp 99.3 F (37.4 C) (Oral)  Resp 16   Ht '6\' 3"'  (1.905 m)   Wt 124.9 kg   SpO2 95%   BMI 34.42 kg/m  SpO2: SpO2: 95 % O2 Device: O2 Device: Ventilator O2 Flow Rate:    Intake/output summary:   Intake/Output Summary (Last 24 hours) at 10/14/2017 1223 Last data filed at 10/14/2017 0800 Gross per 24 hour  Intake 2028.92 ml  Output 900 ml  Net 1128.92 ml   LBM: Last BM Date: 10/12/17 Baseline Weight: Weight: 129.5 kg(bed weight) Most recent weight: Weight: 124.9 kg       Palliative Assessment/Data: Intubated    Flowsheet Rows     Most Recent Value  Intake Tab  Referral Department  Hospitalist  Unit at Time of Referral  ICU  Palliative Care Primary Diagnosis  Cardiac  Date Notified  10/07/17  Palliative Care Type  New Palliative care  Reason for referral  Clarify Goals of Care  Date of Admission  10/06/17  Date first seen by Palliative Care  10/07/17  # of days Palliative referral response time  0 Day(s)  # of days IP prior to Palliative referral  1  Clinical Assessment  Psychosocial & Spiritual Assessment  Palliative Care Outcomes      Patient Active Problem List   Diagnosis Date Noted  . Cardiac arrest ([place]) 10/07/2017  . Iron deficiency anemia due to chronic blood loss   . Symptomatic anemia 06/17/2017  . Pressure injury of skin 06/17/2017  . Atherosclerosis of native arteries of extremity with intermittent claudication ([place]) 12/25/2016  . Chronic venous insufficiency  12/25/2016  . Lymphedema 10/23/2016  . Hyperlipidemia 10/23/2016  . Chronic diastolic heart failure ([place]) 11/10/2014  . Obstructive sleep apnea 11/10/2014  . CAD (coronary artery disease) 10/11/2014  . COPD (chronic obstructive pulmonary disease) ([place]) 10/11/2014  . Type 2 diabetes mellitus ([place]) 10/11/2014  . HTN (hypertension) 10/11/2014  . GERD (gastroesophageal reflux disease) 10/11/2014  . CKD (chronic kidney disease), stage III ([place]) 10/11/2014    Palliative Care Assessment & Plan    Recommendations/Plan:  Continuing current care.     10/3 conversation: The brothers state they would want to try CPR once more in the case of cardiac arrest. They do not want a tracheostomy. They do not want a permanent feeding tube as this would not be an acceptable quality of life. At the point of maximized care with discontinued sedation and extubation, if he does not improve, discussed shift to comfort care.     Code Status:    Code Status Orders  (From admission, onward)         Start     Ordered   10/07/17 0132  Full code  Continuous     10/07/17 0131        Code Status History    Date Active Date Inactive Code Status Order ID Comments User Context   10/07/2017 0031 10/07/2017 0131 Full Code [id number]  Awilda Bill, NP ED   06/17/2017 1856 06/23/2017 2244 Full Code [id number]  Nicholes Mango, MD Inpatient   02/18/2017 1623 02/18/2017 2133 Full Code [id number]  Delana Meyer, Dolores Lory, MD Inpatient   02/11/2017 1138 02/11/2017 1736 Full Code [id number]  Schnier, Dolores Lory, MD Inpatient   10/24/2014 1934 10/31/2014 1837 Full Code [id number]  Fritzi Mandes, MD Inpatient   10/11/2014 2347 10/17/2014 1844 Full Code [id number]  Lance Coon, MD Inpatient       Prognosis:   Unable to determine  Discharge Planning:  To Be Determined  Care plan  was discussed with CCM.  Thank you for allowing the Palliative Medicine Team to assist in the care of this patient.   Total Time 15 min Prolonged  Time Billed  no      Greater than 50%  of this time was spent counseling and coordinating care related to the above assessment and plan.  Asencion Gowda, NP  Please contact Palliative Medicine Team phone at [phone] for questions and concerns.

## 2017-10-14 NOTE — Care Management (Signed)
Intensivist anticipates patient will most likely require trach. He will address ltac level of care with family

## 2017-10-15 ENCOUNTER — Inpatient Hospital Stay: Payer: Medicare Other

## 2017-10-15 DIAGNOSIS — Z9911 Dependence on respirator [ventilator] status: Secondary | ICD-10-CM

## 2017-10-15 LAB — GLUCOSE, CAPILLARY
GLUCOSE-CAPILLARY: 136 mg/dL — AB (ref 70–99)
GLUCOSE-CAPILLARY: 141 mg/dL — AB (ref 70–99)
GLUCOSE-CAPILLARY: 147 mg/dL — AB (ref 70–99)
GLUCOSE-CAPILLARY: 148 mg/dL — AB (ref 70–99)
GLUCOSE-CAPILLARY: 153 mg/dL — AB (ref 70–99)
Glucose-Capillary: 135 mg/dL — ABNORMAL HIGH (ref 70–99)

## 2017-10-15 LAB — BASIC METABOLIC PANEL
ANION GAP: 6 (ref 5–15)
BUN: 45 mg/dL — AB (ref 8–23)
CHLORIDE: 115 mmol/L — AB (ref 98–111)
CO2: 29 mmol/L (ref 22–32)
Calcium: 8.9 mg/dL (ref 8.9–10.3)
Creatinine, Ser: 0.93 mg/dL (ref 0.61–1.24)
Glucose, Bld: 164 mg/dL — ABNORMAL HIGH (ref 70–99)
POTASSIUM: 4.5 mmol/L (ref 3.5–5.1)
SODIUM: 150 mmol/L — AB (ref 135–145)

## 2017-10-15 LAB — CULTURE, RESPIRATORY W GRAM STAIN: Culture: NORMAL

## 2017-10-15 LAB — CBC
HEMATOCRIT: 30.7 % — AB (ref 39.0–52.0)
HEMOGLOBIN: 8.8 g/dL — AB (ref 13.0–17.0)
MCH: 26 pg (ref 26.0–34.0)
MCHC: 28.7 g/dL — AB (ref 30.0–36.0)
MCV: 90.6 fL (ref 80.0–100.0)
NRBC: 0.5 % — AB (ref 0.0–0.2)
Platelets: 234 10*3/uL (ref 150–400)
RBC: 3.39 MIL/uL — ABNORMAL LOW (ref 4.22–5.81)
RDW: 18.1 % — ABNORMAL HIGH (ref 11.5–15.5)
WBC: 6.2 10*3/uL (ref 4.0–10.5)

## 2017-10-15 LAB — CULTURE, RESPIRATORY

## 2017-10-15 MED ORDER — FREE WATER
100.0000 mL | Status: DC
  Administered 2017-10-15 – 2017-10-16 (×6): 100 mL

## 2017-10-15 NOTE — Consult Note (Signed)
Pharmacy Electrolyte Monitoring Consult:  Pharmacy consulted to assist in monitoring and replacing electrolytes in this 84 y.o. male admitted on 10/06/2017 with Cardiac Arrest   Labs:  Sodium (mmol/L)  Date Value  10/15/2017 150 (H)  07/04/2013 133 (L)   Potassium (mmol/L)  Date Value  10/15/2017 4.5  07/04/2013 4.3   Magnesium (mg/dL)  Date Value  09/81/1914 2.7 (H)   Phosphorus (mg/dL)  Date Value  78/29/5621 4.4   Calcium (mg/dL)  Date Value  30/86/5784 8.9   Calcium, Total (mg/dL)  Date Value  69/62/9528 8.7   Albumin (g/dL)  Date Value  41/32/4401 2.4 (L)  07/04/2013 3.6    Assessment/Plan: 1. Electrolytes: no replacement warranted. Potassium was 4.5 this morning. Will monitor sodium as it has increased to 150. Patient receiving 100 mL free water flushes q4h.  2. Glucose mangagment: Levemir 10 units BID and SSI Q4hr. Patient receiving tube feeds. Glucose 117-164 over past 24 hours. Will continue to monitor and adjust per consult.   3. Constipation Management: Last BM 10/8. Continue daily Miralax and Senokot BID.   Pharmacy will continue to monitor and adjust per consult.   Billy Liu, PharmD Pharmacy Resident  10/15/2017 1:04 PM

## 2017-10-15 NOTE — Progress Notes (Signed)
Patient ID: Billy Liu, male   DOB: 1932-10-11, 84 y.o.   MRN: [id number]  [employer] PROGRESS NOTE  Billy Liu YDX:[id number] DOB: [birth year] DOA: 10/06/2017 PCP: Alvester Morin, MD  HPI/Subjective: He remains intubated  Objective: Vitals:   10/15/17 1107 10/15/17 1200  BP:  (!) 148/55  Pulse:  73  Resp:  16  Temp:  97.6 F (36.4 C)  SpO2: 98% 98%    Filed Weights   10/13/17 0456 10/14/17 0350 10/15/17 0434  Weight: 124.7 kg 124.9 kg 130.3 kg    ROS: Review of Systems  Unable to perform ROS: Acuity of condition   Exam: Physical Exam  Constitutional: He is intubated.  HENT:  Nose: Mucosal edema present.  Unable to look into mouth  Eyes: Conjunctivae and lids are normal.  Pupils pinpoint  Neck: Carotid bruit is not present.  Cardiovascular: Regular rhythm, S1 normal, S2 normal and normal heart sounds.  Respiratory: He is intubated. He has decreased breath sounds in the right lower field and the left lower field. He has no wheezes. He has rhonchi in the right lower field and the left lower field.  GI: Bowel sounds are normal. There is no tenderness.  Musculoskeletal:       Right ankle: He exhibits swelling.       Left ankle: He exhibits swelling.  Neurological:  Opens eyes and moves arms with stimulation  Skin: Skin is warm.  Right posterior calf large area of greenish discoloration and foul-smelling in an ulcer.  Left foot first toe with small ulceration and very swollen first toe.  Likely from chronic infection  Psychiatric:  Opens eyes and moves his arms to stimulation      Data Reviewed: Basic Metabolic Panel: Recent Labs  Lab 10/11/17 0516  10/12/17 1728 10/13/17 0452 10/13/17 0904 10/14/17 0604 10/15/17 0435  NA 144  --  144  --  145 146* 150*  K 5.0   < > 5.1 5.1 4.9 4.7 4.5  CL 111  --  113*  --  110 113* 115*  CO2 28  --  28  --  _0 GLUCOSE 253*  --  255*  --  194* 161* 164*  BUN 45*  --  47*  --  39* 40* 45*   CREATININE 0.95  --  0.91  --  0.84 0.86 0.93  CALCIUM 8.4*  --  8.5*  --  8.7* 8.7* 8.9  MG  --   --  2.7*  --   --   --   --    < > = values in this interval not displayed.   Liver Function Tests: Recent Labs  Lab 10/13/17 0904  AST 18  ALT 17  ALKPHOS 60  BILITOT 0.3  PROT 6.4*  ALBUMIN 2.4*   CBC: Recent Labs  Lab 10/09/17 0331 10/10/17 0340 10/11/17 0516 10/13/17 0904 10/15/17 0435  WBC 6.5 6.7 9.5 6.1 6.2  NEUTROABS  --   --  7.6* 4.4  --   HGB 8.1* 8.5* 8.5* 8.8* 8.8*  HCT 24.1* 26.0* 27.8* 28.0* 30.7*  MCV 84.9 86.1 85.8 86.1 90.6  PLT 231 244 267 249 234   Cardiac Enzymes: No results for input(s): CKTOTAL, CKMB, CKMBINDEX, TROPONINI in the last 168 hours.  CBG: Recent Labs  Lab 10/14/17 2003 10/14/17 2325 10/15/17 0355 10/15/17 0731 10/15/17 1153  GLUCAP 117* 130* 148* 147* 141*    Recent Results (from the past 240 hour(s))  Blood culture (routine x 2)  Status: None   Collection Time: 10/06/17 10:43 PM  Result Value Ref Range Status   Specimen Description BLOOD LEFT HAND   Final   Special Requests   Final    BOTTLES DRAWN AEROBIC AND ANAEROBIC Blood Culture adequate volume   Culture   Final    NO GROWTH 5 DAYS Performed at [facility], [address] [place], [place] [address]    Report Status 10/11/2017 FINAL  Final  Blood culture (routine x 2)     Status: None   Collection Time: 10/06/17 10:44 PM  Result Value Ref Range Status   Specimen Description BLOOD BLOOD LEFT FOREARM  Final   Special Requests   Final    BOTTLES DRAWN AEROBIC AND ANAEROBIC Blood Culture adequate volume   Culture   Final    NO GROWTH 5 DAYS Performed at [facility], [address], [place], [place] [address]    Report Status 10/11/2017 FINAL  Final  MRSA PCR Screening     Status: None   Collection Time: 10/07/17  1:07 AM  Result Value Ref Range Status   MRSA by PCR NEGATIVE NEGATIVE Final    Comment:        The GeneXpert MRSA Assay  (FDA approved for NASAL specimens only), is one component of a comprehensive MRSA colonization surveillance program. It is not intended to diagnose MRSA infection nor to guide or monitor treatment for MRSA infections. Performed at [facility], [address] [place], [place] [address]   Urine Culture     Status: None   Collection Time: 10/07/17  2:24 AM  Result Value Ref Range Status   Specimen Description   Final    URINE, RANDOM Performed at [facility], [address] [place], [place] [address]    Special Requests   Final    NONE Performed at [facility], [address], [place], [place] [address]    Culture   Final    NO GROWTH Performed at [facility], [place] [address] [place], [place] [address]    Report Status 10/08/2017 FINAL  Final  Aerobic/Anaerobic Culture (surgical/deep wound)     Status: None   Collection Time: 10/07/17  2:24 AM  Result Value Ref Range Status   Specimen Description TOE LEFT  Final   Special Requests WOUND  Final   Gram Stain   Final    RARE SQUAMOUS EPITHELIAL CELLS PRESENT NO WBC SEEN MODERATE GRAM POSITIVE RODS FEW GRAM POSITIVE COCCI RARE GRAM NEGATIVE RODS    Culture   Final    FEW METHICILLIN RESISTANT STAPHYLOCOCCUS AUREUS WITHIN MIXED ORGANISMS NO ANAEROBES ISOLATED Performed at [facility], [place] [address], [place], [place] [address]    Report Status 10/12/2017 FINAL  Final   Organism ID, Bacteria METHICILLIN RESISTANT STAPHYLOCOCCUS AUREUS  Final      Susceptibility   Methicillin resistant staphylococcus aureus - MIC*    CIPROFLOXACIN <=0.5 SENSITIVE Sensitive     ERYTHROMYCIN >=8 RESISTANT Resistant     GENTAMICIN <=0.5 SENSITIVE Sensitive     OXACILLIN >=4 RESISTANT Resistant     TETRACYCLINE <=1 SENSITIVE Sensitive     VANCOMYCIN 1 SENSITIVE Sensitive     TRIMETH/SULFA <=10 SENSITIVE Sensitive     CLINDAMYCIN <=0.25 SENSITIVE Sensitive     RIFAMPIN <=0.5 SENSITIVE  Sensitive     Inducible Clindamycin NEGATIVE Sensitive     * FEW METHICILLIN RESISTANT STAPHYLOCOCCUS AUREUS  Aerobic/Anaerobic Culture (surgical/deep wound)  Status: Abnormal   Collection Time: 10/07/17  2:24 AM  Result Value Ref Range Status   Specimen Description   Final    LEG RIGHT Performed at [facility], [address] [place], [place] [address]    Special Requests   Final    NONE Performed at [facility], [place], [place] [address]    Gram Stain   Final    NO WBC SEEN MODERATE GRAM NEGATIVE RODS FEW GRAM POSITIVE RODS RARE GRAM POSITIVE COCCI Performed at [facility], [place] [address], [place], [place] [address]    Culture (A)  Final    MULTIPLE ORGANISMS PRESENT, NONE PREDOMINANT MIXED ANAEROBIC FLORA PRESENT.  CALL LAB IF FURTHER IID REQUIRED.    Report Status 10/12/2017 FINAL  Final  Culture, respiratory (non-expectorated)     Status: None   Collection Time: 10/13/17 12:50 PM  Result Value Ref Range Status   Specimen Description   Final    TRACHEAL ASPIRATE Performed at [facility], [address], [place], [place] [address]    Special Requests   Final    NONE Performed at [facility], [address] [place], [place] [address]    Gram Stain   Final    FEW WBC PRESENT, PREDOMINANTLY PMN MODERATE GRAM POSITIVE RODS FEW GRAM VARIABLE ROD FEW GRAM NEGATIVE RODS RARE GRAM POSITIVE COCCI    Culture   Final    Consistent with normal respiratory flora. Performed at [facility], [place] [address] [place], [place] [address]    Report Status 10/15/2017 FINAL  Final     Studies: No results found.  Scheduled Meds: . ALPRAZolam  0.5 mg Oral TID  . chlorhexidine gluconate (MEDLINE KIT)  15 mL Mouth Rinse BID  . famotidine  20 mg Per Tube BID  . feeding supplement (PRO-STAT SUGAR FREE 64)  60 mL Per Tube QID  . feeding supplement (VITAL HIGH PROTEIN)  1,000 mL Per Tube Q24H  . free water   100 mL Per Tube Q4H  . heparin  5,000 Units Subcutaneous Q8H  . Influenza vac split quadrivalent PF  0.5 mL Intramuscular Tomorrow-1000  . insulin aspart  0-20 Units Subcutaneous Q4H  . insulin detemir  10 Units Subcutaneous BID  . mouth rinse  15 mL Mouth Rinse 10 times per day  . multivitamin  15 mL Per Tube Daily  . polyethylene glycol  17 g Per Tube Daily  . senna-docusate  2 tablet Per Tube BID   Continuous Infusions: . sodium chloride Stopped (10/15/17 0428)  . dexmedetomidine (PRECEDEX) IV infusion Stopped (10/14/17 1234)  . fentaNYL infusion INTRAVENOUS 350 mcg/hr (10/15/17 0740)  . meropenem (MERREM) IV 1 g (10/15/17 1311)  . propofol (DIPRIVAN) infusion 35 mcg/kg/min (10/15/17 1046)  . valproate sodium 250 mg (10/15/17 1222)  . vancomycin      Assessment/Plan:  1. Cardiac arrest.  Patient underwent hypothermia protocol.  Continue supportive care.  MRI shows old stroke neurology following,   2. Acute hypoxic respiratory failure.  Continue ventilator support.  Continues to require 30% FiO2 intubation per pulmonary critical care 3. left lower lobe pneumonia, right lower extremity ulceration with foul-smelling discharge, left first toe ulceration and swelling of the first toe likely chronic infection.  Antibiotics per the ICU team hypotension.  Off pressors 4. Acute kidney injury and hyperkalemia.   This has improved. 5. History of chronic diastolic congestive heart failure 6. Overall prognosis poor and high  risk for cardiopulmonary arrest.  Patient listed as full code. 7. Anemia.  Responded to transfusion.    Code Status:     Code Status Orders  (From admission, onward)         Start     Ordered   10/07/17 0132  Full code  Continuous     10/07/17 0131        Code Status History    Date Active Date Inactive Code Status Order ID Comments User Context   10/07/2017 0031 10/07/2017 0131 Full Code [id number]  Awilda Bill, NP ED   06/17/2017 1856 06/23/2017 2244 Full  Code [id number]  Nicholes Mango, MD Inpatient   02/18/2017 1623 02/18/2017 2133 Full Code [id number]  Delana Meyer, Dolores Lory, MD Inpatient   02/11/2017 1138 02/11/2017 1736 Full Code [id number]  Schnier, Dolores Lory, MD Inpatient   10/24/2014 1934 10/31/2014 1837 Full Code [id number]  Fritzi Mandes, MD Inpatient   10/11/2014 2347 10/17/2014 1844 Full Code [id number]  Lance Coon, MD Inpatient     Family Communication: As per critical care team Disposition Plan: To be determined  Consultants:  Critical care specialist  Antibiotics:  Cefepime  Time spent: 35 minutes [place]

## 2017-10-15 NOTE — Progress Notes (Signed)
Daily Progress Note   Patient Name: Billy Liu       Date: 10/15/2017 DOB: 1932/04/11  Age: 84 y.o. MRN#: [id number] Attending Physician: Dustin Flock, MD Primary Care Physician: Alvester Morin, MD Admit Date: 10/06/2017  Reason for Consultation/Follow-up: Psychosocial/spiritual support  Subjective: Patient is resting in bed on ventilator. No family at bedside today.  Brother [name] called to check on patient during visit. Spoke with brother [name] following [employer] him. He states he has been kept updated on his brother's status. Discussed previous conversation of if the patient would want a tracheostomy. We discussed his 2 unsuccessful self extubations, agitation, restraints, and concerns as it pertains to a trach. Discussed quality of life. He would like to see how his brother does through the end of the week.   Length of Stay: 8  Current Medications: Scheduled Meds:  . ALPRAZolam  0.5 mg Oral TID  . chlorhexidine gluconate (MEDLINE KIT)  15 mL Mouth Rinse BID  . famotidine  20 mg Per Tube BID  . feeding supplement (PRO-STAT SUGAR FREE 64)  60 mL Per Tube QID  . feeding supplement (VITAL HIGH PROTEIN)  1,000 mL Per Tube Q24H  . free water  100 mL Per Tube Q4H  . heparin  5,000 Units Subcutaneous Q8H  . Influenza vac split quadrivalent PF  0.5 mL Intramuscular Tomorrow-1000  . insulin aspart  0-20 Units Subcutaneous Q4H  . insulin detemir  10 Units Subcutaneous BID  . mouth rinse  15 mL Mouth Rinse 10 times per day  . multivitamin  15 mL Per Tube Daily  . polyethylene glycol  17 g Per Tube Daily  . senna-docusate  2 tablet Per Tube BID    Continuous Infusions: . sodium chloride Stopped (10/15/17 0428)  . dexmedetomidine (PRECEDEX) IV infusion Stopped (10/14/17  1234)  . fentaNYL infusion INTRAVENOUS 350 mcg/hr (10/15/17 0740)  . meropenem (MERREM) IV Stopped (10/15/17 7096)  . propofol (DIPRIVAN) infusion 35 mcg/kg/min (10/15/17 1046)  . valproate sodium 250 mg (10/15/17 1222)  . vancomycin      PRN Meds: sodium chloride, acetaminophen **OR** acetaminophen, atropine, bisacodyl, fentaNYL, hydrALAZINE, HYDROcodone-acetaminophen, midazolam, midazolam, ondansetron **OR** ondansetron (ZOFRAN) IV, traZODone  Physical Exam  Constitutional: No distress.  Pulmonary/Chest:  Intubated            Vital Signs: BP [name])  148/55   Pulse 73   Temp 97.6 F (36.4 C) (Axillary)   Resp 16   Ht _0  (1.905 m)   Wt 130.3 kg   SpO2 98%   BMI 35.90 kg/m  SpO2: SpO2: 98 % O2 Device: O2 Device: Ventilator O2 Flow Rate:    Intake/output summary:   Intake/Output Summary (Last 24 hours) at 10/15/2017 1225 Last data filed at 10/15/2017 1213 Gross per 24 hour  Intake 3179.92 ml  Output 2275 ml  Net 904.92 ml   LBM: Last BM Date: 10/14/17 Baseline Weight: Weight: 129.5 kg(bed weight) Most recent weight: Weight: 130.3 kg       Palliative Assessment/Data: Intubated    Flowsheet Rows     Most Recent Value  Intake Tab  Referral Department  Hospitalist  Unit at Time of Referral  ICU  Palliative Care Primary Diagnosis  Cardiac  Date Notified  10/07/17  Palliative Care Type  New Palliative care  Reason for referral  Clarify Goals of Care  Date of Admission  10/06/17  Date first seen by Palliative Care  10/07/17  # of days Palliative referral response time  0 Day(s)  # of days IP prior to Palliative referral  1  Clinical Assessment  Psychosocial & Spiritual Assessment  Palliative Care Outcomes      Patient Active Problem List   Diagnosis Date Noted  . Cardiac arrest ([place]) 10/07/2017  . Iron deficiency anemia due to chronic blood loss   . Symptomatic anemia 06/17/2017  . Pressure injury of skin 06/17/2017  . Atherosclerosis of native arteries  of extremity with intermittent claudication ([place]) 12/25/2016  . Chronic venous insufficiency 12/25/2016  . Lymphedema 10/23/2016  . Hyperlipidemia 10/23/2016  . Chronic diastolic heart failure ([place]) 11/10/2014  . Obstructive sleep apnea 11/10/2014  . CAD (coronary artery disease) 10/11/2014  . COPD (chronic obstructive pulmonary disease) ([place]) 10/11/2014  . Type 2 diabetes mellitus ([place]) 10/11/2014  . HTN (hypertension) 10/11/2014  . GERD (gastroesophageal reflux disease) 10/11/2014  . CKD (chronic kidney disease), stage III ([place]) 10/11/2014    Palliative Care Assessment & Plan    Recommendations/Plan:  Continuing current care.      Code Status:    Code Status Orders  (From admission, onward)         Start     Ordered   10/07/17 0132  Full code  Continuous     10/07/17 0131        Code Status History    Date Active Date Inactive Code Status Order ID Comments User Context   10/07/2017 0031 10/07/2017 0131 Full Code [id number]  Awilda Bill, NP ED   06/17/2017 1856 06/23/2017 2244 Full Code [id number]  Nicholes Mango, MD Inpatient   02/18/2017 1623 02/18/2017 2133 Full Code [id number]  Delana Meyer, Dolores Lory, MD Inpatient   02/11/2017 1138 02/11/2017 1736 Full Code [id number]  Schnier, Dolores Lory, MD Inpatient   10/24/2014 1934 10/31/2014 1837 Full Code [id number]  Fritzi Mandes, MD Inpatient   10/11/2014 2347 10/17/2014 1844 Full Code [id number]  Lance Coon, MD Inpatient       Prognosis:   Unable to determine  Discharge Planning:  To Be Determined    Thank you for allowing the Palliative Medicine Team to assist in the care of this patient.   Total Time 15 min Prolonged Time Billed  no      Greater than 50%  of this time was spent counseling and coordinating care related to the above assessment and plan.  Asencion Gowda, NP  Please contact Palliative Medicine Team phone at [phone] for questions and concerns.

## 2017-10-15 NOTE — Progress Notes (Signed)
SUBJECTIVE: Pt remains intubated and unresponsive.   Vitals:   10/15/17 1000 10/15/17 1100 10/15/17 1107 10/15/17 1200  BP: (!) 148/58 (!) 154/61  (!) 148/55  Pulse: 72 69  73  Resp: 16 16  16   Temp:    97.6 F (36.4 C)  TempSrc:    Axillary  SpO2: 99% 98% 98% 98%  Weight:      Height:        Intake/Output Summary (Last 24 hours) at 10/15/2017 1409 Last data filed at 10/15/2017 1213 Gross per 24 hour  Intake 2945.56 ml  Output 2075 ml  Net 870.56 ml    LABS: Basic Metabolic Panel: Recent Labs    10/12/17 1728  10/14/17 0604 10/15/17 0435  NA 144   < > 146* 150*  K 5.1   < > 4.7 4.5  CL 113*   < > 113* 115*  CO2 28   < > 30 29  GLUCOSE 255*   < > 161* 164*  BUN 47*   < > 40* 45*  CREATININE 0.91   < > 0.86 0.93  CALCIUM 8.5*   < > 8.7* 8.9  MG 2.7*  --   --   --    < > = values in this interval not displayed.   Liver Function Tests: Recent Labs    10/13/17 0904  AST 18  ALT 17  ALKPHOS 60  BILITOT 0.3  PROT 6.4*  ALBUMIN 2.4*   No results for input(s): LIPASE, AMYLASE in the last 72 hours. CBC: Recent Labs    10/13/17 0904 10/15/17 0435  WBC 6.1 6.2  NEUTROABS 4.4  --   HGB 8.8* 8.8*  HCT 28.0* 30.7*  MCV 86.1 90.6  PLT 249 234   Cardiac Enzymes: No results for input(s): CKTOTAL, CKMB, CKMBINDEX, TROPONINI in the last 72 hours. BNP: Invalid input(s): POCBNP D-Dimer: No results for input(s): DDIMER in the last 72 hours. Hemoglobin A1C: No results for input(s): HGBA1C in the last 72 hours. Fasting Lipid Panel: No results for input(s): CHOL, HDL, LDLCALC, TRIG, CHOLHDL, LDLDIRECT in the last 72 hours. Thyroid Function Tests: No results for input(s): TSH, T4TOTAL, T3FREE, THYROIDAB in the last 72 hours.  Invalid input(s): FREET3 Anemia Panel: No results for input(s): VITAMINB12, FOLATE, FERRITIN, TIBC, IRON, RETICCTPCT in the last 72 hours.   PHYSICAL EXAM General: Intubated, critically ill HEENT:  Normocephalic and atramatic Neck:  No  JVD.  Lungs: Clear bilaterally to auscultation and percussion. Heart: HRRR . Normal S1 and S2 without gallops or murmurs.  Abdomen: Bowel sounds are positive, abdomen soft and non-tender  Msk:  Back normal, normal gait. Normal strength and tone for age. Extremities: Very swollen/red extremities  Neuro: Unresponsive, off sedatio TELEMETRY: NSR 72bpm  ASSESSMENT AND PLAN: Status post cardiac arrest with pneumonia and respiratory failure patient remains intubated and unresponsive.  Failed self extubation x 2, may require trach. Hemodynamically stable at the moment, in normal sinus rhythm.   Active Problems:   Cardiac arrest [facility])    Caroleen Hamman, NP-C 10/15/2017 2:09 PM Cell: [phone]

## 2017-10-15 NOTE — Consult Note (Signed)
Pharmacy Antibiotic Note  Billy Liu is a 84 y.o. male admitted on 10/06/2017 with cardiac arrest. Patient currently intubated with brief self-extubation on 10/7 and witnessed aspiration event. Patient with MRSA growing on left toe. Patient with allergy history significant for penicillin; no noted penicillin challenge in patient's medical history. Pharmacy has been consulted for Vancomycin + Meropenem dosing.  Plan: 10/9 Continue Vancomycin 1500 mg Q12h. Will check trough prior to 4th dose of new regimen (10/10 @ 0730).  Continue Merrem 1 g every 8 hours.  There is potential for drug-drug interaction with Merrem + valproic acid - Merrem decreased VPA levels resulting in possible breakthrough seizures; however, patient takes VPA for behavioral per [facility] records.  Height: 6\' 3"  (190.5 cm) Weight: 287 lb 4.2 oz (130.3 kg) IBW/kg (Calculated) : 84.5  Temp (24hrs), Avg:97.2 F (36.2 C), Min:96.3 F (35.7 C), Max:98.1 F (36.7 C)  Recent Labs  Lab 10/09/17 0331 10/10/17 0340 10/11/17 0516 10/12/17 1728 10/13/17 0904 10/14/17 0604 10/14/17 1430 10/15/17 0435  WBC 6.5 6.7 9.5  --  6.1  --   --  6.2  CREATININE 1.26* 1.19 0.95 0.91 0.84 0.86  --  0.93  VANCOTROUGH  --   --   --   --   --   --  19  --     Estimated Creatinine Clearance: 87.5 mL/min (by C-G formula based on SCr of 0.93 mg/dL).    Allergies  Allergen Reactions  . Penicillins Other (See Comments)    Per MAR Has patient had a PCN reaction causing immediate rash, facial/tongue/throat swelling, SOB or lightheadedness with hypotension: Unknown Has patient had a PCN reaction causing severe rash involving mucus membranes or skin necrosis: Unknown Has patient had a PCN reaction that required hospitalization: Unknown Has patient had a PCN reaction occurring within the last 10 years: Unknown If all of the above answers are "NO", then may proceed with Cephalosporin use.     Antimicrobials this admission: Cefepime 0930 >>  10/6 Vancomycin 0930 >> 10/1, 10/7 >> Meropenem 10/7 >>  Dose Adjustments 10/8 Decreased to Vancomycin 1500 mg Q12H from Vancomycin 1250 mg every 8 hours.   Microbiology results: 0930 BCx: NG x 5 days 10/1 UCx: NG 10/1 Wound Cx toe - MRSA and resistance to erythromycin 10/1Wound Cx leg - multiple organisms; anaerobic 10/1 MRSA PCR: (-) 10/7 Resp Cx: few WBCx, moderate GPCs  Thank you for allowing pharmacy to be a part of this patient's care.  Pharmacy will continue to monitor.  Mauri Reading, PharmD Pharmacy Resident  10/15/2017 12:59 PM

## 2017-10-15 NOTE — Progress Notes (Signed)
I spoke with pts brothers Fayrene Fearing and Latavion Halls regarding goals of treatment.  I informed them both we have been unable to wean the pt from the ventilator and the next step would be tracheostomy placement.  I also informed pts brothers that if they choose to proceed with tracheostomy placement there is no guarantee the pt can be liberated from the ventilator. They stated they understood and would like to proceed tracheostomy placement.   Sonda Rumble, AGNP  Pulmonary/Critical Care Pager [phone] (please enter 7 digits) PCCM Consult Pager [phone] (please enter 7 digits)

## 2017-10-15 NOTE — Progress Notes (Signed)
Follow up - Critical Care Medicine Note  Patient Details:    Billy Liu is an 84 y.o. male.  With past medical history remarkable for hypertension, hyperlipidemia, gastroesophageal reflux disease, depression, Alzheimer's, coronary artery disease, COPD, stage III renal failure, chronic diastolic heart failure,  admitted with acute on chronic renal failure,  status post cardiac arrest, mechanically intubated, hypothermic protocol initiated @36  degrees C.   Lines, Airways, Drains: Airway 7.5 mm (Active)  Secured at (cm) 24 cm 10/10/2017  8:15 AM  Measured From Lips 10/10/2017  8:15 AM  Secured Location Left 10/10/2017  8:15 AM  Secured By [employer] 10/10/2017  8:15 AM  Tube Holder Repositioned Yes 10/10/2017  8:15 AM  Cuff Pressure (cm H2O) 28 cm H2O 10/10/2017  8:15 AM  Site Condition Dry 10/10/2017  8:15 AM     NG/OG Tube Orogastric Left mouth Xray Documented cm marking at nare/ corner of mouth 76 cm (Active)  Cm Marking at Nare/Corner of Mouth (if applicable) 70 cm 10/10/2017  8:00 AM  Site Assessment Clean;Dry;Intact 10/10/2017  8:00 AM  Ongoing Placement Verification No change in cm markings or external length of tube from initial placement;No acute changes, not attributed to clinical condition;No change in respiratory status 10/10/2017  8:00 AM  Status Infusing tube feed 10/10/2017  8:00 AM  Intake (mL) 120 mL 10/08/2017  6:14 PM     Urethral Catheter Urology MD Coude 16 Fr. (Active)  Indication for Insertion or Continuance of Catheter Unstable critical patients (first 24-48 hours);Bladder outlet obstruction / other urologic reason 10/10/2017  8:00 AM  Site Assessment Intact;Swelling;Edema 10/08/2017  7:45 AM  Catheter Maintenance Bag below level of bladder;Catheter secured;Drainage bag/tubing not touching floor;Insertion date on drainage bag;No dependent loops;Seal intact 10/10/2017  8:00 AM  Collection Container Standard drainage bag 10/10/2017  8:00 AM  Securement Method Tape  10/10/2017  8:00 AM  Urinary Catheter Interventions Unclamped 10/10/2017  8:00 AM  Output (mL) 1100 mL 10/10/2017  6:01 AM    Anti-infectives:  Anti-infectives (From admission, onward)   Start     Dose/Rate Route Frequency Ordered Stop   10/15/17 2000  vancomycin (VANCOCIN) 1,500 mg in sodium chloride 0.9 % 500 mL IVPB     1,500 mg 250 mL/hr over 120 Minutes Intravenous Every 12 hours 10/14/17 1516     10/13/17 2300  vancomycin (VANCOCIN) 1,250 mg in sodium chloride 0.9 % 250 mL IVPB  Status:  Discontinued     1,250 mg 166.7 mL/hr over 90 Minutes Intravenous Every 8 hours 10/13/17 1721 10/14/17 1516   10/13/17 2100  vancomycin (VANCOCIN) 1,250 mg in sodium chloride 0.9 % 250 mL IVPB  Status:  Discontinued     1,250 mg 166.7 mL/hr over 90 Minutes Intravenous Every 8 hours 10/13/17 1333 10/13/17 1721   10/13/17 1430  vancomycin (VANCOCIN) 1,250 mg in sodium chloride 0.9 % 250 mL IVPB     1,250 mg 166.7 mL/hr over 90 Minutes Intravenous  Once 10/13/17 1333 10/13/17 1803   10/13/17 1430  meropenem (MERREM) 1 g in sodium chloride 0.9 % 100 mL IVPB     1 g 200 mL/hr over 30 Minutes Intravenous Every 8 hours 10/13/17 1333     10/10/17 2200  ceFEPIme (MAXIPIME) 2 g in sodium chloride 0.9 % 100 mL IVPB  Status:  Discontinued     2 g 200 mL/hr over 30 Minutes Intravenous Every 12 hours 10/10/17 1557 10/12/17 0845   10/07/17 0600  ceFEPIme (MAXIPIME) 2 g in sodium chloride  0.9 % 100 mL IVPB  Status:  Discontinued     2 g 200 mL/hr over 30 Minutes Intravenous Every 24 hours 10/07/17 0050 10/07/17 0210   10/07/17 0600  vancomycin (VANCOCIN) 1,500 mg in sodium chloride 0.9 % 500 mL IVPB  Status:  Discontinued     1,500 mg 250 mL/hr over 120 Minutes Intravenous Every 24 hours 10/07/17 0050 10/07/17 1136   10/07/17 0600  ceFEPIme (MAXIPIME) 2 g in sodium chloride 0.9 % 100 mL IVPB  Status:  Discontinued     2 g 200 mL/hr over 30 Minutes Intravenous Every 12 hours 10/07/17 0210 10/09/17 1124    10/06/17 2130  ceFEPIme (MAXIPIME) 1 g in sodium chloride 0.9 % 100 mL IVPB     1 g 200 mL/hr over 30 Minutes Intravenous  Once 10/06/17 2126 10/07/17 0016   10/06/17 2130  vancomycin (VANCOCIN) IVPB 1000 mg/200 mL premix     1,000 mg 200 mL/hr over 60 Minutes Intravenous  Once 10/06/17 2126 10/07/17 0115      Microbiology: Results for orders placed or performed during the hospital encounter of 10/06/17  Blood culture (routine x 2)     Status: None   Collection Time: 10/06/17 10:43 PM  Result Value Ref Range Status   Specimen Description BLOOD LEFT HAND   Final   Special Requests   Final    BOTTLES DRAWN AEROBIC AND ANAEROBIC Blood Culture adequate volume   Culture   Final    NO GROWTH 5 DAYS Performed at [facility], [address] [place], [place] [address]    Report Status 10/11/2017 FINAL  Final  Blood culture (routine x 2)     Status: None   Collection Time: 10/06/17 10:44 PM  Result Value Ref Range Status   Specimen Description BLOOD BLOOD LEFT FOREARM  Final   Special Requests   Final    BOTTLES DRAWN AEROBIC AND ANAEROBIC Blood Culture adequate volume   Culture   Final    NO GROWTH 5 DAYS Performed at [facility], [address] [place], [place] [address]    Report Status 10/11/2017 FINAL  Final  MRSA PCR Screening     Status: None   Collection Time: 10/07/17  1:07 AM  Result Value Ref Range Status   MRSA by PCR NEGATIVE NEGATIVE Final    Comment:        The GeneXpert MRSA Assay (FDA approved for NASAL specimens only), is one component of a comprehensive MRSA colonization surveillance program. It is not intended to diagnose MRSA infection nor to guide or monitor treatment for MRSA infections. Performed at [facility], [address], [place], [place] [address]   Urine Culture     Status: None   Collection Time: 10/07/17  2:24 AM  Result Value Ref Range Status   Specimen Description   Final    URINE, RANDOM Performed at  [facility], [address], [place], [place] [address]    Special Requests   Final    NONE Performed at [facility], [address], [place], [place] [address]    Culture   Final    NO GROWTH Performed at [facility], [address] [place], [place] [address]    Report Status 10/08/2017 FINAL  Final  Aerobic/Anaerobic Culture (surgical/deep wound)     Status: None   Collection Time: 10/07/17  2:24 AM  Result Value Ref Range Status   Specimen Description TOE LEFT  Final  Special Requests WOUND  Final   Gram Stain   Final    RARE SQUAMOUS EPITHELIAL CELLS PRESENT NO WBC SEEN MODERATE GRAM POSITIVE RODS FEW GRAM POSITIVE COCCI RARE GRAM NEGATIVE RODS    Culture   Final    FEW METHICILLIN RESISTANT STAPHYLOCOCCUS AUREUS WITHIN MIXED ORGANISMS NO ANAEROBES ISOLATED Performed at [facility], [address] [place], [place] [address]    Report Status 10/12/2017 FINAL  Final   Organism ID, Bacteria METHICILLIN RESISTANT STAPHYLOCOCCUS AUREUS  Final      Susceptibility   Methicillin resistant staphylococcus aureus - MIC*    CIPROFLOXACIN <=0.5 SENSITIVE Sensitive     ERYTHROMYCIN >=8 RESISTANT Resistant     GENTAMICIN <=0.5 SENSITIVE Sensitive     OXACILLIN >=4 RESISTANT Resistant     TETRACYCLINE <=1 SENSITIVE Sensitive     VANCOMYCIN 1 SENSITIVE Sensitive     TRIMETH/SULFA <=10 SENSITIVE Sensitive     CLINDAMYCIN <=0.25 SENSITIVE Sensitive     RIFAMPIN <=0.5 SENSITIVE Sensitive     Inducible Clindamycin NEGATIVE Sensitive     * FEW METHICILLIN RESISTANT STAPHYLOCOCCUS AUREUS  Aerobic/Anaerobic Culture (surgical/deep wound)     Status: Abnormal   Collection Time: 10/07/17  2:24 AM  Result Value Ref Range Status   Specimen Description   Final    LEG RIGHT Performed at [facility], [address] [place], [place] [address]    Special Requests   Final    NONE Performed at [facility], [address]  [place], [place] [address]    Gram Stain   Final    NO WBC SEEN MODERATE GRAM NEGATIVE RODS FEW GRAM POSITIVE RODS RARE GRAM POSITIVE COCCI Performed at [facility], [address] [place], [place] [address]    Culture (A)  Final    MULTIPLE ORGANISMS PRESENT, NONE PREDOMINANT MIXED ANAEROBIC FLORA PRESENT.  CALL LAB IF FURTHER IID REQUIRED.    Report Status 10/12/2017 FINAL  Final  Culture, respiratory (non-expectorated)     Status: None (Preliminary result)   Collection Time: 10/13/17 12:50 PM  Result Value Ref Range Status   Specimen Description   Final    TRACHEAL ASPIRATE Performed at [facility], [address], [place], [place] [address]    Special Requests   Final    NONE Performed at [facility], [address] [place], [place] [address]    Gram Stain   Final    FEW WBC PRESENT, PREDOMINANTLY PMN MODERATE GRAM POSITIVE RODS FEW GRAM VARIABLE ROD FEW GRAM NEGATIVE RODS RARE GRAM POSITIVE COCCI    Culture   Final    TOO YOUNG TO READ Performed at [facility], [address] [place], [place] [address]    Report Status PENDING  Incomplete   Studies: Dg Chest 1 View  Result Date: 10/07/2017 CLINICAL DATA:  Feeding tube, evaluate placement of endotracheal tube EXAM: CHEST  1 VIEW COMPARISON:  Portable chest x-ray of 10/06/2016 FINDINGS: The tip of the endotracheal tube is approximately 3.9 cm above the carina. The lungs remain poorly aerated and cardiomegaly is stable. Haziness at both lung bases may reflect atelectasis and effusion although pneumonia cannot be excluded. NG tube extends below the hemidiaphragm. IMPRESSION: 1. Endotracheal tube tip 3.9 cm above the carina. 2. Poor aeration with basilar volume loss, probable effusions, and pneumonia cannot be excluded. 3. Stable cardiomegaly. 4. NG tube extends below the hemidiaphragm. Electronically Signed   By: Dwyane Dee  M.D.   On: 10/07/2017 14:03   Dg Abd 1 View  Result Date:  10/07/2017 CLINICAL DATA:  Evaluate position of feeding tube EXAM: ABDOMEN - 1 VIEW COMPARISON:  Abdomen 10/07/2016 FINDINGS: A tip of the feeding tube is seen within the fundus dex proximal body of the stomach. Bowel gas pattern is nonspecific. Some volume loss is again noted at the lung bases and cardiomegaly is stable IMPRESSION: Feeding tube tip extends to the region of the fundus-proximal stomach. The bowel gas pattern is nonspecific Electronically Signed   By: Dwyane Dee M.D.   On: 10/07/2017 14:04   Dg Abd 1 View  Result Date: 10/07/2017 CLINICAL DATA:  84 year old male with nasogastric tube placement. Subsequent encounter. EXAM: ABDOMEN - 1 VIEW COMPARISON:  10/07/2017 2:36 a.m. FINDINGS: Nasogastric tube radiopaque marker can only be well delineated to the level of the distal esophagus. Tip of the nasogastric tube may enter the stomach but is not adequately assessed secondary to overlying leads in the left upper quadrant. Removal of leads or change of lead position and repeat exam may prove helpful for further delineation. Nonspecific bowel gas pattern. IMPRESSION: Nasogastric tube radiopaque marker can only be well delineated to the level of the distal esophagus. Tip of the nasogastric tube may enter the stomach but is not adequately assessed secondary to overlying leads in the left upper quadrant. Removal of leads or change of lead position and repeat exam may prove helpful for further delineation. These results will be called to the ordering clinician or representative by the Radiologist Assistant, and communication documented in the PACS or zVision Dashboard. Electronically Signed   By: Lacy Duverney M.D.   On: 10/07/2017 08:32   Dg Abd 1 View  Result Date: 10/07/2017 CLINICAL DATA:  OG tube placement, second attempt EXAM: ABDOMEN - 1 VIEW COMPARISON:  10/07/2017 at 0228 hours FINDINGS: Enteric tube terminates in the gastric cardia with its side port at/just above the GE junction. IMPRESSION:  Enteric tube terminates in the gastric cardia with its side port at/just above the GE junction. Electronically Signed   By: Charline Bills M.D.   On: 10/07/2017 02:48   Dg Abd 1 View  Result Date: 10/07/2017 CLINICAL DATA:  OG tube placement EXAM: ABDOMEN - 1 VIEW COMPARISON:  10/06/2017 FINDINGS: Enteric tube terminates at the GE junction with the side port in the distal esophagus. IMPRESSION: Enteric tube terminates at the GE junction with the side port in the distal esophagus. Electronically Signed   By: Charline Bills M.D.   On: 10/07/2017 02:47   Dg Abd 1 View  Result Date: 10/06/2017 CLINICAL DATA:  OG tube EXAM: ABDOMEN - 1 VIEW COMPARISON:  None. FINDINGS: By history patient has an OG tube. Just to the RIGHT of the thoracic spine, there is a radiopaque line, possibly an orogastric tube. However this is slightly to the RIGHT of the expected course of the esophagus and does not extend into the LEFT UPPER QUADRANT. It is possible that orogastric tube has been withdrawn or removed and not included on the film. There is dense opacification of the LEFT lung base. Visualized bowel gas pattern is nonobstructive. IMPRESSION: Possible orogastric tube to the distal esophagus, but this appears slightly to the RIGHT of the expected course of the esophagus. Recommend confirmation of orogastric tube placement and consider advancing tube further into the stomach if this is felt to be the orogastric tube. Significant LEFT LOWER lobe opacity. Electronically Signed   By: Norva Pavlov M.D.  On: 10/06/2017 21:00   Ct Head Wo Contrast  Result Date: 10/09/2017 CLINICAL DATA:  Question anoxic brain injury.  Cardiac arrest EXAM: CT HEAD WITHOUT CONTRAST TECHNIQUE: Contiguous axial images were obtained from the base of the skull through the vertex without intravenous contrast. 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 Small remote lateral left frontal  infarct best seen on reformats. There is generalized atrophy. Small remote left cerebellar infarct. No detected acute infarct or swelling. No hemorrhage, hydrocephalus, or collection. Vascular: Atherosclerotic calcification.  No hyperdense vessel. Skull: Remote left frontal craniotomy.  No acute finding Sinuses/Orbits: No acute finding IMPRESSION: 1. No evident anoxic injury. 2. 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 Electronically Signed   By: Marnee Spring M.D.   On: 10/09/2017 14:46   Ct Head Wo Contrast  Result Date: 10/06/2017 CLINICAL DATA:  Altered mental status (AMS), unclear cause; C-spine trauma, high clinical risk (NEXUS/CCR). Collapsed in the bathroom. EXAM: CT HEAD WITHOUT CONTRAST CT CERVICAL SPINE WITHOUT CONTRAST TECHNIQUE: Multidetector CT imaging of the head and cervical spine was performed following the standard protocol without intravenous contrast. Multiplanar CT image reconstructions of the cervical spine were also generated. COMPARISON:  Head CT 06/17/2017 FINDINGS: CT HEAD FINDINGS Brain: Unchanged degree of atrophy and chronic small vessel ischemia. Encephalomalacia in the left parietal lobe again seen. No intracranial hemorrhage, mass effect, or midline shift. No hydrocephalus. The basilar cisterns are patent. No evidence of territorial infarct or acute ischemia. No extra-axial or intracranial fluid collection. Vascular: Atherosclerosis of skullbase vasculature without hyperdense vessel or abnormal calcification. Skull: Prior left frontoparietal craniotomy.  No skull fracture. Sinuses/Orbits: Paranasal sinuses and mastoid air cells are clear. The visualized orbits are unremarkable. Bilateral cataract resection. Other: None. CT CERVICAL SPINE FINDINGS Alignment: Straightening of normal cervical lordosis. Skull base and vertebrae: No acute fracture. Bulky anterior osteophytes from C3-C4 through C6-C7, well corticated lucency through the osteophytes at C4 and C5 may represent  remote prior trauma. The dens and skull base are intact. Soft tissues and spinal canal: Debris in the hypopharynx limits assessment for prevertebral edema, no gross prevertebral soft tissue thickening. No evidence of canal hematoma. Disc levels: Near complete disc space loss at C2-C3 with bony ankylosis of the posterior elements on the left. Disc space narrowing at C5-C6 and C6-C7. 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 Bulky anterior osteophytes throughout, some of which are fragmented. Multilevel facet arthropathy. Upper chest: Moderate right pleural effusion, partially included. Other: Soft tissue prominence involving the right aspect of the hypopharynx is incompletely included in the field of view, causing mild leftward endotracheal and enteric tube deviation. There are dense vascular calcifications. IMPRESSION: 1.  No acute intracranial abnormality.  No skull fracture. 2. Unchanged atrophy, chronic small vessel ischemia, and left parietal encephalomalacia. 3. Advanced multilevel degenerative change in the cervical spine without evidence of acute fracture. 4. Soft tissue prominence involving the right hypopharynx is only partially included in the field of view, this may represent retained secretions or asymmetric positioning of the patient's tongue, however underlying mass lesion is not excluded. Consider contrast enhanced CT of the neck as clinically indicated. 5. Moderate right pleural effusion partially included in the field of view. Electronically Signed   By: Narda Rutherford M.D.   On: 10/06/2017 21:38   Ct Cervical Spine Wo Contrast  Result Date: 10/06/2017 CLINICAL DATA:  Altered mental status (AMS), unclear cause; C-spine trauma, high clinical risk (NEXUS/CCR). Collapsed in the bathroom. EXAM:  CT HEAD WITHOUT CONTRAST CT CERVICAL SPINE WITHOUT CONTRAST TECHNIQUE: Multidetector CT imaging of the head and cervical spine was performed following the standard protocol without  intravenous contrast. Multiplanar CT image reconstructions of the cervical spine were also generated. COMPARISON:  Head CT 06/17/2017 FINDINGS: CT HEAD FINDINGS Brain: Unchanged degree of atrophy and chronic small vessel ischemia. Encephalomalacia in the left parietal lobe again seen. No intracranial hemorrhage, mass effect, or midline shift. No hydrocephalus. The basilar cisterns are patent. No evidence of territorial infarct or acute ischemia. No extra-axial or intracranial fluid collection. Vascular: Atherosclerosis of skullbase vasculature without hyperdense vessel or abnormal calcification. Skull: Prior left frontoparietal craniotomy.  No skull fracture. Sinuses/Orbits: Paranasal sinuses and mastoid air cells are clear. The visualized orbits are unremarkable. Bilateral cataract resection. Other: None. CT CERVICAL SPINE FINDINGS Alignment: Straightening of normal cervical lordosis. Skull base and vertebrae: No acute fracture. Bulky anterior osteophytes from C3-C4 through C6-C7, well corticated lucency through the osteophytes at C4 and C5 may represent remote prior trauma. The dens and skull base are intact. Soft tissues and spinal canal: Debris in the hypopharynx limits assessment for prevertebral edema, no gross prevertebral soft tissue thickening. No evidence of canal hematoma. Disc levels: Near complete disc space loss at C2-C3 with bony ankylosis of the posterior elements on the left. Disc space narrowing at C5-C6 and C6-C7. 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 Bulky anterior osteophytes throughout, some of which are fragmented. Multilevel facet arthropathy. Upper chest: Moderate right pleural effusion, partially included. Other: Soft tissue prominence involving the right aspect of the hypopharynx is incompletely included in the field of view, causing mild leftward endotracheal and enteric tube deviation. There are dense vascular calcifications. IMPRESSION: 1.  No acute intracranial  abnormality.  No skull fracture. 2. Unchanged atrophy, chronic small vessel ischemia, and left parietal encephalomalacia. 3. Advanced multilevel degenerative change in the cervical spine without evidence of acute fracture. 4. Soft tissue prominence involving the right hypopharynx is only partially included in the field of view, this may represent retained secretions or asymmetric positioning of the patient's tongue, however underlying mass lesion is not excluded. Consider contrast enhanced CT of the neck as clinically indicated. 5. Moderate right pleural effusion partially included in the field of view. Electronically Signed   By: Narda Rutherford M.D.   On: 10/06/2017 21:38   Mr Brain Wo Contrast  Result Date: 10/10/2017 CLINICAL DATA:  Altered mental status. EXAM: MRI HEAD WITHOUT CONTRAST TECHNIQUE: Multiplanar, multiecho pulse sequences of the brain and surrounding structures were obtained without intravenous contrast. COMPARISON:  Head CT 10/09/2017 FINDINGS: The study is mildly motion degraded. Brain: There is no evidence of acute infarct, intracranial hemorrhage, mass, midline shift, or extra-axial fluid collection. Small chronic infarcts are again noted in the left parietal lobe and left cerebellum. There is also minimal cortical encephalomalacia laterally in the left frontal lobe subjacent to the craniotomy. Bilateral periventricular white matter T2 hyperintensities are nonspecific but compatible with mild chronic small vessel ischemic disease. There is mild-to-moderate cerebral atrophy. Vascular: Major intracranial vascular flow voids are preserved. Skull and upper cervical spine: Left frontoparietal craniotomy. Upper cervical disc degeneration. Sinuses/Orbits: Bilateral cataract extraction. Mild bilateral maxillary and sphenoid sinus mucosal thickening. Large bilateral mastoid effusions and fluid in the pharynx in the setting of intubation. Other: None. IMPRESSION: 1. No acute intracranial  abnormality. 2. Chronic left parietal, left frontal, and left cerebellar infarcts. 3. Mild chronic small vessel ischemia in the cerebral white matter. Electronically Signed   By:  Sebastian Ache M.D.   On: 10/10/2017 16:52   Dg Pelvis Portable  Result Date: 10/10/2017 CLINICAL DATA:  MRI screening for metal EXAM: PORTABLE PELVIS 1-2 VIEWS COMPARISON:  10/07/2017, 11/07/2009 radiographs FINDINGS: No metallic density radiopaque foreign body is seen within the included portions of the bony pelvis and overlying soft tissues. SI joints are non widened. There is a catheter or probe inferior to the pubic symphysis. No fracture or malalignment although right femoral neck is obscured by the trochanter. Phleboliths in the right pelvis. There may be chronic avulsion injury at the left ischium. IMPRESSION: Negative. Electronically Signed   By: Jasmine Pang M.D.   On: 10/10/2017 15:27   Dg Chest Port 1 View  Result Date: 10/13/2017 CLINICAL DATA:  Endotracheal and orogastric tubes. EXAM: PORTABLE CHEST 1 VIEW COMPARISON:  Earlier today. FINDINGS: The endotracheal tube tip is unchanged, 3.9 cm above the carina. Orogastric tube extending into the stomach. Stable enlarged cardiac silhouette. Mild decrease in prominence of the pulmonary vasculature and interstitial markings. Poorly defined probable small bilateral pleural effusions. Interval linear density at the left lung base and poorly defined increased density with volume loss at the right lung base. IMPRESSION: 1. Mildly improved changes of congestive heart failure. 2. Interval bibasilar atelectasis. Electronically Signed   By: Beckie Salts M.D.   On: 10/13/2017 13:33   Dg Chest Port 1 View  Result Date: 10/13/2017 CLINICAL DATA:  On mechanically assisted ventilation, CHF, COPD EXAM: PORTABLE CHEST 1 VIEW COMPARISON:  Portable chest x-ray of 10/11/2017 and 10/09/2017 FINDINGS: The tip of the endotracheal tube is approximately 3.8 cm above the carina. Bilateral pleural  effusions again are noted with some airspace disease most consistent with congestive heart failure and cardiomegaly is noted. NG tube extends below the hemidiaphragm. There are diffuse degenerative changes throughout the thoracic spine. IMPRESSION: 1. Little change in probable CHF with effusions, cardiomegaly and pulmonary edema. 2. Tip of endotracheal tube 3.8 cm above the carina. Electronically Signed   By: Dwyane Dee M.D.   On: 10/13/2017 08:52   Dg Chest Port 1 View  Result Date: 10/11/2017 CLINICAL DATA:  Ventilator EXAM: PORTABLE CHEST 1 VIEW COMPARISON:  10/09/2017 FINDINGS: Endotracheal tube is 9 cm above the carina. Cardiomegaly with vascular congestion. Bilateral perihilar lower lobe opacities and layering effusions are similar prior study. NG tube is in the stomach. IMPRESSION: Bilateral layering effusions with cardiomegaly and bilateral perihilar and lower lobe airspace opacities, likely edema and/or atelectasis. Electronically Signed   By: Charlett Nose M.D.   On: 10/11/2017 08:26   Dg Chest Port 1 View  Result Date: 10/09/2017 CLINICAL DATA:  Acute respiratory failure. EXAM: PORTABLE CHEST 1 VIEW COMPARISON:  Radiograph 10/07/2017 FINDINGS: Endotracheal tube 4.4 cm from the carina. Enteric tube in place with tip and side-port below the diaphragm. Cardiomegaly is unchanged. Worsening hazy opacities throughout both lungs. Bibasilar volume loss again seen with associated effusions. No pneumothorax. IMPRESSION: Worsening lung aeration with increasing hazy opacities throughout both lungs, likely combination of pleural fluid, atelectasis, and pulmonary edema. Electronically Signed   By: Narda Rutherford M.D.   On: 10/09/2017 04:06   Dg Chest Portable 1 View  Result Date: 10/06/2017 CLINICAL DATA:  Ett tube EXAM: PORTABLE CHEST 1 VIEW COMPARISON:  06/20/2017 FINDINGS: Interval placement of endotracheal tube, tip approximately 3.0 centimeters above the carina. An orogastric tube is in place, tip  overlying the level of the LOWER esophagus but difficult to confirm. The heart is enlarged. There is dense opacity  in the LEFT lung base. Perihilar opacities are consistent with pulmonary edema. IMPRESSION: Interval placement of endotracheal tube and orogastric tube. LEFT LOWER lobe opacity and pulmonary edema. Electronically Signed   By: Norva Pavlov M.D.   On: 10/06/2017 21:02   Dg Abd Portable 1v  Result Date: 10/13/2017 CLINICAL DATA:  Orogastric tube placement. EXAM: PORTABLE ABDOMEN - 1 VIEW COMPARISON:  Portable chest obtained at the same time. FINDINGS: Orogastric tube tip in the mid to distal stomach and side hole in the proximal to mid stomach. The included portion of the bowel gas pattern is normal. See the portable chest report for the chest findings. Lumbar and thoracic spine degenerative changes. IMPRESSION: Orogastric tube tip and side hole in the stomach. Electronically Signed   By: Beckie Salts M.D.   On: 10/13/2017 13:34   Dg Abd Portable 1v  Result Date: 10/07/2017 CLINICAL DATA:  Orogastric tube placement. EXAM: PORTABLE ABDOMEN - 1 VIEW COMPARISON:  Radiograph of same day. FINDINGS: The bowel gas pattern is normal. Distal tip of nasogastric tube is seen in proximal stomach, with side hole at expected position of gastroesophageal junction. No radio-opaque calculi or other significant radiographic abnormality are seen. IMPRESSION: Distal tip of nasogastric tube seen in proximal stomach, with side hole at expected position of gastroesophageal junction. Advancement is recommended. Electronically Signed   By: Lupita Raider, M.D.   On: 10/07/2017 12:18    Consults: Treatment Team:  Pccm, Raymond Gurney, MD Laurier Nancy, MD Kym Groom, MD   Subjective:    Overnight Issues: Patient yesterday had a self extubation, required repeat intubation, mucopurulence noted at vocal cords, bronchoscopy performed revealed diffuse pneumonia, started on vancomycin and  meropenem.  Objective:  Vital signs for last 24 hours: Temp:  [96.3 F (35.7 C)-99 F (37.2 C)] 97.6 F (36.4 C) (10/09 0800) Pulse Rate:  [55-107] 74 (10/09 0800) Resp:  [16-27] 16 (10/09 0800) BP: (86-165)/(44-88) 145/58 (10/09 0800) SpO2:  [92 %-100 %] 99 % (10/09 0800) FiO2 (%):  [30 %] 30 % (10/09 0740) Weight:  [130.3 kg] 130.3 kg (10/09 0434)  Hemodynamic parameters for last 24 hours:    Intake/Output from previous day: 10/08 0701 - 10/09 0700 In: 5344.1 [I.V.:3773; NG/GT:761; IV Piggyback:810.1] Out: 1975 [Urine:1975]  Intake/Output this shift: Total I/O In: -  Out: 250 [Urine:250]  Vent settings for last 24 hours: Vent Mode: PRVC FiO2 (%):  [30 %] 30 % Set Rate:  [16 bmp] 16 bmp Vt Set:  [500 mL] 500 mL PEEP:  [5 cmH20] 5 cmH20 Plateau Pressure:  [18 cmH20-22 cmH20] 22 cmH20  Physical Exam:  Vital signs: Please see the above listed vital signs HEENT: Trachea midline, orally intubated, no accessory muscle utilization Cardiovascular: Regular rate and rhythm Abdominal: Positive bowel sounds, soft exam Extremities: No clubbing cyanosis, 1+ edema appreciated Neurologic: Patient is sedated, will track with eyes but no purposeful response  Assessment/Plan:   Status post cardiac arrest.  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  Being followed by neurology.  Post EEG and MRI, please see reports, patient is off sedation, appreciate neurology input  Hypoxemic/hypercapnic respiratory failure.  Patient had a failed self extubation required repeat intubation and bronchoscopy for mucopurulent pneumonia, on vancomycin and meropenem.  Patient now has had 2 unsuccessful extubations, will most likely require tracheostomy.  Pending am CXR  Will discuss with family  Renal failure.  Prerenal indices, BUN 45/creatinine 0..93  Anemia.  No evidence of active bleeding hemoglobin 8.8/hematocrit 28  Wound culture  positive for staph with some methicillin-resistant species  Critical Care Total Time 30 minutes  Analie Katzman 10/15/2017  *Care during the described time interval was provided by me and/or other providers on the critical care team.  I have reviewed this patient's available data, including medical history, events of note, physical examination and test results as part of my evaluation. Patient ID: Reece Fehnel, male   DOB: 1932/01/20, 84 y.o.   MRN: [id number] Patient ID: Tyrome Donatelli, male   DOB: 04/09/1932, 84 y.o.   MRN: [id number] Patient ID: Keno Caraway, male   DOB: 07-03-1932, 84 y.o.   MRN: [id number] Patient ID: Jaxyn Rout, male   DOB: Jun 02, 1932, 84 y.o.   MRN: [id number] Patient ID: Davine Coba, male   DOB: [birth year], 84 y.o.   MRN: [id number]

## 2017-10-16 ENCOUNTER — Inpatient Hospital Stay: Payer: Medicare Other

## 2017-10-16 LAB — CBC WITH DIFFERENTIAL/PLATELET
ABS IMMATURE GRANULOCYTES: 0.01 10*3/uL (ref 0.00–0.07)
BASOS PCT: 1 %
Basophils Absolute: 0 10*3/uL (ref 0.0–0.1)
EOS ABS: 0.4 10*3/uL (ref 0.0–0.5)
Eosinophils Relative: 9 %
HCT: 26.7 % — ABNORMAL LOW (ref 39.0–52.0)
Hemoglobin: 7.7 g/dL — ABNORMAL LOW (ref 13.0–17.0)
IMMATURE GRANULOCYTES: 0 %
Lymphocytes Relative: 14 %
Lymphs Abs: 0.7 10*3/uL (ref 0.7–4.0)
MCH: 26 pg (ref 26.0–34.0)
MCHC: 28.8 g/dL — ABNORMAL LOW (ref 30.0–36.0)
MCV: 90.2 fL (ref 80.0–100.0)
Monocytes Absolute: 0.5 10*3/uL (ref 0.1–1.0)
Monocytes Relative: 9 %
NEUTROS ABS: 3.4 10*3/uL (ref 1.7–7.7)
NEUTROS PCT: 67 %
PLATELETS: 222 10*3/uL (ref 150–400)
RBC: 2.96 MIL/uL — AB (ref 4.22–5.81)
RDW: 17.8 % — AB (ref 11.5–15.5)
WBC: 5 10*3/uL (ref 4.0–10.5)
nRBC: 0.4 % — ABNORMAL HIGH (ref 0.0–0.2)

## 2017-10-16 LAB — BASIC METABOLIC PANEL
ANION GAP: 4 — AB (ref 5–15)
BUN: 44 mg/dL — ABNORMAL HIGH (ref 8–23)
CHLORIDE: 115 mmol/L — AB (ref 98–111)
CO2: 30 mmol/L (ref 22–32)
Calcium: 8.7 mg/dL — ABNORMAL LOW (ref 8.9–10.3)
Creatinine, Ser: 0.85 mg/dL (ref 0.61–1.24)
GFR calc non Af Amer: >60 mL/min (ref >60)
Glucose, Bld: 151 mg/dL — ABNORMAL HIGH (ref 70–99)
POTASSIUM: 4.3 mmol/L (ref 3.5–5.1)
Sodium: 149 mmol/L — ABNORMAL HIGH (ref 135–145)

## 2017-10-16 LAB — GLUCOSE, CAPILLARY
GLUCOSE-CAPILLARY: 145 mg/dL — AB (ref 70–99)
GLUCOSE-CAPILLARY: 126 mg/dL — AB (ref 70–99)
Glucose-Capillary: 139 mg/dL — ABNORMAL HIGH (ref 70–99)
Glucose-Capillary: 141 mg/dL — ABNORMAL HIGH (ref 70–99)
Glucose-Capillary: 142 mg/dL — ABNORMAL HIGH (ref 70–99)
Glucose-Capillary: 143 mg/dL — ABNORMAL HIGH (ref 70–99)

## 2017-10-16 LAB — VANCOMYCIN, TROUGH: VANCOMYCIN TR: 18 ug/mL (ref 15–20)

## 2017-10-16 LAB — ALBUMIN: Albumin: 2.4 g/dL — ABNORMAL LOW (ref 3.5–5.0)

## 2017-10-16 MED ORDER — FUROSEMIDE 10 MG/ML IJ SOLN
40.0000 mg | Freq: Two times a day (BID) | INTRAMUSCULAR | Status: DC
  Administered 2017-10-16 – 2017-10-23 (×15): 40 mg via INTRAVENOUS
  Filled 2017-10-16 (×15): qty 4

## 2017-10-16 MED ORDER — FREE WATER
200.0000 mL | Status: DC
  Administered 2017-10-16 – 2017-10-21 (×30): 200 mL

## 2017-10-16 NOTE — Consult Note (Signed)
Pharmacy Antibiotic Note  Vedansh Kerstetter is a 84 y.o. male admitted on 10/06/2017 with cardiac arrest. Patient currently intubated with brief self-extubation on 10/7 and witnessed aspiration event. Patient with MRSA growing on left toe. Patient with allergy history significant for penicillin; no noted penicillin challenge in patient's medical history. Pharmacy has been consulted for Vancomycin + Meropenem dosing.  Plan: Discontinuing Vancomycin and continuing Merrem 1 g every 8 hours x 7 days per discussion during ICU rounds.  There is potential for drug-drug interaction with Merrem + valproic acid - Merrem decreased VPA levels resulting in possible breakthrough seizures; however, patient takes VPA for behavioral per [facility] records.  Height: 6\' 3"  (190.5 cm) Weight: 282 lb 13.6 oz (128.3 kg) IBW/kg (Calculated) : 84.5  Temp (24hrs), Avg:97.3 F (36.3 C), Min:93.7 F (34.3 C), Max:101.7 F (38.7 C)  Recent Labs  Lab 10/10/17 0340 10/11/17 0516 10/12/17 1728 10/13/17 0904 10/14/17 0604 10/14/17 1430 10/15/17 0435 10/16/17 0304 10/16/17 0715  WBC 6.7 9.5  --  6.1  --   --  6.2 5.0  --   CREATININE 1.19 0.95 0.91 0.84 0.86  --  0.93 0.85  --   VANCOTROUGH  --   --   --   --   --  19  --   --  18    Estimated Creatinine Clearance: 95 mL/min (by C-G formula based on SCr of 0.85 mg/dL).    Allergies  Allergen Reactions  . Penicillins Other (See Comments)    Per MAR Has patient had a PCN reaction causing immediate rash, facial/tongue/throat swelling, SOB or lightheadedness with hypotension: Unknown Has patient had a PCN reaction causing severe rash involving mucus membranes or skin necrosis: Unknown Has patient had a PCN reaction that required hospitalization: Unknown Has patient had a PCN reaction occurring within the last 10 years: Unknown If all of the above answers are "NO", then may proceed with Cephalosporin use.     Antimicrobials this admission: Cefepime 0930 >>  10/6 Vancomycin 0930 >> 10/1, 10/7 >>10/10 Meropenem 10/7 >> 10/13  Dose Adjustments 10/8 Decreased to Vancomycin 1500 mg Q12H from Vancomycin 1250 mg every 8 hours.   Microbiology results: 0930 BCx: NG x 5 days 10/1 UCx: NG 10/1 Wound Cx toe - MRSA and resistance to erythromycin 10/1Wound Cx leg - multiple organisms; anaerobic 10/1 MRSA PCR: (-) 10/7 Resp Cx: few WBCx, moderate GPCs  Thank you for allowing pharmacy to be a part of this patient's care.  Pharmacy will continue to monitor.  Mauri Reading, PharmD Pharmacy Resident  10/16/2017 11:48 AM

## 2017-10-16 NOTE — Progress Notes (Signed)
Patient ID: Billy Liu, male   DOB: 05-31-1932, 84 y.o.   MRN: [id number]  [employer] PROGRESS NOTE  Billy Liu:[id number] DOB: [birth year] DOA: 10/06/2017 PCP: Billy Morin, MD  HPI/Subjective: Remains intubated critically ill  Objective: Vitals:   10/16/17 1100 10/16/17 1200  BP: (!) 139/51   Pulse: 77   Resp: 17   Temp:  97.9 F (36.6 C)  SpO2: 97%     Filed Weights   10/14/17 0350 10/15/17 0434 10/16/17 0500  Weight: 124.9 kg 130.3 kg 128.3 kg    ROS: Review of Systems  Unable to perform ROS: Acuity of condition   Exam: Physical Exam  Constitutional: He is intubated.  HENT:  Nose: Mucosal edema present.  Unable to look into mouth  Eyes: Conjunctivae and lids are normal.  Pupils pinpoint  Neck: Carotid bruit is not present.  Cardiovascular: Regular rhythm, S1 normal, S2 normal and normal heart sounds.  Respiratory: He is intubated. He has decreased breath sounds in the right lower field and the left lower field. He has no wheezes. He has rhonchi in the right lower field and the left lower field.  GI: Bowel sounds are normal. There is no tenderness.  Musculoskeletal:       Right ankle: He exhibits swelling.       Left ankle: He exhibits swelling.  Skin: Skin is warm.      Data Reviewed: Basic Metabolic Panel: Recent Labs  Lab 10/12/17 1728 10/13/17 0452 10/13/17 0904 10/14/17 0604 10/15/17 0435 10/16/17 0304  NA 144  --  145 146* 150* 149*  K 5.1 5.1 4.9 4.7 4.5 4.3  CL 113*  --  110 113* 115* 115*  CO2 28  --  '28 30 29 30  ' GLUCOSE 255*  --  194* 161* 164* 151*  BUN 47*  --  39* 40* 45* 44*  CREATININE 0.91  --  0.84 0.86 0.93 0.85  CALCIUM 8.5*  --  8.7* 8.7* 8.9 8.7*  MG 2.7*  --   --   --   --   --    Liver Function Tests: Recent Labs  Lab 10/13/17 0904 10/16/17 0715  AST 18  --   ALT 17  --   ALKPHOS 60  --   BILITOT 0.3  --   PROT 6.4*  --   ALBUMIN 2.4* 2.4*   CBC: Recent Labs  Lab 10/10/17 0340  10/11/17 0516 10/13/17 0904 10/15/17 0435 10/16/17 0304  WBC 6.7 9.5 6.1 6.2 5.0  NEUTROABS  --  7.6* 4.4  --  3.4  HGB 8.5* 8.5* 8.8* 8.8* 7.7*  HCT 26.0* 27.8* 28.0* 30.7* 26.7*  MCV 86.1 85.8 86.1 90.6 90.2  PLT 244 267 249 234 222   Cardiac Enzymes: No results for input(s): CKTOTAL, CKMB, CKMBINDEX, TROPONINI in the last 168 hours.  CBG: Recent Labs  Lab 10/15/17 1924 10/15/17 2342 10/16/17 0344 10/16/17 0723 10/16/17 1133  GLUCAP 136* 153* 141* 145* 142*    Recent Results (from the past 240 hour(s))  Blood culture (routine x 2)     Status: None   Collection Time: 10/06/17 10:43 PM  Result Value Ref Range Status   Specimen Description BLOOD LEFT HAND   Final   Special Requests   Final    BOTTLES DRAWN AEROBIC AND ANAEROBIC Blood Culture adequate volume   Culture   Final    NO GROWTH 5 DAYS Performed at [facility], [address] [place], [place] [address]  Report Status 10/11/2017 FINAL  Final  Blood culture (routine x 2)     Status: None   Collection Time: 10/06/17 10:44 PM  Result Value Ref Range Status   Specimen Description BLOOD BLOOD LEFT FOREARM  Final   Special Requests   Final    BOTTLES DRAWN AEROBIC AND ANAEROBIC Blood Culture adequate volume   Culture   Final    NO GROWTH 5 DAYS Performed at [facility], [address] [place], [place] [address]    Report Status 10/11/2017 FINAL  Final  MRSA PCR Screening     Status: None   Collection Time: 10/07/17  1:07 AM  Result Value Ref Range Status   MRSA by PCR NEGATIVE NEGATIVE Final    Comment:        The GeneXpert MRSA Assay (FDA approved for NASAL specimens only), is one component of a comprehensive MRSA colonization surveillance program. It is not intended to diagnose MRSA infection nor to guide or monitor treatment for MRSA infections. Performed at [facility], [address], [place], [place] [address]   Urine Culture     Status: None   Collection  Time: 10/07/17  2:24 AM  Result Value Ref Range Status   Specimen Description   Final    URINE, RANDOM Performed at [facility], [address] [place], [place] [address]    Special Requests   Final    NONE Performed at [facility], [address] [place], [place] [address]    Culture   Final    NO GROWTH Performed at [facility], [place] [address], [place], [place] [address]    Report Status 10/08/2017 FINAL  Final  Aerobic/Anaerobic Culture (surgical/deep wound)     Status: None   Collection Time: 10/07/17  2:24 AM  Result Value Ref Range Status   Specimen Description TOE LEFT  Final   Special Requests WOUND  Final   Gram Stain   Final    RARE SQUAMOUS EPITHELIAL CELLS PRESENT NO WBC SEEN MODERATE GRAM POSITIVE RODS FEW GRAM POSITIVE COCCI RARE GRAM NEGATIVE RODS    Culture   Final    FEW METHICILLIN RESISTANT STAPHYLOCOCCUS AUREUS WITHIN MIXED ORGANISMS NO ANAEROBES ISOLATED Performed at [facility], [place] [address] [place], [place] [address]    Report Status 10/12/2017 FINAL  Final   Organism ID, Bacteria METHICILLIN RESISTANT STAPHYLOCOCCUS AUREUS  Final      Susceptibility   Methicillin resistant staphylococcus aureus - MIC*    CIPROFLOXACIN <=0.5 SENSITIVE Sensitive     ERYTHROMYCIN >=8 RESISTANT Resistant     GENTAMICIN <=0.5 SENSITIVE Sensitive     OXACILLIN >=4 RESISTANT Resistant     TETRACYCLINE <=1 SENSITIVE Sensitive     VANCOMYCIN 1 SENSITIVE Sensitive     TRIMETH/SULFA <=10 SENSITIVE Sensitive     CLINDAMYCIN <=0.25 SENSITIVE Sensitive     RIFAMPIN <=0.5 SENSITIVE Sensitive     Inducible Clindamycin NEGATIVE Sensitive     * FEW METHICILLIN RESISTANT STAPHYLOCOCCUS AUREUS  Aerobic/Anaerobic Culture (surgical/deep wound)     Status: Abnormal   Collection Time: 10/07/17  2:24 AM  Result Value Ref Range Status   Specimen Description   Final    LEG RIGHT Performed at [place] [address]  [place], [place] [address]    Special Requests   Final    NONE Performed at [facility], [address] [place], [place] [address]    Gram Stain  Final    NO WBC SEEN MODERATE GRAM NEGATIVE RODS FEW GRAM POSITIVE RODS RARE GRAM POSITIVE COCCI Performed at [facility], [place] [address], [place], [place] [address]    Culture (A)  Final    MULTIPLE ORGANISMS PRESENT, NONE PREDOMINANT MIXED ANAEROBIC FLORA PRESENT.  CALL LAB IF FURTHER IID REQUIRED.    Report Status 10/12/2017 FINAL  Final  Culture, respiratory (non-expectorated)     Status: None   Collection Time: 10/13/17 12:50 PM  Result Value Ref Range Status   Specimen Description   Final    TRACHEAL ASPIRATE Performed at [facility], [address] [place], [place] [address]    Special Requests   Final    NONE Performed at [facility], [address] [place], [place] [address]    Gram Stain   Final    FEW WBC PRESENT, PREDOMINANTLY PMN MODERATE GRAM POSITIVE RODS FEW GRAM VARIABLE ROD FEW GRAM NEGATIVE RODS RARE GRAM POSITIVE COCCI    Culture   Final    Consistent with normal respiratory flora. Performed at [facility], [place] [address], [place], [place] [address]    Report Status 10/15/2017 FINAL  Final     Studies: Dg Chest Port 1 View  Result Date: 10/16/2017 CLINICAL DATA:  Ventilated patient. History of congestive heart failure, COPD and CAD. EXAM: PORTABLE CHEST 1 VIEW COMPARISON:  10/15/2017 FINDINGS: Endotracheal tube and enteric catheter in stable position. Stable enlargement of the cardiac silhouette. Low lung volumes with mild interstitial opacities. Bilateral pleural effusions with bibasilar atelectasis. Osseous structures are without acute abnormality. Soft tissues are grossly normal. IMPRESSION: Enlarged cardiac silhouette with mild interstitial pulmonary edema. Bilateral pleural effusions with bibasilar atelectasis. Electronically Signed   By: Fidela Salisbury  M.D.   On: 10/16/2017 11:25   Dg Chest Port 1 View  Result Date: 10/15/2017 CLINICAL DATA:  Followup ventilated patient. EXAM: PORTABLE CHEST 1 VIEW COMPARISON:  10/13/2017 and older studies. FINDINGS: Endotracheal tube and nasal/orogastric tube are stable in well positioned. There is persistent opacity extending from the mid lungs to the lung bases, with the opacities more confluent and obscures hemidiaphragms. The opacities consistent with moderate bilateral pleural effusions with associated atelectasis. No convincing pulmonary edema. No pneumothorax. IMPRESSION: 1. Improved lung aeration since the most recent prior exam. Pulmonary vasculature appears normal and there is no significant interstitial thickening. No residual pulmonary edema. 2. Mid to lower lung opacity is consistent with moderate pleural effusions with associated atelectasis. This is stable from the most recent prior study Electronically Signed   By: Lajean Manes M.D.   On: 10/15/2017 16:27   US Scrotum W/doppler  Result Date: 10/15/2017 CLINICAL DATA:  Testicular swelling. EXAM: SCROTAL ULTRASOUND DOPPLER ULTRASOUND OF THE TESTICLES TECHNIQUE: Complete ultrasound examination of the testicles, epididymis, and other scrotal structures was performed. Color and spectral Doppler ultrasound were also utilized to evaluate blood flow to the testicles. COMPARISON:  None. FINDINGS: Right testicle Measurements: 5.2 x 1.9 x 1.9 cm. No mass or microlithiasis visualized. Left testicle Measurements: 5.6 x 2.4 x 2 cm. No mass or microlithiasis visualized. Right epididymis:  Limited evaluation. Left epididymis:  Limited evaluation. Hydrocele:  There are very large bilateral hydroceles. Varicocele:  Limited evaluation. Pulsed Doppler interrogation of both testes demonstrates normal low resistance arterial and venous waveforms bilaterally. IMPRESSION: Bilateral testis are normal. There is no evidence of testicular torsion. There are bilateral large  hydroceles. Electronically Signed   By: Mallie Darting.D.  On: 10/15/2017 20:21    Scheduled Meds: . ALPRAZolam  0.5 mg Oral TID  . chlorhexidine gluconate (MEDLINE KIT)  15 mL Mouth Rinse BID  . famotidine  20 mg Per Tube BID  . feeding supplement (PRO-STAT SUGAR FREE 64)  60 mL Per Tube QID  . feeding supplement (VITAL HIGH PROTEIN)  1,000 mL Per Tube Q24H  . free water  200 mL Per Tube Q4H  . furosemide  40 mg Intravenous BID  . heparin  5,000 Units Subcutaneous Q8H  . Influenza vac split quadrivalent PF  0.5 mL Intramuscular Tomorrow-1000  . insulin aspart  0-20 Units Subcutaneous Q4H  . insulin detemir  10 Units Subcutaneous BID  . mouth rinse  15 mL Mouth Rinse 10 times per day  . multivitamin  15 mL Per Tube Daily  . polyethylene glycol  17 g Per Tube Daily  . senna-docusate  2 tablet Per Tube BID   Continuous Infusions: . sodium chloride Stopped (10/15/17 0428)  . dexmedetomidine (PRECEDEX) IV infusion Stopped (10/14/17 1234)  . fentaNYL infusion INTRAVENOUS 350 mcg/hr (10/16/17 1200)  . meropenem (MERREM) IV Stopped (10/16/17 8592)  . propofol (DIPRIVAN) infusion 30 mcg/kg/min (10/16/17 1200)  . valproate sodium 52.5 mL/hr at 10/16/17 1200    Assessment/Plan:  1. Cardiac arrest.  Patient underwent hypothermia protocol.  Continue supportive care.  MRI shows old stroke neurology following,   2. Acute hypoxic respiratory failure.  Continue ventilator support.  Continues to require 30% FiO2 intubation per pulmonary critical care will need trach 3. left lower lobe pneumonia, right lower extremity ulceration with foul-smelling discharge, left first toe ulceration and swelling of the first toe likely chronic infection.  Antibiotics per the ICU team hypotension.  Off pressors 4. Acute kidney injury and hyperkalemia.   This has improved. 5. History of chronic diastolic congestive heart failure 6. Overall prognosis poor and high risk for cardiopulmonary arrest.  Patient listed as  full code. 7. Anemia.  Responded to transfusion.    Code Status:     Code Status Orders  (From admission, onward)         Start     Ordered   10/07/17 0132  Full code  Continuous     10/07/17 0131        Code Status History    Date Active Date Inactive Code Status Order ID Comments User Context   10/07/2017 0031 10/07/2017 0131 Full Code [id number]  Awilda Bill, NP ED   06/17/2017 1856 06/23/2017 2244 Full Code [id number]  Nicholes Mango, MD Inpatient   02/18/2017 1623 02/18/2017 2133 Full Code [id number]  Delana Meyer, Dolores Lory, MD Inpatient   02/11/2017 1138 02/11/2017 1736 Full Code [id number]  Schnier, Dolores Lory, MD Inpatient   10/24/2014 1934 10/31/2014 1837 Full Code [id number]  Fritzi Mandes, MD Inpatient   10/11/2014 2347 10/17/2014 1844 Full Code [id number]  Lance Coon, MD Inpatient     Family Communication: As per critical care team Disposition Plan: To be determined  Consultants:  Critical care specialist  Antibiotics:  Cefepime  Time spent: 35 minutes [place]

## 2017-10-16 NOTE — Consult Note (Signed)
Billy Liu, Billy Liu [id number] [birth year] [place], MD  Reason for Consult: Tracheostomy tube placement  HPI: [name] with history of alzheimer's Billy Liu s/p cardiac arrest in skilled nursing facility.  Failure to wean from vent and difficult intubation upon reintubation.  Patient also has difficulty tolerating secretions when extubated.  Asked to evaluate for tracheostomy tube placement.  Patient also has MRSA from ulcer on toe.  Allergies:  Allergies  Allergen Reactions  . Penicillins Other (See Comments)    Per MAR Has patient had a PCN reaction causing immediate rash, facial/tongue/throat swelling, SOB or lightheadedness with hypotension: Unknown Has patient had a PCN reaction causing severe rash involving mucus membranes or skin necrosis: Unknown Has patient had a PCN reaction that required hospitalization: Unknown Has patient had a PCN reaction occurring within the last 10 years: Unknown If all of the above answers are "NO", then may proceed with Cephalosporin use.     ROS: Review of systems normal other than 12 systems except per HPI.  PMH:  Past Medical History:  Diagnosis Date  . Anxiety   . Cellulitis 10/17/2014   left lower leg  . Chronic diastolic CHF (congestive heart failure) (HCC)   . CKD (chronic kidney disease), stage III (HCC)   . COPD (chronic obstructive pulmonary disease) (HCC)   . Coronary artery disease   . Billy Liu (HCC)    Alzheimer's   . Depression   . Diabetes mellitus without complication (HCC)   . GERD (gastroesophageal reflux disease)   . Hyperlipemia   . Hypertension   . Obesity   . Renal disorder     FH:  Family History  Problem Relation Age of Onset  . Cancer Brother     SH:  Social History   Socioeconomic History  . Marital status: Married    Spouse name: Not on file  . Number of children: Not on file  . Years of education: Not on file  . Highest education level: Not on file  Occupational History  . Not on file   Social Needs  . Financial resource strain: Not on file  . Food insecurity:    Worry: Not on file    Inability: Not on file  . Transportation needs:    Medical: Not on file    Non-medical: Not on file  Tobacco Use  . Smoking status: Never Smoker  . Smokeless tobacco: Former Neurosurgeon    Types: Chew  Substance and Sexual Activity  . Alcohol use: No  . Drug use: No  . Sexual activity: Not on file  Lifestyle  . Physical activity:    Days per week: Not on file    Minutes per session: Not on file  . Stress: Not on file  Relationships  . Social connections:    Talks on phone: Not on file    Gets together: Not on file    Attends religious service: Not on file    Active member of club or organization: Not on file    Attends meetings of clubs or organizations: Not on file    Relationship status: Not on file  . Intimate partner violence:    Fear of current or ex partner: Not on file    Emotionally abused: Not on file    Physically abused: Not on file    Forced sexual activity: Not on file  Other Topics Concern  . Not on file  Social History Narrative  . Not on file    PSH:  Past Surgical  History:  Procedure Laterality Date  . COLONOSCOPY N/A 06/20/2017   Procedure: COLONOSCOPY;  Surgeon: Toney Reil, MD;  Location[facility] ENDOSCOPY;  Service: Gastroenterology;  Laterality: N/A;  . ESOPHAGOGASTRODUODENOSCOPY N/A 06/20/2017   Procedure: ESOPHAGOGASTRODUODENOSCOPY (EGD);  Surgeon: Toney Reil, MD;  Location: [facility] ENDOSCOPY;  Service: Gastroenterology;  Laterality: N/A;  . LOWER EXTREMITY ANGIOGRAPHY Left 02/11/2017   Procedure: LOWER EXTREMITY ANGIOGRAPHY;  Surgeon: Renford Dills, MD;  Location: [facility] INVASIVE CV LAB;  Service: Cardiovascular;  Laterality: Left;  . LOWER EXTREMITY ANGIOGRAPHY Left 03/11/2017   Procedure: LOWER EXTREMITY ANGIOGRAPHY;  Surgeon: Renford Dills, MD;  Location: [facility] INVASIVE CV LAB;  Service: Cardiovascular;  Laterality: Left;  . LOWER  EXTREMITY INTERVENTION  03/11/2017   Procedure: LOWER EXTREMITY INTERVENTION;  Surgeon: Renford Dills, MD;  Location: [facility] INVASIVE CV LAB;  Service: Cardiovascular;;  . PERIPHERAL VASCULAR ATHERECTOMY Left 02/18/2017   Procedure: PERIPHERAL VASCULAR ATHERECTOMY;  Surgeon: Renford Dills, MD;  Location: [facility] INVASIVE CV LAB;  Service: Cardiovascular;  Laterality: Left;    Physical  Exam:  GEN-  Sedated and intubated EARS-  External ears clear NOSE-  Clear anteriorly OC/OP-  ETT 7.5cm in place and secure NECK-  Very limited neck extension, minimal landmarks palpated RESP-  On vent CARD-  RRR   A/P: Billy Liu s/p cardiac arrest with respiratory failure  Plan:  Patient is certainly a tracheostomy tube candidate but will most likely never be decannulated given cardiac arrest as well as Billy Liu requiring skilled nursing prior to admission.  Strongly recommend discussion with family with Palliative care prior to proceeding with tracheostomy.  I will also be happy to discuss with family to ensure they understand risks and benefits of tracheostomy tube placement.  Patient will also be difficult due to no neck extension.  Will plan on finding time hopefully early next week.  Consider CT scan of neck with contrast depending on view during reintubation of mass vs secretions on right hypopharynx on CT of cervical spine.   [name] 10/16/2017 9:20 AM

## 2017-10-16 NOTE — Progress Notes (Signed)
SUBJECTIVE: intubated, opens eyes slightly to greeting.    Vitals:   10/16/17 0700 10/16/17 0800 10/16/17 0900 10/16/17 1000  BP: (!) 129/47 (!) 144/64 (!) 134/48 (!) 128/47  Pulse: 83 86 81 76  Resp: 16 16 16 16   Temp: 98.6 F (37 C)     TempSrc: Axillary     SpO2: 95% 96% 97% 94%  Weight:      Height:        Intake/Output Summary (Last 24 hours) at 10/16/2017 1048 Last data filed at 10/16/2017 1000 Gross per 24 hour  Intake 3179.09 ml  Output 1150 ml  Net 2029.09 ml    LABS: Basic Metabolic Panel: Recent Labs    10/15/17 0435 10/16/17 0304  NA 150* 149*  K 4.5 4.3  CL 115* 115*  CO2 29 30  GLUCOSE 164* 151*  BUN 45* 44*  CREATININE 0.93 0.85  CALCIUM 8.9 8.7*   Liver Function Tests: Recent Labs    10/16/17 0715  ALBUMIN 2.4*   No results for input(s): LIPASE, AMYLASE in the last 72 hours. CBC: Recent Labs    10/15/17 0435 10/16/17 0304  WBC 6.2 5.0  NEUTROABS  --  3.4  HGB 8.8* 7.7*  HCT 30.7* 26.7*  MCV 90.6 90.2  PLT 234 222   Cardiac Enzymes: No results for input(s): CKTOTAL, CKMB, CKMBINDEX, TROPONINI in the last 72 hours. BNP: Invalid input(s): POCBNP D-Dimer: No results for input(s): DDIMER in the last 72 hours. Hemoglobin A1C: No results for input(s): HGBA1C in the last 72 hours. Fasting Lipid Panel: No results for input(s): CHOL, HDL, LDLCALC, TRIG, CHOLHDL, LDLDIRECT in the last 72 hours. Thyroid Function Tests: No results for input(s): TSH, T4TOTAL, T3FREE, THYROIDAB in the last 72 hours.  Invalid input(s): FREET3 Anemia Panel: No results for input(s): VITAMINB12, FOLATE, FERRITIN, TIBC, IRON, RETICCTPCT in the last 72 hours.   PHYSICAL EXAM General: Critically ill elderly gentleman, intubated. HEENT:  Normocephalic and atramatic Neck:  No JVD.  Lungs: Diminished, congested bilaterally.[name] Heart: HRRR . Normal S1 and S2 without gallops or murmurs.  Abdomen: Bowel sounds are positive, abdomen soft and non-tender  Msk:  Back  normal, normal gait. Normal strength and tone for age. Extremities: 2-3+ non pitting edema bilaterally..   Neuro: Intubated, minimally responsive..  TELEMETRY: NSR 72bpm  ASSESSMENT AND PLAN: ASSESSMENT AND PLAN: Status post cardiac arrest with pneumonia and respiratory failure patient remains intubated and unresponsive. Failed self extubation x 2, may require trach. Hemodynamically stable at the moment, in normal sinus rhythm.  Active Problems:   Cardiac arrest [facility])    Caroleen Hamman, NP-C 10/16/2017 10:48 AM Cell: [phone]

## 2017-10-16 NOTE — Progress Notes (Signed)
Follow up - Critical Care Medicine Note  Patient Details:    Billy Liu is an 84 y.o. male.  With past medical history remarkable for hypertension, hyperlipidemia, gastroesophageal reflux disease, depression, Alzheimer's, coronary artery disease, COPD, stage III renal failure, chronic diastolic heart failure,  admitted with acute on chronic renal failure,  status post cardiac arrest, mechanically intubated, hypothermic protocol initiated @36  degrees C.   Lines, Airways, Drains: Airway 7.5 mm (Active)  Secured at (cm) 24 cm 10/10/2017  8:15 AM  Measured From Lips 10/10/2017  8:15 AM  Secured Location Left 10/10/2017  8:15 AM  Secured By [employer] 10/10/2017  8:15 AM  Tube Holder Repositioned Yes 10/10/2017  8:15 AM  Cuff Pressure (cm H2O) 28 cm H2O 10/10/2017  8:15 AM  Site Condition Dry 10/10/2017  8:15 AM     NG/OG Tube Orogastric Left mouth Xray Documented cm marking at nare/ corner of mouth 76 cm (Active)  Cm Marking at Nare/Corner of Mouth (if applicable) 70 cm 10/10/2017  8:00 AM  Site Assessment Clean;Dry;Intact 10/10/2017  8:00 AM  Ongoing Placement Verification No change in cm markings or external length of tube from initial placement;No acute changes, not attributed to clinical condition;No change in respiratory status 10/10/2017  8:00 AM  Status Infusing tube feed 10/10/2017  8:00 AM  Intake (mL) 120 mL 10/08/2017  6:14 PM     Urethral Catheter Urology MD Coude 16 Fr. (Active)  Indication for Insertion or Continuance of Catheter Unstable critical patients (first 24-48 hours);Bladder outlet obstruction / other urologic reason 10/10/2017  8:00 AM  Site Assessment Intact;Swelling;Edema 10/08/2017  7:45 AM  Catheter Maintenance Bag below level of bladder;Catheter secured;Drainage bag/tubing not touching floor;Insertion date on drainage bag;No dependent loops;Seal intact 10/10/2017  8:00 AM  Collection Container Standard drainage bag 10/10/2017  8:00 AM  Securement Method Tape  10/10/2017  8:00 AM  Urinary Catheter Interventions Unclamped 10/10/2017  8:00 AM  Output (mL) 1100 mL 10/10/2017  6:01 AM    Anti-infectives:  Anti-infectives (From admission, onward)   Start     Dose/Rate Route Frequency Ordered Stop   10/15/17 2000  vancomycin (VANCOCIN) 1,500 mg in sodium chloride 0.9 % 500 mL IVPB     1,500 mg 250 mL/hr over 120 Minutes Intravenous Every 12 hours 10/14/17 1516     10/13/17 2300  vancomycin (VANCOCIN) 1,250 mg in sodium chloride 0.9 % 250 mL IVPB  Status:  Discontinued     1,250 mg 166.7 mL/hr over 90 Minutes Intravenous Every 8 hours 10/13/17 1721 10/14/17 1516   10/13/17 2100  vancomycin (VANCOCIN) 1,250 mg in sodium chloride 0.9 % 250 mL IVPB  Status:  Discontinued     1,250 mg 166.7 mL/hr over 90 Minutes Intravenous Every 8 hours 10/13/17 1333 10/13/17 1721   10/13/17 1430  vancomycin (VANCOCIN) 1,250 mg in sodium chloride 0.9 % 250 mL IVPB     1,250 mg 166.7 mL/hr over 90 Minutes Intravenous  Once 10/13/17 1333 10/13/17 1803   10/13/17 1430  meropenem (MERREM) 1 g in sodium chloride 0.9 % 100 mL IVPB     1 g 200 mL/hr over 30 Minutes Intravenous Every 8 hours 10/13/17 1333     10/10/17 2200  ceFEPIme (MAXIPIME) 2 g in sodium chloride 0.9 % 100 mL IVPB  Status:  Discontinued     2 g 200 mL/hr over 30 Minutes Intravenous Every 12 hours 10/10/17 1557 10/12/17 0845   10/07/17 0600  ceFEPIme (MAXIPIME) 2 g in sodium chloride  0.9 % 100 mL IVPB  Status:  Discontinued     2 g 200 mL/hr over 30 Minutes Intravenous Every 24 hours 10/07/17 0050 10/07/17 0210   10/07/17 0600  vancomycin (VANCOCIN) 1,500 mg in sodium chloride 0.9 % 500 mL IVPB  Status:  Discontinued     1,500 mg 250 mL/hr over 120 Minutes Intravenous Every 24 hours 10/07/17 0050 10/07/17 1136   10/07/17 0600  ceFEPIme (MAXIPIME) 2 g in sodium chloride 0.9 % 100 mL IVPB  Status:  Discontinued     2 g 200 mL/hr over 30 Minutes Intravenous Every 12 hours 10/07/17 0210 10/09/17 1124    10/06/17 2130  ceFEPIme (MAXIPIME) 1 g in sodium chloride 0.9 % 100 mL IVPB     1 g 200 mL/hr over 30 Minutes Intravenous  Once 10/06/17 2126 10/07/17 0016   10/06/17 2130  vancomycin (VANCOCIN) IVPB 1000 mg/200 mL premix     1,000 mg 200 mL/hr over 60 Minutes Intravenous  Once 10/06/17 2126 10/07/17 0115      Microbiology: Results for orders placed or performed during the hospital encounter of 10/06/17  Blood culture (routine x 2)     Status: None   Collection Time: 10/06/17 10:43 PM  Result Value Ref Range Status   Specimen Description BLOOD LEFT HAND   Final   Special Requests   Final    BOTTLES DRAWN AEROBIC AND ANAEROBIC Blood Culture adequate volume   Culture   Final    NO GROWTH 5 DAYS Performed at [facility], [address] [place], [place] [address]    Report Status 10/11/2017 FINAL  Final  Blood culture (routine x 2)     Status: None   Collection Time: 10/06/17 10:44 PM  Result Value Ref Range Status   Specimen Description BLOOD BLOOD LEFT FOREARM  Final   Special Requests   Final    BOTTLES DRAWN AEROBIC AND ANAEROBIC Blood Culture adequate volume   Culture   Final    NO GROWTH 5 DAYS Performed at [facility], [address] [place], [place] [address]    Report Status 10/11/2017 FINAL  Final  MRSA PCR Screening     Status: None   Collection Time: 10/07/17  1:07 AM  Result Value Ref Range Status   MRSA by PCR NEGATIVE NEGATIVE Final    Comment:        The GeneXpert MRSA Assay (FDA approved for NASAL specimens only), is one component of a comprehensive MRSA colonization surveillance program. It is not intended to diagnose MRSA infection nor to guide or monitor treatment for MRSA infections. Performed at [facility], [address] [place], [place] [address]   Urine Culture     Status: None   Collection Time: 10/07/17  2:24 AM  Result Value Ref Range Status   Specimen Description   Final    URINE, RANDOM Performed at  [facility], [address] [place], [place] [address]    Special Requests   Final    NONE Performed at [facility], [address], [place], [place] [address]    Culture   Final    NO GROWTH Performed at [facility], [address] [place], [place] [address]    Report Status 10/08/2017 FINAL  Final  Aerobic/Anaerobic Culture (surgical/deep wound)     Status: None   Collection Time: 10/07/17  2:24 AM  Result Value Ref Range Status   Specimen Description TOE LEFT  Final  Special Requests WOUND  Final   Gram Stain   Final    RARE SQUAMOUS EPITHELIAL CELLS PRESENT NO WBC SEEN MODERATE GRAM POSITIVE RODS FEW GRAM POSITIVE COCCI RARE GRAM NEGATIVE RODS    Culture   Final    FEW METHICILLIN RESISTANT STAPHYLOCOCCUS AUREUS WITHIN MIXED ORGANISMS NO ANAEROBES ISOLATED Performed at [facility], [address] [place], [place] [address]    Report Status 10/12/2017 FINAL  Final   Organism ID, Bacteria METHICILLIN RESISTANT STAPHYLOCOCCUS AUREUS  Final      Susceptibility   Methicillin resistant staphylococcus aureus - MIC*    CIPROFLOXACIN <=0.5 SENSITIVE Sensitive     ERYTHROMYCIN >=8 RESISTANT Resistant     GENTAMICIN <=0.5 SENSITIVE Sensitive     OXACILLIN >=4 RESISTANT Resistant     TETRACYCLINE <=1 SENSITIVE Sensitive     VANCOMYCIN 1 SENSITIVE Sensitive     TRIMETH/SULFA <=10 SENSITIVE Sensitive     CLINDAMYCIN <=0.25 SENSITIVE Sensitive     RIFAMPIN <=0.5 SENSITIVE Sensitive     Inducible Clindamycin NEGATIVE Sensitive     * FEW METHICILLIN RESISTANT STAPHYLOCOCCUS AUREUS  Aerobic/Anaerobic Culture (surgical/deep wound)     Status: Abnormal   Collection Time: 10/07/17  2:24 AM  Result Value Ref Range Status   Specimen Description   Final    LEG RIGHT Performed at [facility], [address] [place], [place] [address]    Special Requests   Final    NONE Performed at [facility], [address]  [place], [place] [address]    Gram Stain   Final    NO WBC SEEN MODERATE GRAM NEGATIVE RODS FEW GRAM POSITIVE RODS RARE GRAM POSITIVE COCCI Performed at [facility], [address] [place], [place] [address]    Culture (A)  Final    MULTIPLE ORGANISMS PRESENT, NONE PREDOMINANT MIXED ANAEROBIC FLORA PRESENT.  CALL LAB IF FURTHER IID REQUIRED.    Report Status 10/12/2017 FINAL  Final  Culture, respiratory (non-expectorated)     Status: None   Collection Time: 10/13/17 12:50 PM  Result Value Ref Range Status   Specimen Description   Final    TRACHEAL ASPIRATE Performed at [facility], [address] [place], [place] [address]    Special Requests   Final    NONE Performed at [facility], [address] [place], [place] [address]    Gram Stain   Final    FEW WBC PRESENT, PREDOMINANTLY PMN MODERATE GRAM POSITIVE RODS FEW GRAM VARIABLE ROD FEW GRAM NEGATIVE RODS RARE GRAM POSITIVE COCCI    Culture   Final    Consistent with normal respiratory flora. Performed at [facility], [address] [place], [place] [address]    Report Status 10/15/2017 FINAL  Final   Studies: Dg Chest 1 View  Result Date: 10/07/2017 CLINICAL DATA:  Feeding tube, evaluate placement of endotracheal tube EXAM: CHEST  1 VIEW COMPARISON:  Portable chest x-ray of 10/06/2016 FINDINGS: The tip of the endotracheal tube is approximately 3.9 cm above the carina. The lungs remain poorly aerated and cardiomegaly is stable. Haziness at both lung bases may reflect atelectasis and effusion although pneumonia cannot be excluded. NG tube extends below the hemidiaphragm. IMPRESSION: 1. Endotracheal tube tip 3.9 cm above the carina. 2. Poor aeration with basilar volume loss, probable effusions, and pneumonia cannot be excluded. 3. Stable cardiomegaly. 4. NG tube extends below the hemidiaphragm. Electronically Signed   By: Dwyane Dee  M.D.   On: 10/07/2017 14:03   Dg Abd 1 View  Result Date:  10/07/2017 CLINICAL DATA:  Evaluate position of feeding tube EXAM: ABDOMEN - 1 VIEW COMPARISON:  Abdomen 10/07/2016 FINDINGS: A tip of the feeding tube is seen within the fundus dex proximal body of the stomach. Bowel gas pattern is nonspecific. Some volume loss is again noted at the lung bases and cardiomegaly is stable IMPRESSION: Feeding tube tip extends to the region of the fundus-proximal stomach. The bowel gas pattern is nonspecific Electronically Signed   By: Dwyane Dee M.D.   On: 10/07/2017 14:04   Dg Abd 1 View  Result Date: 10/07/2017 CLINICAL DATA:  84 year old male with nasogastric tube placement. Subsequent encounter. EXAM: ABDOMEN - 1 VIEW COMPARISON:  10/07/2017 2:36 a.m. FINDINGS: Nasogastric tube radiopaque marker can only be well delineated to the level of the distal esophagus. Tip of the nasogastric tube may enter the stomach but is not adequately assessed secondary to overlying leads in the left upper quadrant. Removal of leads or change of lead position and repeat exam may prove helpful for further delineation. Nonspecific bowel gas pattern. IMPRESSION: Nasogastric tube radiopaque marker can only be well delineated to the level of the distal esophagus. Tip of the nasogastric tube may enter the stomach but is not adequately assessed secondary to overlying leads in the left upper quadrant. Removal of leads or change of lead position and repeat exam may prove helpful for further delineation. These results will be called to the ordering clinician or representative by the Radiologist Assistant, and communication documented in the PACS or zVision Dashboard. Electronically Signed   By: Lacy Duverney M.D.   On: 10/07/2017 08:32   Dg Abd 1 View  Result Date: 10/07/2017 CLINICAL DATA:  OG tube placement, second attempt EXAM: ABDOMEN - 1 VIEW COMPARISON:  10/07/2017 at 0228 hours FINDINGS: Enteric tube terminates in the gastric cardia with its side port at/just above the GE junction. IMPRESSION:  Enteric tube terminates in the gastric cardia with its side port at/just above the GE junction. Electronically Signed   By: Charline Bills M.D.   On: 10/07/2017 02:48   Dg Abd 1 View  Result Date: 10/07/2017 CLINICAL DATA:  OG tube placement EXAM: ABDOMEN - 1 VIEW COMPARISON:  10/06/2017 FINDINGS: Enteric tube terminates at the GE junction with the side port in the distal esophagus. IMPRESSION: Enteric tube terminates at the GE junction with the side port in the distal esophagus. Electronically Signed   By: Charline Bills M.D.   On: 10/07/2017 02:47   Dg Abd 1 View  Result Date: 10/06/2017 CLINICAL DATA:  OG tube EXAM: ABDOMEN - 1 VIEW COMPARISON:  None. FINDINGS: By history patient has an OG tube. Just to the RIGHT of the thoracic spine, there is a radiopaque line, possibly an orogastric tube. However this is slightly to the RIGHT of the expected course of the esophagus and does not extend into the LEFT UPPER QUADRANT. It is possible that orogastric tube has been withdrawn or removed and not included on the film. There is dense opacification of the LEFT lung base. Visualized bowel gas pattern is nonobstructive. IMPRESSION: Possible orogastric tube to the distal esophagus, but this appears slightly to the RIGHT of the expected course of the esophagus. Recommend confirmation of orogastric tube placement and consider advancing tube further into the stomach if this is felt to be the orogastric tube. Significant LEFT LOWER lobe opacity. Electronically Signed   By: Norva Pavlov M.D.  On: 10/06/2017 21:00   Ct Head Wo Contrast  Result Date: 10/09/2017 CLINICAL DATA:  Question anoxic brain injury.  Cardiac arrest EXAM: CT HEAD WITHOUT CONTRAST TECHNIQUE: Contiguous axial images were obtained from the base of the skull through the vertex without intravenous contrast. 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 Small remote lateral left frontal  infarct best seen on reformats. There is generalized atrophy. Small remote left cerebellar infarct. No detected acute infarct or swelling. No hemorrhage, hydrocephalus, or collection. Vascular: Atherosclerotic calcification.  No hyperdense vessel. Skull: Remote left frontal craniotomy.  No acute finding Sinuses/Orbits: No acute finding IMPRESSION: 1. No evident anoxic injury. 2. 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 Electronically Signed   By: Marnee Spring M.D.   On: 10/09/2017 14:46   Ct Head Wo Contrast  Result Date: 10/06/2017 CLINICAL DATA:  Altered mental status (AMS), unclear cause; C-spine trauma, high clinical risk (NEXUS/CCR). Collapsed in the bathroom. EXAM: CT HEAD WITHOUT CONTRAST CT CERVICAL SPINE WITHOUT CONTRAST TECHNIQUE: Multidetector CT imaging of the head and cervical spine was performed following the standard protocol without intravenous contrast. Multiplanar CT image reconstructions of the cervical spine were also generated. COMPARISON:  Head CT 06/17/2017 FINDINGS: CT HEAD FINDINGS Brain: Unchanged degree of atrophy and chronic small vessel ischemia. Encephalomalacia in the left parietal lobe again seen. No intracranial hemorrhage, mass effect, or midline shift. No hydrocephalus. The basilar cisterns are patent. No evidence of territorial infarct or acute ischemia. No extra-axial or intracranial fluid collection. Vascular: Atherosclerosis of skullbase vasculature without hyperdense vessel or abnormal calcification. Skull: Prior left frontoparietal craniotomy.  No skull fracture. Sinuses/Orbits: Paranasal sinuses and mastoid air cells are clear. The visualized orbits are unremarkable. Bilateral cataract resection. Other: None. CT CERVICAL SPINE FINDINGS Alignment: Straightening of normal cervical lordosis. Skull base and vertebrae: No acute fracture. Bulky anterior osteophytes from C3-C4 through C6-C7, well corticated lucency through the osteophytes at C4 and C5 may represent  remote prior trauma. The dens and skull base are intact. Soft tissues and spinal canal: Debris in the hypopharynx limits assessment for prevertebral edema, no gross prevertebral soft tissue thickening. No evidence of canal hematoma. Disc levels: Near complete disc space loss at C2-C3 with bony ankylosis of the posterior elements on the left. Disc space narrowing at C5-C6 and C6-C7. 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 Bulky anterior osteophytes throughout, some of which are fragmented. Multilevel facet arthropathy. Upper chest: Moderate right pleural effusion, partially included. Other: Soft tissue prominence involving the right aspect of the hypopharynx is incompletely included in the field of view, causing mild leftward endotracheal and enteric tube deviation. There are dense vascular calcifications. IMPRESSION: 1.  No acute intracranial abnormality.  No skull fracture. 2. Unchanged atrophy, chronic small vessel ischemia, and left parietal encephalomalacia. 3. Advanced multilevel degenerative change in the cervical spine without evidence of acute fracture. 4. Soft tissue prominence involving the right hypopharynx is only partially included in the field of view, this may represent retained secretions or asymmetric positioning of the patient's tongue, however underlying mass lesion is not excluded. Consider contrast enhanced CT of the neck as clinically indicated. 5. Moderate right pleural effusion partially included in the field of view. Electronically Signed   By: Narda Rutherford M.D.   On: 10/06/2017 21:38   Ct Cervical Spine Wo Contrast  Result Date: 10/06/2017 CLINICAL DATA:  Altered mental status (AMS), unclear cause; C-spine trauma, high clinical risk (NEXUS/CCR). Collapsed in the bathroom. EXAM:  CT HEAD WITHOUT CONTRAST CT CERVICAL SPINE WITHOUT CONTRAST TECHNIQUE: Multidetector CT imaging of the head and cervical spine was performed following the standard protocol without  intravenous contrast. Multiplanar CT image reconstructions of the cervical spine were also generated. COMPARISON:  Head CT 06/17/2017 FINDINGS: CT HEAD FINDINGS Brain: Unchanged degree of atrophy and chronic small vessel ischemia. Encephalomalacia in the left parietal lobe again seen. No intracranial hemorrhage, mass effect, or midline shift. No hydrocephalus. The basilar cisterns are patent. No evidence of territorial infarct or acute ischemia. No extra-axial or intracranial fluid collection. Vascular: Atherosclerosis of skullbase vasculature without hyperdense vessel or abnormal calcification. Skull: Prior left frontoparietal craniotomy.  No skull fracture. Sinuses/Orbits: Paranasal sinuses and mastoid air cells are clear. The visualized orbits are unremarkable. Bilateral cataract resection. Other: None. CT CERVICAL SPINE FINDINGS Alignment: Straightening of normal cervical lordosis. Skull base and vertebrae: No acute fracture. Bulky anterior osteophytes from C3-C4 through C6-C7, well corticated lucency through the osteophytes at C4 and C5 may represent remote prior trauma. The dens and skull base are intact. Soft tissues and spinal canal: Debris in the hypopharynx limits assessment for prevertebral edema, no gross prevertebral soft tissue thickening. No evidence of canal hematoma. Disc levels: Near complete disc space loss at C2-C3 with bony ankylosis of the posterior elements on the left. Disc space narrowing at C5-C6 and C6-C7. 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 Bulky anterior osteophytes throughout, some of which are fragmented. Multilevel facet arthropathy. Upper chest: Moderate right pleural effusion, partially included. Other: Soft tissue prominence involving the right aspect of the hypopharynx is incompletely included in the field of view, causing mild leftward endotracheal and enteric tube deviation. There are dense vascular calcifications. IMPRESSION: 1.  No acute intracranial  abnormality.  No skull fracture. 2. Unchanged atrophy, chronic small vessel ischemia, and left parietal encephalomalacia. 3. Advanced multilevel degenerative change in the cervical spine without evidence of acute fracture. 4. Soft tissue prominence involving the right hypopharynx is only partially included in the field of view, this may represent retained secretions or asymmetric positioning of the patient's tongue, however underlying mass lesion is not excluded. Consider contrast enhanced CT of the neck as clinically indicated. 5. Moderate right pleural effusion partially included in the field of view. Electronically Signed   By: Narda Rutherford M.D.   On: 10/06/2017 21:38   Mr Brain Wo Contrast  Result Date: 10/10/2017 CLINICAL DATA:  Altered mental status. EXAM: MRI HEAD WITHOUT CONTRAST TECHNIQUE: Multiplanar, multiecho pulse sequences of the brain and surrounding structures were obtained without intravenous contrast. COMPARISON:  Head CT 10/09/2017 FINDINGS: The study is mildly motion degraded. Brain: There is no evidence of acute infarct, intracranial hemorrhage, mass, midline shift, or extra-axial fluid collection. Small chronic infarcts are again noted in the left parietal lobe and left cerebellum. There is also minimal cortical encephalomalacia laterally in the left frontal lobe subjacent to the craniotomy. Bilateral periventricular white matter T2 hyperintensities are nonspecific but compatible with mild chronic small vessel ischemic disease. There is mild-to-moderate cerebral atrophy. Vascular: Major intracranial vascular flow voids are preserved. Skull and upper cervical spine: Left frontoparietal craniotomy. Upper cervical disc degeneration. Sinuses/Orbits: Bilateral cataract extraction. Mild bilateral maxillary and sphenoid sinus mucosal thickening. Large bilateral mastoid effusions and fluid in the pharynx in the setting of intubation. Other: None. IMPRESSION: 1. No acute intracranial  abnormality. 2. Chronic left parietal, left frontal, and left cerebellar infarcts. 3. Mild chronic small vessel ischemia in the cerebral white matter. Electronically Signed   By:  Sebastian Ache M.D.   On: 10/10/2017 16:52   Dg Pelvis Portable  Result Date: 10/10/2017 CLINICAL DATA:  MRI screening for metal EXAM: PORTABLE PELVIS 1-2 VIEWS COMPARISON:  10/07/2017, 11/07/2009 radiographs FINDINGS: No metallic density radiopaque foreign body is seen within the included portions of the bony pelvis and overlying soft tissues. SI joints are non widened. There is a catheter or probe inferior to the pubic symphysis. No fracture or malalignment although right femoral neck is obscured by the trochanter. Phleboliths in the right pelvis. There may be chronic avulsion injury at the left ischium. IMPRESSION: Negative. Electronically Signed   By: Jasmine Pang M.D.   On: 10/10/2017 15:27   Dg Chest Port 1 View  Result Date: 10/15/2017 CLINICAL DATA:  Followup ventilated patient. EXAM: PORTABLE CHEST 1 VIEW COMPARISON:  10/13/2017 and older studies. FINDINGS: Endotracheal tube and nasal/orogastric tube are stable in well positioned. There is persistent opacity extending from the mid lungs to the lung bases, with the opacities more confluent and obscures hemidiaphragms. The opacities consistent with moderate bilateral pleural effusions with associated atelectasis. No convincing pulmonary edema. No pneumothorax. IMPRESSION: 1. Improved lung aeration since the most recent prior exam. Pulmonary vasculature appears normal and there is no significant interstitial thickening. No residual pulmonary edema. 2. Mid to lower lung opacity is consistent with moderate pleural effusions with associated atelectasis. This is stable from the most recent prior study Electronically Signed   By: Amie Portland M.D.   On: 10/15/2017 16:27   Dg Chest Port 1 View  Result Date: 10/13/2017 CLINICAL DATA:  Endotracheal and orogastric tubes. EXAM:  PORTABLE CHEST 1 VIEW COMPARISON:  Earlier today. FINDINGS: The endotracheal tube tip is unchanged, 3.9 cm above the carina. Orogastric tube extending into the stomach. Stable enlarged cardiac silhouette. Mild decrease in prominence of the pulmonary vasculature and interstitial markings. Poorly defined probable small bilateral pleural effusions. Interval linear density at the left lung base and poorly defined increased density with volume loss at the right lung base. IMPRESSION: 1. Mildly improved changes of congestive heart failure. 2. Interval bibasilar atelectasis. Electronically Signed   By: Beckie Salts M.D.   On: 10/13/2017 13:33   Dg Chest Port 1 View  Result Date: 10/13/2017 CLINICAL DATA:  On mechanically assisted ventilation, CHF, COPD EXAM: PORTABLE CHEST 1 VIEW COMPARISON:  Portable chest x-ray of 10/11/2017 and 10/09/2017 FINDINGS: The tip of the endotracheal tube is approximately 3.8 cm above the carina. Bilateral pleural effusions again are noted with some airspace disease most consistent with congestive heart failure and cardiomegaly is noted. NG tube extends below the hemidiaphragm. There are diffuse degenerative changes throughout the thoracic spine. IMPRESSION: 1. Little change in probable CHF with effusions, cardiomegaly and pulmonary edema. 2. Tip of endotracheal tube 3.8 cm above the carina. Electronically Signed   By: Dwyane Dee M.D.   On: 10/13/2017 08:52   Dg Chest Port 1 View  Result Date: 10/11/2017 CLINICAL DATA:  Ventilator EXAM: PORTABLE CHEST 1 VIEW COMPARISON:  10/09/2017 FINDINGS: Endotracheal tube is 9 cm above the carina. Cardiomegaly with vascular congestion. Bilateral perihilar lower lobe opacities and layering effusions are similar prior study. NG tube is in the stomach. IMPRESSION: Bilateral layering effusions with cardiomegaly and bilateral perihilar and lower lobe airspace opacities, likely edema and/or atelectasis. Electronically Signed   By: Charlett Nose M.D.   On:  10/11/2017 08:26   Dg Chest Port 1 View  Result Date: 10/09/2017 CLINICAL DATA:  Acute respiratory failure. EXAM: PORTABLE CHEST  1 VIEW COMPARISON:  Radiograph 10/07/2017 FINDINGS: Endotracheal tube 4.4 cm from the carina. Enteric tube in place with tip and side-port below the diaphragm. Cardiomegaly is unchanged. Worsening hazy opacities throughout both lungs. Bibasilar volume loss again seen with associated effusions. No pneumothorax. IMPRESSION: Worsening lung aeration with increasing hazy opacities throughout both lungs, likely combination of pleural fluid, atelectasis, and pulmonary edema. Electronically Signed   By: Narda Rutherford M.D.   On: 10/09/2017 04:06   Dg Chest Portable 1 View  Result Date: 10/06/2017 CLINICAL DATA:  Ett tube EXAM: PORTABLE CHEST 1 VIEW COMPARISON:  06/20/2017 FINDINGS: Interval placement of endotracheal tube, tip approximately 3.0 centimeters above the carina. An orogastric tube is in place, tip overlying the level of the LOWER esophagus but difficult to confirm. The heart is enlarged. There is dense opacity in the LEFT lung base. Perihilar opacities are consistent with pulmonary edema. IMPRESSION: Interval placement of endotracheal tube and orogastric tube. LEFT LOWER lobe opacity and pulmonary edema. Electronically Signed   By: Norva Pavlov M.D.   On: 10/06/2017 21:02   Dg Abd Portable 1v  Result Date: 10/13/2017 CLINICAL DATA:  Orogastric tube placement. EXAM: PORTABLE ABDOMEN - 1 VIEW COMPARISON:  Portable chest obtained at the same time. FINDINGS: Orogastric tube tip in the mid to distal stomach and side hole in the proximal to mid stomach. The included portion of the bowel gas pattern is normal. See the portable chest report for the chest findings. Lumbar and thoracic spine degenerative changes. IMPRESSION: Orogastric tube tip and side hole in the stomach. Electronically Signed   By: Beckie Salts M.D.   On: 10/13/2017 13:34   Dg Abd Portable 1v  Result  Date: 10/07/2017 CLINICAL DATA:  Orogastric tube placement. EXAM: PORTABLE ABDOMEN - 1 VIEW COMPARISON:  Radiograph of same day. FINDINGS: The bowel gas pattern is normal. Distal tip of nasogastric tube is seen in proximal stomach, with side hole at expected position of gastroesophageal junction. No radio-opaque calculi or other significant radiographic abnormality are seen. IMPRESSION: Distal tip of nasogastric tube seen in proximal stomach, with side hole at expected position of gastroesophageal junction. Advancement is recommended. Electronically Signed   By: Lupita Raider, M.D.   On: 10/07/2017 12:18   US Scrotum W/doppler  Result Date: 10/15/2017 CLINICAL DATA:  Testicular swelling. EXAM: SCROTAL ULTRASOUND DOPPLER ULTRASOUND OF THE TESTICLES TECHNIQUE: Complete ultrasound examination of the testicles, epididymis, and other scrotal structures was performed. Color and spectral Doppler ultrasound were also utilized to evaluate blood flow to the testicles. COMPARISON:  None. FINDINGS: Right testicle Measurements: 5.2 x 1.9 x 1.9 cm. No mass or microlithiasis visualized. Left testicle Measurements: 5.6 x 2.4 x 2 cm. No mass or microlithiasis visualized. Right epididymis:  Limited evaluation. Left epididymis:  Limited evaluation. Hydrocele:  There are very large bilateral hydroceles. Varicocele:  Limited evaluation. Pulsed Doppler interrogation of both testes demonstrates normal low resistance arterial and venous waveforms bilaterally. IMPRESSION: Bilateral testis are normal. There is no evidence of testicular torsion. There are bilateral large hydroceles. Electronically Signed   By: Sherian Rein M.D.   On: 10/15/2017 20:21    Consults: Treatment Team:  Pccm, Raymond Gurney, MD Laurier Nancy, MD Kym Groom, MD   Subjective:    Overnight Issues: No significant recurrence in the last 24 hours.  Patient seems to be a bit more responsive today.  Discussed with family members consultation for  tracheostomy placed  Objective:  Vital signs for last 24 hours: Temp:  [93.7  F (34.3 C)-101.7 F (38.7 C)] 98.6 F (37 C) (10/10 0700) Pulse Rate:  [53-86] 83 (10/10 0700) Resp:  [16] 16 (10/10 0700) BP: (99-163)/(37-67) 129/47 (10/10 0700) SpO2:  [92 %-100 %] 95 % (10/10 0700) FiO2 (%):  [30 %] 30 % (10/10 0410) Weight:  [128.3 kg] 128.3 kg (10/10 0500)  Hemodynamic parameters for last 24 hours:    Intake/Output from previous day: 10/09 0701 - 10/10 0700 In: 2182.7 [I.V.:1390; IV Piggyback:792.7] Out: 1625 [Urine:1625]  Intake/Output this shift: Total I/O In: -  Out: 125 [Urine:125]  Vent settings for last 24 hours: Vent Mode: PRVC FiO2 (%):  [30 %] 30 % Set Rate:  [16 bmp] 16 bmp Vt Set:  [500 mL] 500 mL PEEP:  [5 cmH20] 5 cmH20 Plateau Pressure:  [23 cmH20] 23 cmH20  Physical Exam:  Vital signs: Please see the above listed vital signs HEENT: Trachea midline, orally intubated, no accessory muscle utilization Cardiovascular: Regular rate and rhythm Abdominal: Positive bowel sounds, soft exam Extremities: No clubbing cyanosis, 1+ edema appreciated Neurologic: Patient is sedated, will track with eyes but no purposeful response  Assessment/Plan:   Status post cardiac arrest.  Patient appears a bit more interactive today, status post targeted temperature management, MRI CT scan a EEG please see reports  Hypoxemic/hypercapnic respiratory failure.  Patient had a failed self extubation required repeat intubation and bronchoscopy for mucopurulent pneumonia, on vancomycin and meropenem.  Patient now has had 2 unsuccessful extubations, will most likely require tracheostomy.  Discussed with family last night will consult ENT for tracheostomy  Renal failure.  Prerenal indices, BUN 44/creatinine 0.85  Anemia.  No evidence of active bleeding, has had a slow decrease in hemoglobin this morning at 7.7  Wound culture positive for staph with some methicillin-resistant  species  Scrotal swelling.  Ultrasound reveals bilateral large hydroceles  Anasarca, +3 L, will institute diuresis  Critical Care Total Time 30 minutes  Billy Liu 10/16/2017  *Care during the described time interval was provided by me and/or other providers on the critical care team.  I have reviewed this patient's available data, including medical history, events of note, physical examination and test results as part of my evaluation. Patient ID: Billy Liu, male   DOB: [birth year], 84 y.o.   MRN: [id number] Patient ID: Billy Liu, male   DOB: 05/12/1932, 84 y.o.   MRN: [id number] Patient ID: Billy Liu, male   DOB: 08/21/1932, 84 y.o.   MRN: [id number] Patient ID: Billy Liu, male   DOB: [birth year], 84 y.o.   MRN: [id number] Patient ID: Billy Liu, male   DOB: January 19, 1932, 84 y.o.   MRN: [id number] Patient ID: Billy Liu, male   DOB: [birth year], 84 y.o.   MRN: [id number]

## 2017-10-16 NOTE — Consult Note (Signed)
Pharmacy Electrolyte Monitoring Consult:  Pharmacy consulted to assist in monitoring and replacing electrolytes in this 84 y.o. male admitted on 10/06/2017 with Cardiac Arrest   Labs:  Sodium (mmol/L)  Date Value  10/16/2017 149 (H)  07/04/2013 133 (L)   Potassium (mmol/L)  Date Value  10/16/2017 4.3  07/04/2013 4.3   Magnesium (mg/dL)  Date Value  69/62/9528 2.7 (H)   Phosphorus (mg/dL)  Date Value  41/32/4401 4.4   Calcium (mg/dL)  Date Value  02/72/5366 8.7 (L)   Calcium, Total (mg/dL)  Date Value  44/03/4740 8.7   Albumin (g/dL)  Date Value  59/56/3875 2.4 (L)  07/04/2013 3.6    Assessment/Plan: 1. Electrolytes: no replacement warranted. Potassium was 4.3 this morning. Will monitor sodium; patient receiving 200 mL free water flushes q4h. Patient also received lasix 40 mg IV x 2.  2. Glucose mangagment: Levemir 10 units BID and SSI Q4hr. Patient receiving tube feeds. Glucose 141-151 over past 24 hours. Will continue to monitor and adjust per consult.   3. Constipation Management: Last BM 10/8. Continue daily Miralax and Senokot BID.   Pharmacy will continue to monitor and adjust per consult.   Mauri Reading, PharmD Pharmacy Resident  10/16/2017 11:58 AM

## 2017-10-16 NOTE — Progress Notes (Signed)
Unit Manager, Lawson Fiscal, from [facility] called requesting an update. Update given to [facility] regarding status.

## 2017-10-17 LAB — CBC
HEMATOCRIT: 27.4 % — AB (ref 39.0–52.0)
HEMOGLOBIN: 8 g/dL — AB (ref 13.0–17.0)
MCH: 26 pg (ref 26.0–34.0)
MCHC: 29.2 g/dL — AB (ref 30.0–36.0)
MCV: 89 fL (ref 80.0–100.0)
Platelets: 234 10*3/uL (ref 150–400)
RBC: 3.08 MIL/uL — ABNORMAL LOW (ref 4.22–5.81)
RDW: 17.5 % — ABNORMAL HIGH (ref 11.5–15.5)
WBC: 5.2 10*3/uL (ref 4.0–10.5)
nRBC: 0 % (ref 0.0–0.2)

## 2017-10-17 LAB — GLUCOSE, CAPILLARY
GLUCOSE-CAPILLARY: 143 mg/dL — AB (ref 70–99)
Glucose-Capillary: 139 mg/dL — ABNORMAL HIGH (ref 70–99)
Glucose-Capillary: 139 mg/dL — ABNORMAL HIGH (ref 70–99)
Glucose-Capillary: 118 mg/dL — ABNORMAL HIGH (ref 70–99)
Glucose-Capillary: 143 mg/dL — ABNORMAL HIGH (ref 70–99)
Glucose-Capillary: 148 mg/dL — ABNORMAL HIGH (ref 70–99)

## 2017-10-17 LAB — BASIC METABOLIC PANEL
Anion gap: 4 — ABNORMAL LOW (ref 5–15)
BUN: 46 mg/dL — AB (ref 8–23)
CHLORIDE: 112 mmol/L — AB (ref 98–111)
CO2: 31 mmol/L (ref 22–32)
Calcium: 8.6 mg/dL — ABNORMAL LOW (ref 8.9–10.3)
Creatinine, Ser: 0.88 mg/dL (ref 0.61–1.24)
GFR calc Af Amer: >60 mL/min (ref >60)
GFR calc non Af Amer: >60 mL/min (ref >60)
Glucose, Bld: 152 mg/dL — ABNORMAL HIGH (ref 70–99)
POTASSIUM: 4.1 mmol/L (ref 3.5–5.1)
SODIUM: 147 mmol/L — AB (ref 135–145)

## 2017-10-17 MED ORDER — BISACODYL 10 MG RE SUPP
10.0000 mg | Freq: Once | RECTAL | Status: AC
  Administered 2017-10-17: 10 mg via RECTAL
  Filled 2017-10-17: qty 1

## 2017-10-17 MED ORDER — VITAL HIGH PROTEIN PO LIQD: 1000.0000 mL | ORAL | Status: DC

## 2017-10-17 MED ORDER — PRO-STAT SUGAR FREE PO LIQD
60.0000 mL | Freq: Three times a day (TID) | ORAL | Status: DC
  Administered 2017-10-17 – 2017-10-21 (×12): 60 mL

## 2017-10-17 MED ORDER — VITAL HIGH PROTEIN PO LIQD
1000.0000 mL | ORAL | Status: DC
  Administered 2017-10-19: 1000 mL

## 2017-10-17 NOTE — Care Management (Signed)
Tracheotomy planned for Monday as family wishes to pursue aggressive care.  LTACH screen requested from both [employer] and [facility].

## 2017-10-17 NOTE — Progress Notes (Signed)
SUBJECTIVE: Intubated, no response to voice.   Vitals:   10/17/17 0600 10/17/17 0630 10/17/17 0700 10/17/17 0800  BP: (!) 144/53 (!) 144/54 (!) 144/52 (!) 152/56  Pulse: 73 74 72 70  Resp: 16 16 16 16   Temp:      TempSrc:      SpO2: 97% 97% 97% 96%  Weight:      Height:        Intake/Output Summary (Last 24 hours) at 10/17/2017 0844 Last data filed at 10/17/2017 0800 Gross per 24 hour  Intake 4840.62 ml  Output 4375 ml  Net 465.62 ml    LABS: Basic Metabolic Panel: Recent Labs    10/16/17 0304 10/17/17 0307  NA 149* 147*  K 4.3 4.1  CL 115* 112*  CO2 30 31  GLUCOSE 151* 152*  BUN 44* 46*  CREATININE 0.85 0.88  CALCIUM 8.7* 8.6*   Liver Function Tests: Recent Labs    10/16/17 0715  ALBUMIN 2.4*   No results for input(s): LIPASE, AMYLASE in the last 72 hours. CBC: Recent Labs    10/16/17 0304 10/17/17 0307  WBC 5.0 5.2  NEUTROABS 3.4  --   HGB 7.7* 8.0*  HCT 26.7* 27.4*  MCV 90.2 89.0  PLT 222 234   Cardiac Enzymes: No results for input(s): CKTOTAL, CKMB, CKMBINDEX, TROPONINI in the last 72 hours. BNP: Invalid input(s): POCBNP D-Dimer: No results for input(s): DDIMER in the last 72 hours. Hemoglobin A1C: No results for input(s): HGBA1C in the last 72 hours. Fasting Lipid Panel: No results for input(s): CHOL, HDL, LDLCALC, TRIG, CHOLHDL, LDLDIRECT in the last 72 hours. Thyroid Function Tests: No results for input(s): TSH, T4TOTAL, T3FREE, THYROIDAB in the last 72 hours.  Invalid input(s): FREET3 Anemia Panel: No results for input(s): VITAMINB12, FOLATE, FERRITIN, TIBC, IRON, RETICCTPCT in the last 72 hours.   PHYSICAL EXAM General: Critically ill elderly gentleman, intubated. HEENT:  Normocephalic and atramatic Neck:   No JVD.  Lungs: Diminished, congested bilaterally.[name] Heart: HRRR . Normal S1 and S2 without gallops or murmurs.  Abdomen: Bowel sounds are positive, abdomen soft and non-tender  Msk:  Back normal, normal gait. Normal  strength and tone for age. Extremities: 2-3+ non pitting edema bilaterally..   Neuro: Intubated, minimally responsive..  TELEMETRY: NSR 74bpm, ST depression noted.   ASSESSMENT AND PLAN: Status post cardiac arrest with pneumonia and respiratory failure patient remains intubated and unresponsive.Failed self extubation x 2, may require trach. Hemodynamically stableat the moment, in normal sinus rhythm.  Active Problems:   Cardiac arrest [facility])    Caroleen Hamman, NP-C 10/17/2017 8:44 AM Cell: [phone]

## 2017-10-17 NOTE — Progress Notes (Signed)
Nutrition Follow-up  DOCUMENTATION CODES:   Obesity unspecified  INTERVENTION:   Decrease Vital High Protein to 15 mL/hr  + Pro-Stat 60 mL TID.   Propofol: 31.1 ml/hr- provides 821kcal/day   Continue liquid MVI daily per tube.  Continue free water flush of 200 mL Q4hrs to maintain tube patency.  Provides 1781 kcal, 122 grams of protein, 1500 mL H2O daily.  NUTRITION DIAGNOSIS:   Inadequate oral intake related to inability to eat as evidenced by NPO status.  Ongoing - addressing with TF regimen.  GOAL:   Provide needs based on ASPEN/SCCM guidelines  Met with TF regimen.  MONITOR:   Vent status, Labs, Weight trends, TF tolerance, Skin, I & O's  REASON FOR ASSESSMENT:   Ventilator    ASSESSMENT:   84 year old male with PMHx of chronic diastolic CHF, COPD, CAD, DM, HTN, GERD, HLD, anxiety, depression, CKD stage III, dementia who is admitted from [facility] after cardiac arrest, intubated on 9/30, 36C TTM protocol initiated, with possible LLL PNA, pulmonary edema, acute on chronic renal failure.   Pt sedated and ventilated. Pt back on propofol; will adjust tube feeds. Per chart, pt is weight stable since admit.   Enteral Access: OGT placed 10/7; terminates in stomach per abdominal x-ray 10/7  MAP: >60 mmHg  Patient is currently intubated on ventilator support MV: 8.4 L/min Temp (24hrs), Avg:97.2 F (36.2 C), Min:94 F (34.4 C), Max:98.4 F (36.9 C)  Propofol: 31.1 ml/hr- provides 821kcal/day   Medications reviewed and include: Xanax 0.5 mg TID, famotidine, Novolog 0-20 units Q4hrs, Levemir 10 units BID, lasix, liquid MVI daily per tube, Miralax, senna-docusate, fentanyl, meropenem, valproate, propofol   Labs reviewed: Na 147(H), BUN 46(H) Hgb 8.0(L), Hct 27.4(L) cbgs- 126, 143, 139, 139, 139 x 24 hrs  I/O: 4500 mL UOP   MAP- >52mHg  Diet Order:   Diet Order            Diet NPO time specified  Diet effective now             EDUCATION NEEDS:    No education needs have been identified at this time  Skin:  Skin Assessment: Skin Integrity Issues:(cellulitis and DM ulcers to bilateral lower extremities)  Last BM:  10/11- type 6  Height:   Ht Readings from Last 1 Encounters:  10/07/17 _0  (1.905 m)    Weight:   Wt Readings from Last 1 Encounters:  10/17/17 131 kg    Ideal Body Weight:  89.1 kg  BMI:  Body mass index is 36.1 kg/m.  Estimated Nutritional Needs:   Kcal:  [address](11-14 kcal/kg)  Protein:  178 grams (2 grams/kg IBW)  Fluid:  1.5-1.8 L/day  CKoleen DistanceMS, RD, LDN Pager #- [phone]#- [phone] Hours Pager: [phone]

## 2017-10-17 NOTE — Consult Note (Signed)
Pharmacy Antibiotic Note  Billy Liu is a 84 y.o. male admitted on 10/06/2017 with cardiac arrest. Patient currently intubated with brief self-extubation on 10/7 and witnessed aspiration event. Patient with MRSA growing on left toe. Patient with allergy history significant for penicillin; no noted penicillin challenge in patient's medical history. Pharmacy has been consulted for Vancomycin + Meropenem dosing.  Plan: Continue Merrem 1 g every 8 hours x 7 days per discussion during ICU rounds.  There is potential for drug-drug interaction with Merrem + valproic acid - Merrem decreased VPA levels resulting in possible breakthrough seizures; however, patient takes VPA for behavioral per [facility] records.  Height: 6\' 3"  (190.5 cm) Weight: 288 lb 12.8 oz (131 kg) IBW/kg (Calculated) : 84.5  Temp (24hrs), Avg:97.2 F (36.2 C), Min:94 F (34.4 C), Max:98.4 F (36.9 C)  Recent Labs  Lab 10/11/17 0516  10/13/17 0904 10/14/17 0604 10/14/17 1430 10/15/17 0435 10/16/17 0304 10/16/17 0715 10/17/17 0307  WBC 9.5  --  6.1  --   --  6.2 5.0  --  5.2  CREATININE 0.95   < > 0.84 0.86  --  0.93 0.85  --  0.88  VANCOTROUGH  --   --   --   --  19  --   --  18  --    < > = values in this interval not displayed.    Estimated Creatinine Clearance: 92.8 mL/min (by C-G formula based on SCr of 0.88 mg/dL).    Allergies  Allergen Reactions  . Penicillins Other (See Comments)    Per MAR Has patient had a PCN reaction causing immediate rash, facial/tongue/throat swelling, SOB or lightheadedness with hypotension: Unknown Has patient had a PCN reaction causing severe rash involving mucus membranes or skin necrosis: Unknown Has patient had a PCN reaction that required hospitalization: Unknown Has patient had a PCN reaction occurring within the last 10 years: Unknown If all of the above answers are "NO", then may proceed with Cephalosporin use.     Antimicrobials this admission: Cefepime 0930 >>  10/6 Vancomycin 0930 >> 10/1, 10/7 >>10/10 Meropenem 10/7 >> 10/13  Dose Adjustments 10/8 Decreased to Vancomycin 1500 mg Q12H from Vancomycin 1250 mg every 8 hours.   Microbiology results: 0930 BCx: NG x 5 days 10/1 UCx: NG 10/1 Wound Cx toe - MRSA and resistance to erythromycin 10/1Wound Cx leg - multiple organisms; anaerobic 10/1 MRSA PCR: (-) 10/7 Resp Cx: few WBCx, moderate GPCs  Thank you for allowing pharmacy to be a part of this patient's care.  Pharmacy will continue to monitor.  Mauri Reading, PharmD Pharmacy Resident  10/17/2017 11:53 AM

## 2017-10-17 NOTE — Consult Note (Signed)
Pharmacy Electrolyte Monitoring Consult:  Pharmacy consulted to assist in monitoring and replacing electrolytes in this 84 y.o. male admitted on 10/06/2017 with Cardiac Arrest   Labs:  Sodium (mmol/L)  Date Value  10/17/2017 147 (H)  07/04/2013 133 (L)   Potassium (mmol/L)  Date Value  10/17/2017 4.1  07/04/2013 4.3   Magnesium (mg/dL)  Date Value  86/57/8469 2.7 (H)   Phosphorus (mg/dL)  Date Value  62/95/2841 4.4   Calcium (mg/dL)  Date Value  32/44/0102 8.6 (L)   Calcium, Total (mg/dL)  Date Value  72/53/6644 8.7   Albumin (g/dL)  Date Value  03/47/4259 2.4 (L)  07/04/2013 3.6    Assessment/Plan: 1. Electrolytes: no replacement warranted. Potassium was 4.1 this morning. Will monitor sodium; patient receiving 200 mL free water flushes q4h. Patient is also receiving lasix 40 mg IV BID (1 dose this morning).  2. Glucose mangagment: Levemir 10 units BID and SSI Q4hr. Patient receiving tube feeds. Glucose 139-152 over past 24 hours. Will continue to monitor and adjust per consult.   3. Constipation Management: Last BM 10/8. Since it has been more than 3 days, Bisacodyl 10 mg suppository was added. Continue daily Miralax and Senokot BID.   Pharmacy will continue to monitor and adjust per consult.   Broadus John, PharmD Candidate 10/17/2017 11:53 AM

## 2017-10-17 NOTE — Consult Note (Signed)
Name: Billy Liu MRN: [id number] DOB: 1932-05-19    ADMISSION DATE:  10/06/2017 CONSULTATION DATE: 10/06/2017  REFERRING MD : Dr. Duane Boston  CHIEF COMPLAINT: Cardiac Arrest   BRIEF PATIENT DESCRIPTION:  84 yo male admitted with acute on chronic renal failure post cardiac arrest mechanically intubated hypothermic protocol initiated _0  degrees C  SIGNIFICANT EVENTS/STUDIES:  09/30-Pt admitted to ICU post cardiac arrest mechanically intubated hypothermic protocol initiated by ER provider 09/30-CT Head and Cervical Spine revealed no acute intracranial abnormality.  No skull fracture. Unchanged atrophy, chronic small vessel ischemia, and left parietal encephalomalacia. Advanced multilevel degenerative change in the cervical spine without evidence of acute fracture. Soft tissue prominence involving the right hypopharynx is only partially included in the field of view, this may represent retained secretions or asymmetric positioning of the patient's tongue, however underlying mass lesion is not excluded. Consider contrast enhanced CT of the neck as clinically indicated. Moderate right pleural effusion partially included in the field of view. 10/2-Echo revealed EF 65% 10/3-CT Head revealed no evident anoxic injury.Atrophy and remote left frontal parietal infarct 10/10-Failure to wean from vent and difficult intubation upon reintubation therefore ENT consulted for tracheostomy placement   HISTORY OF PRESENT ILLNESS:   This is an 84 yo male with a PMH of Obesity, HTN, Hyperlipidemia, GERD, Diabetes Mellitus, Depression, Alzheimer's, CAD, COPD, CKD stage III, Chronic Diastolic CHF, and Anxiety.  He presented to [facility] ER on 09/30 from [facility] after being found pulseless on the bathroom floor by staff.  Per ER notes several rounds of CPR initiated by staff at [facility]. Initial cardiac rhythm unknown, pt did not receive medications or defibrillation during CPR.  Upon EMS arrival pt had a pulse cardiac  rhythm bradycardia, he required bag mask ventilation due to agonal respirations with hypoxia.  Upon arrival to the ER pt mechanically intubated by ER physician and cooling protocol initiated.  CT Head negative, initial EKG sinus bradycardia hr 53 bpm, and, CXR revealed LLL pneumonia and pulmonary edema.  Lab results revealed K+ 6.3, glucose 210, BUN 62, creatinine 1.98, AST 51, hgb 6.3, and troponin <0.03. Pt received iv abx and 2 units pRBC's. Urology consulted by ER physician for difficult foley catheter placement.  He was subsequently admitted to ICU by hospitalist team for further workup and treatment.    REVIEW OF SYSTEMS:   Unable to assess pt intubated   SUBJECTIVE:  Unable to assess pt intubated   CULTURES: Blood x2 10/1>>negative  Urine 10/1>>negative  Wound culture left foot 10/1>>MRSA  Would culture right leg 10/1>>mixed anaerobic flora   VITAL SIGNS: Temp:  [94 F (34.4 C)-98.4 F (36.9 C)] 97.6 F (36.4 C) (10/11 1200) Pulse Rate:  [60-76] 70 (10/11 1200) Resp:  [16] 16 (10/11 1200) BP: (132-163)/(44-66) 145/52 (10/11 1200) SpO2:  [93 %-99 %] 98 % (10/11 1200) FiO2 (%):  [30 %] 30 % (10/11 1100) Weight:  [131 kg] 131 kg (10/11 0500)  PHYSICAL EXAMINATION: General: acutely ill appearing male, NAD mechanically intubated  Neuro: sedated, not following commands, PERRL  HEENT: supple, no JVD Cardiovascular: nsr, rrr, no R/G Lungs: clear throughout, even, non labored  Abdomen: +BSBS x4, taut, non distended  GU: bilateral hydroceles with scrotal edema  Musculoskeletal: 2+ bilateral lower extremity edema  Skin: left and right foot diabetic ulcers dressings dry and intact   Recent Labs  Lab 10/15/17 0435 10/16/17 0304 10/17/17 0307  NA 150* 149* 147*  K 4.5 4.3 4.1  CL 115* 115* 112*  CO2 29  30 31  BUN 45* 44* 46*  CREATININE 0.93 0.85 0.88  GLUCOSE 164* 151* 152*   Recent Labs  Lab 10/15/17 0435 10/16/17 0304 10/17/17 0307  HGB 8.8* 7.7* 8.0*  HCT 30.7*  26.7* 27.4*  WBC 6.2 5.0 5.2  PLT 234 222 234   Dg Chest Port 1 View  Result Date: 10/16/2017 CLINICAL DATA:  Ventilated patient. History of congestive heart failure, COPD and CAD. EXAM: PORTABLE CHEST 1 VIEW COMPARISON:  10/15/2017 FINDINGS: Endotracheal tube and enteric catheter in stable position. Stable enlargement of the cardiac silhouette. Low lung volumes with mild interstitial opacities. Bilateral pleural effusions with bibasilar atelectasis. Osseous structures are without acute abnormality. Soft tissues are grossly normal. IMPRESSION: Enlarged cardiac silhouette with mild interstitial pulmonary edema. Bilateral pleural effusions with bibasilar atelectasis. Electronically Signed   By: Fidela Salisbury M.D.   On: 10/16/2017 11:25   Dg Chest Port 1 View  Result Date: 10/15/2017 CLINICAL DATA:  Followup ventilated patient. EXAM: PORTABLE CHEST 1 VIEW COMPARISON:  10/13/2017 and older studies. FINDINGS: Endotracheal tube and nasal/orogastric tube are stable in well positioned. There is persistent opacity extending from the mid lungs to the lung bases, with the opacities more confluent and obscures hemidiaphragms. The opacities consistent with moderate bilateral pleural effusions with associated atelectasis. No convincing pulmonary edema. No pneumothorax. IMPRESSION: 1. Improved lung aeration since the most recent prior exam. Pulmonary vasculature appears normal and there is no significant interstitial thickening. No residual pulmonary edema. 2. Mid to lower lung opacity is consistent with moderate pleural effusions with associated atelectasis. This is stable from the most recent prior study Electronically Signed   By: Lajean Manes M.D.   On: 10/15/2017 16:27   US Scrotum W/doppler  Result Date: 10/15/2017 CLINICAL DATA:  Testicular swelling. EXAM: SCROTAL ULTRASOUND DOPPLER ULTRASOUND OF THE TESTICLES TECHNIQUE: Complete ultrasound examination of the testicles, epididymis, and other scrotal  structures was performed. Color and spectral Doppler ultrasound were also utilized to evaluate blood flow to the testicles. COMPARISON:  None. FINDINGS: Right testicle Measurements: 5.2 x 1.9 x 1.9 cm. No mass or microlithiasis visualized. Left testicle Measurements: 5.6 x 2.4 x 2 cm. No mass or microlithiasis visualized. Right epididymis:  Limited evaluation. Left epididymis:  Limited evaluation. Hydrocele:  There are very large bilateral hydroceles. Varicocele:  Limited evaluation. Pulsed Doppler interrogation of both testes demonstrates normal low resistance arterial and venous waveforms bilaterally. IMPRESSION: Bilateral testis are normal. There is no evidence of testicular torsion. There are bilateral large hydroceles. Electronically Signed   By: Abelardo Diesel M.D.   On: 10/15/2017 20:21    ASSESSMENT / PLAN:  Acute respiratory failure secondary to LLL pneumonia and pulmonary edema  Mechanical intubation s/p cardiac arrest  Hx: COPD and Obesity  Full vent support-vent setting reviewed and established SBT once all parameters met  Prn bronchodilator therapy  VAP bundle implemented ENT consulted for tracheostomy placement   Acute on chronic diastolic CHF Cardiac Arrest (cardiac rhythm unknown)-hypothermic protocol completed  Bradycardia-resolved   Hx: HTN, CAD, and Hyperlipidemia  Continuous telemetry monitoring  Maintain map >65 IV lasix as bp permits  Prn hydralazine for bp control  Cardiology consulted appreciate input  Acute on chronic renal failure with hyperkalemia-resolved   Metabolic acidosis-resolved   Trend BMP Replace electrolytes as indicated  Monitor UOP  Avoid nephrotoxic medications   LLL pneumonia  Diabetic Ulcers Bilateral Lower Extremities Trend WBC and monitor fever curve  Trend PCT and lactic acid Follow cultures  Continue meropenum  Anemia without obvious acute blood loss  Trend CBC  Monitor for s/sx of bleeding  Transfuse for hgb <7 VTE px: subq  heparin   Nutrition Constipation  SUP px: pepcid Continue TF's  Continue bowel regimen   Hyperglycemia  CBG's q4hrs  SSI  Acute encephalopathy  Agitation/Delirium  Hx: Dementia, Depression, and Anxiety  Maintain RASS goal 0 to -1 Fentanyl gtt and prn versed to maintain RASS goal  Continue xanax   -I spoke with pts brother via telephone again today and he confirmed he would like to proceed with tracheostomy placement   Marda Stalker, [place] Pager [phone] (please enter 7 digits) PCCM Consult Pager [phone] (please enter 7 digits)

## 2017-10-17 NOTE — Progress Notes (Signed)
Patient ID: Billy Liu, male   DOB: 05-12-1932, 84 y.o.   MRN: [id number]  [employer] PROGRESS NOTE  Billy Liu EQA:[id number] DOB: 02-10-1932 DOA: 10/06/2017 PCP: Billy Morin, MD  HPI/Subjective: Patient will need trach and PEG remains sedated Objective: Vitals:   10/17/17 1100 10/17/17 1200  BP: (!) 146/51 (!) 145/52  Pulse: 74 70  Resp: 16 16  Temp:  97.6 F (36.4 C)  SpO2: 97% 98%    Filed Weights   10/15/17 0434 10/16/17 0500 10/17/17 0500  Weight: 130.3 kg 128.3 kg 131 kg    ROS: Review of Systems  Unable to perform ROS: Acuity of condition   Exam: Physical Exam  Constitutional: He is intubated.  HENT:  Nose: Mucosal edema present.  Unable to look into mouth  Eyes: Conjunctivae and lids are normal.  Pupils pinpoint  Neck: Carotid bruit is not present.  Cardiovascular: Regular rhythm, S1 normal, S2 normal and normal heart sounds.  Respiratory: He is intubated. He has decreased breath sounds in the right lower field and the left lower field. He has no wheezes. He has rhonchi in the right lower field and the left lower field.  GI: Bowel sounds are normal. There is no tenderness.  Musculoskeletal:       Right ankle: He exhibits swelling.       Left ankle: He exhibits swelling.  Skin: Skin is warm.      Data Reviewed: Basic Metabolic Panel: Recent Labs  Lab 10/12/17 1728  10/13/17 0904 10/14/17 0604 10/15/17 0435 10/16/17 0304 10/17/17 0307  NA 144  --  145 146* 150* 149* 147*  K 5.1   < > 4.9 4.7 4.5 4.3 4.1  CL 113*  --  110 113* 115* 115* 112*  CO2 28  --  '28 30 29 30 31  ' GLUCOSE 255*  --  194* 161* 164* 151* 152*  BUN 47*  --  39* 40* 45* 44* 46*  CREATININE 0.91  --  0.84 0.86 0.93 0.85 0.88  CALCIUM 8.5*  --  8.7* 8.7* 8.9 8.7* 8.6*  MG 2.7*  --   --   --   --   --   --    < > = values in this interval not displayed.   Liver Function Tests: Recent Labs  Lab 10/13/17 0904 10/16/17 0715  AST 18  --   ALT 17  --    ALKPHOS 60  --   BILITOT 0.3  --   PROT 6.4*  --   ALBUMIN 2.4* 2.4*   CBC: Recent Labs  Lab 10/11/17 0516 10/13/17 0904 10/15/17 0435 10/16/17 0304 10/17/17 0307  WBC 9.5 6.1 6.2 5.0 5.2  NEUTROABS 7.6* 4.4  --  3.4  --   HGB 8.5* 8.8* 8.8* 7.7* 8.0*  HCT 27.8* 28.0* 30.7* 26.7* 27.4*  MCV 85.8 86.1 90.6 90.2 89.0  PLT 267 249 234 222 234   Cardiac Enzymes: No results for input(s): CKTOTAL, CKMB, CKMBINDEX, TROPONINI in the last 168 hours.  CBG: Recent Labs  Lab 10/16/17 1920 10/16/17 2337 10/17/17 0405 10/17/17 0708 10/17/17 1158  GLUCAP 143* 139* 139* 139* 143*    Recent Results (from the past 240 hour(s))  Culture, respiratory (non-expectorated)     Status: None   Collection Time: 10/13/17 12:50 PM  Result Value Ref Range Status   Specimen Description   Final    TRACHEAL ASPIRATE Performed at [facility] [address] [place], [place] [address]    Special Requests  Final    NONE Performed at [facility], [place], [place] [address]    Gram Stain   Final    FEW WBC PRESENT, PREDOMINANTLY PMN MODERATE GRAM POSITIVE RODS FEW GRAM VARIABLE ROD FEW GRAM NEGATIVE RODS RARE GRAM POSITIVE COCCI    Culture   Final    Consistent with normal respiratory flora. Performed at [facility], [place] [address] [place], [place] [address]    Report Status 10/15/2017 FINAL  Final     Studies: Dg Chest Port 1 View  Result Date: 10/16/2017 CLINICAL DATA:  Ventilated patient. History of congestive heart failure, COPD and CAD. EXAM: PORTABLE CHEST 1 VIEW COMPARISON:  10/15/2017 FINDINGS: Endotracheal tube and enteric catheter in stable position. Stable enlargement of the cardiac silhouette. Low lung volumes with mild interstitial opacities. Bilateral pleural effusions with bibasilar atelectasis. Osseous structures are without acute abnormality. Soft tissues are grossly normal. IMPRESSION: Enlarged cardiac silhouette with mild  interstitial pulmonary edema. Bilateral pleural effusions with bibasilar atelectasis. Electronically Signed   By: Fidela Salisbury M.D.   On: 10/16/2017 11:25   Dg Chest Port 1 View  Result Date: 10/15/2017 CLINICAL DATA:  Followup ventilated patient. EXAM: PORTABLE CHEST 1 VIEW COMPARISON:  10/13/2017 and older studies. FINDINGS: Endotracheal tube and nasal/orogastric tube are stable in well positioned. There is persistent opacity extending from the mid lungs to the lung bases, with the opacities more confluent and obscures hemidiaphragms. The opacities consistent with moderate bilateral pleural effusions with associated atelectasis. No convincing pulmonary edema. No pneumothorax. IMPRESSION: 1. Improved lung aeration since the most recent prior exam. Pulmonary vasculature appears normal and there is no significant interstitial thickening. No residual pulmonary edema. 2. Mid to lower lung opacity is consistent with moderate pleural effusions with associated atelectasis. This is stable from the most recent prior study Electronically Signed   By: Lajean Manes M.D.   On: 10/15/2017 16:27   US Scrotum W/doppler  Result Date: 10/15/2017 CLINICAL DATA:  Testicular swelling. EXAM: SCROTAL ULTRASOUND DOPPLER ULTRASOUND OF THE TESTICLES TECHNIQUE: Complete ultrasound examination of the testicles, epididymis, and other scrotal structures was performed. Color and spectral Doppler ultrasound were also utilized to evaluate blood flow to the testicles. COMPARISON:  None. FINDINGS: Right testicle Measurements: 5.2 x 1.9 x 1.9 cm. No mass or microlithiasis visualized. Left testicle Measurements: 5.6 x 2.4 x 2 cm. No mass or microlithiasis visualized. Right epididymis:  Limited evaluation. Left epididymis:  Limited evaluation. Hydrocele:  There are very large bilateral hydroceles. Varicocele:  Limited evaluation. Pulsed Doppler interrogation of both testes demonstrates normal low resistance arterial and venous waveforms  bilaterally. IMPRESSION: Bilateral testis are normal. There is no evidence of testicular torsion. There are bilateral large hydroceles. Electronically Signed   By: Abelardo Diesel M.D.   On: 10/15/2017 20:21    Scheduled Meds: . ALPRAZolam  0.5 mg Oral TID  . chlorhexidine gluconate (MEDLINE KIT)  15 mL Mouth Rinse BID  . famotidine  20 mg Per Tube BID  . feeding supplement (PRO-STAT SUGAR FREE 64)  60 mL Per Tube TID  . free water  200 mL Per Tube Q4H  . furosemide  40 mg Intravenous BID  . heparin  5,000 Units Subcutaneous Q8H  . Influenza vac split quadrivalent PF  0.5 mL Intramuscular Tomorrow-1000  . insulin aspart  0-20 Units Subcutaneous Q4H  . insulin detemir  10 Units Subcutaneous BID  . mouth rinse  15 mL Mouth Rinse 10 times per day  .  multivitamin  15 mL Per Tube Daily  . polyethylene glycol  17 g Per Tube Daily  . senna-docusate  2 tablet Per Tube BID   Continuous Infusions: . sodium chloride Stopped (10/15/17 0428)  . feeding supplement (VITAL HIGH PROTEIN) 1,000 mL (10/17/17 1258)  . fentaNYL infusion INTRAVENOUS 350 mcg/hr (10/17/17 1100)  . meropenem (MERREM) IV 1 g (10/17/17 1427)  . propofol (DIPRIVAN) infusion 40 mcg/kg/min (10/17/17 1256)  . valproate sodium 250 mg (10/17/17 1241)    Assessment/Plan:  1. Cardiac arrest.  Patient underwent hypothermia protocol.  Continue supportive care.  MRI shows old stroke neurology saw the patient, supportive care 2. Acute hypoxic respiratory failure.  Continue ventilator support.  Continues to require 30% FiO2 intubation per pulmonary critical care will need trach 3. left lower lobe pneumonia, right lower extremity ulceration with foul-smelling discharge, left first toe ulceration and swelling of the first toe likely chronic infection.  Continue IV vancomycin 4. Acute kidney injury and hyperkalemia.   This has improved. 5. History of chronic diastolic congestive heart failure 6. Overall prognosis poor and high risk for  cardiopulmonary arrest.  PEG tube trach per family 7. Anemia.  Responded to transfusion.    Code Status:     Code Status Orders  (From admission, onward)         Start     Ordered   10/07/17 0132  Full code  Continuous     10/07/17 0131        Code Status History    Date Active Date Inactive Code Status Order ID Comments User Context   10/07/2017 0031 10/07/2017 0131 Full Code [id number]  Awilda Bill, NP ED   06/17/2017 1856 06/23/2017 2244 Full Code [id number]  Nicholes Mango, MD Inpatient   02/18/2017 1623 02/18/2017 2133 Full Code [id number]  Delana Meyer, Dolores Lory, MD Inpatient   02/11/2017 1138 02/11/2017 1736 Full Code [id number]  Schnier, Dolores Lory, MD Inpatient   10/24/2014 1934 10/31/2014 1837 Full Code [id number]  Fritzi Mandes, MD Inpatient   10/11/2014 2347 10/17/2014 1844 Full Code [id number]  Lance Coon, MD Inpatient     Family Communication: As per critical care team Disposition Plan: To be determined  Consultants:  Critical care specialist  Antibiotics:  Cefepime  Time spent: 35 minutes [place]

## 2017-10-18 LAB — GLUCOSE, CAPILLARY
GLUCOSE-CAPILLARY: 109 mg/dL — AB (ref 70–99)
GLUCOSE-CAPILLARY: 116 mg/dL — AB (ref 70–99)
GLUCOSE-CAPILLARY: 133 mg/dL — AB (ref 70–99)
GLUCOSE-CAPILLARY: 136 mg/dL — AB (ref 70–99)
Glucose-Capillary: 113 mg/dL — ABNORMAL HIGH (ref 70–99)
Glucose-Capillary: 119 mg/dL — ABNORMAL HIGH (ref 70–99)

## 2017-10-18 LAB — BASIC METABOLIC PANEL
ANION GAP: 6 (ref 5–15)
BUN: 38 mg/dL — ABNORMAL HIGH (ref 8–23)
CALCIUM: 8.8 mg/dL — AB (ref 8.9–10.3)
CO2: 31 mmol/L (ref 22–32)
CREATININE: 0.77 mg/dL (ref 0.61–1.24)
Chloride: 107 mmol/L (ref 98–111)
Glucose, Bld: 138 mg/dL — ABNORMAL HIGH (ref 70–99)
Potassium: 4.1 mmol/L (ref 3.5–5.1)
SODIUM: 144 mmol/L (ref 135–145)

## 2017-10-18 LAB — CBC
HEMATOCRIT: 28 % — AB (ref 39.0–52.0)
HEMOGLOBIN: 8.4 g/dL — AB (ref 13.0–17.0)
MCH: 25.8 pg — AB (ref 26.0–34.0)
MCHC: 30 g/dL (ref 30.0–36.0)
MCV: 86.2 fL (ref 80.0–100.0)
NRBC: 0.4 % — AB (ref 0.0–0.2)
Platelets: 258 10*3/uL (ref 150–400)
RBC: 3.25 MIL/uL — AB (ref 4.22–5.81)
RDW: 17.3 % — ABNORMAL HIGH (ref 11.5–15.5)
WBC: 5.2 10*3/uL (ref 4.0–10.5)

## 2017-10-18 LAB — PHOSPHORUS: PHOSPHORUS: 3.7 mg/dL (ref 2.5–4.6)

## 2017-10-18 LAB — MAGNESIUM: MAGNESIUM: 1.8 mg/dL (ref 1.7–2.4)

## 2017-10-18 NOTE — Progress Notes (Signed)
SUBJECTIVE: Intubated, eyes open briefly to voice.    Vitals:   10/18/17 0400 10/18/17 0500 10/18/17 0600 10/18/17 0730  BP: (!) 144/54 (!) 160/60 (!) 144/50   Pulse: 67 71 69   Resp: 16 16 16    Temp: 98.7 F (37.1 C)     TempSrc: Axillary     SpO2: 98% 98% 98% 99%  Weight:  127.6 kg    Height:        Intake/Output Summary (Last 24 hours) at 10/18/2017 1011 Last data filed at 10/18/2017 0600 Gross per 24 hour  Intake 1099.62 ml  Output 5705 ml  Net -4605.38 ml    LABS: Basic Metabolic Panel: Recent Labs    10/17/17 0307 10/18/17 0516  NA 147* 144  K 4.1 4.1  CL 112* 107  CO2 31 31  GLUCOSE 152* 138*  BUN 46* 38*  CREATININE 0.88 0.77  CALCIUM 8.6* 8.8*  MG  --  1.8  PHOS  --  3.7   Liver Function Tests: Recent Labs    10/16/17 0715  ALBUMIN 2.4*   No results for input(s): LIPASE, AMYLASE in the last 72 hours. CBC: Recent Labs    10/16/17 0304 10/17/17 0307 10/18/17 0516  WBC 5.0 5.2 5.2  NEUTROABS 3.4  --   --   HGB 7.7* 8.0* 8.4*  HCT 26.7* 27.4* 28.0*  MCV 90.2 89.0 86.2  PLT 222 234 258   Cardiac Enzymes: No results for input(s): CKTOTAL, CKMB, CKMBINDEX, TROPONINI in the last 72 hours. BNP: Invalid input(s): POCBNP D-Dimer: No results for input(s): DDIMER in the last 72 hours. Hemoglobin A1C: No results for input(s): HGBA1C in the last 72 hours. Fasting Lipid Panel: No results for input(s): CHOL, HDL, LDLCALC, TRIG, CHOLHDL, LDLDIRECT in the last 72 hours. Thyroid Function Tests: No results for input(s): TSH, T4TOTAL, T3FREE, THYROIDAB in the last 72 hours.  Invalid input(s): FREET3 Anemia Panel: No results for input(s): VITAMINB12, FOLATE, FERRITIN, TIBC, IRON, RETICCTPCT in the last 72 hours.  PHYSICAL EXAM General:Critically ill elderly gentleman, intubated. HEENT:Normocephalic and atramatic Neck: No JVD.  Lungs:Diminished, congested bilaterally.[name] Heart:HRRR . Normal S1 and S2 without gallops or murmurs.  Abdomen: Bowel  sounds are positive, abdomen soft and non-tender  Msk: Back normal, normal gait. Normal strength and tone for age. Extremities:2-3+ non pitting edema bilaterally..  Neuro:Intubated, minimally responsive..  TELEMETRY: NSR 72bpm, ST depression noted.   ASSESSMENT AND PLAN: Status post cardiac arrest with pneumonia and respiratory failure patient remains intubated and unresponsive.Failed self extubation x 2, may require trach. Hemodynamically stableat the moment, in normal sinus rhythm.  Active Problems:   Cardiac arrest [facility])    Caroleen Hamman, NP-C 10/18/2017 10:11 AM Cell: [phone]

## 2017-10-18 NOTE — Consult Note (Signed)
Pharmacy Antibiotic Note  Cadarius Nevares is a 84 y.o. male admitted on 10/06/2017 with cardiac arrest. Patient currently intubated with brief self-extubation on 10/7 and witnessed aspiration event. Patient with MRSA growing on left toe. Patient with allergy history significant for penicillin; no noted penicillin challenge in patient's medical history. Pharmacy has been consulted for Vancomycin + Meropenem dosing.  Plan: Continue Merrem 1 g IV every 8 hours x 7 days    Height: 6\' 3"  (190.5 cm) Weight: 281 lb 4.9 oz (127.6 kg) IBW/kg (Calculated) : 84.5  Temp (24hrs), Avg:98 F (36.7 C), Min:97.6 F (36.4 C), Max:98.7 F (37.1 C)  Recent Labs  Lab 10/13/17 0904 10/14/17 0604 10/14/17 1430 10/15/17 0435 10/16/17 0304 10/16/17 0715 10/17/17 0307 10/18/17 0516  WBC 6.1  --   --  6.2 5.0  --  5.2 5.2  CREATININE 0.84 0.86  --  0.93 0.85  --  0.88 0.77  VANCOTROUGH  --   --  19  --   --  18  --   --     Estimated Creatinine Clearance: 100.6 mL/min (by C-G formula based on SCr of 0.77 mg/dL).    Allergies  Allergen Reactions  . Penicillins Other (See Comments)    Per MAR Has patient had a PCN reaction causing immediate rash, facial/tongue/throat swelling, SOB or lightheadedness with hypotension: Unknown Has patient had a PCN reaction causing severe rash involving mucus membranes or skin necrosis: Unknown Has patient had a PCN reaction that required hospitalization: Unknown Has patient had a PCN reaction occurring within the last 10 years: Unknown If all of the above answers are "NO", then may proceed with Cephalosporin use.     Antimicrobials this admission: Cefepime 0930 >> 10/6 Vancomycin 0930 >> 10/1, 10/7 >>10/10 Meropenem 10/7 >> 10/13  Dose Adjustments 10/8 Decreased to Vancomycin 1500 mg Q12H from Vancomycin 1250 mg every 8 hours.   Microbiology results: 0930 BCx: NG x 5 days 10/1 UCx: NG 10/1 Wound Cx toe - MRSA and resistance to erythromycin 10/1Wound Cx leg  - multiple organisms; anaerobic 10/1 MRSA PCR: (-) 10/7 Resp Cx: few WBCx, moderate GPCs  Thank you for allowing pharmacy to be a part of this patient's care.  Pharmacy will continue to monitor.  Marty Heck, PharmD, BCPS 10/18/2017 11:31 AM

## 2017-10-18 NOTE — Progress Notes (Signed)
Patient ID: Billy Liu, male   DOB: December 19, 1932, 84 y.o.   MRN: [id number]  [employer] PROGRESS NOTE  Billy Liu YJE:[id number] DOB: 08-14-1932 DOA: 10/06/2017 PCP: Alvester Morin, MD  HPI/Subjective: Patient will need trach and PEG remains sedated.  No overnight events. Objective: Vitals:   10/18/17 0600 10/18/17 0730  BP: (!) 144/50   Pulse: 69   Resp: 16   Temp:    SpO2: 98% 99%    Filed Weights   10/16/17 0500 10/17/17 0500 10/18/17 0500  Weight: 128.3 kg 131 kg 127.6 kg    ROS: Review of Systems  Unable to perform ROS: Acuity of condition   Exam: Physical Exam  Constitutional: He is intubated.  HENT:  Nose: Mucosal edema present.  Unable to look into mouth  Eyes: Conjunctivae and lids are normal.  Pupils pinpoint  Neck: Carotid bruit is not present.  Cardiovascular: Regular rhythm, S1 normal, S2 normal and normal heart sounds.  Respiratory: He is intubated. He has decreased breath sounds in the right lower field and the left lower field. He has no wheezes. He has rhonchi in the right lower field and the left lower field.  GI: Bowel sounds are normal. There is no tenderness.  Musculoskeletal:       Right ankle: He exhibits swelling.       Left ankle: He exhibits swelling.  Skin: Skin is warm.      Data Reviewed: Basic Metabolic Panel: Recent Labs  Lab 10/12/17 1728  10/14/17 0604 10/15/17 0435 10/16/17 0304 10/17/17 0307 10/18/17 0516  NA 144   < > 146* 150* 149* 147* 144  K 5.1   < > 4.7 4.5 4.3 4.1 4.1  CL 113*   < > 113* 115* 115* 112* 107  CO2 28   < > '30 29 30 31 31  ' GLUCOSE 255*   < > 161* 164* 151* 152* 138*  BUN 47*   < > 40* 45* 44* 46* 38*  CREATININE 0.91   < > 0.86 0.93 0.85 0.88 0.77  CALCIUM 8.5*   < > 8.7* 8.9 8.7* 8.6* 8.8*  MG 2.7*  --   --   --   --   --  1.8  PHOS  --   --   --   --   --   --  3.7   < > = values in this interval not displayed.   Liver Function Tests: Recent Labs  Lab 10/13/17 0904  10/16/17 0715  AST 18  --   ALT 17  --   ALKPHOS 60  --   BILITOT 0.3  --   PROT 6.4*  --   ALBUMIN 2.4* 2.4*   CBC: Recent Labs  Lab 10/13/17 0904 10/15/17 0435 10/16/17 0304 10/17/17 0307 10/18/17 0516  WBC 6.1 6.2 5.0 5.2 5.2  NEUTROABS 4.4  --  3.4  --   --   HGB 8.8* 8.8* 7.7* 8.0* 8.4*  HCT 28.0* 30.7* 26.7* 27.4* 28.0*  MCV 86.1 90.6 90.2 89.0 86.2  PLT 249 234 222 234 258   Cardiac Enzymes: No results for input(s): CKTOTAL, CKMB, CKMBINDEX, TROPONINI in the last 168 hours.  CBG: Recent Labs  Lab 10/17/17 1634 10/17/17 1924 10/17/17 2324 10/18/17 0347 10/18/17 0711  GLUCAP 118* 143* 148* 113* 119*    Recent Results (from the past 240 hour(s))  Culture, respiratory (non-expectorated)     Status: None   Collection Time: 10/13/17 12:50 PM  Result Value Ref Range Status  Specimen Description   Final    TRACHEAL ASPIRATE Performed at [facility], [address], [place], [place] [address]    Special Requests   Final    NONE Performed at [facility], [address] [place], [place] [address]    Gram Stain   Final    FEW WBC PRESENT, PREDOMINANTLY PMN MODERATE GRAM POSITIVE RODS FEW GRAM VARIABLE ROD FEW GRAM NEGATIVE RODS RARE GRAM POSITIVE COCCI    Culture   Final    Consistent with normal respiratory flora. Performed at [facility], [place] [address], [place], [place] [address]    Report Status 10/15/2017 FINAL  Final     Studies: No results found.  Scheduled Meds: . ALPRAZolam  0.5 mg Oral TID  . chlorhexidine gluconate (MEDLINE KIT)  15 mL Mouth Rinse BID  . famotidine  20 mg Per Tube BID  . feeding supplement (PRO-STAT SUGAR FREE 64)  60 mL Per Tube TID  . free water  200 mL Per Tube Q4H  . furosemide  40 mg Intravenous BID  . heparin  5,000 Units Subcutaneous Q8H  . Influenza vac split quadrivalent PF  0.5 mL Intramuscular Tomorrow-1000  . insulin aspart  0-20 Units Subcutaneous Q4H  . insulin detemir  10  Units Subcutaneous BID  . mouth rinse  15 mL Mouth Rinse 10 times per day  . multivitamin  15 mL Per Tube Daily  . polyethylene glycol  17 g Per Tube Daily  . senna-docusate  2 tablet Per Tube BID   Continuous Infusions: . sodium chloride Stopped (10/15/17 0428)  . feeding supplement (VITAL HIGH PROTEIN) 15 mL/hr at 10/17/17 1258  . fentaNYL infusion INTRAVENOUS 350 mcg/hr (10/18/17 0535)  . meropenem (MERREM) IV 1 g (10/18/17 0550)  . propofol (DIPRIVAN) infusion 35 mcg/kg/min (10/18/17 1011)  . valproate sodium 250 mg (10/18/17 0453)    Assessment/Plan:  1. Cardiac arrest.  Patient underwent hypothermia protocol.  Continue supportive care.  MRI shows old stroke neurology saw the patient, supportive care, patient seen by neurology, had EEG, 2. Acute hypoxic respiratory failure.  Continue ventilator support.  Continues to require 30% FiO2 intubation per pulmonary critical care will need trach, failed extubation 2 times.,  Now needs trach and PEG.,  Status post bronchoscopy showed mucopurulent pneumonia, receiving meropenem. 3. left lower lobe pneumonia, right lower extremity ulceration with foul-smelling discharge, left first toe ulceration and swelling of the first toe likely chronic infection.  Continue IV vancomycin, wound cultures are positive for MRSA 4. Acute kidney injury and hyperkalemia.   This has improved. 5. History of chronic diastolic congestive heart failure, seen by cardiology. 6. Overall prognosis poor and high risk for cardiopulmonary arrest.  PEG tube trach per family, seen by ENT. 7. Anemia.  Responded to transfusion.   #8 diabetes mellitus type 2: Continue tube feeding, Levemir 10 units twice daily. History of dementia, patient is on Depakote. High risk for cardiac arrest.  Condition critical .family wants full code. Code Status:     Code Status Orders  (From admission, onward)         Start     Ordered   10/07/17 0132  Full code  Continuous     10/07/17 0131         Code Status History    Date Active Date Inactive Code Status Order ID Comments User Context   10/07/2017 0031 10/07/2017 0131 Full Code [id number]  Awilda Bill, NP ED   06/17/2017  1856 06/23/2017 2244 Full Code [id number]  Nicholes Mango, MD Inpatient   02/18/2017 1623 02/18/2017 2133 Full Code [id number]  Katha Cabal, MD Inpatient   02/11/2017 1138 02/11/2017 1736 Full Code [id number]  Katha Cabal, MD Inpatient   10/24/2014 1934 10/31/2014 1837 Full Code [id number]  Fritzi Mandes, MD Inpatient   10/11/2014 2347 10/17/2014 1844 Full Code [id number]  Lance Coon, MD Inpatient     Family Communication: As per critical care team Disposition Plan: To be determined  Consultants:  Critical care specialist  Antibiotics:  Meropenem  Time spent: 35 minutes [place]

## 2017-10-18 NOTE — Progress Notes (Signed)
CRITICAL CARE NOTE  CC  follow up respiratory failure/vent dependent  SUBJECTIVE Patient remains critically ill Prognosis is guarded He is sedated     SIGNIFICANT EVENTS No events    BP (!) 144/50   Pulse 69   Temp 98.7 F (37.1 C) (Axillary)   Resp 16   Ht '6\' 3"'  (1.905 m)   Wt 127.6 kg   SpO2 98%   BMI 35.16 kg/m    REVIEW OF SYSTEMS  PATIENT IS UNABLE TO PROVIDE COMPLETE REVIEW OF SYSTEMS DUE TO SEVERE CRITICAL ILLNESS   PHYSICAL EXAMINATION:  GENERAL:critically ill appearing, No resp distress HEAD: Normocephalic, atraumatic.  EYES: Pupils equal, round, reactive to light.  No scleral icterus.  MOUTH: Moist mucosal membrane. NECK: Supple. No thyromegaly. No nodules. No JVD.  PULMONARY: no rhonci or wheezing CARDIOVASCULAR: S1 and S2. Regular rate and rhythm. No murmurs, rubs, or gallops.  GASTROINTESTINAL: Soft, nontender, -distended. No masses. Positive bowel sounds. No hepatosplenomegaly.  MUSCULOSKELETAL: No swelling, clubbing, or edema.  Bilateral LE diabetic ulcers NEUROLOGIC:sedated on vent SKIN:intact,warm,dry  INTAKE/OUTPUT  Intake/Output Summary (Last 24 hours) at 10/18/2017 1209 Last data filed at 10/18/2017 0600 Gross per 24 hour  Intake 719.7 ml  Output 4330 ml  Net -3610.3 ml    LABS  CBC Recent Labs  Lab 10/16/17 0304 10/17/17 0307 10/18/17 0516  WBC 5.0 5.2 5.2  HGB 7.7* 8.0* 8.4*  HCT 26.7* 27.4* 28.0*  PLT 222 234 258   Coag's No results for input(s): APTT, INR in the last 168 hours. BMET Recent Labs  Lab 10/16/17 0304 10/17/17 0307 10/18/17 0516  NA 149* 147* 144  K 4.3 4.1 4.1  CL 115* 112* 107  CO2 '30 31 31  ' BUN 44* 46* 38*  CREATININE 0.85 0.88 0.77  GLUCOSE 151* 152* 138*   Electrolytes Recent Labs  Lab 10/12/17 1728  10/16/17 0304 10/17/17 0307 10/18/17 0516  CALCIUM 8.5*   < > 8.7* 8.6* 8.8*  MG 2.7*  --   --   --  1.8  PHOS  --   --   --   --  3.7   < > = values in this interval not displayed.    Sepsis Markers No results for input(s): LATICACIDVEN, PROCALCITON, O2SATVEN in the last 168 hours. ABG No results for input(s): PHART, PCO2ART, PO2ART in the last 168 hours. Liver Enzymes Recent Labs  Lab 10/13/17 0904 10/16/17 0715  AST 18  --   ALT 17  --   ALKPHOS 60  --   BILITOT 0.3  --   ALBUMIN 2.4* 2.4*   Cardiac Enzymes No results for input(s): TROPONINI, PROBNP in the last 168 hours. Glucose Recent Labs  Lab 10/17/17 1634 10/17/17 1924 10/17/17 2324 10/18/17 0347 10/18/17 0711 10/18/17 1158  GLUCAP 118* 143* 148* 113* 119* 136*     Recent Results (from the past 240 hour(s))  Culture, respiratory (non-expectorated)     Status: None   Collection Time: 10/13/17 12:50 PM  Result Value Ref Range Status   Specimen Description   Final    TRACHEAL ASPIRATE Performed at [facility], [address], [place], [place] [address]    Special Requests   Final    NONE Performed at [facility], [address] [place], [place] [address]    Gram Stain   Final    FEW WBC PRESENT, PREDOMINANTLY PMN MODERATE GRAM POSITIVE RODS FEW GRAM VARIABLE ROD FEW GRAM NEGATIVE RODS RARE GRAM POSITIVE COCCI  Culture   Final    Consistent with normal respiratory flora. Performed at [facility], [place] [address] [place], [place] [address]    Report Status 10/15/2017 FINAL  Final    MEDICATIONS   Current Facility-Administered Medications:  .  0.9 %  sodium chloride infusion, , Intravenous, PRN, Tukov-Yual, Arlyss Gandy, NP, Stopped at 10/15/17 0428 .  acetaminophen (TYLENOL) tablet 650 mg, 650 mg, Oral, Q6H PRN **OR** acetaminophen (TYLENOL) suppository 650 mg, 650 mg, Rectal, Q6H PRN, Amelia Jo, MD .  ALPRAZolam Duanne Moron) tablet 0.5 mg, 0.5 mg, Oral, TID, Flora Lipps, MD, 0.5 mg at 10/18/17 1051 .  atropine 1 MG/10ML injection 1 mg, 1 mg, Intravenous, PRN, Awilda Bill, NP, 1 mg at 10/07/17 1824 .  bisacodyl (DULCOLAX) EC tablet 5 mg, 5  mg, Oral, Daily PRN, Amelia Jo, MD .  chlorhexidine gluconate (MEDLINE KIT) (PERIDEX) 0.12 % solution 15 mL, 15 mL, Mouth Rinse, BID, Blakeney, Dreama Saa, NP, 15 mL at 10/18/17 0854 .  famotidine (PEPCID) tablet 20 mg, 20 mg, Per Tube, BID, Charlett Nose, RPH, 20 mg at 10/18/17 1050 .  feeding supplement (PRO-STAT SUGAR FREE 64) liquid 60 mL, 60 mL, Per Tube, TID, Rosine Door, MD, 60 mL at 10/18/17 1051 .  feeding supplement (VITAL HIGH PROTEIN) liquid 1,000 mL, 1,000 mL, Per Tube, Continuous, Rosine Door, MD, Last Rate: 15 mL/hr at 10/17/17 1258 .  fentaNYL (SUBLIMAZE) bolus via infusion 25-100 mcg, 25-100 mcg, Intravenous, Q1H PRN, Awilda Bill, NP, 100 mcg at 10/16/17 1032 .  fentaNYL 2547mg in NS 2572m(1073mml) infusion-PREMIX, 25-400 mcg/hr, Intravenous, Continuous, Blakeney, DanDreama SaaP, Last Rate: 35 mL/hr at 10/18/17 0535, 350 mcg/hr at 10/18/17 0535 .  free water 200 mL, 200 mL, Per Tube, Q4H, KeeDarel Hong NP, 200 mL at 10/18/17 0854 .  furosemide (LASIX) injection 40 mg, 40 mg, Intravenous, BID, Conforti, John, DO, 40 mg at 10/18/17 0854 .  heparin injection 5,000 Units, 5,000 Units, Subcutaneous, Q8H, MaiAmelia JoD, 5,000 Units at 10/18/17 0542 .  hydrALAZINE (APRESOLINE) injection 10-20 mg, 10-20 mg, Intravenous, Q4H PRN, BlaAwilda BillP, 20 mg at 10/13/17 1838 .  Influenza vac split quadrivalent PF (FLUZONE HIGH-DOSE) injection 0.5 mL, 0.5 mL, Intramuscular, Tomorrow-1000, Blakeney, Dana G, NP .  insulin aspart (novoLOG) injection 0-20 Units, 0-20 Units, Subcutaneous, Q4H, Tukov-Yual, Magdalene S, NP, 4 Units at 10/17/17 2335 .  insulin detemir (LEVEMIR) injection 10 Units, 10 Units, Subcutaneous, BID, Conforti, John, DO, 10 Units at 10/18/17 1052 .  MEDLINE mouth rinse, 15 mL, Mouth Rinse, 10 times per day, BlaAwilda BillP, 15 mL at 10/18/17 1053 .  meropenem (MERREM) 1 g in sodium chloride 0.9 % 100 mL IVPB, 1 g, Intravenous, Q8H, Conforti, John,  DO, Last Rate: 200 mL/hr at 10/18/17 0550, 1 g at 10/18/17 0550 .  midazolam (VERSED) injection 1 mg, 1 mg, Intravenous, Q15 min PRN, BlaAwilda BillP .  midazolam (VERSED) injection 1 mg, 1 mg, Intravenous, Q2H PRN, BlaAwilda BillP, 1 mg at 10/14/17 2232 .  multivitamin liquid 15 mL, 15 mL, Per Tube, Daily, Kasa, Kurian, MD, 15 mL at 10/18/17 1051 .  ondansetron (ZOFRAN) tablet 4 mg, 4 mg, Oral, Q6H PRN **OR** ondansetron (ZOFRAN) injection 4 mg, 4 mg, Intravenous, Q6H PRN, MaiAmelia JoD .  polyethylene glycol (MIRALAX / GLYCOLAX) packet 17 g, 17 g, Per Tube, Daily, Kasa, Kurian, MD, 17 g at 10/17/17 1002 .  propofol (DIPRIVAN) 1000  MG/100ML infusion, 5-80 mcg/kg/min, Intravenous, Continuous, Blakeney, Dana G, NP, Last Rate: 27.2 mL/hr at 21-Oct-2017 1011, 35 mcg/kg/min at 2017-10-21 1011 .  senna-docusate (Senokot-S) tablet 2 tablet, 2 tablet, Per Tube, BID, Flora Lipps, MD, 2 tablet at 10/21/2017 1051 .  traZODone (DESYREL) tablet 25 mg, 25 mg, Per Tube, QHS PRN, Darel Hong D, NP, 25 mg at 10/14/17 2147 .  valproate (DEPACON) 250 mg in dextrose 5 % 50 mL IVPB, 250 mg, Intravenous, Q8H, Kasa, Maretta Bees, MD, Last Rate: 52.5 mL/hr at 10/21/2017 0453, 250 mg at 10-21-17 0453      Indwelling Urinary Catheter continued, requirement due to   Reason to continue Indwelling Urinary Catheter for strict Intake/Output monitoring for hemodynamic instability   Central Line continued, requirement due to   Reason to continue [employer] Monitoring of central venous pressure or other hemodynamic parameters   Ventilator continued, requirement due to, resp failure    Ventilator Sedation RASS 0 to -2     ASSESSMENT AND PLAN SYNOPSIS Acute respiratory failure secondary to LLL pneumonia and pulmonary edema  Mechanical intubation s/p cardiac arrest  Hx: COPD and Obesity  Full vent support-vent Prn bronchodilator therapy  VAP bundle implemented ENT consulted for tracheostomy placement    Acute on chronic diastolic CHF Cardiac Arrest (cardiac rhythm unknown)-hypothermic protocol completed  Bradycardia-resolved   Hx: HTN, CAD, and Hyperlipidemia  Continuous telemetry monitoring  Maintain map >65 IV lasix as bp permits 40 mg bid  Prn hydralazine for bp control  Cardiology consulted appreciate input  Acute on chronic renal failure with hyperkalemia-resolved   Metabolic acidosis-resolved   Trend BMP Replace electrolytes as indicated  Monitor UOP  Avoid nephrotoxic medications   LLL pneumonia  Diabetic Ulcers Bilateral Lower Extremities Trend WBC and monitor fever curve  Trend PCT and lactic acid Follow cultures  Continue meropenum/vanco  Anemia without obvious acute blood loss  Trend CBC  Monitor for s/sx of bleeding  Transfuse for hgb <7 VTE px: subq heparin   Nutrition Constipation  SUP px: pepcid Continue TF's  Continue bowel regimen   Hyperglycemia  CBG's q4hrs  SSI  Acute encephalopathy  Agitation/Delirium  Hx: Dementia, Depression, and Anxiety  Maintain RASS goal 0 to -1 Fentanyl gtt and prn versed to maintain RASS goal  Continue xanax    Critical Care Time devoted to patient care services described in this note is 35 minutes.   Overall, patient is critically ill, prognosis is guarded.  Patient with Multiorgan failure and at high risk for cardiac arrest and death.   Rosine Door, MD  October 21, 2017 12:09 PM [facility]

## 2017-10-18 NOTE — Consult Note (Signed)
Pharmacy Electrolyte Monitoring Consult:  Pharmacy consulted to assist in monitoring and replacing electrolytes in this 84 y.o. male admitted on 10/06/2017 with Cardiac Arrest   Labs:  Sodium (mmol/L)  Date Value  10/18/2017 144  07/04/2013 133 (L)   Potassium (mmol/L)  Date Value  10/18/2017 4.1  07/04/2013 4.3   Magnesium (mg/dL)  Date Value  78/29/5621 1.8   Phosphorus (mg/dL)  Date Value  30/86/5784 3.7   Calcium (mg/dL)  Date Value  69/62/9528 8.8 (L)   Calcium, Total (mg/dL)  Date Value  41/32/4401 8.7   Albumin (g/dL)  Date Value  02/72/5366 2.4 (L)  07/04/2013 3.6    Assessment/Plan: 1. Electrolytes: no replacement warranted. Potassium was 4.1 this morning. Will monitor sodium; patient receiving 200 mL free water flushes q4h. Patient is also receiving lasix 40 mg IV BID.  2. Glucose mangagment: Levemir 10 units BID and SSI Q4hr. Patient receiving continuous tube feeds. Glucose 113-148 over past 24 hours. Will continue to monitor and adjust per consult.   3. Constipation Management: Last BM 10/11. Will follow up on rounds on Monday.  Continue daily Miralax and Senokot S BID.   Pharmacy will continue to monitor and adjust per consult.   Marty Heck, PharmD, BCPS 10/18/2017 11:23 AM

## 2017-10-18 NOTE — Plan of Care (Signed)
Patient alert to stimulation.  Remains intubated at this time.  Bilateral full body edema noted.  Scrotum remains swollen.  Dressing to right leg and left great toe done.  Will continue to monitor.  Remains in wrist restraints at this time. No acute concerns.

## 2017-10-19 ENCOUNTER — Inpatient Hospital Stay: Payer: Medicare Other

## 2017-10-19 LAB — CBC WITH DIFFERENTIAL/PLATELET
Abs Immature Granulocytes: 0.02 10*3/uL (ref 0.00–0.07)
BASOS ABS: 0.1 10*3/uL (ref 0.0–0.1)
BASOS PCT: 1 %
EOS ABS: 0.5 10*3/uL (ref 0.0–0.5)
EOS PCT: 10 %
HCT: 30.3 % — ABNORMAL LOW (ref 39.0–52.0)
Hemoglobin: 8.9 g/dL — ABNORMAL LOW (ref 13.0–17.0)
IMMATURE GRANULOCYTES: 0 %
Lymphocytes Relative: 20 %
Lymphs Abs: 1 10*3/uL (ref 0.7–4.0)
MCH: 25.6 pg — ABNORMAL LOW (ref 26.0–34.0)
MCHC: 29.4 g/dL — AB (ref 30.0–36.0)
MCV: 87.3 fL (ref 80.0–100.0)
Monocytes Absolute: 0.5 10*3/uL (ref 0.1–1.0)
Monocytes Relative: 9 %
NEUTROS PCT: 60 %
NRBC: 0 % (ref 0.0–0.2)
Neutro Abs: 3 10*3/uL (ref 1.7–7.7)
PLATELETS: 305 10*3/uL (ref 150–400)
RBC: 3.47 MIL/uL — AB (ref 4.22–5.81)
RDW: 17.2 % — AB (ref 11.5–15.5)
WBC: 5.1 10*3/uL (ref 4.0–10.5)

## 2017-10-19 LAB — GLUCOSE, CAPILLARY
GLUCOSE-CAPILLARY: 120 mg/dL — AB (ref 70–99)
GLUCOSE-CAPILLARY: 133 mg/dL — AB (ref 70–99)
Glucose-Capillary: 118 mg/dL — ABNORMAL HIGH (ref 70–99)
Glucose-Capillary: 160 mg/dL — ABNORMAL HIGH (ref 70–99)
Glucose-Capillary: 150 mg/dL — ABNORMAL HIGH (ref 70–99)
Glucose-Capillary: 130 mg/dL — ABNORMAL HIGH (ref 70–99)

## 2017-10-19 LAB — BASIC METABOLIC PANEL
ANION GAP: 7 (ref 5–15)
BUN: 39 mg/dL — ABNORMAL HIGH (ref 8–23)
CALCIUM: 8.8 mg/dL — AB (ref 8.9–10.3)
CO2: 31 mmol/L (ref 22–32)
CREATININE: 0.81 mg/dL (ref 0.61–1.24)
Chloride: 105 mmol/L (ref 98–111)
Glucose, Bld: 136 mg/dL — ABNORMAL HIGH (ref 70–99)
Potassium: 3.6 mmol/L (ref 3.5–5.1)
SODIUM: 143 mmol/L (ref 135–145)

## 2017-10-19 MED ORDER — POTASSIUM CHLORIDE 20 MEQ PO PACK
40.0000 meq | PACK | Freq: Once | ORAL | Status: AC
  Administered 2017-10-19: 40 meq via ORAL
  Filled 2017-10-19: qty 2

## 2017-10-19 MED ORDER — HYDRALAZINE HCL 20 MG/ML IJ SOLN
10.0000 mg | INTRAMUSCULAR | Status: DC | PRN
  Administered 2017-10-21: 10 mg via INTRAVENOUS
  Administered 2017-10-22: 20 mg via INTRAVENOUS
  Administered 2017-10-22: 10 mg via INTRAVENOUS
  Filled 2017-10-19 (×3): qty 1

## 2017-10-19 MED ORDER — POTASSIUM CHLORIDE CRYS ER 20 MEQ PO TBCR: 40.0000 meq | EXTENDED_RELEASE_TABLET | Freq: Once | ORAL | Status: DC

## 2017-10-19 NOTE — Plan of Care (Signed)
Fentanyl was held most of the day, but was returned to the patient in afternoon due to hypoxic event with increased peak pressures.

## 2017-10-19 NOTE — Consult Note (Signed)
Patient scheduled for tracheostomy tube in a.m. At 7:15.  Please hold anticoagulation and tube feeds after midnight.  Tracheostomy consent also needed.

## 2017-10-19 NOTE — Progress Notes (Signed)
CRITICAL CARE NOTE  CC  follow up respiratory failure/vent dependent/self extubated x 2   SUBJECTIVE Patient remains critically ill Prognosis is guarded     SIGNIFICANT EVENTS none    BP (!) 161/55   Pulse (!) 48   Temp 98.3 F (36.8 C) (Axillary)   Resp 16   Ht '6\' 3"'  (1.905 m)   Wt 124.4 kg   SpO2 100%   BMI 34.28 kg/m    REVIEW OF SYSTEMS  PATIENT IS UNABLE TO PROVIDE COMPLETE REVIEW OF SYSTEMS DUE TO SEVERE CRITICAL ILLNESS   PHYSICAL EXAMINATION:  GENERAL:critically ill appearing, HEAD: Normocephalic, atraumatic.  EYES: Pupils equal, round, reactive to light.  No scleral icterus.  MOUTH: Moist mucosal membrane. NECK: Supple. No thyromegaly. No nodules. No JVD.  PULMONARY:CTA with decreases breath sounds at bases without rhonci or wheezing CARDIOVASCULAR: S1 and S2. Regular rate and rhythm. No murmurs, rubs, or gallops.  GASTROINTESTINAL: Soft, nontender, but tense . No masses. Positive bowel sounds. No hepatosplenomegaly.  MUSCULOSKELETAL: No swelling, clubbing, or edema.  NEUROLOGIC: sedated heavily SKIN:intact,warm,dry  INTAKE/OUTPUT  Intake/Output Summary (Last 24 hours) at 10/19/2017 0800 Last data filed at 10/19/2017 0600 Gross per 24 hour  Intake 2479.82 ml  Output 4091 ml  Net -1611.18 ml    LABS  CBC Recent Labs  Lab 10/17/17 0307 10/18/17 0516 10/19/17 0423  WBC 5.2 5.2 5.1  HGB 8.0* 8.4* 8.9*  HCT 27.4* 28.0* 30.3*  PLT 234 258 305   Coag's No results for input(s): APTT, INR in the last 168 hours. BMET Recent Labs  Lab 10/17/17 0307 10/18/17 0516 10/19/17 0423  NA 147* 144 143  K 4.1 4.1 3.6  CL 112* 107 105  CO2 '31 31 31  ' BUN 46* 38* 39*  CREATININE 0.88 0.77 0.81  GLUCOSE 152* 138* 136*   Electrolytes Recent Labs  Lab 10/12/17 1728  10/17/17 0307 10/18/17 0516 10/19/17 0423  CALCIUM 8.5*   < > 8.6* 8.8* 8.8*  MG 2.7*  --   --  1.8  --   PHOS  --   --   --  3.7  --    < > = values in this interval not  displayed.   Sepsis Markers No results for input(s): LATICACIDVEN, PROCALCITON, O2SATVEN in the last 168 hours. ABG No results for input(s): PHART, PCO2ART, PO2ART in the last 168 hours. Liver Enzymes Recent Labs  Lab 10/13/17 0904 10/16/17 0715  AST 18  --   ALT 17  --   ALKPHOS 60  --   BILITOT 0.3  --   ALBUMIN 2.4* 2.4*   Cardiac Enzymes No results for input(s): TROPONINI, PROBNP in the last 168 hours. Glucose Recent Labs  Lab 10/18/17 1158 10/18/17 1605 10/18/17 1920 10/18/17 2330 10/19/17 0321 10/19/17 0726  GLUCAP 136* 109* 116* 133* 118* 120*     Recent Results (from the past 240 hour(s))  Culture, respiratory (non-expectorated)     Status: None   Collection Time: 10/13/17 12:50 PM  Result Value Ref Range Status   Specimen Description   Final    TRACHEAL ASPIRATE Performed at [facility], [address], [place], [place] [address]    Special Requests   Final    NONE Performed at [facility], [address] [place], [place] [address]    Gram Stain   Final    FEW WBC PRESENT, PREDOMINANTLY PMN MODERATE GRAM POSITIVE RODS FEW GRAM VARIABLE ROD FEW GRAM NEGATIVE RODS RARE GRAM POSITIVE COCCI  Culture   Final    Consistent with normal respiratory flora. Performed at [facility], [place] [address], [place], [place] [address]    Report Status 10/15/2017 FINAL  Final    MEDICATIONS   Current Facility-Administered Medications:  .  0.9 %  sodium chloride infusion, , Intravenous, PRN, Tukov-Yual, Arlyss Gandy, NP, Stopped at 10/15/17 0428 .  acetaminophen (TYLENOL) tablet 650 mg, 650 mg, Oral, Q6H PRN **OR** acetaminophen (TYLENOL) suppository 650 mg, 650 mg, Rectal, Q6H PRN, Amelia Jo, MD .  ALPRAZolam Duanne Moron) tablet 0.5 mg, 0.5 mg, Oral, TID, Kasa, Kurian, MD, 0.5 mg at 10/18/17 2230 .  atropine 1 MG/10ML injection 1 mg, 1 mg, Intravenous, PRN, Awilda Bill, NP, 1 mg at 10/07/17 1824 .  bisacodyl (DULCOLAX) EC tablet  5 mg, 5 mg, Oral, Daily PRN, Amelia Jo, MD .  chlorhexidine gluconate (MEDLINE KIT) (PERIDEX) 0.12 % solution 15 mL, 15 mL, Mouth Rinse, BID, Blakeney, Dreama Saa, NP, 15 mL at 10/18/17 1948 .  famotidine (PEPCID) tablet 20 mg, 20 mg, Per Tube, BID, Charlett Nose, RPH, 20 mg at 10/18/17 2231 .  feeding supplement (PRO-STAT SUGAR FREE 64) liquid 60 mL, 60 mL, Per Tube, TID, Rosine Door, MD, 60 mL at 10/18/17 2237 .  feeding supplement (VITAL HIGH PROTEIN) liquid 1,000 mL, 1,000 mL, Per Tube, Continuous, Rosine Door, MD, Last Rate: 15 mL/hr at 10/19/17 0400, 1,000 mL at 10/19/17 0400 .  fentaNYL (SUBLIMAZE) bolus via infusion 25-100 mcg, 25-100 mcg, Intravenous, Q1H PRN, Awilda Bill, NP, 100 mcg at 10/16/17 1032 .  fentaNYL 2580mg in NS 2551m(1088mml) infusion-PREMIX, 25-400 mcg/hr, Intravenous, Continuous, Blakeney, DanDreama SaaP, Last Rate: 35 mL/hr at 10/19/17 0250, 350 mcg/hr at 10/19/17 0250 .  free water 200 mL, 200 mL, Per Tube, Q4H, KeeDarel Hong NP, 200 mL at 10/19/17 0349 .  furosemide (LASIX) injection 40 mg, 40 mg, Intravenous, BID, Conforti, John, DO, 40 mg at 10/19/17 0744 .  heparin injection 5,000 Units, 5,000 Units, Subcutaneous, Q8H, MaiAmelia JoD, 5,000 Units at 10/19/17 0513 .  hydrALAZINE (APRESOLINE) injection 10-20 mg, 10-20 mg, Intravenous, Q4H PRN, BlaAwilda BillP, 10 mg at 10/19/17 0137 .  Influenza vac split quadrivalent PF (FLUZONE HIGH-DOSE) injection 0.5 mL, 0.5 mL, Intramuscular, Tomorrow-1000, Blakeney, Dana G, NP .  insulin aspart (novoLOG) injection 0-20 Units, 0-20 Units, Subcutaneous, Q4H, Tukov-Yual, Magdalene S, NP, 3 Units at 10/18/17 2330 .  insulin detemir (LEVEMIR) injection 10 Units, 10 Units, Subcutaneous, BID, Conforti, John, DO, 10 Units at 10/18/17 2233 .  MEDLINE mouth rinse, 15 mL, Mouth Rinse, 10 times per day, BlaAwilda BillP, 15 mL at 10/19/17 0512 .  meropenem (MERREM) 1 g in sodium chloride 0.9 % 100 mL IVPB, 1 g,  Intravenous, Q8H, Conforti, John, DO, Last Rate: 200 mL/hr at 10/19/17 0517, 1 g at 10/19/17 0517 .  midazolam (VERSED) injection 1 mg, 1 mg, Intravenous, Q15 min PRN, BlaAwilda BillP .  midazolam (VERSED) injection 1 mg, 1 mg, Intravenous, Q2H PRN, BlaAwilda BillP, 1 mg at 10/14/17 2232 .  multivitamin liquid 15 mL, 15 mL, Per Tube, Daily, Kasa, Kurian, MD, 15 mL at 10/18/17 1051 .  ondansetron (ZOFRAN) tablet 4 mg, 4 mg, Oral, Q6H PRN **OR** ondansetron (ZOFRAN) injection 4 mg, 4 mg, Intravenous, Q6H PRN, MaiAmelia JoD .  polyethylene glycol (MIRALAX / GLYCOLAX) packet 17 g, 17 g, Per Tube, Daily, Kasa, Kurian, MD, 17 g at 10/17/17 1002 .  propofol (DIPRIVAN) 1000 MG/100ML infusion, 5-80 mcg/kg/min, Intravenous, Continuous, Blakeney, Dreama Saa, NP, Last Rate: 27.2 mL/hr at Nov 15, 2017 0741, 35 mcg/kg/min at 11-15-17 0741 .  senna-docusate (Senokot-S) tablet 2 tablet, 2 tablet, Per Tube, BID, Flora Lipps, MD, 2 tablet at 10/18/17 2231 .  traZODone (DESYREL) tablet 25 mg, 25 mg, Per Tube, QHS PRN, Darel Hong D, NP, 25 mg at 10/14/17 2147 .  valproate (DEPACON) 250 mg in dextrose 5 % 50 mL IVPB, 250 mg, Intravenous, Q8H, Kasa, Maretta Bees, MD, Last Rate: 52.5 mL/hr at 2017-11-15 0353, 250 mg at Nov 15, 2017 0353      Indwelling Urinary Catheter continued, requirement due to   Reason to continue Indwelling Urinary Catheter for strict Intake/Output monitoring for hemodynamic instability   Central Line continued, requirement due to   Reason to continue [employer] Monitoring of central venous pressure or other hemodynamic parameters   Ventilator continued, requirement due to, resp failure    Ventilator Sedation RASS 0 to -2     ASSESSMENT AND PLAN SYNOPSIS Acute respiratory failure secondary to LLL pneumonia and pulmonary edema  Mechanical intubation s/p cardiac arrest  Hx: COPD and Obesity  Full vent support-vent Prn bronchodilator therapy  VAP bundle implemented ENT consulted  for tracheostomy placement Sedation vacation today to eval neurological status  Acute on chronic diastolic CHF Cardiac Arrest (cardiac rhythm unknown)-hypothermic protocol completed Bradycardia-resolved Hx: HTN, CAD, and Hyperlipidemia  Continuous telemetry monitoring  Maintain map >65 IV lasix as bp permits 40 mg bid Prn hydralazine for bp control Cardiology consulted appreciate input  Acute on chronic renal failure with hyperkalemia-resolved Metabolic acidosis-resolved Trend BMP Replace electrolytes as indicated  Monitor UOP  Avoid nephrotoxic medications   LLL pneumonia  Diabetic Ulcers Bilateral Lower Extremities Trend WBC and monitor fever curve  Trend PCT and lactic acid Follow cultures  Continuemeropenum/vanco  Anemia without obvious acute blood loss  Trend CBC  Monitor for s/sx of bleeding  Transfuse for hgb <7 VTE BT:YOMA heparin   Nutrition Constipation  SUP px: pepcid Continue TF's  Continue bowel regimen  Hyperglycemia CBG's q4hrs SSI  Acute encephalopathy Agitation/Delirium Hx: Dementia, Depression, and Anxiety Maintain RASS goal 0 to -1 Fentanyl gtt and prn versed to maintain RASS goal  Continue xanax      Critical Care Time devoted to patient care services described in this note is 35 minutes.   Overall, patient is critically ill, prognosis is guarded.  Patient with Multiorgan failure and at high risk for cardiac arrest and death.   Rosine Door, MD  2017/11/15 8:00 AM [facility]

## 2017-10-19 NOTE — Progress Notes (Addendum)
Patient ID: Billy Liu, male   DOB: 1932/06/13, 84 y.o.   MRN: [id number]  [employer] PROGRESS NOTE  Pranshu Lyster WUJ:[id number] DOB: March 09, 1932 DOA: 10/06/2017 PCP: Alvester Morin, MD  HPI/Subjective: Patient will need trach and PEG   Objective: Vitals:   10/19/17 1000 10/19/17 1100  BP: (!) 168/73 (!) 161/115  Pulse: 86 94  Resp: 15 17  Temp:    SpO2: 98% 95%    Filed Weights   10/17/17 0500 10/18/17 0500 10/19/17 0356  Weight: 131 kg 127.6 kg 124.4 kg    ROS: Review of Systems  Unable to perform ROS: Acuity of condition   Exam: Physical Exam  Constitutional: He is intubated.  HENT:  Nose: Mucosal edema present.  Unable to look into mouth  Eyes: Conjunctivae and lids are normal.  Pupils pinpoint  Neck: Carotid bruit is not present.  Cardiovascular: Regular rhythm, S1 normal, S2 normal and normal heart sounds.  Respiratory: He is intubated. He has decreased breath sounds in the right lower field and the left lower field. He has no wheezes. He has rhonchi in the right lower field and the left lower field.  GI: Bowel sounds are normal. There is no tenderness.  Musculoskeletal:       Right ankle: He exhibits swelling.       Left ankle: He exhibits swelling.  Skin: Skin is warm.      Data Reviewed: Basic Metabolic Panel: Recent Labs  Lab 10/12/17 1728  10/15/17 0435 10/16/17 0304 10/17/17 0307 10/18/17 0516 10/19/17 0423  NA 144   < > 150* 149* 147* 144 143  K 5.1   < > 4.5 4.3 4.1 4.1 3.6  CL 113*   < > 115* 115* 112* 107 105  CO2 28   < > '29 30 31 31 31  ' GLUCOSE 255*   < > 164* 151* 152* 138* 136*  BUN 47*   < > 45* 44* 46* 38* 39*  CREATININE 0.91   < > 0.93 0.85 0.88 0.77 0.81  CALCIUM 8.5*   < > 8.9 8.7* 8.6* 8.8* 8.8*  MG 2.7*  --   --   --   --  1.8  --   PHOS  --   --   --   --   --  3.7  --    < > = values in this interval not displayed.   Liver Function Tests: Recent Labs  Lab 10/13/17 0904 10/16/17 0715  AST 18  --    ALT 17  --   ALKPHOS 60  --   BILITOT 0.3  --   PROT 6.4*  --   ALBUMIN 2.4* 2.4*   CBC: Recent Labs  Lab 10/13/17 0904 10/15/17 0435 10/16/17 0304 10/17/17 0307 10/18/17 0516 10/19/17 0423  WBC 6.1 6.2 5.0 5.2 5.2 5.1  NEUTROABS 4.4  --  3.4  --   --  3.0  HGB 8.8* 8.8* 7.7* 8.0* 8.4* 8.9*  HCT 28.0* 30.7* 26.7* 27.4* 28.0* 30.3*  MCV 86.1 90.6 90.2 89.0 86.2 87.3  PLT 249 234 222 234 258 305   Cardiac Enzymes: No results for input(s): CKTOTAL, CKMB, CKMBINDEX, TROPONINI in the last 168 hours.  CBG: Recent Labs  Lab 10/18/17 1605 10/18/17 1920 10/18/17 2330 10/19/17 0321 10/19/17 0726  GLUCAP 109* 116* 133* 118* 120*    Recent Results (from the past 240 hour(s))  Culture, respiratory (non-expectorated)     Status: None   Collection Time: 10/13/17 12:50 PM  Result  Value Ref Range Status   Specimen Description   Final    TRACHEAL ASPIRATE Performed at [facility], [address], [place], [place] [address]    Special Requests   Final    NONE Performed at [facility], [address] [place], [place] [address]    Gram Stain   Final    FEW WBC PRESENT, PREDOMINANTLY PMN MODERATE GRAM POSITIVE RODS FEW GRAM VARIABLE ROD FEW GRAM NEGATIVE RODS RARE GRAM POSITIVE COCCI    Culture   Final    Consistent with normal respiratory flora. Performed at [facility], [place] [address] [place], [place] [address]    Report Status 10/15/2017 FINAL  Final     Studies: No results found.  Scheduled Meds: . ALPRAZolam  0.5 mg Oral TID  . chlorhexidine gluconate (MEDLINE KIT)  15 mL Mouth Rinse BID  . famotidine  20 mg Per Tube BID  . feeding supplement (PRO-STAT SUGAR FREE 64)  60 mL Per Tube TID  . free water  200 mL Per Tube Q4H  . furosemide  40 mg Intravenous BID  . heparin  5,000 Units Subcutaneous Q8H  . Influenza vac split quadrivalent PF  0.5 mL Intramuscular Tomorrow-1000  . insulin aspart  0-20 Units Subcutaneous Q4H  .  insulin detemir  10 Units Subcutaneous BID  . mouth rinse  15 mL Mouth Rinse 10 times per day  . multivitamin  15 mL Per Tube Daily  . polyethylene glycol  17 g Per Tube Daily  . senna-docusate  2 tablet Per Tube BID   Continuous Infusions: . sodium chloride Stopped (10/15/17 0428)  . feeding supplement (VITAL HIGH PROTEIN) 1,000 mL (10/19/17 0400)  . fentaNYL infusion INTRAVENOUS 50 mcg/hr (10/19/17 0800)  . meropenem (MERREM) IV Stopped (10/19/17 0547)  . propofol (DIPRIVAN) infusion 20 mcg/kg/min (10/19/17 0800)  . valproate sodium Stopped (10/19/17 0453)    Assessment/Plan:  1. Cardiac arrest.  Patient underwent hypothermia protocol.  Continue supportive care.  MRI shows old stroke neurology saw the patient, supportive care, patient seen by neurology, had EEG, showed background slowing. 2. ventilator dependent respiratory failure, patient supposed to get trach and PEG next week, left lower lobe pneumonia, continue antibiotics 3. right lower extremity ulceration with foul-smelling discharge, left first toe ulceration and swelling of the first toe likely chronic infection.  Continue IV vancomycin, meropenem.  Wound cultures are positive for MRSA 4. Acute kidney injur y and hyperkalemia.   This has improved. 5. History of chronic diastolic congestive heart failure, seen by cardiology. 6. Overall prognosis poor and high risk for cardiopulmonary arrest.  PEG tube trach per family, seen by ENT. 7. Anemia.  Responded to transfusion.   #8 diabetes mellitus type 2: Continue tube feeding, Levemir 10 units twice daily.  9,History of dementia, patient is on Depakote   10. acute encephalopathy, agitation, delirium, dementia, depression, anxiety: Patient is on fentanyl, Versed. . High risk for cardiac arrest.  Condition critical .family wants full code. Code Status:     Code Status Orders  (From admission, onward)         Start     Ordered   10/07/17 0132  Full code  Continuous      10/07/17 0131        Code Status History    Date Active Date Inactive Code Status Order ID Comments User Context   10/07/2017 0031 10/07/2017 0131 Full Code [id number]  Awilda Bill, NP ED   06/17/2017  1856 06/23/2017 2244 Full Code [id number]  Nicholes Mango, MD Inpatient   02/18/2017 1623 02/18/2017 2133 Full Code [id number]  Katha Cabal, MD Inpatient   02/11/2017 1138 02/11/2017 1736 Full Code [id number]  Katha Cabal, MD Inpatient   10/24/2014 1934 10/31/2014 1837 Full Code [id number]  Fritzi Mandes, MD Inpatient   10/11/2014 2347 10/17/2014 1844 Full Code [id number]  Lance Coon, MD Inpatient     Family Communication: As per critical care team Disposition Plan: To be determined  Consultants:  Critical care specialist  Antibiotics:  Meropenem  Time spent: 38 minutes [place]

## 2017-10-19 NOTE — Plan of Care (Signed)
Patient remains intubated.  No signs and symptoms of discomfort noted.  PRN hydralazine given for elevated blood pressure.  Patient continues with full body edema. Wound care completed.  Will continue to monitor.

## 2017-10-19 NOTE — Consult Note (Signed)
Pharmacy Electrolyte Monitoring Consult:  Pharmacy consulted to assist in monitoring and replacing electrolytes in this 84 y.o. male admitted on 10/06/2017 with Cardiac Arrest   Labs:  Sodium (mmol/L)  Date Value  10/19/2017 143  07/04/2013 133 (L)   Potassium (mmol/L)  Date Value  10/19/2017 3.6  07/04/2013 4.3   Magnesium (mg/dL)  Date Value  16/10/9602 1.8   Phosphorus (mg/dL)  Date Value  54/09/8117 3.7   Calcium (mg/dL)  Date Value  14/78/2956 8.8 (L)   Calcium, Total (mg/dL)  Date Value  21/30/8657 8.7   Albumin (g/dL)  Date Value  84/69/6295 2.4 (L)  07/04/2013 3.6    Assessment/Plan: 1. Electrolytes:  Potassium was 3.6 this morning. Will order KCl 40 mEq PO x1. Will monitor sodium; patient receiving 200 mL free water flushes q4h. Patient is also receiving lasix 40 mg IV BID.  2. Glucose mangagment: Levemir 10 units BID and SSI Q4hr. Patient receiving continuous tube feeds. Glucose <180 over past 24 hours. Will continue to monitor and adjust per consult.   3. Constipation Management: Last BM 10/13. Will follow up on rounds on Monday.  Continue daily Miralax and Senokot S BID.   Pharmacy will continue to monitor and adjust per consult.   Marty Heck, PharmD, BCPS 10/19/2017 8:39 AM

## 2017-10-20 ENCOUNTER — Inpatient Hospital Stay: Payer: Medicare Other | Admitting: Anesthesiology

## 2017-10-20 ENCOUNTER — Encounter
Admission: EM | Disposition: A | Discharge status: Nursing Home (Includes ICF) | Payer: Self-pay | Source: Skilled Nursing Facility | Attending: Internal Medicine

## 2017-10-20 DIAGNOSIS — J189 Pneumonia, unspecified organism: Secondary | ICD-10-CM

## 2017-10-20 DIAGNOSIS — I509 Heart failure, unspecified: Secondary | ICD-10-CM

## 2017-10-20 HISTORY — PX: TRACHEOSTOMY TUBE PLACEMENT: SHX814

## 2017-10-20 LAB — BASIC METABOLIC PANEL
ANION GAP: 7 (ref 5–15)
BUN: 41 mg/dL — ABNORMAL HIGH (ref 8–23)
CALCIUM: 8.7 mg/dL — AB (ref 8.9–10.3)
CO2: 32 mmol/L (ref 22–32)
CREATININE: 0.74 mg/dL (ref 0.61–1.24)
Chloride: 103 mmol/L (ref 98–111)
GFR calc non Af Amer: >60 mL/min (ref >60)
Glucose, Bld: 112 mg/dL — ABNORMAL HIGH (ref 70–99)
Potassium: 3.6 mmol/L (ref 3.5–5.1)
SODIUM: 142 mmol/L (ref 135–145)

## 2017-10-20 LAB — CBC WITH DIFFERENTIAL/PLATELET
Abs Immature Granulocytes: 0.01 10*3/uL (ref 0.00–0.07)
BASOS ABS: 0.1 10*3/uL (ref 0.0–0.1)
BASOS PCT: 2 %
EOS ABS: 0.6 10*3/uL — AB (ref 0.0–0.5)
EOS PCT: 12 %
HCT: 27.9 % — ABNORMAL LOW (ref 39.0–52.0)
HEMOGLOBIN: 8.3 g/dL — AB (ref 13.0–17.0)
Immature Granulocytes: 0 %
LYMPHS PCT: 18 %
Lymphs Abs: 1 10*3/uL (ref 0.7–4.0)
MCH: 25.3 pg — AB (ref 26.0–34.0)
MCHC: 29.7 g/dL — AB (ref 30.0–36.0)
MCV: 85.1 fL (ref 80.0–100.0)
MONO ABS: 0.5 10*3/uL (ref 0.1–1.0)
Monocytes Relative: 9 %
NRBC: 0 % (ref 0.0–0.2)
Neutro Abs: 3.1 10*3/uL (ref 1.7–7.7)
Neutrophils Relative %: 59 %
PLATELETS: 316 10*3/uL (ref 150–400)
RBC: 3.28 MIL/uL — AB (ref 4.22–5.81)
RDW: 17.1 % — AB (ref 11.5–15.5)
WBC: 5.2 10*3/uL (ref 4.0–10.5)

## 2017-10-20 LAB — GLUCOSE, CAPILLARY
GLUCOSE-CAPILLARY: 98 mg/dL (ref 70–99)
GLUCOSE-CAPILLARY: 115 mg/dL — AB (ref 70–99)
GLUCOSE-CAPILLARY: 119 mg/dL — AB (ref 70–99)
Glucose-Capillary: 108 mg/dL — ABNORMAL HIGH (ref 70–99)
Glucose-Capillary: 133 mg/dL — ABNORMAL HIGH (ref 70–99)
Glucose-Capillary: 116 mg/dL — ABNORMAL HIGH (ref 70–99)

## 2017-10-20 LAB — MAGNESIUM: MAGNESIUM: 1.7 mg/dL (ref 1.7–2.4)

## 2017-10-20 SURGERY — CREATION, TRACHEOSTOMY
Anesthesia: General

## 2017-10-20 MED ORDER — MIDAZOLAM HCL 2 MG/2ML IJ SOLN
INTRAMUSCULAR | Status: AC
  Filled 2017-10-20: qty 2

## 2017-10-20 MED ORDER — MIDAZOLAM HCL 2 MG/2ML IJ SOLN
INTRAMUSCULAR | Status: DC | PRN
  Administered 2017-10-20: 1 mg via INTRAVENOUS

## 2017-10-20 MED ORDER — ROCURONIUM BROMIDE 50 MG/5ML IV SOLN
INTRAVENOUS | Status: AC
  Filled 2017-10-20: qty 1

## 2017-10-20 MED ORDER — ATROPINE SULFATE 0.4 MG/ML IJ SOLN
INTRAMUSCULAR | Status: AC
  Filled 2017-10-20: qty 1

## 2017-10-20 MED ORDER — MAGNESIUM SULFATE 2 GM/50ML IV SOLN
2.0000 g | Freq: Once | INTRAVENOUS | Status: AC
  Administered 2017-10-20: 2 g via INTRAVENOUS
  Filled 2017-10-20: qty 50

## 2017-10-20 MED ORDER — ROCURONIUM BROMIDE 100 MG/10ML IV SOLN
INTRAVENOUS | Status: DC | PRN
  Administered 2017-10-20: 20 mg via INTRAVENOUS

## 2017-10-20 MED ORDER — FENTANYL CITRATE (PF) 100 MCG/2ML IJ SOLN
INTRAMUSCULAR | Status: AC
  Filled 2017-10-20: qty 2

## 2017-10-20 MED ORDER — POTASSIUM CHLORIDE 20 MEQ PO PACK
40.0000 meq | PACK | Freq: Once | ORAL | Status: AC
  Administered 2017-10-20: 40 meq
  Filled 2017-10-20: qty 2

## 2017-10-20 MED ORDER — VALPROIC ACID 250 MG/5ML PO SOLN
250.0000 mg | Freq: Three times a day (TID) | ORAL | Status: DC
  Administered 2017-10-20 – 2017-10-22 (×6): 250 mg
  Filled 2017-10-20 (×8): qty 5

## 2017-10-20 MED ORDER — EPHEDRINE SULFATE 50 MG/ML IJ SOLN
INTRAMUSCULAR | Status: AC
  Filled 2017-10-20: qty 1

## 2017-10-20 MED ORDER — LIDOCAINE-EPINEPHRINE 1 %-1:100000 IJ SOLN
INTRAMUSCULAR | Status: DC | PRN
  Administered 2017-10-20: 9 mL

## 2017-10-20 MED ORDER — LIDOCAINE HCL (PF) 2 % IJ SOLN
INTRAMUSCULAR | Status: AC
  Filled 2017-10-20: qty 10

## 2017-10-20 SURGICAL SUPPLY — 32 items
BLADE SURG 15 STRL LF DISP TIS (BLADE) ×1 IMPLANT
BLADE SURG 15 STRL SS (BLADE) ×2
BLADE SURG SZ11 CARB STEEL (BLADE) ×3 IMPLANT
CANISTER SUCT 1200ML W/VALVE (MISCELLANEOUS) ×3 IMPLANT
CORD BIP STRL DISP 12FT (MISCELLANEOUS) ×3 IMPLANT
COVER WAND RF STERILE (DRAPES) ×3 IMPLANT
ELECT REM PT RETURN 9FT ADLT (ELECTROSURGICAL) ×3
ELECTRODE REM PT RTRN 9FT ADLT (ELECTROSURGICAL) ×1 IMPLANT
FORCEPS JEWEL BIP 4-3/4 STR (INSTRUMENTS) ×3 IMPLANT
GAUZE PACKING IODOFORM 1/2 (PACKING) IMPLANT
GLOVE BIO SURGEON STRL SZ7.5 (GLOVE) ×9 IMPLANT
GOWN STRL REUS W/ TWL LRG LVL3 (GOWN DISPOSABLE) ×3 IMPLANT
GOWN STRL REUS W/TWL LRG LVL3 (GOWN DISPOSABLE) ×6
HEMOSTAT SURGICEL 2X3 (HEMOSTASIS) ×3 IMPLANT
HLDR TRACH TUBE NECKBAND 18 (MISCELLANEOUS) ×1 IMPLANT
HOLDER TRACH TUBE NECKBAND 18 (MISCELLANEOUS) ×2
KIT TURNOVER KIT A (KITS) ×3 IMPLANT
LABEL OR SOLS (LABEL) ×3 IMPLANT
NS IRRIG 500ML POUR BTL (IV SOLUTION) ×3 IMPLANT
PACK HEAD/NECK (MISCELLANEOUS) ×3 IMPLANT
SHEARS HARMONIC 9CM CVD (BLADE) ×3 IMPLANT
SPONGE DRAIN TRACH 4X4 STRL 2S (GAUZE/BANDAGES/DRESSINGS) ×3 IMPLANT
SPONGE KITTNER 5P (MISCELLANEOUS) ×3 IMPLANT
SUCTION FRAZIER HANDLE 10FR (MISCELLANEOUS) ×2
SUCTION TUBE FRAZIER 10FR DISP (MISCELLANEOUS) ×1 IMPLANT
SUT SILK 2 0 (SUTURE) ×2
SUT SILK 2 0 SH (SUTURE) ×6 IMPLANT
SUT SILK 2-0 18XBRD TIE 12 (SUTURE) ×1 IMPLANT
SUT VIC AB 3-0 PS2 18 (SUTURE) ×3 IMPLANT
SYR 3ML LL SCALE MARK (SYRINGE) ×3 IMPLANT
TUBE TRACH SHILEY  6 DIST  CUF (TUBING) IMPLANT
TUBE TRACH SHILEY 8 DIST CUF (TUBING) ×3 IMPLANT

## 2017-10-20 NOTE — Progress Notes (Signed)
Patient ID: Billy Liu, male   DOB: December 28, 1932, 84 y.o.   MRN: [id number]  [facility] PROGRESS NOTE  Billy Liu UVJ:[id number] DOB: [birth year] DOA: 10/06/2017 PCP: Alvester Morin, MD  HPI/Subjective: Tracheostomy today  Objective: Vitals:   10/20/17 0600 10/20/17 0700  BP: (!) 135/55   Pulse: (!) 59   Resp: 16   Temp:  97.9 F (36.6 C)  SpO2: 100%     Filed Weights   10/18/17 0500 10/19/17 0356 10/20/17 0500  Weight: 127.6 kg 124.4 kg 124.4 kg    ROS: Review of Systems  Unable to perform ROS: Acuity of condition   Exam: Physical Exam  Constitutional: He is intubated.  HENT:  Nose: Mucosal edema present.  Unable to look into mouth  Eyes: Conjunctivae and lids are normal.  Pupils pinpoint  Neck: Carotid bruit is not present.  Cardiovascular: Regular rhythm, S1 normal, S2 normal and normal heart sounds.  Respiratory: He is intubated. He has decreased breath sounds in the right lower field and the left lower field. He has no wheezes. He has rhonchi in the right lower field and the left lower field.  GI: Bowel sounds are normal. There is no tenderness.  Musculoskeletal:       Right ankle: He exhibits swelling.       Left ankle: He exhibits swelling.  Skin: Skin is warm.      Data Reviewed: Basic Metabolic Panel: Recent Labs  Lab 10/16/17 0304 10/17/17 0307 10/18/17 0516 10/19/17 0423 10/20/17 0320  NA 149* 147* 144 143 142  K 4.3 4.1 4.1 3.6 3.6  CL 115* 112* 107 105 103  CO2 '30 31 31 31 ' 32  GLUCOSE 151* 152* 138* 136* 112*  BUN 44* 46* 38* 39* 41*  CREATININE 0.85 0.88 0.77 0.81 0.74  CALCIUM 8.7* 8.6* 8.8* 8.8* 8.7*  MG  --   --  1.8  --  1.7  PHOS  --   --  3.7  --   --    Liver Function Tests: Recent Labs  Lab 10/13/17 0904 10/16/17 0715  AST 18  --   ALT 17  --   ALKPHOS 60  --   BILITOT 0.3  --   PROT 6.4*  --   ALBUMIN 2.4* 2.4*   CBC: Recent Labs  Lab 10/13/17 0904  10/16/17 0304 10/17/17 0307  10/18/17 0516 10/19/17 0423 10/20/17 0320  WBC 6.1   < > 5.0 5.2 5.2 5.1 5.2  NEUTROABS 4.4  --  3.4  --   --  3.0 3.1  HGB 8.8*   < > 7.7* 8.0* 8.4* 8.9* 8.3*  HCT 28.0*   < > 26.7* 27.4* 28.0* 30.3* 27.9*  MCV 86.1   < > 90.2 89.0 86.2 87.3 85.1  PLT 249   < > 222 234 258 305 316   < > = values in this interval not displayed.   Cardiac Enzymes: No results for input(s): CKTOTAL, CKMB, CKMBINDEX, TROPONINI in the last 168 hours.  CBG: Recent Labs  Lab 10/19/17 1533 10/19/17 1939 10/19/17 2318 10/20/17 0354 10/20/17 0700  GLUCAP 150* 133* 130* 98 108*    Recent Results (from the past 240 hour(s))  Culture, respiratory (non-expectorated)     Status: None   Collection Time: 10/13/17 12:50 PM  Result Value Ref Range Status   Specimen Description   Final    TRACHEAL ASPIRATE Performed at [facility], [address] [place], [place] [address]    Special Requests  Final    NONE Performed at [facility], [place], [place] [address]    Gram Stain   Final    FEW WBC PRESENT, PREDOMINANTLY PMN MODERATE GRAM POSITIVE RODS FEW GRAM VARIABLE ROD FEW GRAM NEGATIVE RODS RARE GRAM POSITIVE COCCI    Culture   Final    Consistent with normal respiratory flora. Performed at [facility], [place] [address], [place], [place] [address]    Report Status 10/15/2017 FINAL  Final     Studies: Dg Chest Port 1 View  Result Date: 10/19/2017 CLINICAL DATA:  Acute respiratory failure EXAM: PORTABLE CHEST 1 VIEW COMPARISON:  10/16/2017 FINDINGS: Cardiac shadow is mildly enlarged but stable. Endotracheal tube and nasogastric catheter are noted in satisfactory position. Bilateral pleural effusions are noted right greater than left stable from the prior exam. The degree of vascular congestion is stable. No new focal abnormality is noted. IMPRESSION: Bilateral pleural effusions and stable mild CHF. Electronically Signed   By: Inez Catalina M.D.   On:  10/19/2017 12:23    Scheduled Meds: . [MAR Hold] ALPRAZolam  0.5 mg Oral TID  . [MAR Hold] chlorhexidine gluconate (MEDLINE KIT)  15 mL Mouth Rinse BID  . [MAR Hold] famotidine  20 mg Per Tube BID  . [MAR Hold] feeding supplement (PRO-STAT SUGAR FREE 64)  60 mL Per Tube TID  . [MAR Hold] free water  200 mL Per Tube Q4H  . [MAR Hold] furosemide  40 mg Intravenous BID  . [MAR Hold] heparin  5,000 Units Subcutaneous Q8H  . [MAR Hold] Influenza vac split quadrivalent PF  0.5 mL Intramuscular Tomorrow-1000  . [MAR Hold] insulin aspart  0-20 Units Subcutaneous Q4H  . [MAR Hold] insulin detemir  10 Units Subcutaneous BID  . [MAR Hold] mouth rinse  15 mL Mouth Rinse 10 times per day  . [MAR Hold] multivitamin  15 mL Per Tube Daily  . [MAR Hold] polyethylene glycol  17 g Per Tube Daily  . [MAR Hold] potassium chloride  40 mEq Per Tube Once  . [MAR Hold] senna-docusate  2 tablet Per Tube BID   Continuous Infusions: . [MAR Hold] sodium chloride Stopped (10/19/17 1356)  . feeding supplement (VITAL HIGH PROTEIN) Stopped (10/20/17 0006)  . fentaNYL infusion INTRAVENOUS 300 mcg/hr (10/20/17 0750)  . propofol (DIPRIVAN) infusion 40 mcg/kg/min (10/20/17 0750)  . [MAR Hold] valproate sodium Stopped (10/20/17 0458)    Assessment/Plan:  1. Cardiac arrest.  Patient underwent hypothermia protocol.  Continue supportive care.  MRI shows old stroke neurology saw the patient, supportive care, patient seen by neurology, had EEG, showed background slowing. 2. ventilator dependent respiratory failure,  trach  today, continue antibiotics for pneumonia 3.  right lower extremity ulceration with foul-smelling discharge, left first toe ulceration and swelling of the first toe likely chronic infection.  Continue IV vancomycin, meropenem.  Wound cultures are positive for MRSA 4. Acute kidney injur y and hyperkalemia.   This has improved. 5. History of chronic diastolic congestive heart failure, seen by cardiology.  W  Lasix 40 IV twice daily. 6. Overall prognosis poor and high risk for cardiopulmonary arrest.  PEG tube trach per family, seen by ENT. 7. Anemia.  Responded to transfusion.   #8 diabetes mellitus type 2: Continue tube feeding, Levemir 10 units twice daily.  9,History of dementia, patient is on Depakote   10. acute encephalopathy, agitation, delirium, dementia, depression, anxiety: Patient is on fentanyl, Versed. . High risk for cardiac arrest.  Condition critical .family wants full code.  She is scheduled for tracheostomy today Disposition possible transfer to [facility] after trach and PEG Code Status:     Code Status Orders  (From admission, onward)         Start     Ordered   10/07/17 0132  Full code  Continuous     10/07/17 0131        Code Status History    Date Active Date Inactive Code Status Order ID Comments User Context   10/07/2017 0031 10/07/2017 0131 Full Code [id number]  Awilda Bill, NP ED   06/17/2017 1856 06/23/2017 2244 Full Code [id number]  Nicholes Mango, MD Inpatient   02/18/2017 1623 02/18/2017 2133 Full Code [id number]  Delana Meyer, Dolores Lory, MD Inpatient   02/11/2017 1138 02/11/2017 1736 Full Code [id number]  Schnier, Dolores Lory, MD Inpatient   10/24/2014 1934 10/31/2014 1837 Full Code [id number]  Fritzi Mandes, MD Inpatient   10/11/2014 2347 10/17/2014 1844 Full Code [id number]  Lance Coon, MD Inpatient     Family Communication: As per critical care team Disposition Plan: To be determined  Consultants:  Critical care specialist  Antibiotics:  Meropenem  Time spent: 38 minutes [place]

## 2017-10-20 NOTE — Transfer of Care (Signed)
Immediate Anesthesia Transfer of Care Note  Patient: Billy Liu  Procedure(s) Performed: TRACHEOSTOMY (N/A )  Patient Location: PACU  Anesthesia Type:General  Level of Consciousness: sedated and Patient remains intubated per anesthesia plan  Airway & Oxygen Therapy: Patient Spontanous Breathing and Patient placed on Ventilator (see vital sign flow sheet for setting)  Post-op Assessment: Report given to RN and Post -op Vital signs reviewed and stable  Post vital signs: Reviewed and stable  Last Vitals:  Vitals Value Taken Time  BP    Temp    Pulse    Resp    SpO2      Last Pain:  Vitals:   10/20/17 0700  TempSrc: Axillary  PainSc:          Complications: No apparent anesthesia complications

## 2017-10-20 NOTE — Anesthesia Procedure Notes (Signed)
Procedure Name: Intubation Date/Time: 10/20/2017 8:09 AM Performed by: Henrietta Hoover, CRNA Pre-anesthesia Checklist: Patient identified, Patient being monitored, Timeout performed, Emergency Drugs available and Suction available Patient Re-evaluated:Patient Re-evaluated prior to induction Oxygen Delivery Method: Circle system utilized Preoxygenation: Pre-oxygenation with 100% oxygen Induction Type: IV induction Ventilation: Mask ventilation without difficulty Grade View: Grade I Tube type: Oral Tube size: 7.5 mm Number of attempts: 1 Airway Equipment and Method: Stylet Placement Confirmation: ETT inserted through vocal cords under direct vision,  positive ETCO2 and breath sounds checked- equal and bilateral Secured at: 21 cm Tube secured with: Tape Dental Injury: Teeth and Oropharynx as per pre-operative assessment  Comments: Came from ICU with existing oral ETT

## 2017-10-20 NOTE — Progress Notes (Signed)
Bath given to patient.  Tube feed held at midnight.  Will hold am dose of heparin per MD orders.

## 2017-10-20 NOTE — Plan of Care (Signed)
Patient remains intubated.  CHG wipedown completed at 6:30 am.  Tube feeds held since midnight.  Report given to OR staff.  Patient awaiting transport to OR for tracheostomy.  Consent signed. Report given to oncoming nurse.  No signs and symptoms of distress noted.

## 2017-10-20 NOTE — Care Management (Signed)
[facility] can make LTACH bed offer pending insurance authorization.

## 2017-10-20 NOTE — Op Note (Signed)
..  10/20/2017 8:31 AM  Feliz Beam [id number]  Pre-Op Dx: respiratory failure  Post-Op Dx:  same  Proc:  Tracheostomy  Surg:  Irianna Gilday  Assist:  Juengel  Anes:  GOT  EBL:  <36ml  Comp:  none  Findings:  Size 8 Cuffed Shiley placed through tracheal ring 2-3 interspace  Procedure:  The patient was brought from the intensive care unit to the operating room and transferred to an operating table.  Anesthesia was administered per indwelling orotracheal tube.   Neck extension was achieved as possible anda shoulder rolke was placed.  The lower neck was palpated with the findings as described above.  1% Xylocaine with 1:100,000 epinephrine, 9 cc's, was infiltrated into the surgical field for intraoperative hemostasis.  Several minutes were allowed for this to take effect. The patient was prepped in a sterile fashion with a surgical prep from the chin down to the upper chest.  Sterile draping was accomplished in the standard fashion.  A  5 cm horizontal incision was made sharply a finger's breadth above the sternal notch, and extended through skin and subcutaneous fat.  Using cautery, the superficial layer of the deep cervical fascia was lysed.  Additional dissection revealed the strap muscles.  The midline raphe was divided in two layers and the muscles retracted laterally.  The pretracheal plane was visualized.  This was entered bluntly.  The thyroid isthmus was isolated and divided with the Harmonic scalpel.  The thyroid gland was retracted to either side.  The anterior face of the trachea was cleared.  In the  2nd interspace, a transverse incision was made between cartilage rings into the tracheal lumen.  A 6 mm wide inferiorly based flap was generated and secured to the lower wound with a 4-0 vicryl suture.   A previously tested  # 8 Shiley cuffed tracheostomy tube was brought into the field.  With the endotracheal tube under direct visualization through the tracheostomy, it was  gently backed up.  The tracheostomy tube was inserted into the tracheal lumen.  Hemostasis was observed. The cuff was inflated and observed to be intact and containing pressure. The inner cannula was placed and ventilation assumed per tracheostomy tube.  Good chest wall motion was observed, and CO2 was documented per anesthesia.  The trach tube was secured in the standard fashion with trach ties. A 2-0 Silk suture was used to secure the trach tube to the skin on both sides.  Hemostasis was observed again.  When satisfactory ventilation was assured, the orotracheal tube was removed.  At this point the procedure was completed.  The patient was returned to anesthesia, awakened as possible, and transferred back to the intensive care unit in stable condition.  Comment: 84 y.o. male with prolonged ventilation was the indication for today's procedure.  Anticipate a routine postoperative recovery including standard tracheal hygiene.  The sutures should be removed in 5 days.  When the patient no longer requires ventilator or pressure support, the cuff should be deflated.  Changing to an uncuffed tube and downsizing will be according to the clinical condition of the patient.   Ramonte Mena  8:31 AM 10/20/2017

## 2017-10-20 NOTE — Progress Notes (Signed)
Daily Progress Note   Patient Name: Billy Liu       Date: 10/20/2017 DOB: Jun 15, 1932  Age: 84 y.o. MRN#: [id number] Attending Physician: Epifanio Lesches, MD Primary Care Physician: Alvester Morin, MD Admit Date: 10/06/2017  Reason for Consultation/Follow-up: Psychosocial/spiritual support  Subjective: Patient is resting in bed on ventilator with trach placed today. No distress noted.  No family at bedside today.  Plans for transfer to [facility].  Length of Stay: 13  Current Medications: Scheduled Meds:  . ALPRAZolam  0.5 mg Oral TID  . chlorhexidine gluconate (MEDLINE KIT)  15 mL Mouth Rinse BID  . famotidine  20 mg Per Tube BID  . feeding supplement (PRO-STAT SUGAR FREE 64)  60 mL Per Tube TID  . free water  200 mL Per Tube Q4H  . furosemide  40 mg Intravenous BID  . heparin  5,000 Units Subcutaneous Q8H  . Influenza vac split quadrivalent PF  0.5 mL Intramuscular Tomorrow-1000  . insulin aspart  0-20 Units Subcutaneous Q4H  . insulin detemir  10 Units Subcutaneous BID  . mouth rinse  15 mL Mouth Rinse 10 times per day  . multivitamin  15 mL Per Tube Daily  . polyethylene glycol  17 g Per Tube Daily  . senna-docusate  2 tablet Per Tube BID  . valproic acid  250 mg Per Tube Q8H    Continuous Infusions: . sodium chloride 0 mL/hr at 10/19/17 1356  . feeding supplement (VITAL HIGH PROTEIN) Stopped (10/20/17 0006)  . fentaNYL infusion INTRAVENOUS 30 mcg/hr (10/20/17 1032)  . propofol (DIPRIVAN) infusion 40 mcg/kg/min (10/20/17 1021)    PRN Meds: sodium chloride, acetaminophen **OR** acetaminophen, atropine, bisacodyl, fentaNYL, hydrALAZINE, midazolam, midazolam, ondansetron **OR** ondansetron (ZOFRAN) IV, traZODone  Physical Exam  Constitutional: No distress.    Pulmonary/Chest:  Trach            Vital Signs: BP (!) 135/56   Pulse (!) 58   Temp (!) 96 F (35.6 C) (Axillary)   Resp 16   Ht '6\' 3"'  (1.905 m)   Wt 124.4 kg   SpO2 100%   BMI 34.28 kg/m  SpO2: SpO2: 100 % O2 Device: O2 Device: Ventilator O2 Flow Rate: O2 Flow Rate (L/min): 30 L/min  Intake/output summary:   Intake/Output Summary (Last 24 hours) at 10/20/2017 1144 Last data filed  at 10/20/2017 1100 Gross per 24 hour  Intake 1610.39 ml  Output 4337 ml  Net -2726.61 ml   LBM: Last BM Date: 10/19/17 Baseline Weight: Weight: 129.5 kg(bed weight) Most recent weight: Weight: 124.4 kg       Palliative Assessment/Data: Billy Liu    Flowsheet Rows     Most Recent Value  Intake Tab  Referral Department  Hospitalist  Unit at Time of Referral  ICU  Palliative Care Primary Diagnosis  Cardiac  Date Notified  10/07/17  Palliative Care Type  New Palliative care  Reason for referral  Clarify Goals of Care  Date of Admission  10/06/17  Date first seen by Palliative Care  10/07/17  # of days Palliative referral response time  0 Day(s)  # of days IP prior to Palliative referral  1  Clinical Assessment  Psychosocial & Spiritual Assessment  Palliative Care Outcomes      Patient Active Problem List   Diagnosis Date Noted  . Cardiac arrest ([place]) 10/07/2017  . Iron deficiency anemia due to chronic blood loss   . Symptomatic anemia 06/17/2017  . Pressure injury of skin 06/17/2017  . Atherosclerosis of native arteries of extremity with intermittent claudication ([place]) 12/25/2016  . Chronic venous insufficiency 12/25/2016  . Lymphedema 10/23/2016  . Hyperlipidemia 10/23/2016  . Chronic diastolic heart failure ([place]) 11/10/2014  . Obstructive sleep apnea 11/10/2014  . CAD (coronary artery disease) 10/11/2014  . COPD (chronic obstructive pulmonary disease) ([place]) 10/11/2014  . Type 2 diabetes mellitus ([place]) 10/11/2014  . HTN (hypertension) 10/11/2014  . GERD (gastroesophageal  reflux disease) 10/11/2014  . CKD (chronic kidney disease), stage III ([place]) 10/11/2014    Palliative Care Assessment & Plan    Recommendations/Plan:  Plans to D/C to [facility]. Recommend palliative to follow at the facility.      Code Status:    Code Status Orders  (From admission, onward)         Start     Ordered   10/07/17 0132  Full code  Continuous     10/07/17 0131        Code Status History    Date Active Date Inactive Code Status Order ID Comments User Context   10/07/2017 0031 10/07/2017 0131 Full Code [id number]  Awilda Bill, NP ED   06/17/2017 1856 06/23/2017 2244 Full Code [id number]  Nicholes Mango, MD Inpatient   02/18/2017 1623 02/18/2017 2133 Full Code [id number]  Delana Meyer, Dolores Lory, MD Inpatient   02/11/2017 1138 02/11/2017 1736 Full Code [id number]  Schnier, Dolores Lory, MD Inpatient   10/24/2014 1934 10/31/2014 1837 Full Code [id number]  Fritzi Mandes, MD Inpatient   10/11/2014 2347 10/17/2014 1844 Full Code [id number]  Lance Coon, MD Inpatient       Prognosis:   Unable to determine  Discharge Planning:  To Be Determined    Thank you for allowing the Palliative Medicine Team to assist in the care of this patient.   Total Time 15 min Prolonged Time Billed  no      Greater than 50%  of this time was spent counseling and coordinating care related to the above assessment and plan.  Asencion Gowda, NP  Please contact Palliative Medicine Team phone at [phone] for questions and concerns.

## 2017-10-20 NOTE — Anesthesia Preprocedure Evaluation (Addendum)
Anesthesia Evaluation  Patient identified by MRN, date of birth, ID band Patient unresponsive  General Assessment Comment:Hx reviewed with brother, pt intubated and sedated   Reviewed: Allergy & Precautions, NPO status , Patient's Chart, lab work & pertinent test results  History of Anesthesia Complications (+) DIFFICULT AIRWAY and history of anesthetic complications  Airway Mallampati: Intubated       Dental  (+) Poor Dentition   Pulmonary sleep apnea , COPD,    breath sounds clear to auscultation- rhonchi (-) wheezing      Cardiovascular hypertension, + CAD   Rhythm:Regular Rate:Normal - Systolic murmurs and - Diastolic murmurs    Neuro/Psych PSYCHIATRIC DISORDERS Anxiety Depression Dementia CVA    GI/Hepatic GERD  ,  Endo/Other  diabetes  Renal/GU Renal InsufficiencyRenal disease     Musculoskeletal   Abdominal (+) + obese,   Peds  Hematology  (+) anemia ,   Anesthesia Other Findings Past Medical History: No date: Anxiety 10/17/2014: Cellulitis     Comment:  left lower leg No date: Chronic diastolic CHF (congestive heart failure) (HCC) No date: CKD (chronic kidney disease), stage III (HCC) No date: COPD (chronic obstructive pulmonary disease) (HCC) No date: Coronary artery disease No date: Dementia (HCC)     Comment:  Alzheimer's  No date: Depression No date: Diabetes mellitus without complication (HCC) No date: GERD (gastroesophageal reflux disease) No date: Hyperlipemia No date: Hypertension No date: Obesity No date: Renal disorder   Reproductive/Obstetrics                             Anesthesia Physical Anesthesia Plan  ASA: IV  Anesthesia Plan: General   Post-op Pain Management:    Induction: Intravenous  PONV Risk Score and Plan: 1 and Ondansetron  Airway Management Planned: Oral ETT and Tracheostomy  Additional Equipment:   Intra-op Plan:    Post-operative Plan: Extubation in OR  Informed Consent: I have reviewed the patients History and Physical, chart, labs and discussed the procedure including the risks, benefits and alternatives for the proposed anesthesia with the patient or authorized representative who has indicated his/her understanding and acceptance.   Dental advisory given and Consent reviewed with POA (anesthesia plan discussed with pt's brother who is his POA)  Plan Discussed with: CRNA and Anesthesiologist  Anesthesia Plan Comments:        Anesthesia Quick Evaluation

## 2017-10-20 NOTE — Consult Note (Signed)
Pharmacy Electrolyte Monitoring Consult:  Pharmacy consulted to assist in monitoring and replacing electrolytes in this 84 y.o. male admitted on 10/06/2017 with Cardiac Arrest   Labs:  Sodium (mmol/L)  Date Value  10/20/2017 142  07/04/2013 133 (L)   Potassium (mmol/L)  Date Value  10/20/2017 3.6  07/04/2013 4.3   Magnesium (mg/dL)  Date Value  16/10/9602 1.7   Phosphorus (mg/dL)  Date Value  54/09/8117 3.7   Calcium (mg/dL)  Date Value  14/78/2956 8.7 (L)   Calcium, Total (mg/dL)  Date Value  21/30/8657 8.7   Albumin (g/dL)  Date Value  84/69/6295 2.4 (L)  07/04/2013 3.6    Assessment/Plan: 1. Electrolytes: Potassium was 3.6 this morning, and patient received KCl 40 mEq PO x1 dose. Mg was 1.7 this morning, and patient received IV Magensium 2g x1 dose. Will monitor sodium; patient receiving 200 mL free water flushes q4h. Patient is also receiving lasix 40 mg IV BID.  2. Glucose mangagment: Levemir 10 units BID and SSI Q4hr. Patient receiving continuous tube feeds. Glucose 98-160 over past 24 hours. Will continue to monitor and adjust per consult.   3. Constipation Management: Last BM 10/13. Continue daily Miralax and Senokot S BID.   Pharmacy will continue to monitor and adjust per consult.   Broadus John, PharmD Candidate  10/20/2017 3:55 PM

## 2017-10-20 NOTE — Anesthesia Post-op Follow-up Note (Signed)
Anesthesia QCDR form completed.        

## 2017-10-20 NOTE — Care Management (Signed)
RNCM met with brother's [name] and Juanda Crumble at patient's bedside to offer choice of LTACH ([facility] or [facility]).  [name] wishes for patient to go to [facility]. Trach placed today. Doroteo Bradford with [facility] will start authorization with [facility].

## 2017-10-20 NOTE — Plan of Care (Signed)
Patient had a size 8 cuffed Shiley trach placed this morning.  Family (brothers) visited this morning.  Patient is sedated and resting.  Uneventful day.

## 2017-10-20 NOTE — Progress Notes (Signed)
   Subjective:    Patient ID: Billy Liu, male    DOB: 1932-06-28, 84 y.o.   MRN: [id number]  HPI Back  from tracheostomy. Tolerated the procedure well. Still sedated from surgery. Synchronous with a ventilator.   Review of Systems  Unable to perform ROS: Acuity of condition   Ventilator Settings: Vent Mode: PRVC FiO2 (%):  [30 %] 30 % Set Rate:  [16 bmp] 16 bmp Vt Set:  [500 mL] 500 mL PEEP:  [5 cmH20] 5 cmH20 Plateau Pressure:  [19 cmH20-20 cmH20] 19 cmH20   Intake/Output: Total I/O In: -  Out: 250 [Urine:250]   Vitals: Vitals:   10/20/17 1800 10/20/17 1953  BP: (!) 136/57   Pulse: 66   Resp: 16   Temp: 97.9 F (36.6 C)   SpO2: 99% 99%       Objective:   Physical Exam  GENERAL:critically ill appearing, tracheostomy in place  HEAD: Normocephalic, atraumatic.  EYES: Pupils equal, round, reactive to light.  No scleral icterus.  MOUTH: Moist mucosal membrane. NECK: Supple. No thyromegaly. No nodules. No JVD.  PULMONARY:CTA with decreased breath sounds at bases without rhonchi, rales or wheezing CARDIOVASCULAR: S1 and S2. Regular rate and rhythm. No murmurs, rubs, or gallops.  GASTROINTESTINAL: Soft, somewhat distended.Positive bowel sounds. No hepatosplenomegaly.  MUSCULOSKELETAL: No swelling, clubbing, or edema.  NEUROLOGIC: sedated  SKIN:intact,warm,dry  MAR reviewed with Pharm.D. Laboratory data reviewed independently. Chest x-ray after tracheostomy shows tracheostomy and satisfactory position.    Assessment & Plan:  Acute respiratory failure secondary to LLL pneumonia and pulmonary edema  Mechanical intubation s/p cardiac arrest  Hx: COPD and Obesity  Full vent support-vent Prn bronchodilator therapy  VAP bundle implemented Had tracheostomy placed today. Discussed transfer to [facility] during multidisciplinary rounds.  Acute on chronic diastolic CHF Cardiac Arrest (cardiac rhythm unknown)-hypothermia protocol completed Bradycardia-resolved Hx:  HTN, CAD, and Hyperlipidemia  Continuous telemetry monitoring  Maintain map >65 IV lasix as bp permits40 mg bid Prn hydralazine for bp control Cardiology following  Acute on chronic renal failure with hyperkalemia-resolved Metabolic acidosis-resolved Trend BMP Replace electrolytes as indicated  Monitor UOP  Avoid nephrotoxic medications   LLL pneumonia  Diabetic Ulcers Bilateral Lower Extremities Trend WBC and monitor fever curve  Polymicrobial diabetic ulcers, MRSA included. Sputum culture negative for predominant organism. Continuemeropenum/vanco  Anemia without obvious acute blood loss  Trend CBC  Monitor for s/sx of bleeding  Transfuse for hgb <7 VTE YQ:MVHQ heparin   Nutrition Constipation  SUP px: pepcid Continue TF's  Continue bowel regimen  Hyperglycemia CBG's q4hrs SSI  Acute encephalopathy Agitation/Delirium Hx: Dementia, Depression, and Anxiety Maintain RASS goal 0 to -1 Fentanyl gtt and prn versed to maintain RASS goal  Continue xanax   Multidisciplinary rounds were performed with Pharm.D., RD, RT, case management and bedside nurse. Patient continues to be critically ill and prognosis long-term is guarded. He will need LTACH for long-term weaning.  Total critical care time 35 minutes.

## 2017-10-21 ENCOUNTER — Encounter: Payer: Self-pay | Admitting: Otolaryngology

## 2017-10-21 ENCOUNTER — Inpatient Hospital Stay: Payer: Medicare Other

## 2017-10-21 DIAGNOSIS — Z4659 Encounter for fitting and adjustment of other gastrointestinal appliance and device: Secondary | ICD-10-CM

## 2017-10-21 LAB — CBC WITH DIFFERENTIAL/PLATELET
Abs Immature Granulocytes: 0.02 10*3/uL (ref 0.00–0.07)
BASOS ABS: 0.1 10*3/uL (ref 0.0–0.1)
BASOS PCT: 1 %
EOS ABS: 0.6 10*3/uL — AB (ref 0.0–0.5)
EOS PCT: 11 %
HCT: 30.8 % — ABNORMAL LOW (ref 39.0–52.0)
Hemoglobin: 9.1 g/dL — ABNORMAL LOW (ref 13.0–17.0)
Immature Granulocytes: 0 %
LYMPHS PCT: 17 %
Lymphs Abs: 1 10*3/uL (ref 0.7–4.0)
MCH: 25.6 pg — AB (ref 26.0–34.0)
MCHC: 29.5 g/dL — AB (ref 30.0–36.0)
MCV: 86.8 fL (ref 80.0–100.0)
Monocytes Absolute: 0.5 10*3/uL (ref 0.1–1.0)
Monocytes Relative: 9 %
NRBC: 0 % (ref 0.0–0.2)
Neutro Abs: 3.5 10*3/uL (ref 1.7–7.7)
Neutrophils Relative %: 62 %
Platelets: 368 10*3/uL (ref 150–400)
RBC: 3.55 MIL/uL — AB (ref 4.22–5.81)
RDW: 17.4 % — AB (ref 11.5–15.5)
WBC: 5.7 10*3/uL (ref 4.0–10.5)

## 2017-10-21 LAB — BASIC METABOLIC PANEL
Anion gap: 9 (ref 5–15)
BUN: 44 mg/dL — AB (ref 8–23)
CALCIUM: 8.7 mg/dL — AB (ref 8.9–10.3)
CO2: 31 mmol/L (ref 22–32)
CREATININE: 0.77 mg/dL (ref 0.61–1.24)
Chloride: 101 mmol/L (ref 98–111)
GFR calc Af Amer: >60 mL/min (ref >60)
GFR calc non Af Amer: >60 mL/min (ref >60)
Glucose, Bld: 133 mg/dL — ABNORMAL HIGH (ref 70–99)
Potassium: 3.5 mmol/L (ref 3.5–5.1)
SODIUM: 141 mmol/L (ref 135–145)

## 2017-10-21 LAB — GLUCOSE, CAPILLARY
GLUCOSE-CAPILLARY: 112 mg/dL — AB (ref 70–99)
GLUCOSE-CAPILLARY: 118 mg/dL — AB (ref 70–99)
GLUCOSE-CAPILLARY: 134 mg/dL — AB (ref 70–99)
Glucose-Capillary: 127 mg/dL — ABNORMAL HIGH (ref 70–99)
Glucose-Capillary: 145 mg/dL — ABNORMAL HIGH (ref 70–99)
Glucose-Capillary: 150 mg/dL — ABNORMAL HIGH (ref 70–99)
Glucose-Capillary: 180 mg/dL — ABNORMAL HIGH (ref 70–99)

## 2017-10-21 LAB — MAGNESIUM: Magnesium: 2.1 mg/dL (ref 1.7–2.4)

## 2017-10-21 MED ORDER — PRO-STAT SUGAR FREE PO LIQD
60.0000 mL | Freq: Four times a day (QID) | ORAL | Status: DC
  Administered 2017-10-21 – 2017-10-22 (×6): 60 mL

## 2017-10-21 MED ORDER — POTASSIUM CHLORIDE 10 MEQ/100ML IV SOLN
10.0000 meq | INTRAVENOUS | Status: AC
  Administered 2017-10-21 (×4): 10 meq via INTRAVENOUS
  Filled 2017-10-21 (×4): qty 100

## 2017-10-21 MED ORDER — FREE WATER
150.0000 mL | Status: DC
  Administered 2017-10-21 – 2017-10-23 (×9): 150 mL

## 2017-10-21 MED ORDER — VITAL HIGH PROTEIN PO LIQD
1000.0000 mL | ORAL | Status: DC
  Administered 2017-10-21 – 2017-10-22 (×2): 1000 mL

## 2017-10-21 MED ORDER — DEXMEDETOMIDINE HCL IN NACL 400 MCG/100ML IV SOLN
0.4000 ug/kg/h | INTRAVENOUS | Status: DC
  Administered 2017-10-21: 0.8 ug/kg/h via INTRAVENOUS
  Administered 2017-10-21: 0.4 ug/kg/h via INTRAVENOUS
  Administered 2017-10-21: 0.6 ug/kg/h via INTRAVENOUS
  Administered 2017-10-22 (×2): 0.8 ug/kg/h via INTRAVENOUS
  Administered 2017-10-22 – 2017-10-23 (×8): 1.2 ug/kg/h via INTRAVENOUS
  Administered 2017-10-23: 1 ug/kg/h via INTRAVENOUS
  Administered 2017-10-23 (×2): 1.2 ug/kg/h via INTRAVENOUS
  Administered 2017-10-23: 1 ug/kg/h via INTRAVENOUS
  Administered 2017-10-23 – 2017-10-25 (×15): 1.2 ug/kg/h via INTRAVENOUS
  Administered 2017-10-26: 0.4 ug/kg/h via INTRAVENOUS
  Administered 2017-10-26 (×2): 0.5 ug/kg/h via INTRAVENOUS
  Filled 2017-10-21 (×34): qty 100

## 2017-10-21 NOTE — Anesthesia Postprocedure Evaluation (Signed)
Anesthesia Post Note  Patient: Billy Liu  Procedure(s) Performed: TRACHEOSTOMY (N/A )  Patient location during evaluation: ICU Anesthesia Type: General Level of consciousness: awake and alert Pain management: pain level controlled Vital Signs Assessment: post-procedure vital signs reviewed and stable Respiratory status: spontaneous breathing, nonlabored ventilation, respiratory function stable and patient connected to nasal cannula oxygen Cardiovascular status: blood pressure returned to baseline and stable Postop Assessment: no apparent nausea or vomiting Anesthetic complications: no     Last Vitals:  Vitals:   10/21/17 0600 10/21/17 0700  BP: (!) 140/58 (!) 145/55  Pulse: 73 74  Resp: 16 16  Temp:    SpO2: 100% 100%    Last Pain:  Vitals:   10/21/17 0330  TempSrc: Axillary  PainSc:                  [name]

## 2017-10-21 NOTE — Progress Notes (Signed)
Patient ID: Billy Liu, male   DOB: 1932/05/02, 84 y.o.   MRN: [id number]  [employer] PROGRESS NOTE  Billy Liu QQV:[id number] DOB: 09-29-1932 DOA: 10/06/2017 PCP: Alvester Morin, MD  HPI/Subjective: Status post tracheostomy yesterday  Objective: Vitals:   10/21/17 1000 10/21/17 1100  BP: (!) 179/91 134/87  Pulse: 84 75  Resp: 19 13  Temp:    SpO2: 100% 100%    Filed Weights   10/19/17 0356 10/20/17 0500 10/21/17 0500  Weight: 124.4 kg 124.4 kg 121.1 kg    ROS: Review of Systems  Unable to perform ROS: Acuity of condition   Exam: Physical Exam  Constitutional: He is intubated.  HENT:  Nose: Mucosal edema present.  Unable to look into mouth  Eyes: Conjunctivae and lids are normal.  Pupils pinpoint  Neck: Carotid bruit is not present.  Cardiovascular: Regular rhythm, S1 normal, S2 normal and normal heart sounds.  Respiratory: He is intubated. He has decreased breath sounds in the right lower field and the left lower field. He has no wheezes. He has rhonchi in the right lower field and the left lower field.  GI: Bowel sounds are normal. There is no tenderness.  Musculoskeletal:       Right ankle: He exhibits swelling.       Left ankle: He exhibits swelling.  Skin: Skin is warm.      Data Reviewed: Basic Metabolic Panel: Recent Labs  Lab 10/17/17 0307 10/18/17 0516 10/19/17 0423 10/20/17 0320 10/21/17 0427  NA 147* 144 143 142 141  K 4.1 4.1 3.6 3.6 3.5  CL 112* 107 105 103 101  CO2 _0 32 31  GLUCOSE 152* 138* 136* 112* 133*  BUN 46* 38* 39* 41* 44*  CREATININE 0.88 0.77 0.81 0.74 0.77  CALCIUM 8.6* 8.8* 8.8* 8.7* 8.7*  MG  --  1.8  --  1.7 2.1  PHOS  --  3.7  --   --   --    Liver Function Tests: Recent Labs  Lab 10/16/17 0715  ALBUMIN 2.4*   CBC: Recent Labs  Lab 10/16/17 0304 10/17/17 0307 10/18/17 0516 10/19/17 0423 10/20/17 0320 10/21/17 0427  WBC 5.0 5.2 5.2 5.1 5.2 5.7  NEUTROABS 3.4  --   --  3.0 3.1  3.5  HGB 7.7* 8.0* 8.4* 8.9* 8.3* 9.1*  HCT 26.7* 27.4* 28.0* 30.3* 27.9* 30.8*  MCV 90.2 89.0 86.2 87.3 85.1 86.8  PLT 222 234 258 305 316 368   Cardiac Enzymes: No results for input(s): CKTOTAL, CKMB, CKMBINDEX, TROPONINI in the last 168 hours.  CBG: Recent Labs  Lab 10/20/17 1558 10/20/17 1919 10/21/17 0025 10/21/17 0507 10/21/17 0728  GLUCAP 116* 119* 134* 127* 112*    Recent Results (from the past 240 hour(s))  Culture, respiratory (non-expectorated)     Status: None   Collection Time: 10/13/17 12:50 PM  Result Value Ref Range Status   Specimen Description   Final    TRACHEAL ASPIRATE Performed at [facility], [address], [place], [place] [address]    Special Requests   Final    NONE Performed at [facility], [address] [place], [place] [address]    Gram Stain   Final    FEW WBC PRESENT, PREDOMINANTLY PMN MODERATE GRAM POSITIVE RODS FEW GRAM VARIABLE ROD FEW GRAM NEGATIVE RODS RARE GRAM POSITIVE COCCI    Culture   Final    Consistent with normal respiratory flora. Performed at [facility], [place]  [address] [place], [place] [address]    Report Status 10/15/2017 FINAL  Final     Studies: Dg Chest Port 1 View  Result Date: 10/21/2017 CLINICAL DATA:  Status post intubation. EXAM: PORTABLE CHEST 1 VIEW COMPARISON:  Radiograph of October 19, 2017. FINDINGS: Stable cardiomediastinal silhouette. Endotracheal tube has been removed. Tracheostomy tube is now seen in grossly good position. Nasogastric tube is unchanged in position. No pneumothorax is noted. Mild bibasilar subsegmental atelectasis is noted with associated pleural effusions. Bony thorax is unremarkable. IMPRESSION: Tracheostomy tube in grossly good position. Stable bibasilar subsegmental atelectasis with associated pleural effusions. Electronically Signed   By: Marijo Conception, M.D.   On: 10/21/2017 08:28   Dg Foot 2 Views Left  Result Date: 10/21/2017 CLINICAL DATA:   Gangrenous left foot. EXAM: LEFT FOOT - 2 VIEW COMPARISON:  None. FINDINGS: There is no evidence of fracture or dislocation. There is no evidence of arthropathy or other focal bone abnormality. Vascular calcifications are noted. No lytic destruction is seen to suggest osteomyelitis. Diffuse soft tissue swelling is noted. IMPRESSION: No significant bony abnormality is noted. Diffuse soft tissue swelling is noted suggesting inflammation or edema. Electronically Signed   By: Marijo Conception, M.D.   On: 10/21/2017 08:30    Scheduled Meds: . ALPRAZolam  0.5 mg Oral TID  . chlorhexidine gluconate (MEDLINE KIT)  15 mL Mouth Rinse BID  . famotidine  20 mg Per Tube BID  . feeding supplement (PRO-STAT SUGAR FREE 64)  60 mL Per Tube TID  . free water  200 mL Per Tube Q4H  . furosemide  40 mg Intravenous BID  . heparin  5,000 Units Subcutaneous Q8H  . Influenza vac split quadrivalent PF  0.5 mL Intramuscular Tomorrow-1000  . insulin aspart  0-20 Units Subcutaneous Q4H  . insulin detemir  10 Units Subcutaneous BID  . mouth rinse  15 mL Mouth Rinse 10 times per day  . multivitamin  15 mL Per Tube Daily  . valproic acid  250 mg Per Tube Q8H   Continuous Infusions: . sodium chloride 0 mL/hr at 10/19/17 1356  . dexmedetomidine (PRECEDEX) IV infusion 0.4 mcg/kg/hr (10/21/17 1043)  . feeding supplement (VITAL HIGH PROTEIN) 15 mL/hr at 10/21/17 0600  . fentaNYL infusion INTRAVENOUS 250 mcg/hr (10/21/17 1044)  . potassium chloride 10 mEq (10/21/17 0956)  . propofol (DIPRIVAN) infusion 50 mcg/kg/min (10/21/17 1041)    Assessment/Plan:  1. Cardiac arrest.  Patient underwent hypothermia protocol.  Continue supportive care.  MRI shows old stroke neurology saw the patient, supportive care, patient seen by neurology, had EEG, showed background slowing. 2. ventilator dependent respiratory failure,  trach  today, continue antibiotics for pneumonia 3.  right lower extremity ulceration with foul-smelling discharge,  left first toe ulceration and swelling of the first toe likely chronic infection.  Continue IV vancomycin, meropenem.  Wound cultures are positive for MRSA 4. Acute kidney injur y and hyperkalemia.   This has improved. 5. History of chronic diastolic congestive heart failure, seen by cardiology.  W Lasix 40 IV twice daily. 6. Overall prognosis poor and high risk for cardiopulmonary arrest.  PEG tube trach per family, seen by ENT. 7. Anemia.  Responded to transfusion.   #8 diabetes mellitus type 2: Continue tube feeding, Levemir 10 units twice daily.  9,History of dementia, patient is on Depakote   10. acute encephalopathy, agitation, delirium, dementia, depression, anxiety: Patient is on fentanyl, Versed. . High risk for cardiac arrest.  Condition critical .family wants full  code.  She is scheduled for tracheostomy today Possible transfer to [place], pending PEG placement. Code Status:     Code Status Orders  (From admission, onward)         Start     Ordered   10/07/17 0132  Full code  Continuous     10/07/17 0131        Code Status History    Date Active Date Inactive Code Status Order ID Comments User Context   10/07/2017 0031 10/07/2017 0131 Full Code [id number]  Awilda Bill, NP ED   06/17/2017 1856 06/23/2017 2244 Full Code [id number]  Nicholes Mango, MD Inpatient   02/18/2017 1623 02/18/2017 2133 Full Code [id number]  Delana Meyer, Dolores Lory, MD Inpatient   02/11/2017 1138 02/11/2017 1736 Full Code [id number]  Schnier, Dolores Lory, MD Inpatient   10/24/2014 1934 10/31/2014 1837 Full Code [id number]  Fritzi Mandes, MD Inpatient   10/11/2014 2347 10/17/2014 1844 Full Code [id number]  Lance Coon, MD Inpatient     Family Communication: As per critical care team Disposition Plan: To be determined  Consultants:  Critical care specialist  Antibiotics:  Meropenem  Time spent: 38 minutes [place]

## 2017-10-21 NOTE — Progress Notes (Signed)
CRITICAL CARE NOTE  CC  follow up respiratory failure/vent dependent/self extubated x 2   BRIEF PATIENT DESCRIPTION:  84 yo male admitted with acute on chronic renal failure post cardiac arrest mechanically intubated hypothermic protocol initiated '@36'  degrees C  SIGNIFICANT EVENTS/STUDIES:  09/30-Pt admitted to ICU post cardiac arrest mechanically intubated hypothermic protocol initiated by ER provider 09/30-CT Head and Cervical Spine revealed no acute intracranial abnormality. No skull fracture. Unchanged atrophy, chronic small vessel ischemia, and left parietal encephalomalacia. Advanced multilevel degenerative change in the cervical spine without evidence of acute fracture. Soft tissue prominence involving the right hypopharynx is only partially included in the field of view, this may represent retained secretions or asymmetric positioning of the patient's tongue, however underlying mass lesion is not excluded. Consider contrast enhanced CT of the neck as clinically indicated. Moderate right pleural effusion partially included in the field of view. 10/2-Echo revealed EF 65% 10/3-CT Head revealed no evident anoxic injury.Atrophy and remote left frontal parietal infarct 10/10-Failure to wean from vent and difficult intubation upon reintubation therefore ENT consulted for tracheostomy placement  10/14: S/p Trach  Subjective: Overall patient is doing well no major overnight event noted. Failure to place doughoff Off antibiotics now  No fever  Currently on PRVC 16/500  BP (!) 145/55   Pulse 74   Temp 99.7 F (37.6 C) (Axillary)   Resp 16   Ht '6\' 3"'  (1.905 m)   Wt 121.1 kg   SpO2 100%   BMI 33.37 kg/m    REVIEW OF SYSTEMS  PATIENT IS UNABLE TO PROVIDE COMPLETE REVIEW OF SYSTEMS DUE TO SEVERE CRITICAL ILLNESS   PHYSICAL EXAMINATION:  GENERAL:critically ill appearing, HEAD: Normocephalic, atraumatic.  EYES: Pupils equal, round, reactive to light.  No scleral icterus.  MOUTH:  Moist mucosal membrane. NECK: Supple. No thyromegaly. S/P trach PULMONARY:CTA with decreases breath sounds at bases without rhonci or wheezing CARDIOVASCULAR: S1 and S2. Regular rate and rhythm. No murmurs, rubs, or gallops.  GASTROINTESTINAL: Soft, nontender, but tense . No masses. Positive bowel sounds. No hepatosplenomegaly.  MUSCULOSKELETAL: No swelling, clubbing, or edema.  NEUROLOGIC: sedated heavily SKIN: Left toe dressing and right foot dressing noted   INTAKE/OUTPUT  Intake/Output Summary (Last 24 hours) at 10/21/2017 0731 Last data filed at 10/21/2017 0600 Gross per 24 hour  Intake 2333.8 ml  Output 2727 ml  Net -393.2 ml    LABS  CBC Recent Labs  Lab 10/19/17 0423 10/20/17 0320 10/21/17 0427  WBC 5.1 5.2 5.7  HGB 8.9* 8.3* 9.1*  HCT 30.3* 27.9* 30.8*  PLT 305 316 368   Coag's No results for input(s): APTT, INR in the last 168 hours. BMET Recent Labs  Lab 10/19/17 0423 10/20/17 0320 10/21/17 0427  NA 143 142 141  K 3.6 3.6 3.5  CL 105 103 101  CO2 31 32 31  BUN 39* 41* 44*  CREATININE 0.81 0.74 0.77  GLUCOSE 136* 112* 133*   Electrolytes Recent Labs  Lab 10/18/17 0516 10/19/17 0423 10/20/17 0320 10/21/17 0427  CALCIUM 8.8* 8.8* 8.7* 8.7*  MG 1.8  --  1.7  --   PHOS 3.7  --   --   --    Sepsis Markers No results for input(s): LATICACIDVEN, PROCALCITON, O2SATVEN in the last 168 hours. ABG No results for input(s): PHART, PCO2ART, PO2ART in the last 168 hours. Liver Enzymes Recent Labs  Lab 10/16/17 0715  ALBUMIN 2.4*   Cardiac Enzymes No results for input(s): TROPONINI, PROBNP in the last 168 hours. Glucose  Recent Labs  Lab 10/20/17 1543 10/20/17 1558 10/20/17 1919 10/21/17 0025 10/21/17 0507 10/21/17 0728  GLUCAP 115* 116* 119* 134* 127* 112*     Recent Results (from the past 240 hour(s))  Culture, respiratory (non-expectorated)     Status: None   Collection Time: 10/13/17 12:50 PM  Result Value Ref Range Status    Specimen Description   Final    TRACHEAL ASPIRATE Performed at [facility], [address], [place], [place] [address]    Special Requests   Final    NONE Performed at [facility], [address] [place], [place] [address]    Gram Stain   Final    FEW WBC PRESENT, PREDOMINANTLY PMN MODERATE GRAM POSITIVE RODS FEW GRAM VARIABLE ROD FEW GRAM NEGATIVE RODS RARE GRAM POSITIVE COCCI    Culture   Final    Consistent with normal respiratory flora. Performed at [facility], [place] [address], [place], [place] [address]    Report Status 10/15/2017 FINAL  Final    MEDICATIONS   Current Facility-Administered Medications:  .  0.9 %  sodium chloride infusion, , Intravenous, PRN, Tukov-Yual, Magdalene S, NP, Last Rate: 0 mL/hr at 10/19/17 1356 .  acetaminophen (TYLENOL) tablet 650 mg, 650 mg, Oral, Q6H PRN **OR** acetaminophen (TYLENOL) suppository 650 mg, 650 mg, Rectal, Q6H PRN, Amelia Jo, MD .  ALPRAZolam Duanne Moron) tablet 0.5 mg, 0.5 mg, Oral, TID, Flora Lipps, MD, 0.5 mg at 10/20/17 2133 .  atropine 1 MG/10ML injection 1 mg, 1 mg, Intravenous, PRN, Awilda Bill, NP, 1 mg at 10/07/17 1824 .  bisacodyl (DULCOLAX) EC tablet 5 mg, 5 mg, Oral, Daily PRN, Amelia Jo, MD .  chlorhexidine gluconate (MEDLINE KIT) (PERIDEX) 0.12 % solution 15 mL, 15 mL, Mouth Rinse, BID, Blakeney, Dana G, NP, 15 mL at 10/20/17 1944 .  famotidine (PEPCID) tablet 20 mg, 20 mg, Per Tube, BID, Charlett Nose, RPH, 20 mg at 10/20/17 2133 .  feeding supplement (PRO-STAT SUGAR FREE 64) liquid 60 mL, 60 mL, Per Tube, TID, Rosine Door, MD, 60 mL at 10/20/17 2131 .  feeding supplement (VITAL HIGH PROTEIN) liquid 1,000 mL, 1,000 mL, Per Tube, Continuous, Rosine Door, MD, Last Rate: 15 mL/hr at 10/21/17 0600 .  fentaNYL (SUBLIMAZE) bolus via infusion 25-100 mcg, 25-100 mcg, Intravenous, Q1H PRN, Awilda Bill, NP, 100 mcg at 10/16/17 1032 .  fentaNYL 2524mg in NS 2576m (1069mml) infusion-PREMIX, 25-400 mcg/hr, Intravenous, Continuous, Blakeney, Dana G, NP, Last Rate: 20 mL/hr at 10/21/17 0600, 200 mcg/hr at 10/21/17 0600 .  free water 200 mL, 200 mL, Per Tube, Q4H, KeeDarel Hong NP, 200 mL at 10/21/17 0400 .  furosemide (LASIX) injection 40 mg, 40 mg, Intravenous, BID, Conforti, John, DO, 40 mg at 10/20/17 1803 .  heparin injection 5,000 Units, 5,000 Units, Subcutaneous, Q8H, MaiAmelia JoD, 5,000 Units at 10/21/17 0515 .  hydrALAZINE (APRESOLINE) injection 10-20 mg, 10-20 mg, Intravenous, Q2H PRN, BasRosine DoorD .  Influenza vac split quadrivalent PF (FLUZONE HIGH-DOSE) injection 0.5 mL, 0.5 mL, Intramuscular, Tomorrow-1000, Blakeney, Dana G, NP .  insulin aspart (novoLOG) injection 0-20 Units, 0-20 Units, Subcutaneous, Q4H, Tukov-Yual, Magdalene S, NP, 3 Units at 10/21/17 0509 .  insulin detemir (LEVEMIR) injection 10 Units, 10 Units, Subcutaneous, BID, Conforti, John, DO, 10 Units at 10/20/17 2133 .  MEDLINE mouth rinse, 15 mL, Mouth Rinse, 10 times per day, BlaAwilda BillP, 15 mL at 10/21/17 0510 .  midazolam (VERSED) injection 1  mg, 1 mg, Intravenous, Q15 min PRN, Awilda Bill, NP .  midazolam (VERSED) injection 1 mg, 1 mg, Intravenous, Q2H PRN, Awilda Bill, NP, 1 mg at 10/14/17 2232 .  multivitamin liquid 15 mL, 15 mL, Per Tube, Daily, Flora Lipps, MD, 15 mL at 10/20/17 0938 .  ondansetron (ZOFRAN) tablet 4 mg, 4 mg, Oral, Q6H PRN **OR** ondansetron (ZOFRAN) injection 4 mg, 4 mg, Intravenous, Q6H PRN, Amelia Jo, MD .  polyethylene glycol (MIRALAX / GLYCOLAX) packet 17 g, 17 g, Per Tube, Daily, Flora Lipps, MD, 17 g at 10/20/17 0938 .  propofol (DIPRIVAN) 1000 MG/100ML infusion, 5-80 mcg/kg/min, Intravenous, Continuous, Blakeney, Dana G, NP, Last Rate: 31.1 mL/hr at 10/21/17 0600, 40 mcg/kg/min at 10/21/17 0600 .  senna-docusate (Senokot-S) tablet 2 tablet, 2 tablet, Per Tube, BID, Flora Lipps, MD, 2 tablet at 10/20/17 2133 .   traZODone (DESYREL) tablet 25 mg, 25 mg, Per Tube, QHS PRN, Darel Hong D, NP, 25 mg at 10/20/17 2132 .  valproic acid (DEPAKENE) solution 250 mg, 250 mg, Per Tube, Q8H, Charlett Nose, RPH, 250 mg at 10/21/17 0515      Indwelling Urinary Catheter continued, requirement due to   Reason to continue Indwelling Urinary Catheter for strict Intake/Output monitoring for hemodynamic instability   Central Line continued, requirement due to   Reason to continue [employer] Monitoring of central venous pressure or other hemodynamic parameters   Ventilator continued, requirement due to, resp failure    Ventilator Sedation RASS 0 to -2     ASSESSMENT AND PLAN SYNOPSIS Acute respiratory failure secondary to LLL pneumonia and pulmonary edema  Mechanical intubation s/p cardiac arrest  Hx: COPD and Obesity  Full vent support-vent Prn bronchodilator therapy  VAP bundle implemented S/P trach- Consider sedation weaning and we can plan weaning from ventilator No abg done in last few days will get one and adjust vent accordingly Sedation vacation today to eval neurological status  Acute on chronic diastolic CHF Cardiac Arrest (cardiac rhythm unknown)-hypothermic protocol completed Bradycardia-resolved Hx: HTN, CAD, and Hyperlipidemia  Continuous telemetry monitoring  Maintain map >65 IV lasix as bp permits 40 mg bid Prn hydralazine for bp control Cardiology consulted appreciate input  Acute on chronic renal failure with hyperkalemia-resolved Metabolic acidosis-resolved Trend BMP-- Replace K Replace electrolytes as indicated  Monitor UOP  Avoid nephrotoxic medications   LLL pneumonia  Diabetic Ulcers Bilateral Lower Extremities Trend WBC and monitor fever curve  Finished meropenum/vanco Monitor if further worsening of sepsis will rpt culture and start antibiotics again Podiatry consult for foot  Xray foot  Anemia without obvious acute blood loss  Trend CBC   Monitor for s/sx of bleeding  Transfuse for hgb <7 VTE DI:YMEB heparin   Nutrition Constipation  SUP px: pepcid Continue TF's  Continue bowel regimen  Hyperglycemia CBG's q4hrs SSI  Acute encephalopathy Agitation/Delirium Hx: Dementia, Depression, and Anxiety Maintain RASS goal 0 to -1 Fentanyl gtt and prn versed to maintain RASS goal  Currently on propofol Plan to taper sedation and evaluate further    Skin/Wound: Foot dressing   Electrolytes: Replace electrolytes per ICU electrolyte replacement protocol.   IVF: none  Nutrition: Tube feeds as tolerated GI consult for peg placement  Prophylaxis: DVT Prophylaxis with Heparin,. GI Prophylaxis.   Restraints: Soft limb to prevent self extubation  PT/OT eval and treat. OOB when appropriate.   Lines/Tubes:  14 days per urology foley  No central line.  ADVANCE DIRECTIVE:Full code  FAMILY DISCUSSION:Palliative following  Quality Care: PPI, DVT prophylaxis, HOB elevated, Infection control all reviewed and addressed.  Events and notes from last 24 hours reviewed. Care plan discussed on multidisciplinary rounds  CC TIME:40 min    Old records reviewed discussed results and management plan with patient  Images personally reviewed and results and labs reviewed and discussed with patient.  All medication reviewed and adjusted  Further management depending on test results and work up as outlined above.   Lahoma Rocker, MD  10/21/2017 7:31 AM [facility]

## 2017-10-21 NOTE — Progress Notes (Signed)
Attempted [place]obb[place] placement per protocol.  Met resistance at 30cm.  Unable to place tube.

## 2017-10-21 NOTE — Progress Notes (Signed)
Nutrition Follow-up  DOCUMENTATION CODES:   Obesity unspecified  INTERVENTION:  Provide Vital High Protein at 30 mL/hr (720 mL goal daily volume) + Pro-Stat 60 mL QID. Provides 1520 kcal, 183 grams of protein, 605 mL H2O daily.  Continue liquid MVI daily per tube.  Provide free water flush of 150 mL Q4hrs. Total of 1505 mL H2O daily including water in tube feeding.  NUTRITION DIAGNOSIS:   Inadequate oral intake related to inability to eat as evidenced by NPO status.  Ongoing - addressing with TF regimen.  GOAL:   Provide needs based on ASPEN/SCCM guidelines  Met with TF regimen.  MONITOR:   Vent status, Labs, Weight trends, TF tolerance, Skin, I & O's  REASON FOR ASSESSMENT:   Ventilator    ASSESSMENT:   84 year old male with PMHx of chronic diastolic CHF, COPD, CAD, DM, HTN, GERD, HLD, anxiety, depression, CKD stage III, dementia who is admitted from [facility] after cardiac arrest, intubated on 9/30, 36C TTM protocol initiated, with possible LLL PNA, pulmonary edema, acute on chronic renal failure.   -Patient s/p tracheostomy tube placement on 10/14. -Rectal tube placed today. -Pending GI consult for PEG tube placement.  Patient ventilated through tracheostomy and sedated. On PRVC mode with FiO2 30% and PEEP 5 cmH2O. Tolerating tube feeds. Having large liquid bowel movements per chart.  Enteral Access: 14 FR. OGT placed 10/7; terminates in stomach per abdominal x-ray 10/7 ; 68 cm at corner of mouth  MAP: 69-116 mmHg  TF: pt tolerating Vital High Protein at 15 mL/hr  Patient is currently intubated on ventilator support Ve: 8 L/min Temp (24hrs), Avg:98.3 F (36.8 C), Min:97.1 F (36.2 C), Max:99.7 F (37.6 C)  Propofol: N/A - currently off  Medications reviewed and include: Xanax 0.5 mg TID, famotidine, free water flush 200 mL Q4hrs, Lasix 40 mg BID IV, Novolog 0-20 units Q4hrs, Levemir 10 units BID, liquid MVI daily per tube, valproic acid, Precedex  gtt, fentanyl gtt.  Labs reviewed: CBG 112-150, BUN 44. Sodium now 141.  I/O: 2825 mL UOP yesterday (1 mL/kg/hr)  Weight trend: 121.1 kg on 10/15; -4.7 kg from admission  Discussed with RN and on rounds. Coming down on propofol gtt today.  Diet Order:   Diet Order            Diet NPO time specified  Diet effective now              EDUCATION NEEDS:   No education needs have been identified at this time  Skin:  Skin Assessment: Skin Integrity Issues:(cellulitis and DM ulcers to bilateral lower extremities)  Last BM:  10/21/2017 - large type 7; rectal tube now in place  Height:   Ht Readings from Last 1 Encounters:  10/07/17 _0  (1.905 m)    Weight:   Wt Readings from Last 1 Encounters:  10/21/17 121.1 kg    Ideal Body Weight:  89.1 kg  BMI:  Body mass index is 33.37 kg/m.  Estimated Nutritional Needs:   Kcal:  3545-6256 (11-14 kcal/kg)  Protein:  178 grams (2 grams/kg IBW)  Fluid:  1.5-1.8 L/day  Willey Blade, MS, RD, LDN Office: [phone] Pager: [phone] After Hours/Weekend Pager: [phone]

## 2017-10-21 NOTE — Consult Note (Addendum)
ORTHOPAEDIC CONSULTATION  REQUESTING PHYSICIAN: Katha Hamming, MD  Chief Complaint: left great toe ulcer and right lateral ankle ulcer.  HPI: Billy Liu is a 84 y.o. male who complains of  Above.  Critically ill and currently intubated  Past Medical History:  Diagnosis Date  . Anxiety   . Cellulitis 10/17/2014   left lower leg  . Chronic diastolic CHF (congestive heart failure) (HCC)   . CKD (chronic kidney disease), stage III (HCC)   . COPD (chronic obstructive pulmonary disease) (HCC)   . Coronary artery disease   . Dementia (HCC)    Alzheimer's   . Depression   . Diabetes mellitus without complication (HCC)   . GERD (gastroesophageal reflux disease)   . Hyperlipemia   . Hypertension   . Obesity   . Renal disorder    Past Surgical History:  Procedure Laterality Date  . COLONOSCOPY N/A 06/20/2017   Procedure: COLONOSCOPY;  Surgeon: Toney Reil, MD;  Location: [facility] ENDOSCOPY;  Service: Gastroenterology;  Laterality: N/A;  . ESOPHAGOGASTRODUODENOSCOPY N/A 06/20/2017   Procedure: ESOPHAGOGASTRODUODENOSCOPY (EGD);  Surgeon: Toney Reil, MD;  Location: [facility] ENDOSCOPY;  Service: Gastroenterology;  Laterality: N/A;  . LOWER EXTREMITY ANGIOGRAPHY Left 02/11/2017   Procedure: LOWER EXTREMITY ANGIOGRAPHY;  Surgeon: Renford Dills, MD;  Location: [facility] INVASIVE CV LAB;  Service: Cardiovascular;  Laterality: Left;  . LOWER EXTREMITY ANGIOGRAPHY Left 03/11/2017   Procedure: LOWER EXTREMITY ANGIOGRAPHY;  Surgeon: Renford Dills, MD;  Location: [facility] INVASIVE CV LAB;  Service: Cardiovascular;  Laterality: Left;  . LOWER EXTREMITY INTERVENTION  03/11/2017   Procedure: LOWER EXTREMITY INTERVENTION;  Surgeon: Renford Dills, MD;  Location: [facility] INVASIVE CV LAB;  Service: Cardiovascular;;  . PERIPHERAL VASCULAR ATHERECTOMY Left 02/18/2017   Procedure: PERIPHERAL VASCULAR ATHERECTOMY;  Surgeon: Renford Dills, MD;  Location: [facility] INVASIVE CV LAB;  Service:  Cardiovascular;  Laterality: Left;  . TRACHEOSTOMY TUBE PLACEMENT N/A 10/20/2017   Procedure: TRACHEOSTOMY;  Surgeon: Bud Face, MD;  Location: [facility] ORS;  Service: ENT;  Laterality: N/A;   Social History   Socioeconomic History  . Marital status: Married    Spouse name: Not on file  . Number of children: Not on file  . Years of education: Not on file  . Highest education level: Not on file  Occupational History  . Not on file  Social Needs  . Financial resource strain: Not on file  . Food insecurity:    Worry: Not on file    Inability: Not on file  . Transportation needs:    Medical: Not on file    Non-medical: Not on file  Tobacco Use  . Smoking status: Never Smoker  . Smokeless tobacco: Former Neurosurgeon    Types: Chew  Substance and Sexual Activity  . Alcohol use: No  . Drug use: No  . Sexual activity: Not on file  Lifestyle  . Physical activity:    Days per week: Not on file    Minutes per session: Not on file  . Stress: Not on file  Relationships  . Social connections:    Talks on phone: Not on file    Gets together: Not on file    Attends religious service: Not on file    Active member of club or organization: Not on file    Attends meetings of clubs or organizations: Not on file    Relationship status: Not on file  Other Topics Concern  . Not on file  Social History Narrative  . Not  on file   Family History  Problem Relation Age of Onset  . Cancer Brother    Allergies  Allergen Reactions  . Penicillins Other (See Comments)    Per MAR Has patient had a PCN reaction causing immediate rash, facial/tongue/throat swelling, SOB or lightheadedness with hypotension: Unknown Has patient had a PCN reaction causing severe rash involving mucus membranes or skin necrosis: Unknown Has patient had a PCN reaction that required hospitalization: Unknown Has patient had a PCN reaction occurring within the last 10 years: Unknown If all of the above answers are "NO",  then may proceed with Cephalosporin use.    Prior to Admission medications   Medication Sig Start Date End Date Taking? Authorizing Provider  acetaminophen (TYLENOL) 500 MG tablet Take 500 mg by mouth 3 (three) times daily.   Yes [provider]  amLODipine (NORVASC) 10 MG tablet Take 10 mg by mouth daily.   Yes [provider]  aspirin EC 81 MG tablet Take 81 mg by mouth daily.   Yes [provider]  Cholecalciferol (VITAMIN D) 2000 units tablet Take 2,000 Units by mouth daily.   Yes [provider]  cloNIDine (CATAPRES) 0.2 MG tablet Take 1 tablet (0.2 mg total) by mouth 3 (three) times daily. 10/31/14  Yes Houston Siren, MD  divalproex (DEPAKOTE) 250 MG DR tablet Take 250 mg by mouth daily.    Yes [provider]  divalproex (DEPAKOTE) 500 MG DR tablet Take 500 mg by mouth at bedtime.   Yes [provider]  ferrous sulfate 324 (65 Fe) MG TBEC Take 324 mg by mouth daily.   Yes [provider]  finasteride (PROSCAR) 5 MG tablet Take 1 tablet (5 mg total) by mouth daily. 10/31/14  Yes Sainani, Rolly Pancake, MD  glucagon, human recombinant, (GLUCAGEN DIAGNOSTIC) 1 MG injection Inject 1 mg into the muscle once as needed for low blood sugar.   Yes [provider]  hydrALAZINE (APRESOLINE) 100 MG tablet Take 100 mg by mouth 3 (three) times daily.   Yes [provider]  insulin detemir (LEVEMIR) 100 UNIT/ML injection Inject 40 Units into the skin 2 (two) times daily.    Yes [provider]  metoprolol succinate (TOPROL-XL) 100 MG 24 hr tablet Take 300 mg by mouth daily.    Yes [provider]  pantoprazole (PROTONIX) 40 MG tablet Take 1 tablet (40 mg total) by mouth daily. 06/23/17  Yes Sudini, Wardell Heath, MD  polyethylene glycol (MIRALAX / GLYCOLAX) packet Take 17 g by mouth daily.    Yes [provider]  senna (SENOKOT) 8.6 MG TABS tablet Take 2 tablets by mouth at bedtime.   Yes [provider]  sertraline (ZOLOFT) 25 MG tablet Take 75 mg by mouth daily.    Yes [provider]  simvastatin (ZOCOR) 20 MG tablet Take 20 mg by mouth at bedtime.    Yes [provider]  Sunscreens (AVEENO DAILY MOISTURIZER) LOTN Apply topically at bedtime. Apply to bilateral feet and legs   Yes [provider]  torsemide (DEMADEX) 20 MG tablet Take 2 tablets (40 mg total) by mouth daily. 10/31/14  Yes Sainani, Rolly Pancake, MD  vitamin C (ASCORBIC ACID) 500 MG tablet Take 500 mg by mouth daily.   Yes [provider]  omeprazole (PRILOSEC) 40 MG capsule Take 1 capsule (40 mg total) by mouth 2 (two) times daily for 14 days. 06/25/17 07/09/17  Toney Reil, MD   Dg Chest Port 1  View  Result Date: 10/21/2017 CLINICAL DATA:  Status post intubation. EXAM: PORTABLE CHEST 1 VIEW COMPARISON:  Radiograph of October 19, 2017. FINDINGS: Stable cardiomediastinal silhouette. Endotracheal tube has been removed. Tracheostomy tube is now seen in grossly good position. Nasogastric tube is unchanged in position. No pneumothorax is noted. Mild bibasilar subsegmental atelectasis is noted with associated pleural effusions. Bony thorax is unremarkable. IMPRESSION: Tracheostomy tube in grossly good position. Stable bibasilar subsegmental atelectasis with associated pleural effusions. Electronically Signed   By: Lupita Raider, M.D.   On: 10/21/2017 08:28   Dg Foot 2 Views Left  Result Date: 10/21/2017 CLINICAL DATA:  Gangrenous left foot. EXAM: LEFT FOOT - 2 VIEW COMPARISON:  None. FINDINGS: There is no evidence of fracture or dislocation. There is no evidence of arthropathy or other focal bone abnormality. Vascular calcifications are noted. No lytic destruction is seen to suggest osteomyelitis. Diffuse soft tissue swelling is noted. IMPRESSION: No significant bony abnormality is noted. Diffuse soft tissue swelling is noted suggesting inflammation or edema. Electronically Signed   By:  Lupita Raider, M.D.   On: 10/21/2017 08:30    Positive ROS: Pt not able to provide ROS. Intubated.  No family at bedside.  Physical Exam: General:Pt intubated  Vascular:  Left foot:Dorsalis Pedis:  absent Posterior Tibial:  absent  Right foot: Dorsalis Pedis:  absent Posterior Tibial:  absent  Neuro:not tested  Derm:Lateral ankle ulcer with bleeding and superficial Left great toe ulcer very small and superficial on distal aspect  Ortho/MS: edema to b/l lower leg   Assessment: Ulceration as above.  Plan: Continue with current dressings.  Wounds do not appear infected.  No urgent intervention needed at this time.  Can follow remotely and can re-consult if needed.      Irean Hong, DPM Cell [phone]   10/21/2017 12:39 PM

## 2017-10-21 NOTE — Progress Notes (Signed)
SUBJECTIVE: Patient had tracheostomy yesterday and remains on ventilator   Vitals:   10/21/17 0600 10/21/17 0700 10/21/17 0800 10/21/17 0900  BP: (!) 140/58 (!) 145/55 (!) 164/80 (!) 146/71  Pulse: 73 74 85 80  Resp: 16 16 (!) 28 16  Temp:   99 F (37.2 C)   TempSrc:   Oral   SpO2: 100% 100% 100% 100%  Weight:      Height:        Intake/Output Summary (Last 24 hours) at 10/21/2017 0951 Last data filed at 10/21/2017 0916 Gross per 24 hour  Intake 1659.91 ml  Output 2575 ml  Net -915.09 ml    LABS: Basic Metabolic Panel: Recent Labs    10/20/17 0320 10/21/17 0427  NA 142 141  K 3.6 3.5  CL 103 101  CO2 32 31  GLUCOSE 112* 133*  BUN 41* 44*  CREATININE 0.74 0.77  CALCIUM 8.7* 8.7*  MG 1.7  --    Liver Function Tests: No results for input(s): AST, ALT, ALKPHOS, BILITOT, PROT, ALBUMIN in the last 72 hours. No results for input(s): LIPASE, AMYLASE in the last 72 hours. CBC: Recent Labs    10/20/17 0320 10/21/17 0427  WBC 5.2 5.7  NEUTROABS 3.1 3.5  HGB 8.3* 9.1*  HCT 27.9* 30.8*  MCV 85.1 86.8  PLT 316 368   Cardiac Enzymes: No results for input(s): CKTOTAL, CKMB, CKMBINDEX, TROPONINI in the last 72 hours. BNP: Invalid input(s): POCBNP D-Dimer: No results for input(s): DDIMER in the last 72 hours. Hemoglobin A1C: No results for input(s): HGBA1C in the last 72 hours. Fasting Lipid Panel: No results for input(s): CHOL, HDL, LDLCALC, TRIG, CHOLHDL, LDLDIRECT in the last 72 hours. Thyroid Function Tests: No results for input(s): TSH, T4TOTAL, T3FREE, THYROIDAB in the last 72 hours.  Invalid input(s): FREET3 Anemia Panel: No results for input(s): VITAMINB12, FOLATE, FERRITIN, TIBC, IRON, RETICCTPCT in the last 72 hours.   PHYSICAL EXAM General: Well developed, well nourished, in no acute distress HEENT:  Normocephalic and atramatic Neck:  No JVD.  Lungs: Clear bilaterally to auscultation and percussion. Heart: HRRR . Normal S1 and S2 without gallops  or murmurs.  Abdomen: Bowel sounds are positive, abdomen soft and non-tender  Msk:  Back normal, normal gait. Normal strength and tone for age. Extremities: No clubbing, cyanosis or edema.   Neuro: Alert and oriented X 3. Psych:  Good affect, responds appropriately  TELEMETRY: Sinus rhythm  ASSESSMENT AND PLAN: Respiratory failure with ventilator dependence and status post cardiac arrest without any progress neurologically.  Family does not want him to be extubated and probably will go to long-term care service.  Active Problems:   Cardiac arrest (HCC)    Laurier Nancy, MD, [facility] 10/21/2017 9:51 AM

## 2017-10-21 NOTE — Consult Note (Signed)
Pharmacy Electrolyte Monitoring Consult:  Pharmacy consulted to assist in monitoring and replacing electrolytes in this 84 y.o. male admitted on 10/06/2017 with Cardiac Arrest   Labs:  Sodium (mmol/L)  Date Value  10/21/2017 141  07/04/2013 133 (L)   Potassium (mmol/L)  Date Value  10/21/2017 3.5  07/04/2013 4.3   Magnesium (mg/dL)  Date Value  40/98/1191 2.1   Phosphorus (mg/dL)  Date Value  47/82/9562 3.7   Calcium (mg/dL)  Date Value  13/08/6576 8.7 (L)   Calcium, Total (mg/dL)  Date Value  46/96/2952 8.7   Albumin (g/dL)  Date Value  84/13/2440 2.4 (L)  07/04/2013 3.6    Assessment/Plan: 1. Electrolytes: Patient received KCl 10 mEq IV x 4. No need for magnesium replacement today. Will monitor sodium; patient receiving 150 mL free water flushes q4h. Patient is also receiving lasix 40 mg IV BID.  2. Glucose mangagment: Levemir 10 units BID and high dose SSI Q4hr. Patient receiving continuous tube feeds. Glucose 119-150 over past 24 hours. Will continue to monitor and adjust per consult.   3. Constipation Management: Last BM 10/15. Senna and Miralax were discontinued due to [facility] placement.  Pharmacy will continue to monitor and adjust per consult.   Mauri Reading, PharmD Pharmacy Resident  10/21/2017 3:37 PM

## 2017-10-21 NOTE — Consult Note (Signed)
Midge Minium, MD [facility]  [address], [address] [place], [place] [address] Phone: [phone] Fax : [phone]  Consultation  Referring Provider:     Dr. Sherryll Burger Primary Care Physician:  Keane Police, MD Primary Gastroenterologist:  Dr. Allegra Lai         Reason for Consultation:     PEG tube placement  Date of Admission:  10/06/2017 Date of Consultation:  10/21/2017         HPI:   Billy Liu is a 84 y.o. male who has been in the ICU post cardiac arrest.  The patient has a history of anxiety CHF, coronary artery disease, dementia, GERD, hypertension, hyperlipidemia and obesity.  The patient had a tracheostomy placed yesterday and I am now being consulted to place a PEG tube for nutrition.  The patient was reported to be post cardiac arrest and being found down in the nursing home.  The patient is now ventilated and unable to give any history.  Past Medical History:  Diagnosis Date  . Anxiety   . Cellulitis 10/17/2014   left lower leg  . Chronic diastolic CHF (congestive heart failure) (HCC)   . CKD (chronic kidney disease), stage III (HCC)   . COPD (chronic obstructive pulmonary disease) (HCC)   . Coronary artery disease   . Dementia (HCC)    Alzheimer's   . Depression   . Diabetes mellitus without complication (HCC)   . GERD (gastroesophageal reflux disease)   . Hyperlipemia   . Hypertension   . Obesity   . Renal disorder     Past Surgical History:  Procedure Laterality Date  . COLONOSCOPY N/A 06/20/2017   Procedure: COLONOSCOPY;  Surgeon: Toney Reil, MD;  Location: [facility] ENDOSCOPY;  Service: Gastroenterology;  Laterality: N/A;  . ESOPHAGOGASTRODUODENOSCOPY N/A 06/20/2017   Procedure: ESOPHAGOGASTRODUODENOSCOPY (EGD);  Surgeon: Toney Reil, MD;  Location: [facility] ENDOSCOPY;  Service: Gastroenterology;  Laterality: N/A;  . LOWER EXTREMITY ANGIOGRAPHY Left 02/11/2017   Procedure: LOWER EXTREMITY ANGIOGRAPHY;  Surgeon: Renford Dills, MD;   Location: [facility] INVASIVE CV LAB;  Service: Cardiovascular;  Laterality: Left;  . LOWER EXTREMITY ANGIOGRAPHY Left 03/11/2017   Procedure: LOWER EXTREMITY ANGIOGRAPHY;  Surgeon: Renford Dills, MD;  Location: [facility] INVASIVE CV LAB;  Service: Cardiovascular;  Laterality: Left;  . LOWER EXTREMITY INTERVENTION  03/11/2017   Procedure: LOWER EXTREMITY INTERVENTION;  Surgeon: Renford Dills, MD;  Location: [facility] INVASIVE CV LAB;  Service: Cardiovascular;;  . PERIPHERAL VASCULAR ATHERECTOMY Left 02/18/2017   Procedure: PERIPHERAL VASCULAR ATHERECTOMY;  Surgeon: Renford Dills, MD;  Location: [facility] INVASIVE CV LAB;  Service: Cardiovascular;  Laterality: Left;  . TRACHEOSTOMY TUBE PLACEMENT N/A 10/20/2017   Procedure: TRACHEOSTOMY;  Surgeon: Bud Face, MD;  Location: [facility] ORS;  Service: ENT;  Laterality: N/A;    Prior to Admission medications   Medication Sig Start Date End Date Taking? Authorizing Provider  acetaminophen (TYLENOL) 500 MG tablet Take 500 mg by mouth 3 (three) times daily.   Yes [provider]  amLODipine (NORVASC) 10 MG tablet Take 10 mg by mouth daily.   Yes [provider]  aspirin EC 81 MG tablet Take 81 mg by mouth daily.   Yes [provider]  Cholecalciferol (VITAMIN D) 2000 units tablet Take 2,000 Units by mouth daily.   Yes [provider]  cloNIDine (CATAPRES) 0.2 MG tablet Take 1 tablet (0.2 mg total) by mouth 3 (three) times daily. 10/31/14  Yes Houston Siren, MD  divalproex (DEPAKOTE) 250  MG DR tablet Take 250 mg by mouth daily.    Yes [provider]  divalproex (DEPAKOTE) 500 MG DR tablet Take 500 mg by mouth at bedtime.   Yes [provider]  ferrous sulfate 324 (65 Fe) MG TBEC Take 324 mg by mouth daily.   Yes [provider]  finasteride (PROSCAR) 5 MG tablet Take 1 tablet (5 mg total) by mouth daily. 10/31/14  Yes Sainani, Rolly Pancake, MD  glucagon, human recombinant, (GLUCAGEN DIAGNOSTIC) 1 MG  injection Inject 1 mg into the muscle once as needed for low blood sugar.   Yes [provider]  hydrALAZINE (APRESOLINE) 100 MG tablet Take 100 mg by mouth 3 (three) times daily.   Yes [provider]  insulin detemir (LEVEMIR) 100 UNIT/ML injection Inject 40 Units into the skin 2 (two) times daily.    Yes [provider]  metoprolol succinate (TOPROL-XL) 100 MG 24 hr tablet Take 300 mg by mouth daily.    Yes [provider]  pantoprazole (PROTONIX) 40 MG tablet Take 1 tablet (40 mg total) by mouth daily. 06/23/17  Yes Sudini, Wardell Heath, MD  polyethylene glycol (MIRALAX / GLYCOLAX) packet Take 17 g by mouth daily.    Yes [provider]  senna (SENOKOT) 8.6 MG TABS tablet Take 2 tablets by mouth at bedtime.   Yes [provider]  sertraline (ZOLOFT) 25 MG tablet Take 75 mg by mouth daily.    Yes [provider]  simvastatin (ZOCOR) 20 MG tablet Take 20 mg by mouth at bedtime.    Yes [provider]  Sunscreens (AVEENO DAILY MOISTURIZER) LOTN Apply topically at bedtime. Apply to bilateral feet and legs   Yes [provider]  torsemide (DEMADEX) 20 MG tablet Take 2 tablets (40 mg total) by mouth daily. 10/31/14  Yes Sainani, Rolly Pancake, MD  vitamin C (ASCORBIC ACID) 500 MG tablet Take 500 mg by mouth daily.   Yes [provider]  omeprazole (PRILOSEC) 40 MG capsule Take 1 capsule (40 mg total) by mouth 2 (two) times daily for 14 days. 06/25/17 07/09/17  Toney Reil, MD    Family History  Problem Relation Age of Onset  . Cancer Brother      Social History   Tobacco Use  . Smoking status: Never Smoker  . Smokeless tobacco: Former Neurosurgeon    Types: Chew  Substance Use Topics  . Alcohol use: No  . Drug use: No    Allergies as of 10/06/2017 - Review Complete 10/06/2017  Allergen Reaction Noted  . Penicillins Other (See Comments) 10/11/2014    Review of Systems:    All systems reviewed and negative  except where noted in HPI.   Physical Exam:  Vital signs in last 24 hours: Temp:  [97.4 F (36.3 C)-99.7 F (37.6 C)] 99.2 F (37.3 C) (10/15 1135) Pulse Rate:  [61-85] 65 (10/15 1700) Resp:  [13-28] 16 (10/15 1700) BP: (125-179)/(43-91) 173/66 (10/15 1700) SpO2:  [98 %-100 %] 100 % (10/15 1700) FiO2 (%):  [30 %] 30 % (10/15 1530) Weight:  [121.1 kg] 121.1 kg (10/15 0500) Last BM Date: 10/21/17 General: Intubated and critically ill-appearing Head:  Normocephalic and atraumatic. Eyes:   No icterus.   Conjunctiva pink. PERRLA. Ears:  Normal auditory acuity. Neck:  Supple; no masses or thyroidomegaly Lungs: Respirations even and unlabored. Lungs clear to auscultation bilaterally.   No wheezes, crackles, or rhonchi.  Heart:  Regular rate and rhythm;  Without murmur, clicks, rubs  or gallops Abdomen:  Soft, nondistended, nontender. Normal bowel sounds. No appreciable masses or hepatomegaly.  No rebound or guarding.  Rectal:  Not performed. Msk:  Symmetrical without gross deformities.   Extremities:  With edema, but without cyanosis or clubbing. Neurologic: Unresponsive to verbal commands Skin:  Intact without significant lesions or rashes. Cervical Nodes:  No significant cervical adenopathy.   LAB RESULTS: Recent Labs    10/19/17 0423 10/20/17 0320 10/21/17 0427  WBC 5.1 5.2 5.7  HGB 8.9* 8.3* 9.1*  HCT 30.3* 27.9* 30.8*  PLT 305 316 368   BMET Recent Labs    10/19/17 0423 10/20/17 0320 10/21/17 0427  NA 143 142 141  K 3.6 3.6 3.5  CL 105 103 101  CO2 31 32 31  GLUCOSE 136* 112* 133*  BUN 39* 41* 44*  CREATININE 0.81 0.74 0.77  CALCIUM 8.8* 8.7* 8.7*   LFT No results for input(s): PROT, ALBUMIN, AST, ALT, ALKPHOS, BILITOT, BILIDIR, IBILI in the last 72 hours. PT/INR No results for input(s): LABPROT, INR in the last 72 hours.  STUDIES: Dg Chest Port 1 View  Result Date: 10/21/2017 CLINICAL DATA:  Status post intubation. EXAM: PORTABLE CHEST 1 VIEW  COMPARISON:  Radiograph of October 19, 2017. FINDINGS: Stable cardiomediastinal silhouette. Endotracheal tube has been removed. Tracheostomy tube is now seen in grossly good position. Nasogastric tube is unchanged in position. No pneumothorax is noted. Mild bibasilar subsegmental atelectasis is noted with associated pleural effusions. Bony thorax is unremarkable. IMPRESSION: Tracheostomy tube in grossly good position. Stable bibasilar subsegmental atelectasis with associated pleural effusions. Electronically Signed   By: Lupita Raider, M.D.   On: 10/21/2017 08:28   Dg Foot 2 Views Left  Result Date: 10/21/2017 CLINICAL DATA:  Gangrenous left foot. EXAM: LEFT FOOT - 2 VIEW COMPARISON:  None. FINDINGS: There is no evidence of fracture or dislocation. There is no evidence of arthropathy or other focal bone abnormality. Vascular calcifications are noted. No lytic destruction is seen to suggest osteomyelitis. Diffuse soft tissue swelling is noted. IMPRESSION: No significant bony abnormality is noted. Diffuse soft tissue swelling is noted suggesting inflammation or edema. Electronically Signed   By: Lupita Raider, M.D.   On: 10/21/2017 08:30      Impression / Plan:   Assessment: Active Problems:   Cardiac arrest (HCC)   Billy Liu is a 84 y.o. y/o male with a history of cardiac arrest who is now in the ICU with a recently placed tracheostomy and in need of a PEG tube.    Plan:  The patient will have his PEG tube feedings stopped tomorrow and will have a PEG placed on Thursday.  The patient will need to have his heparin held prior to the procedure.    Thank you for involving me in the care of this patient.      LOS: 14 days   Midge Minium, MD  10/21/2017, 6:05 PM    Note: This dictation was prepared with Dragon dictation along with smaller phrase technology. Any transcriptional errors that result from this process are unintentional.

## 2017-10-21 NOTE — Care Management (Signed)
Update from [place] with [facility] that authorization will be sent on Thursday.

## 2017-10-22 ENCOUNTER — Inpatient Hospital Stay: Payer: Medicare Other

## 2017-10-22 LAB — BLOOD GAS, ARTERIAL
ACID-BASE EXCESS: 9.2 mmol/L — AB (ref 0.0–2.0)
BICARBONATE: 33.5 mmol/L — AB (ref 20.0–28.0)
FIO2: 0.3
MECHVT: 500 mL
O2 Saturation: 98.1 %
PATIENT TEMPERATURE: 37
PEEP: 5 cmH2O
PH ART: 7.49 — AB (ref 7.350–7.450)
PO2 ART: 97 mmHg (ref 83.0–108.0)
RATE: 16 resp/min
pCO2 arterial: 44 mmHg (ref 32.0–48.0)

## 2017-10-22 LAB — GLUCOSE, CAPILLARY
GLUCOSE-CAPILLARY: 197 mg/dL — AB (ref 70–99)
Glucose-Capillary: 130 mg/dL — ABNORMAL HIGH (ref 70–99)
Glucose-Capillary: 162 mg/dL — ABNORMAL HIGH (ref 70–99)
Glucose-Capillary: 187 mg/dL — ABNORMAL HIGH (ref 70–99)
Glucose-Capillary: 188 mg/dL — ABNORMAL HIGH (ref 70–99)
Glucose-Capillary: 251 mg/dL — ABNORMAL HIGH (ref 70–99)

## 2017-10-22 LAB — CBC WITH DIFFERENTIAL/PLATELET
Abs Immature Granulocytes: 0.02 10*3/uL (ref 0.00–0.07)
BASOS ABS: 0.1 10*3/uL (ref 0.0–0.1)
BASOS PCT: 1 %
EOS PCT: 8 %
Eosinophils Absolute: 0.6 10*3/uL — ABNORMAL HIGH (ref 0.0–0.5)
HCT: 29.7 % — ABNORMAL LOW (ref 39.0–52.0)
Hemoglobin: 8.8 g/dL — ABNORMAL LOW (ref 13.0–17.0)
Immature Granulocytes: 0 %
Lymphocytes Relative: 14 %
Lymphs Abs: 1 10*3/uL (ref 0.7–4.0)
MCH: 25 pg — ABNORMAL LOW (ref 26.0–34.0)
MCHC: 29.6 g/dL — AB (ref 30.0–36.0)
MCV: 84.4 fL (ref 80.0–100.0)
MONO ABS: 0.5 10*3/uL (ref 0.1–1.0)
Monocytes Relative: 8 %
NRBC: 0 % (ref 0.0–0.2)
Neutro Abs: 4.5 10*3/uL (ref 1.7–7.7)
Neutrophils Relative %: 69 %
PLATELETS: 398 10*3/uL (ref 150–400)
RBC: 3.52 MIL/uL — AB (ref 4.22–5.81)
RDW: 16.8 % — AB (ref 11.5–15.5)
WBC: 6.7 10*3/uL (ref 4.0–10.5)

## 2017-10-22 LAB — TSH: TSH: 2.997 u[IU]/mL (ref 0.350–4.500)

## 2017-10-22 LAB — COMPREHENSIVE METABOLIC PANEL
ALT: 20 U/L (ref 0–44)
ANION GAP: 6 (ref 5–15)
AST: 28 U/L (ref 15–41)
Albumin: 2.6 g/dL — ABNORMAL LOW (ref 3.5–5.0)
Alkaline Phosphatase: 88 U/L (ref 38–126)
BUN: 38 mg/dL — ABNORMAL HIGH (ref 8–23)
CHLORIDE: 103 mmol/L (ref 98–111)
CO2: 31 mmol/L (ref 22–32)
Calcium: 9.1 mg/dL (ref 8.9–10.3)
Creatinine, Ser: 0.74 mg/dL (ref 0.61–1.24)
GFR calc non Af Amer: >60 mL/min (ref >60)
Glucose, Bld: 191 mg/dL — ABNORMAL HIGH (ref 70–99)
POTASSIUM: 3.4 mmol/L — AB (ref 3.5–5.1)
SODIUM: 140 mmol/L (ref 135–145)
Total Bilirubin: 0.2 mg/dL — ABNORMAL LOW (ref 0.3–1.2)
Total Protein: 6.8 g/dL (ref 6.5–8.1)

## 2017-10-22 LAB — PROTIME-INR
INR: 1.08
Prothrombin Time: 13.9 seconds (ref 11.4–15.2)

## 2017-10-22 LAB — PHOSPHORUS: PHOSPHORUS: 3.2 mg/dL (ref 2.5–4.6)

## 2017-10-22 LAB — MAGNESIUM: MAGNESIUM: 2.1 mg/dL (ref 1.7–2.4)

## 2017-10-22 LAB — VALPROIC ACID LEVEL: Valproic Acid Lvl: <10 ug/mL — ABNORMAL LOW (ref 50.0–100.0)

## 2017-10-22 MED ORDER — POTASSIUM CHLORIDE 20 MEQ PO PACK
40.0000 meq | PACK | Freq: Once | ORAL | Status: AC
  Administered 2017-10-22: 40 meq via ORAL
  Filled 2017-10-22: qty 2

## 2017-10-22 MED ORDER — VALPROATE SODIUM 500 MG/5ML IV SOLN
500.0000 mg | Freq: Three times a day (TID) | INTRAVENOUS | Status: DC
  Administered 2017-10-22 – 2017-10-23 (×3): 500 mg via INTRAVENOUS
  Filled 2017-10-22 (×7): qty 5

## 2017-10-22 MED ORDER — AMLODIPINE BESYLATE 10 MG PO TABS
10.0000 mg | ORAL_TABLET | Freq: Every day | ORAL | Status: DC
  Administered 2017-10-24 – 2017-10-27 (×3): 10 mg
  Filled 2017-10-22 (×3): qty 1

## 2017-10-22 MED ORDER — POTASSIUM CHLORIDE 10 MEQ/50ML IV SOLN
10.0000 meq | INTRAVENOUS | Status: DC
  Filled 2017-10-22 (×4): qty 50

## 2017-10-22 MED ORDER — HEPARIN SODIUM (PORCINE) 5000 UNIT/ML IJ SOLN
5000.0000 [IU] | Freq: Three times a day (TID) | INTRAMUSCULAR | Status: AC
  Administered 2017-10-22 (×2): 5000 [IU] via SUBCUTANEOUS
  Filled 2017-10-22 (×2): qty 1

## 2017-10-22 MED ORDER — AMLODIPINE BESYLATE 10 MG PO TABS
10.0000 mg | ORAL_TABLET | Freq: Every day | ORAL | Status: DC
  Administered 2017-10-22: 10 mg via ORAL
  Filled 2017-10-22: qty 1

## 2017-10-22 MED ORDER — POTASSIUM CHLORIDE 10 MEQ/100ML IV SOLN
10.0000 meq | INTRAVENOUS | Status: AC
  Administered 2017-10-22 (×4): 10 meq via INTRAVENOUS
  Filled 2017-10-22 (×4): qty 100

## 2017-10-22 NOTE — Progress Notes (Signed)
CRITICAL CARE NOTE  CC  follow up respiratory failure/vent dependent/self extubated x 2   BRIEF PATIENT DESCRIPTION:  84 yo male admitted with acute on chronic renal failure post cardiac arrest mechanically intubated hypothermic protocol initiated _0  degrees C  SIGNIFICANT EVENTS/STUDIES:  09/30-Pt admitted to ICU post cardiac arrest mechanically intubated hypothermic protocol initiated by ER provider 09/30-CT Head and Cervical Spine revealed no acute intracranial abnormality. No skull fracture. Unchanged atrophy, chronic small vessel ischemia, and left parietal encephalomalacia. Advanced multilevel degenerative change in the cervical spine without evidence of acute fracture. Soft tissue prominence involving the right hypopharynx is only partially included in the field of view, this may represent retained secretions or asymmetric positioning of the patient's tongue, however underlying mass lesion is not excluded. Consider contrast enhanced CT of the neck as clinically indicated. Moderate right pleural effusion partially included in the field of view. 10/2-Echo revealed EF 65% 10/3-CT Head revealed no evident anoxic injury.Atrophy and remote left frontal parietal infarct 10/10-Failure to wean from vent and difficult intubation upon reintubation therefore ENT consulted for tracheostomy placement  10/14: S/p Trach 10/21/2017: Seen by GI plan for PEG  on 10/23/2017, x-ray of foot was okay, podiatry evaluated- no immediate intervention,  Subjective: Overall patient is doing well no major overnight event noted. Off antibiotics now  No fever  Currently on PRVC 16/500 -ABG was reviewed and essentially okay - Plan was to taper off the sedation however currently patient is on Precedex 1 and fentanyl of 350 - -5.4 L noted - Potassium of 3.4 replaced TSH was okay  BP (!) 168/61   Pulse 64   Temp (!) 97.2 F (36.2 C) (Oral)   Resp (!) 27   Ht _1  (1.905 m)   Wt 119.6 kg   SpO2 98%   BMI  32.96 kg/m    REVIEW OF SYSTEMS  PATIENT IS UNABLE TO PROVIDE COMPLETE REVIEW OF SYSTEMS DUE TO SEVERE CRITICAL ILLNESS   PHYSICAL EXAMINATION:  GENERAL:critically ill appearing, HEAD: Normocephalic, atraumatic.  EYES: Pupils equal, round, reactive to light.  No scleral icterus.  MOUTH: Moist mucosal membrane. NECK: Supple. No thyromegaly. S/P trach PULMONARY:CTA with decreases breath sounds at bases without rhonci or wheezing CARDIOVASCULAR: S1 and S2. Regular rate and rhythm. No murmurs, rubs, or gallops.  GASTROINTESTINAL: Soft, nontender, but tense . No masses. Positive bowel sounds. No hepatosplenomegaly.  MUSCULOSKELETAL: No swelling, clubbing, or edema.  NEUROLOGIC: sedated heavily SKIN: Left toe dressing and right foot dressing noted   INTAKE/OUTPUT  Intake/Output Summary (Last 24 hours) at 10/22/2017 0837 Last data filed at 10/22/2017 0600 Gross per 24 hour  Intake 2069.04 ml  Output 4950 ml  Net -2880.96 ml    LABS  CBC Recent Labs  Lab 10/20/17 0320 10/21/17 0427 10/22/17 0334  WBC 5.2 5.7 6.7  HGB 8.3* 9.1* 8.8*  HCT 27.9* 30.8* 29.7*  PLT 316 368 398   Coag's Recent Labs  Lab 10/22/17 0334  INR 1.08   BMET Recent Labs  Lab 10/20/17 0320 10/21/17 0427 10/22/17 0334  NA 142 141 140  K 3.6 3.5 3.4*  CL 103 101 103  CO2 32 31 31  BUN 41* 44* 38*  CREATININE 0.74 0.77 0.74  GLUCOSE 112* 133* 191*   Electrolytes Recent Labs  Lab 10/18/17 0516  10/20/17 0320 10/21/17 0427 10/22/17 0334  CALCIUM 8.8*   < > 8.7* 8.7* 9.1  MG 1.8  --  1.7 2.1 2.1  PHOS 3.7  --   --   --  3.2   < > = values in this interval not displayed.   Sepsis Markers No results for input(s): LATICACIDVEN, PROCALCITON, O2SATVEN in the last 168 hours. ABG Recent Labs  Lab 10/22/17 0328  PHART 7.49*  PCO2ART 44  PO2ART 97   Liver Enzymes Recent Labs  Lab 10/16/17 0715 10/22/17 0334  AST  --  28  ALT  --  20  ALKPHOS  --  88  BILITOT  --  0.2*  ALBUMIN  2.4* 2.6*   Cardiac Enzymes No results for input(s): TROPONINI, PROBNP in the last 168 hours. Glucose Recent Labs  Lab 10/21/17 1119 10/21/17 1602 10/21/17 1931 10/21/17 2344 10/22/17 0349 10/22/17 0712  GLUCAP 150* 118* 145* 180* 187* 130*     Recent Results (from the past 240 hour(s))  Culture, respiratory (non-expectorated)     Status: None   Collection Time: 10/13/17 12:50 PM  Result Value Ref Range Status   Specimen Description   Final    TRACHEAL ASPIRATE Performed at [facility], [address] [place], [place] [address]    Special Requests   Final    NONE Performed at [facility], [address] [place], [place] [address]    Gram Stain   Final    FEW WBC PRESENT, PREDOMINANTLY PMN MODERATE GRAM POSITIVE RODS FEW GRAM VARIABLE ROD FEW GRAM NEGATIVE RODS RARE GRAM POSITIVE COCCI    Culture   Final    Consistent with normal respiratory flora. Performed at [facility], [place] [address] [place], [place] [address]    Report Status 10/15/2017 FINAL  Final    MEDICATIONS   Current Facility-Administered Medications:  .  0.9 %  sodium chloride infusion, , Intravenous, PRN, Tukov-Yual, Magdalene S, NP, Last Rate: 0 mL/hr at 10/19/17 1356 .  acetaminophen (TYLENOL) tablet 650 mg, 650 mg, Oral, Q6H PRN **OR** acetaminophen (TYLENOL) suppository 650 mg, 650 mg, Rectal, Q6H PRN, Amelia Jo, MD .  ALPRAZolam Duanne Moron) tablet 0.5 mg, 0.5 mg, Oral, TID, Flora Lipps, MD, 0.5 mg at 10/21/17 2113 .  amLODipine (NORVASC) tablet 10 mg, 10 mg, Oral, Daily, Manuella Ghazi, Keiran Sias, MD .  atropine 1 MG/10ML injection 1 mg, 1 mg, Intravenous, PRN, Awilda Bill, NP, 1 mg at 10/07/17 1824 .  bisacodyl (DULCOLAX) EC tablet 5 mg, 5 mg, Oral, Daily PRN, Amelia Jo, MD .  chlorhexidine gluconate (MEDLINE KIT) (PERIDEX) 0.12 % solution 15 mL, 15 mL, Mouth Rinse, BID, Blakeney, Dreama Saa, NP, 15 mL at 10/22/17 0755 .  dexmedetomidine (PRECEDEX) 400 MCG/100ML (4  mcg/mL) infusion, 0.4-1.2 mcg/kg/hr, Intravenous, Titrated, Lahoma Rocker, MD, Last Rate: 24.2 mL/hr at 10/22/17 0600, 0.8 mcg/kg/hr at 10/22/17 0600 .  famotidine (PEPCID) tablet 20 mg, 20 mg, Per Tube, BID, Charlett Nose, RPH, 20 mg at 10/21/17 2114 .  feeding supplement (PRO-STAT SUGAR FREE 64) liquid 60 mL, 60 mL, Per Tube, QID, Lahoma Rocker, MD, 60 mL at 10/21/17 2113 .  feeding supplement (VITAL HIGH PROTEIN) liquid 1,000 mL, 1,000 mL, Per Tube, Q24H, Manuella Ghazi, Zameer Borman, MD, 1,000 mL at 10/21/17 1741 .  fentaNYL (SUBLIMAZE) bolus via infusion 25-100 mcg, 25-100 mcg, Intravenous, Q1H PRN, Awilda Bill, NP, 100 mcg at 10/22/17 0546 .  fentaNYL 2559mg in NS 2521m(1061mml) infusion-PREMIX, 25-100 mcg/hr, Intravenous, Continuous, ShaLahoma RockerD, Last Rate: 35 mL/hr at 10/22/17 0754, 350 mcg/hr at 10/22/17 0754 .  free water 150 mL, 150 mL, Per Tube, Q4H, ShaManuella Ghaziutul, MD, 150 mL at 10/22/17 0400 .  furosemide (  LASIX) injection 40 mg, 40 mg, Intravenous, BID, Conforti, John, DO, 40 mg at 10/22/17 0800 .  heparin injection 5,000 Units, 5,000 Units, Subcutaneous, Q8H, Amelia Jo, MD, 5,000 Units at 10/22/17 0533 .  hydrALAZINE (APRESOLINE) injection 10-20 mg, 10-20 mg, Intravenous, Q2H PRN, Rosine Door, MD, 10 mg at 10/22/17 0506 .  Influenza vac split quadrivalent PF (FLUZONE HIGH-DOSE) injection 0.5 mL, 0.5 mL, Intramuscular, Tomorrow-1000, Blakeney, Dana G, NP .  insulin aspart (novoLOG) injection 0-20 Units, 0-20 Units, Subcutaneous, Q4H, Tukov-Yual, Magdalene S, NP, 3 Units at 10/22/17 0800 .  insulin detemir (LEVEMIR) injection 10 Units, 10 Units, Subcutaneous, BID, Conforti, John, DO, 10 Units at 10/21/17 2114 .  MEDLINE mouth rinse, 15 mL, Mouth Rinse, 10 times per day, Awilda Bill, NP, 15 mL at 10/22/17 0544 .  midazolam (VERSED) injection 1 mg, 1 mg, Intravenous, Q2H PRN, Awilda Bill, NP, 1 mg at 10/22/17 0554 .  multivitamin liquid 15 mL, 15 mL, Per Tube, Daily, Flora Lipps, MD, 15 mL at 10/21/17 0956 .  ondansetron (ZOFRAN) tablet 4 mg, 4 mg, Oral, Q6H PRN **OR** ondansetron (ZOFRAN) injection 4 mg, 4 mg, Intravenous, Q6H PRN, Amelia Jo, MD .  potassium chloride 10 mEq in 50 mL *CENTRAL LINE* IVPB, 10 mEq, Intravenous, Q1 Hr x 4, Zackarey Holleman, MD .  propofol (DIPRIVAN) 1000 MG/100ML infusion, 5-80 mcg/kg/min, Intravenous, Continuous, Blakeney, Dana G, NP, Last Rate: 7.77 mL/hr at 10/21/17 1412, 10 mcg/kg/min at 10/21/17 1412 .  traZODone (DESYREL) tablet 25 mg, 25 mg, Per Tube, QHS PRN, Darel Hong D, NP, 25 mg at 10/20/17 2132 .  valproic acid (DEPAKENE) solution 250 mg, 250 mg, Per Tube, Q8H, Charlett Nose, RPH, 250 mg at 10/22/17 0533      Indwelling Urinary Catheter continued, requirement due to   Reason to continue Indwelling Urinary Catheter for strict Intake/Output monitoring for hemodynamic instability   Central Line continued, requirement due to   Reason to continue [employer] Monitoring of central venous pressure or other hemodynamic parameters   Ventilator continued, requirement due to, resp failure    Ventilator Sedation RASS 0 to -2     ASSESSMENT AND PLAN SYNOPSIS Acute respiratory failure secondary to LLL pneumonia and pulmonary edema  Mechanical intubation s/p cardiac arrest  Hx: COPD and Obesity  Full vent support-vent Prn bronchodilator therapy-currently placed on pressure support of 12 doing well plan to do sedation vacation and try to taper off pressure support consider trach collar if able to tolerate VAP bundle implemented S/P trach-10/20/2017 Discussed with nursing regarding sedation vacation  Acute on chronic diastolic CHF Cardiac Arrest (cardiac rhythm unknown)-hypothermic protocol completed Bradycardia-resolved Hx: HTN, CAD, and Hyperlipidemia  Continuous telemetry monitoring  Maintain map >65 IV lasix as bp permits 40 mg SAY--3.0 L, cardiology following appreciate input -Blood pressure was  found to be elevated will add Norvasc back Prn hydralazine for bp control  Acute on chronic renal failure with hyperkalemia-resolved Metabolic acidosis-resolved  Trend BMP-- Replace K Replace electrolytes as indicated  Monitor UOP  Avoid nephrotoxic medications   LLL pneumonia  Diabetic Ulcers Bilateral Lower Extremities Trend WBC and monitor fever curve  Finished meropenum/vanco Monitor if further worsening of sepsis will rpt culture and start antibiotics again Podiatry consult for foot-suggested wound looks okay and no further acute intervention Xray foot-showed soft tissue swelling no evidence of osteomyelitis no further recommendation except wound dressing per podiatry  Anemia without obvious acute blood loss  Trend CBC  Monitor for s/sx of bleeding  Transfuse for hgb <7 VTE GX:IVHS heparin-kept on hold from tonight for PEG placement tomorrow  Nutrition Constipation  SUP px: pepcid Continue TF's  Continue bowel regimen -Plan for PEG tube placement on 10/23/2017  Hyperglycemiadoing better SSI  Acute encephalopathy Agitation/Delirium Hx: Dementia, Depression, and Anxiety Maintain RASS goal 0 to -1 Fentanyl gtt and prn versed to maintain RASS goal  Currently on precedex plan is to taper off fentanyl   Skin/Wound: Foot dressing   Electrolytes: Replace electrolytes per ICU electrolyte replacement protocol.   IVF: none  Nutrition: Tube feeds as tolerated GI consult for peg placement  Prophylaxis: DVT Prophylaxis with Heparin,. GI Prophylaxis.   Restraints: Soft limb to prevent self extubation  PT/OT eval and treat. OOB when appropriate.   Lines/Tubes:  15 days per urology foley  No central line.  ADVANCE DIRECTIVE:Full code  FAMILY DISCUSSION:Palliative following   Quality Care: PPI, DVT prophylaxis, HOB elevated, Infection control all reviewed and addressed.  Events and notes from last 24 hours reviewed. Care plan discussed on  multidisciplinary rounds  CC TIME:35 min    Old records reviewed discussed results and management plan with patient  Images personally reviewed and results and labs reviewed and discussed with patient.  All medication reviewed and adjusted  Further management depending on test results and work up as outlined above.   Lahoma Rocker, MD  10/22/2017 8:37 AM [facility]

## 2017-10-22 NOTE — Consult Note (Signed)
Pharmacy Electrolyte Monitoring Consult:  Pharmacy consulted to assist in monitoring and replacing electrolytes in this 84 y.o. male admitted on 10/06/2017 with Cardiac Arrest   Labs:  Sodium (mmol/L)  Date Value  10/22/2017 140  07/04/2013 133 (L)   Potassium (mmol/L)  Date Value  10/22/2017 3.4 (L)  07/04/2013 4.3   Magnesium (mg/dL)  Date Value  16/10/9602 2.1   Phosphorus (mg/dL)  Date Value  54/09/8117 3.2   Calcium (mg/dL)  Date Value  14/78/2956 9.1   Calcium, Total (mg/dL)  Date Value  21/30/8657 8.7   Albumin (g/dL)  Date Value  84/69/6295 2.6 (L)  07/04/2013 3.6    Assessment/Plan: 1. Electrolytes: Patient ordered furosemide 40mg  IV BID. Patient received KCl 10 mEq IV x 4. Will order additional potassium VT x 1.   2. Glucose mangagment: Continue Levemir 10 units BID and high dose SSI Q4hr. Patient receiving continuous tube feeds. Patient will be NPO after midnight. Will plan on holding evening dose of Levemir.   3. Constipation Management: Flexiseal in place.   Pharmacy will continue to monitor and adjust per consult.   Simpson,Michael L, 10/22/2017 4:23 PM

## 2017-10-23 ENCOUNTER — Inpatient Hospital Stay: Payer: Self-pay

## 2017-10-23 ENCOUNTER — Inpatient Hospital Stay: Payer: Medicare Other | Admitting: Anesthesiology

## 2017-10-23 ENCOUNTER — Encounter: Payer: Self-pay | Admitting: Gastroenterology

## 2017-10-23 ENCOUNTER — Encounter
Admission: EM | Disposition: A | Discharge status: Nursing Home (Includes ICF) | Payer: Self-pay | Source: Skilled Nursing Facility | Attending: Internal Medicine

## 2017-10-23 ENCOUNTER — Inpatient Hospital Stay: Payer: Medicare Other

## 2017-10-23 DIAGNOSIS — R1310 Dysphagia, unspecified: Secondary | ICD-10-CM

## 2017-10-23 HISTORY — PX: PEG PLACEMENT: SHX5437

## 2017-10-23 LAB — GLUCOSE, CAPILLARY
GLUCOSE-CAPILLARY: 138 mg/dL — AB (ref 70–99)
GLUCOSE-CAPILLARY: 161 mg/dL — AB (ref 70–99)
GLUCOSE-CAPILLARY: 85 mg/dL (ref 70–99)
Glucose-Capillary: 144 mg/dL — ABNORMAL HIGH (ref 70–99)
Glucose-Capillary: 165 mg/dL — ABNORMAL HIGH (ref 70–99)
Glucose-Capillary: 195 mg/dL — ABNORMAL HIGH (ref 70–99)

## 2017-10-23 LAB — BLOOD GAS, ARTERIAL
Acid-Base Excess: 10.6 mmol/L — ABNORMAL HIGH (ref 0.0–2.0)
Bicarbonate: 35.1 mmol/L — ABNORMAL HIGH (ref 20.0–28.0)
FIO2: 0.3
O2 Saturation: 94.3 %
PEEP: 5 cmH2O
PH ART: 7.49 — AB (ref 7.350–7.450)
PRESSURE SUPPORT: 12 cmH2O
Patient temperature: 37
pCO2 arterial: 46 mmHg (ref 32.0–48.0)
pO2, Arterial: 66 mmHg — ABNORMAL LOW (ref 83.0–108.0)

## 2017-10-23 LAB — CBC WITH DIFFERENTIAL/PLATELET
ABS IMMATURE GRANULOCYTES: 0.02 10*3/uL (ref 0.00–0.07)
Basophils Absolute: 0.1 10*3/uL (ref 0.0–0.1)
Basophils Relative: 1 %
Eosinophils Absolute: 0.4 10*3/uL (ref 0.0–0.5)
Eosinophils Relative: 6 %
HCT: 28.6 % — ABNORMAL LOW (ref 39.0–52.0)
HEMOGLOBIN: 8.6 g/dL — AB (ref 13.0–17.0)
IMMATURE GRANULOCYTES: 0 %
LYMPHS PCT: 9 %
Lymphs Abs: 0.6 10*3/uL — ABNORMAL LOW (ref 0.7–4.0)
MCH: 25.1 pg — AB (ref 26.0–34.0)
MCHC: 30.1 g/dL (ref 30.0–36.0)
MCV: 83.6 fL (ref 80.0–100.0)
MONO ABS: 0.6 10*3/uL (ref 0.1–1.0)
MONOS PCT: 8 %
NEUTROS ABS: 5.7 10*3/uL (ref 1.7–7.7)
NEUTROS PCT: 76 %
Platelets: 378 10*3/uL (ref 150–400)
RBC: 3.42 MIL/uL — AB (ref 4.22–5.81)
RDW: 16.9 % — ABNORMAL HIGH (ref 11.5–15.5)
WBC: 7.5 10*3/uL (ref 4.0–10.5)
nRBC: 0 % (ref 0.0–0.2)

## 2017-10-23 LAB — COMPREHENSIVE METABOLIC PANEL
ALK PHOS: 86 U/L (ref 38–126)
ALT: 19 U/L (ref 0–44)
AST: 27 U/L (ref 15–41)
Albumin: 2.7 g/dL — ABNORMAL LOW (ref 3.5–5.0)
Anion gap: 8 (ref 5–15)
BUN: 39 mg/dL — AB (ref 8–23)
CALCIUM: 9.4 mg/dL (ref 8.9–10.3)
CHLORIDE: 103 mmol/L (ref 98–111)
CO2: 30 mmol/L (ref 22–32)
CREATININE: 0.7 mg/dL (ref 0.61–1.24)
GFR calc non Af Amer: >60 mL/min (ref >60)
GLUCOSE: 125 mg/dL — AB (ref 70–99)
Potassium: 3.7 mmol/L (ref 3.5–5.1)
SODIUM: 141 mmol/L (ref 135–145)
Total Bilirubin: 0.3 mg/dL (ref 0.3–1.2)
Total Protein: 6.8 g/dL (ref 6.5–8.1)

## 2017-10-23 LAB — PHOSPHORUS: PHOSPHORUS: 3.5 mg/dL (ref 2.5–4.6)

## 2017-10-23 LAB — MAGNESIUM: Magnesium: 1.8 mg/dL (ref 1.7–2.4)

## 2017-10-23 SURGERY — INSERTION, PEG TUBE
Anesthesia: General

## 2017-10-23 MED ORDER — FUROSEMIDE 10 MG/ML IJ SOLN
20.0000 mg | Freq: Two times a day (BID) | INTRAMUSCULAR | Status: DC
  Administered 2017-10-23 – 2017-10-24 (×2): 20 mg via INTRAVENOUS
  Filled 2017-10-23 (×2): qty 2

## 2017-10-23 MED ORDER — MAGNESIUM SULFATE 2 GM/50ML IV SOLN
2.0000 g | Freq: Once | INTRAVENOUS | Status: AC
  Administered 2017-10-23: 2 g via INTRAVENOUS
  Filled 2017-10-23: qty 50

## 2017-10-23 MED ORDER — MAGNESIUM SULFATE 2 GM/50ML IV SOLN: 2.0000 g | Freq: Once | INTRAVENOUS | Status: DC

## 2017-10-23 MED ORDER — PROPOFOL 500 MG/50ML IV EMUL
INTRAVENOUS | Status: DC | PRN
  Administered 2017-10-23: 100 ug/kg/min via INTRAVENOUS

## 2017-10-23 MED ORDER — PRO-STAT SUGAR FREE PO LIQD
60.0000 mL | Freq: Two times a day (BID) | ORAL | Status: DC
  Administered 2017-10-24 – 2017-10-27 (×7): 60 mL

## 2017-10-23 MED ORDER — LORAZEPAM 2 MG/ML IJ SOLN
2.0000 mg | Freq: Once | INTRAMUSCULAR | Status: AC
  Administered 2017-10-23: 2 mg via INTRAVENOUS
  Filled 2017-10-23: qty 1

## 2017-10-23 MED ORDER — HYDRALAZINE HCL 20 MG/ML IJ SOLN
10.0000 mg | INTRAMUSCULAR | Status: DC | PRN
  Administered 2017-10-23 – 2017-10-27 (×7): 20 mg via INTRAVENOUS
  Filled 2017-10-23 (×7): qty 1

## 2017-10-23 MED ORDER — MIDAZOLAM HCL 2 MG/2ML IJ SOLN: 2.0000 mg | Freq: Once | INTRAMUSCULAR | Status: DC

## 2017-10-23 MED ORDER — VALPROATE SODIUM 500 MG/5ML IV SOLN
500.0000 mg | Freq: Three times a day (TID) | INTRAVENOUS | Status: DC
  Administered 2017-10-23 – 2017-10-24 (×3): 500 mg via INTRAVENOUS
  Filled 2017-10-23 (×5): qty 5

## 2017-10-23 MED ORDER — VITAL 1.5 CAL PO LIQD
1000.0000 mL | ORAL | Status: DC
  Administered 2017-10-24 – 2017-10-26 (×3): 1000 mL

## 2017-10-23 MED ORDER — POTASSIUM CHLORIDE 10 MEQ/100ML IV SOLN
10.0000 meq | INTRAVENOUS | Status: DC
  Filled 2017-10-23 (×4): qty 100

## 2017-10-23 MED ORDER — MIDAZOLAM HCL 2 MG/2ML IJ SOLN
4.0000 mg | Freq: Once | INTRAMUSCULAR | Status: AC
  Administered 2017-10-23: 4 mg via INTRAVENOUS
  Filled 2017-10-23: qty 4

## 2017-10-23 MED ORDER — VANCOMYCIN HCL 10 G IV SOLR
1500.0000 mg | Freq: Once | INTRAVENOUS | Status: AC
  Administered 2017-10-23: 1500 mg via INTRAVENOUS
  Filled 2017-10-23: qty 1500

## 2017-10-23 MED ORDER — SODIUM CHLORIDE 0.9% FLUSH
10.0000 mL | Freq: Two times a day (BID) | INTRAVENOUS | Status: DC
  Administered 2017-10-23 – 2017-10-27 (×6): 10 mL

## 2017-10-23 MED ORDER — ALPRAZOLAM 1 MG PO TABS
1.0000 mg | ORAL_TABLET | Freq: Three times a day (TID) | ORAL | Status: DC
Start: 2017-10-23 — End: 2017-10-26
  Administered 2017-10-24 – 2017-10-25 (×6): 1 mg via ORAL
  Filled 2017-10-23 (×7): qty 1

## 2017-10-23 MED ORDER — MIDAZOLAM HCL 2 MG/2ML IJ SOLN
INTRAMUSCULAR | Status: AC
  Administered 2017-10-23: 2 mg
  Filled 2017-10-23: qty 2

## 2017-10-23 MED ORDER — QUETIAPINE FUMARATE 25 MG PO TABS: 25.0000 mg | ORAL_TABLET | Freq: Every day | ORAL | Status: DC

## 2017-10-23 MED ORDER — SODIUM CHLORIDE 0.9% FLUSH: 10.0000 mL | INTRAVENOUS | Status: DC | PRN

## 2017-10-23 MED ORDER — FREE WATER
120.0000 mL | Status: DC
  Administered 2017-10-24 – 2017-10-27 (×18): 120 mL

## 2017-10-23 MED ORDER — POTASSIUM CHLORIDE 10 MEQ/100ML IV SOLN
10.0000 meq | INTRAVENOUS | Status: AC
  Administered 2017-10-23 – 2017-10-24 (×4): 10 meq via INTRAVENOUS
  Filled 2017-10-23: qty 100

## 2017-10-23 MED ORDER — DEXTROSE-NACL 5-0.45 % IV SOLN
INTRAVENOUS | Status: DC
  Administered 2017-10-23: 18:00:00 via INTRAVENOUS

## 2017-10-23 MED ORDER — PROPOFOL 10 MG/ML IV BOLUS
INTRAVENOUS | Status: DC | PRN
  Administered 2017-10-23: 30 mg via INTRAVENOUS
  Administered 2017-10-23: 40 mg via INTRAVENOUS

## 2017-10-23 NOTE — Progress Notes (Signed)
Dr Wyn Quaker at bedside, states to titrate esmolol for systolic bp between 150 to 90.

## 2017-10-23 NOTE — Progress Notes (Signed)
RN notified Dr Sherryll Burger that patient easily agitated even on continuous sedation, requiring PRN pain/sedation meds in addition, nonviolent restraints in place.  Patient plan for PEG placement today but consent has not been signed,  Per Night RN, does not know if MD has spoken to family regarding PEG placement, no family at bedside at this time.  Dr Sherryll Burger states waits for GI MD regarding consent and to defer sedation vacation today.

## 2017-10-23 NOTE — Anesthesia Preprocedure Evaluation (Addendum)
Anesthesia Evaluation    Airway Mallampati: Trach       Dental   Pulmonary sleep apnea , COPD,  COPD inhaler and oxygen dependent,           Cardiovascular hypertension, + CAD and +CHF       Neuro/Psych PSYCHIATRIC DISORDERS Anxiety Depression Dementia    GI/Hepatic GERD  ,  Endo/Other  diabetes  Renal/GU Renal disease     Musculoskeletal   Abdominal   Peds  Hematology  (+) Blood dyscrasia, anemia ,   Anesthesia Other Findings 84 yo male admitted post cardiac arrest, mechanically intubated with failure to wean from vent, s/p trach placement 10/14, acute on chronic renal failure, presenting for PEG placement.  Anesthesia consent obtained over the phone from pt's brother Solly Derasmo at 1413 on 10/23/17.  Discussed that pt has a higher risk of a cardiac event under GA given recent cardiac arrest.  Pt's brother understands and wishes to proceed.  Reproductive/Obstetrics                            Anesthesia Physical Anesthesia Plan  ASA: IV  Anesthesia Plan: General   Post-op Pain Management:    Induction:   PONV Risk Score and Plan: Propofol infusion  Airway Management Planned:   Additional Equipment:   Intra-op Plan:   Post-operative Plan:   Informed Consent:   Plan Discussed with:   Anesthesia Plan Comments:         Anesthesia Quick Evaluation

## 2017-10-23 NOTE — Progress Notes (Signed)
RN notified Dr Sherryll Burger that per Dr Servando Snare, PEG tube can be use 4 hours after procedure IF bowel sounds are active.  Dr Sherryll Burger states to starts D5 1/2 NS when PICC line is placed.  Unable to run IVPB at this time as patient only has 1 IV access right now. Dr acknowledge that Magnesium and potassium are still pending.

## 2017-10-23 NOTE — Op Note (Signed)
[facility] Patient Name: Billy Liu Procedure Date: 10/23/2017 2:33 PM MRN: [id number] Account #: [id number] Date of Birth: [birth year] Admit Type: Outpatient Age: 84 Room: [facility] ENDO ROOM 4 Gender: Male Note Status: Finalized Procedure:            Upper GI endoscopy Indications:          Dysphagia Providers:            Midge Minium MD, MD Referring MD:         No Local Md, MD (Referring MD) Medicines:            Propofol per Anesthesia Complications:        No immediate complications. Procedure:            Pre-Anesthesia Assessment:                       - Prior to the procedure, a History and Physical was                        performed, and patient medications and allergies were                        reviewed. The patient's tolerance of previous                        anesthesia was also reviewed. The risks and benefits of                        the procedure and the sedation options and risks were                        discussed with the patient. All questions were                        answered, and informed consent was obtained. Prior                        Anticoagulants: The patient has taken no previous                        anticoagulant or antiplatelet agents. ASA Grade                        Assessment: IV - A patient with severe systemic disease                        that is a constant threat to life. After reviewing the                        risks and benefits, the patient was deemed in                        satisfactory condition to undergo the procedure.                       After obtaining informed consent, the endoscope was                        passed under direct vision. Throughout the procedure,  the patient's blood pressure, pulse, and oxygen                        saturations were monitored continuously. The Endoscope                        was introduced through the mouth, and advanced to  the                        second part of duodenum. The upper GI endoscopy was                        accomplished without difficulty. The patient tolerated                        the procedure well. Findings:      The examined esophagus was normal.      The entire examined stomach was normal. Placement of an endoscopically       removable PEG with no T-fasteners was successfully completed. The       external bumper was at the 3.0 cm marking on the tube.      The examined duodenum was normal. Impression:           - Normal esophagus.                       - Normal stomach.                       - Normal examined duodenum.                       - An endoscopically removable PEG placement was                        successfully completed.                       - No specimens collected. Recommendation:       - Please follow the post-PEG recommendations including:                        Nutrition consult for formula and volume, advance food                        and medications per primary care provider and NPO x4                        hrs then water today. Procedure Code(s):    --- Professional ---                       [id number], Esophagogastroduodenoscopy, flexible, transoral;                        with directed placement of percutaneous gastrostomy tube Diagnosis Code(s):    --- Professional ---                       R13.10, Dysphagia, unspecified CPT copyright 2018 American Medical Association. All rights reserved. The codes documented in this report are preliminary and upon coder review may  be revised to meet current compliance requirements.  Midge Minium MD, MD 10/23/2017 3:05:51 PM This report has been signed electronically. Number of Addenda: 0 Note Initiated On: 10/23/2017 2:33 PM      [facility]

## 2017-10-23 NOTE — Anesthesia Post-op Follow-up Note (Signed)
Anesthesia QCDR form completed.        

## 2017-10-23 NOTE — Progress Notes (Signed)
Patient ID: Billy Liu, male   DOB: 12-20-1932, 84 y.o.   MRN: [id number]  [facility] PROGRESS NOTE  Billy Liu ONG:[id number] DOB: 10-06-1932 DOA: 10/06/2017 PCP: Alvester Morin, MD  HPI/Subjective: Status post trach, scheduled for PEG placement today.  Feeding on hold.  Objective: Vitals:   10/23/17 1100 10/23/17 1200  BP: (!) 150/62 (!) 167/73  Pulse: (!) 59 63  Resp:    Temp:  (!) 96.9 F (36.1 C)  SpO2: 97% 98%    Filed Weights   10/21/17 0500 10/22/17 0452 10/23/17 0500  Weight: 121.1 kg 119.6 kg 119.6 kg    ROS: Review of Systems  Unable to perform ROS: Acuity of condition   Exam: Physical Exam  Constitutional: He is intubated.  HENT:  Nose: Mucosal edema present.  Unable to look into mouth  Eyes: Conjunctivae and lids are normal.  Pupils pinpoint  Neck: Carotid bruit is not present.  Cardiovascular: Regular rhythm, S1 normal, S2 normal and normal heart sounds.  Respiratory: He is intubated. He has decreased breath sounds in the right lower field and the left lower field. He has no wheezes. He has rhonchi in the right lower field and the left lower field.  GI: Bowel sounds are normal. There is no tenderness.  Musculoskeletal:       Right ankle: He exhibits swelling.       Left ankle: He exhibits swelling.  Skin: Skin is warm.      Data Reviewed: Basic Metabolic Panel: Recent Labs  Lab 10/18/17 0516 10/19/17 0423 10/20/17 0320 10/21/17 0427 10/22/17 0334 10/23/17 1034  NA 144 143 142 141 140 141  K 4.1 3.6 3.6 3.5 3.4* 3.7  CL 107 105 103 101 103 103  CO2 31 31 32 '31 31 30  ' GLUCOSE 138* 136* 112* 133* 191* 125*  BUN 38* 39* 41* 44* 38* 39*  CREATININE 0.77 0.81 0.74 0.77 0.74 0.70  CALCIUM 8.8* 8.8* 8.7* 8.7* 9.1 9.4  MG 1.8  --  1.7 2.1 2.1 1.8  PHOS 3.7  --   --   --  3.2 3.5   Liver Function Tests: Recent Labs  Lab 10/22/17 0334 10/23/17 1034  AST 28 27  ALT 20 19  ALKPHOS 88 86  BILITOT 0.2* 0.3  PROT 6.8  6.8  ALBUMIN 2.6* 2.7*   CBC: Recent Labs  Lab 10/19/17 0423 10/20/17 0320 10/21/17 0427 10/22/17 0334 10/23/17 1034  WBC 5.1 5.2 5.7 6.7 7.5  NEUTROABS 3.0 3.1 3.5 4.5 5.7  HGB 8.9* 8.3* 9.1* 8.8* 8.6*  HCT 30.3* 27.9* 30.8* 29.7* 28.6*  MCV 87.3 85.1 86.8 84.4 83.6  PLT 305 316 368 398 378   Cardiac Enzymes: No results for input(s): CKTOTAL, CKMB, CKMBINDEX, TROPONINI in the last 168 hours.  CBG: Recent Labs  Lab 10/22/17 2001 10/22/17 2349 10/23/17 0420 10/23/17 0738 10/23/17 1201  GLUCAP 251* 188* 144* 85 138*    No results found for this or any previous visit (from the past 240 hour(s)).   Studies: Dg Chest Port 1 View  Result Date: 10/23/2017 CLINICAL DATA:  Short of breath EXAM: PORTABLE CHEST 1 VIEW COMPARISON:  10/22/2017 FINDINGS: Tracheostomy in good position.  NG tube in the stomach. Bibasilar airspace disease unchanged. Airspace disease more severe on the left than the right. Small pleural effusions unchanged. IMPRESSION: Bibasilar atelectasis/infiltrate and bilateral effusions unchanged from the prior study. Electronically Signed   By: Franchot Gallo M.D.   On: 10/23/2017 07:12   Dg Chest Port 1  View  Result Date: 10/22/2017 CLINICAL DATA:  84 year old male with a history of shortness of breath EXAM: PORTABLE CHEST 1 VIEW COMPARISON:  10/21/2017, 10/19/2017 FINDINGS: Cardiomediastinal silhouette unchanged in size and contour. Unchanged tracheostomy. Unchanged gastric tube, terminating out of the field of view. Veiled opacities of the bilateral lungs with obscuration the bilateral hemidiaphragm. Patchy opacities at the lung bases. No pneumothorax. IMPRESSION: Similar appearance of the chest x-ray with layered bilateral pleural effusions and associated atelectasis/consolidation. Unchanged gastric tube and tracheostomy Electronically Signed   By: Corrie Mckusick D.O.   On: 10/22/2017 07:57   [place] Ekg Site Rite  Result Date: 10/23/2017 If Site Rite image not  attached, placement could not be confirmed due to current cardiac rhythm.   Scheduled Meds: . ALPRAZolam  0.5 mg Oral TID  . amLODipine  10 mg Per Tube Daily  . chlorhexidine gluconate (MEDLINE KIT)  15 mL Mouth Rinse BID  . famotidine  20 mg Per Tube BID  . feeding supplement (PRO-STAT SUGAR FREE 64)  60 mL Per Tube QID  . feeding supplement (VITAL HIGH PROTEIN)  1,000 mL Per Tube Q24H  . free water  150 mL Per Tube Q4H  . furosemide  40 mg Intravenous BID  . Influenza vac split quadrivalent PF  0.5 mL Intramuscular Tomorrow-1000  . insulin aspart  0-20 Units Subcutaneous Q4H  . mouth rinse  15 mL Mouth Rinse 10 times per day  . midazolam  2 mg Intravenous Once  . multivitamin  15 mL Per Tube Daily   Continuous Infusions: . sodium chloride 0 mL/hr at 10/19/17 1356  . dexmedetomidine (PRECEDEX) IV infusion 1.2 mcg/kg/hr (10/23/17 1017)  . dextrose 5 % and 0.45% NaCl    . fentaNYL infusion INTRAVENOUS 100 mcg/hr (10/23/17 1000)  . valproate sodium 500 mg (10/23/17 0549)    Assessment/Plan:  1. Cardiac arrest.  Patient underwent hypothermia protocol.  Continue supportive care.  MRI shows old stroke neurology saw the patient, supportive care, patient seen by neurology, had EEG, showed background slowing.,  Family want trach and PEG, patient had trach, going for PEG today after that considering transferring to [facility]. 2. ventilator dependent respiratory failure,   continue trach care, bronchodilators,  3. right lower extremity ulceration with foul-smelling discharge, left first toe ulceration and swelling of the first toe likely chronic infection.  Continue IV vancomycin, meropenem.  Wound cultures are positive for MRSA 4. Acute kidney injur y and hyperkalemia.   This has improved. 5. History of chronic diastolic congestive heart failure, seen by cardiology.   Lasix 40 IV twice daily. 6. Overall prognosis poor and high risk for cardiopulmonary arrest.   7. Anemia.  Responded to  transfusion.   #8 diabetes mellitus type 2: Tube feeding on hold for PEG today, hold the Levemir, continue D5 until he gets PEG tube today  9,History of dementia, patient is on Depakote   10. acute encephalopathy, agitation, delirium, dementia, depression, anxiety: Patient is on fentanyl, Versed. . High risk for cardiac arrest.  Condition critical .family wants full code.  Status post trach, scheduled for PEG today   disposition possible transfer to LTAC with trach and PEG Code Status:     Code Status Orders  (From admission, onward)         Start     Ordered   10/07/17 0132  Full code  Continuous     10/07/17 0131        Code Status History    Date Active Date  Inactive Code Status Order ID Comments User Context   10/07/2017 0031 10/07/2017 0131 Full Code [id number]  Awilda Bill, NP ED   06/17/2017 1856 06/23/2017 2244 Full Code [id number]  Nicholes Mango, MD Inpatient   02/18/2017 1623 02/18/2017 2133 Full Code [id number]  Delana Meyer, Dolores Lory, MD Inpatient   02/11/2017 1138 02/11/2017 1736 Full Code [id number]  Delana Meyer, Dolores Lory, MD Inpatient   10/24/2014 1934 10/31/2014 1837 Full Code [id number]  Fritzi Mandes, MD Inpatient   10/11/2014 2347 10/17/2014 1844 Full Code [id number]  Lance Coon, MD Inpatient     Family Communication: As per critical care team Disposition Plan: To be determined  Consultants:  Critical care specialist  Antibiotics:  Meropenem  Time spent: 38 minutes [place]

## 2017-10-23 NOTE — Progress Notes (Signed)
Patient still agitated but unable to go up any more on fentanyl gtt, precedex is at max at this time. RN notified Dr Sherryll Burger, MD gave order for 4mg  versed x1, MD does not want to go up sedation at this time.

## 2017-10-23 NOTE — Progress Notes (Signed)
RN notified Dr Sherryll Burger that patient fsbs is 138, sliding scale called for 3 unit of insulin but patient has been NPO pending PEG placement, unable to start D5 1/2NS at this time due to request from Anesthesia MD to not start D51/2 until after PEG placement as MD needs a free flowing line of NS.  Dr Sherryll Burger states to hold insulin for now.

## 2017-10-23 NOTE — Progress Notes (Signed)
CRITICAL CARE NOTE  CC  follow up respiratory failure/vent dependent/self extubated x 2   BRIEF PATIENT DESCRIPTION:  84 yo male admitted with acute on chronic renal failure post cardiac arrest mechanically intubated hypothermic protocol initiated '@36'  degrees C  SIGNIFICANT EVENTS/STUDIES:  09/30-Pt admitted to ICU post cardiac arrest mechanically intubated hypothermic protocol initiated by ER provider 09/30-CT Head and Cervical Spine revealed no acute intracranial abnormality. No skull fracture. Unchanged atrophy, chronic small vessel ischemia, and left parietal encephalomalacia. Advanced multilevel degenerative change in the cervical spine without evidence of acute fracture. Soft tissue prominence involving the right hypopharynx is only partially included in the field of view, this may represent retained secretions or asymmetric positioning of the patient's tongue, however underlying mass lesion is not excluded. Consider contrast enhanced CT of the neck as clinically indicated. Moderate right pleural effusion partially included in the field of view. 10/2-Echo revealed EF 65% 10/3-CT Head revealed no evident anoxic injury.Atrophy and remote left frontal parietal infarct 10/10-Failure to wean from vent and difficult intubation upon reintubation therefore ENT consulted for tracheostomy placement  10/14: S/p Trach 10/21/2017: x-ray of foot was okay, podiatry evaluated- no immediate intervention, 10/23/2017: Status post PEG tube placement, status post PICC line placement, awaiting from select approval  Subjective: Overall patient is doing well no major overnight event noted. Off antibiotics now  No fever  Currently on pressure support of 12 with PEEP of 5 -patient gets on and off agitated and restless difficult to manage hopefully once the pack -ABG was reviewed and essentially okay (ABG was done on pressure support ventilation - Plan was to taper off the sedation however currently patient is  on Precedex 1 and fentanyl of 100 also needed to go propofol and for the procedure, and use Versed as needed - - 8.8 L noted - Potassium of 3.4 replaced TSH was okay  BP (!) 167/73   Pulse 63   Temp (!) 96.9 F (36.1 C) (Axillary)   Resp 18   Ht '6\' 3"'  (1.905 m)   Wt 119.6 kg   SpO2 98%   BMI 32.96 kg/m    REVIEW OF SYSTEMS  PATIENT IS UNABLE TO PROVIDE COMPLETE REVIEW OF SYSTEMS DUE TO SEVERE CRITICAL ILLNESS   PHYSICAL EXAMINATION:  GENERAL:critically ill appearing, HEAD: Normocephalic, atraumatic.  EYES: Pupils equal, round, reactive to light.  No scleral icterus.  MOUTH: Moist mucosal membrane. NECK: Supple. No thyromegaly. S/P trach PULMONARY:CTA with decreases breath sounds at bases without rhonci or wheezing CARDIOVASCULAR: S1 and S2. Regular rate and rhythm. No murmurs, rubs, or gallops.  GASTROINTESTINAL: Soft, nontender, but tense . No masses. Positive bowel sounds. No hepatosplenomegaly.  MUSCULOSKELETAL: No swelling, clubbing, or edema.  NEUROLOGIC: sedated heavily SKIN: Left toe dressing and right foot dressing noted   INTAKE/OUTPUT  Intake/Output Summary (Last 24 hours) at 10/23/2017 1658 Last data filed at 10/23/2017 1000 Gross per 24 hour  Intake 1318.1 ml  Output 2375 ml  Net -1056.9 ml    LABS  CBC Recent Labs  Lab 10/21/17 0427 10/22/17 0334 10/23/17 1034  WBC 5.7 6.7 7.5  HGB 9.1* 8.8* 8.6*  HCT 30.8* 29.7* 28.6*  PLT 368 398 378   Coag's Recent Labs  Lab 10/22/17 0334  INR 1.08   BMET Recent Labs  Lab 10/21/17 0427 10/22/17 0334 10/23/17 1034  NA 141 140 141  K 3.5 3.4* 3.7  CL 101 103 103  CO2 '31 31 30  ' BUN 44* 38* 39*  CREATININE 0.77 0.74 0.70  GLUCOSE 133* 191* 125*   Electrolytes Recent Labs  Lab 10/18/17 0516  10/21/17 0427 10/22/17 0334 10/23/17 1034  CALCIUM 8.8*   < > 8.7* 9.1 9.4  MG 1.8   < > 2.1 2.1 1.8  PHOS 3.7  --   --  3.2 3.5   < > = values in this interval not displayed.   Sepsis Markers No  results for input(s): LATICACIDVEN, PROCALCITON, O2SATVEN in the last 168 hours. ABG Recent Labs  Lab 10/22/17 0328 10/23/17 0435  PHART 7.49* 7.49*  PCO2ART 44 46  PO2ART 97 66*   Liver Enzymes Recent Labs  Lab 10/22/17 0334 10/23/17 1034  AST 28 27  ALT 20 19  ALKPHOS 88 86  BILITOT 0.2* 0.3  ALBUMIN 2.6* 2.7*   Cardiac Enzymes No results for input(s): TROPONINI, PROBNP in the last 168 hours. Glucose Recent Labs  Lab 10/22/17 1550 10/22/17 2001 10/22/17 2349 10/23/17 0420 10/23/17 0738 10/23/17 1201  GLUCAP 197* 251* 188* 144* 85 138*     No results found for this or any previous visit (from the past 240 hour(s)).  MEDICATIONS   Current Facility-Administered Medications:  .  0.9 %  sodium chloride infusion, , Intravenous, PRN, Tukov-Yual, Magdalene S, NP, Last Rate: 0 mL/hr at 10/19/17 1356 .  acetaminophen (TYLENOL) tablet 650 mg, 650 mg, Oral, Q6H PRN **OR** acetaminophen (TYLENOL) suppository 650 mg, 650 mg, Rectal, Q6H PRN, Amelia Jo, MD .  ALPRAZolam Duanne Moron) tablet 0.5 mg, 0.5 mg, Oral, TID, Flora Lipps, MD, 0.5 mg at 10/22/17 2129 .  amLODipine (NORVASC) tablet 10 mg, 10 mg, Per Tube, Daily, Vianne Bulls, Snehalatha, MD .  atropine 1 MG/10ML injection 1 mg, 1 mg, Intravenous, PRN, Awilda Bill, NP, 1 mg at 10/07/17 1824 .  bisacodyl (DULCOLAX) EC tablet 5 mg, 5 mg, Oral, Daily PRN, Amelia Jo, MD .  chlorhexidine gluconate (MEDLINE KIT) (PERIDEX) 0.12 % solution 15 mL, 15 mL, Mouth Rinse, BID, Blakeney, Dreama Saa, NP, 15 mL at 10/23/17 0915 .  dexmedetomidine (PRECEDEX) 400 MCG/100ML (4 mcg/mL) infusion, 0.4-1.2 mcg/kg/hr, Intravenous, Titrated, Lahoma Rocker, MD, Last Rate: 36.3 mL/hr at 10/23/17 1640, 1.2 mcg/kg/hr at 10/23/17 1640 .  dextrose 5 %-0.45 % sodium chloride infusion, , Intravenous, Continuous, Manuella Ghazi, Kendrick Remigio, MD .  famotidine (PEPCID) tablet 20 mg, 20 mg, Per Tube, BID, Charlett Nose, RPH, 20 mg at 10/22/17 2130 .  feeding supplement  (PRO-STAT SUGAR FREE 64) liquid 60 mL, 60 mL, Per Tube, BID, Lahoma Rocker, MD .  Derrill Memo ON 10/24/2017] feeding supplement (VITAL 1.5 CAL) liquid 1,000 mL, 1,000 mL, Per Tube, Continuous, Manuella Ghazi, Juniel Groene, MD .  fentaNYL (SUBLIMAZE) bolus via infusion 25-100 mcg, 25-100 mcg, Intravenous, Q1H PRN, Awilda Bill, NP, 100 mcg at 10/23/17 1642 .  fentaNYL 2578mg in NS 2514m(1035mml) infusion-PREMIX, 25-100 mcg/hr, Intravenous, Continuous, ShaLahoma RockerD, Last Rate: 10 mL/hr at 10/23/17 1000, 100 mcg/hr at 10/23/17 1000 .  free water 120 mL, 120 mL, Per Tube, Q4H, ShaManuella Ghaziutul, MD .  furosemide (LASIX) injection 40 mg, 40 mg, Intravenous, BID, Conforti, John, DO, 40 mg at 10/23/17 0921194 hydrALAZINE (APRESOLINE) injection 10-20 mg, 10-20 mg, Intravenous, Q2H PRN, ShaManuella Ghaziutul, MD .  Influenza vac split quadrivalent PF (FLUZONE HIGH-DOSE) injection 0.5 mL, 0.5 mL, Intramuscular, Tomorrow-1000, Blakeney, Dana G, NP .  insulin aspart (novoLOG) injection 0-20 Units, 0-20 Units, Subcutaneous, Q4H, Tukov-Yual, Magdalene S, NP, Stopped at 10/23/17 1221 .  magnesium sulfate IVPB 2 g 50 mL, 2 g, Intravenous, Once,  Cyndee Brightly [place], [place] .  MEDLINE mouth rinse, 15 mL, Mouth Rinse, 10 times per day, Awilda Bill, NP, 15 mL at 10/23/17 1320 .  midazolam (VERSED) injection 1 mg, 1 mg, Intravenous, Q2H PRN, Awilda Bill, NP, 1 mg at 10/23/17 0743 .  midazolam (VERSED) injection 2 mg, 2 mg, Intravenous, Once, Darel Hong D, NP .  multivitamin liquid 15 mL, 15 mL, Per Tube, Daily, Flora Lipps, MD, 15 mL at 10/22/17 0909 .  ondansetron (ZOFRAN) tablet 4 mg, 4 mg, Oral, Q6H PRN **OR** ondansetron (ZOFRAN) injection 4 mg, 4 mg, Intravenous, Q6H PRN, Amelia Jo, MD .  potassium chloride 10 mEq in 100 mL IVPB, 10 mEq, Intravenous, Q1 Hr x 4, Cyndee Brightly M,  .  traZODone (DESYREL) tablet 25 mg, 25 mg, Per Tube, QHS PRN, Darel Hong D, NP, 25 mg at 10/22/17 2129 .  valproate (DEPACON) 500 mg in  dextrose 5 % 50 mL IVPB, 500 mg, Intravenous, Q8H, Lahoma Rocker, MD, Last Rate: 55 mL/hr at 10/23/17 0549, 500 mg at 10/23/17 0549 .  vancomycin (VANCOCIN) 1,500 mg in sodium chloride 0.9 % 500 mL IVPB, 1,500 mg, Intravenous, Once, Lucilla Lame, MD, Last Rate: 250 mL/hr at 10/23/17 1538, 1,500 mg at 10/23/17 1538      Indwelling Urinary Catheter continued, requirement due to   Reason to continue Indwelling Urinary Catheter for strict Intake/Output monitoring for hemodynamic instability   Central Line continued, requirement due to   Reason to continue [employer] Monitoring of central venous pressure or other hemodynamic parameters   Ventilator continued, requirement due to, resp failure    Ventilator Sedation RASS 0 to -2     ASSESSMENT AND PLAN SYNOPSIS Acute respiratory failure secondary to LLL pneumonia and pulmonary edema  Mechanical intubation s/p cardiac arrest  Hx: COPD and Obesity  Full vent support-vent Prn bronchodilator therapy-currently placed on pressure support of 12 doing well plan to do sedation vacation and try to taper off pressure support consider trach collar if able to tolerate VAP bundle implemented S/P trach-10/20/2017 Discussed with nursing regarding sedation vacation  Acute on chronic diastolic CHF Cardiac Arrest (cardiac rhythm unknown)-hypothermic protocol completed Bradycardia-resolved Hx: HTN, CAD, and Hyperlipidemia  Continuous telemetry monitoring  Maintain map >65 IV lasix as bp permits  20 mg MWN--0.2 L, cardiology following appreciate input -Blood pressure was found to be elevated will add Norvasc back Prn hydralazine for bp control  Acute on chronic renal failure with hyperkalemia-resolved Metabolic acidosis-resolved  Trend BMP-- Replace K Replace electrolytes as indicated  Monitor UOP  Avoid nephrotoxic medications   LLL pneumonia  Diabetic Ulcers Bilateral Lower Extremities Trend WBC and monitor fever curve  Finished  meropenum/vanco Monitor if further worsening of sepsis will rpt culture and start antibiotics again Podiatry consult for foot-suggested wound looks okay and no further acute intervention Xray foot-showed soft tissue swelling no evidence of osteomyelitis no further recommendation except wound dressing per podiatry  Anemia without obvious acute blood loss  Trend CBC  Monitor for s/sx of bleeding  Transfuse for hgb <7 VTE VO:ZDGU heparin resume tonight  Nutrition Constipation  SUP px: pepcid Continue TF's per PEG placement continue bowel regimen   Hyperglycemiadoing better SSI  Acute encephalopathy Agitation/Delirium Hx: Dementia, Depression, and Anxiety Maintain RASS goal 0 to -1 Fentanyl gtt and prn versed to maintain RASS goal  Currently on precedex plan is to taper off fentanyl   Skin/Wound: Foot dressing   Electrolytes: Replace electrolytes per ICU electrolyte replacement protocol.  IVF: none  Nutrition: Tube feeds as tolerated GI consult for peg placement  Prophylaxis: DVT Prophylaxis with Heparin,. GI Prophylaxis.   Restraints: Soft limb to prevent self extubation  PT/OT eval and treat. OOB when appropriate.   Lines/Tubes:  16 days per urology foley  No central line.  ADVANCE DIRECTIVE:Full code  FAMILY DISCUSSION:Palliative following   Quality Care: PPI, DVT prophylaxis, HOB elevated, Infection control all reviewed and addressed.  Events and notes from last 24 hours reviewed. Care plan discussed on multidisciplinary rounds  CC TIME:34 min    Old records reviewed discussed results and management plan with patient  Images personally reviewed and results and labs reviewed and discussed with patient.  All medication reviewed and adjusted  Further management depending on test results and work up as outlined above.   Lahoma Rocker, MD  10/23/2017 4:58 PM [facility]

## 2017-10-23 NOTE — Progress Notes (Signed)
Nutrition Follow-up  DOCUMENTATION CODES:   Obesity unspecified  INTERVENTION:  When able to use PEG tube for tube feeding, recommend initiating new goal TF regimen of Vital 1.5 at 45 mL/hr (1080 mL goal daily volume) + Pro-Stat 60 mL BID. Provides 2020 kcal, 133 grams of protein, 821 mL H2O daily.  Provide free water flush of 120 mL Q4hrs. Total of 1541 mL H2O daily including water in tube feeding.  Continue liquid MVI daily per tube.  NUTRITION DIAGNOSIS:   Inadequate oral intake related to inability to eat as evidenced by NPO status.  Ongoing - addressing with TF regimen.  GOAL:   Provide needs based on ASPEN/SCCM guidelines  Met with TF regimen.  MONITOR:   Vent status, Labs, Weight trends, TF tolerance, Skin, I & O's  REASON FOR ASSESSMENT:   Ventilator    ASSESSMENT:   84 year old male with PMHx of chronic diastolic CHF, COPD, CAD, DM, HTN, GERD, HLD, anxiety, depression, CKD stage III, dementia who is admitted from [facility] after cardiac arrest, intubated on 9/30, 36C TTM protocol initiated, with possible LLL PNA, pulmonary edema, acute on chronic renal failure.   -Patient s/p tracheostomy tube placement on 10/14. -Rectal tube placed on 10/15. -Plan is for PEG tube placement today.  Patient is ventilated through tracheostomy and sedated. On PSV with FiO2 30% and PEEP 5 cmH2O. Patient has now been on hypocaloric tube feeds for approximately 2 weeks. Will adjust to using PSU 2010 to estimate calorie needs.  Enteral Access: 14 FR. OGT placed 10/7; terminates in stomach per abdominal x-ray 10/7 ; 68 cm at corner of mouth  TF: tube feeds currently on hold for PEG tube placement; pt was previously tolerating VHP at 30 mL/hr + Pro-Stat 60 mL QID  Patient is currently intubated on ventilator support Ve: 9.3 L/min Temp (24hrs), Avg:97.3 F (36.3 C), Min:96.9 F (36.1 C), Max:97.8 F (36.6 C)  Propofol: N/A  Medications reviewed and include: Xanax 0.5 mg  TID, famotidine, free water flush 150 mL Q4hrs, Lasix 40 mg BID, Novolog 0-20 units Q4hrs, liquid MVI daily per tube, Precedex gtt, fentanyl gtt, magnesium sulfate 2 grams IV once today, potassium chloride 10 mEQ 4 times IV today, valproate.  Labs reviewed: CBG 85-138, BUN 39.  I/O: 4575 mL UOP yesterday (1.6 mL/kg/hr)  Discussed with RN and on rounds.  Diet Order:   Diet Order            Diet NPO time specified  Diet effective midnight              EDUCATION NEEDS:   No education needs have been identified at this time  Skin:  Skin Assessment: Skin Integrity Issues:(cellulitis and DM ulcers to bilateral lower extremities)  Last BM:  10/21/2017 - large type 7; rectal tube now in place  Height:   Ht Readings from Last 1 Encounters:  10/07/17 '6\' 3"'  (1.905 m)    Weight:   Wt Readings from Last 1 Encounters:  10/23/17 119.6 kg    Ideal Body Weight:  89.1 kg  BMI:  Body mass index is 32.96 kg/m.  Estimated Nutritional Needs:   Kcal:  2028 (PSU 2010 w/ MSJ 1982, Ve 9.3, Tmax 36.6)  Protein:  115-135 grams (1.3-1.5 grams/kg IBW)  Fluid:  1.5-1.8 L/day  Willey Blade, MS, RD, LDN Office: [phone] Pager: [phone] After Hours/Weekend Pager: [phone]

## 2017-10-23 NOTE — Consult Note (Addendum)
Pharmacy Electrolyte Monitoring Consult:  Pharmacy consulted to assist in monitoring and replacing electrolytes in this 84 y.o. male admitted on 10/06/2017 with Cardiac Arrest   Labs:  Sodium (mmol/L)  Date Value  10/23/2017 141  07/04/2013 133 (L)   Potassium (mmol/L)  Date Value  10/23/2017 3.7  07/04/2013 4.3   Magnesium (mg/dL)  Date Value  16/10/9602 1.8   Phosphorus (mg/dL)  Date Value  54/09/8117 3.5   Calcium (mg/dL)  Date Value  14/78/2956 9.4   Calcium, Total (mg/dL)  Date Value  21/30/8657 8.7   Albumin (g/dL)  Date Value  84/69/6295 2.7 (L)  07/04/2013 3.6    Assessment/Plan: 1. Electrolytes: Patient ordered furosemide 40mg  IV BID. Ordered KCl 10 mEq IV x 4 and Mg sulfate 2 g IV x 1 - waiting on PICC line for administration of electrolytes.  2. Glucose mangagment:  Continue SSI q4h. Tube feeds are on hold and patient is NPO for PEG placement today. Levemir held for now due to NPO status, should resume 10/18.  3. Constipation Management: Flexiseal in place.   Pharmacy will continue to monitor and adjust per consult.    Mauri Reading, PharmD Pharmacy Resident  10/23/2017 1:24 PM

## 2017-10-23 NOTE — Progress Notes (Signed)
Per Anesthesiology MD, hold off on starting D5-1/2NS continuous until after PEG placement.  MD wants to have a free flowing bag of NS y-sited to current infusions now.

## 2017-10-23 NOTE — Transfer of Care (Signed)
Immediate Anesthesia Transfer of Care Note  Patient: Billy Liu  Procedure(s) Performed: PERCUTANEOUS ENDOSCOPIC GASTROSTOMY (PEG) PLACEMENT (N/A )  Patient Location: PACU  Anesthesia Type:General  Level of Consciousness: pateint uncooperative and responds to stimulation consistent with baseline prior to GA  Airway & Oxygen Therapy: pt remains with trach on vent per anesthesia plan  Post-op Assessment: Report given to RN and Post -op Vital signs reviewed and stable  Post vital signs: Reviewed  Last Vitals:  Vitals Value Taken Time  BP 145/64 10/23/2017  3:00 PM  Temp    Pulse 60 10/23/2017  3:02 PM  Resp 15 10/23/2017  3:02 PM  SpO2 97 % 10/23/2017  3:02 PM  Vitals shown include unvalidated device data.  Last Pain:  Vitals:   10/23/17 1200  TempSrc: Axillary  PainSc:          Complications: No apparent anesthesia complications

## 2017-10-23 NOTE — Progress Notes (Signed)
Peripherally Inserted Central Catheter/Midline Placement  The IV Nurse has discussed with the patient and/or persons authorized to consent for the patient, the purpose of this procedure and the potential benefits and risks involved with this procedure.  The benefits include less needle sticks, lab draws from the catheter, and the patient may be discharged home with the catheter. Risks include, but not limited to, infection, bleeding, blood clot (thrombus formation), and puncture of an artery; nerve damage and irregular heartbeat and possibility to perform a PICC exchange if needed/ordered by physician.  Alternatives to this procedure were also discussed.  Bard Power PICC patient education guide, fact sheet on infection prevention and patient information card has been provided to patient /or left at bedside.    PICC/Midline Placement Documentation  PICC Triple Lumen 10/23/17 PICC Right Brachial 47 cm 0 cm (Active)  Indication for Insertion or Continuance of Line Poor Vasculature-patient has had multiple peripheral attempts or PIVs lasting less than 24 hours 10/23/2017  5:15 PM  Exposed Catheter (cm) 0 cm 10/23/2017  5:15 PM  Site Assessment Clean;Dry;Intact 10/23/2017  5:15 PM  Lumen #1 Status Flushed;Blood return noted 10/23/2017  5:15 PM  Lumen #2 Status Flushed;Blood return noted 10/23/2017  5:15 PM  Lumen #3 Status Flushed;Blood return noted 10/23/2017  5:15 PM  Dressing Type Transparent 10/23/2017  5:15 PM  Dressing Status Clean;Dry;Intact;Antimicrobial disc in place 10/23/2017  5:15 PM  Dressing Intervention New dressing 10/23/2017  5:15 PM  Dressing Change Due 10/30/17 10/23/2017  5:15 PM       Billy Liu 10/23/2017, 5:16 PM

## 2017-10-24 ENCOUNTER — Inpatient Hospital Stay: Payer: Medicare Other

## 2017-10-24 LAB — BLOOD GAS, ARTERIAL
Acid-Base Excess: 5.1 mmol/L — ABNORMAL HIGH (ref 0.0–2.0)
BICARBONATE: 29 mmol/L — AB (ref 20.0–28.0)
FIO2: 0.3
MECHVT: 500 mL
O2 Saturation: 95.4 %
PATIENT TEMPERATURE: 37
PEEP: 5 cmH2O
PO2 ART: 72 mmHg — AB (ref 83.0–108.0)
RATE: 15 resp/min
pCO2 arterial: 39 mmHg (ref 32.0–48.0)
pH, Arterial: 7.48 — ABNORMAL HIGH (ref 7.350–7.450)

## 2017-10-24 LAB — CBC WITH DIFFERENTIAL/PLATELET
ABS IMMATURE GRANULOCYTES: 0.02 10*3/uL (ref 0.00–0.07)
BASOS ABS: 0.1 10*3/uL (ref 0.0–0.1)
Basophils Relative: 1 %
Eosinophils Absolute: 0.4 10*3/uL (ref 0.0–0.5)
Eosinophils Relative: 6 %
HCT: 30.4 % — ABNORMAL LOW (ref 39.0–52.0)
Hemoglobin: 9.2 g/dL — ABNORMAL LOW (ref 13.0–17.0)
IMMATURE GRANULOCYTES: 0 %
Lymphocytes Relative: 13 %
Lymphs Abs: 1 10*3/uL (ref 0.7–4.0)
MCH: 25.1 pg — AB (ref 26.0–34.0)
MCHC: 30.3 g/dL (ref 30.0–36.0)
MCV: 83.1 fL (ref 80.0–100.0)
MONO ABS: 0.6 10*3/uL (ref 0.1–1.0)
Monocytes Relative: 8 %
NEUTROS ABS: 5.8 10*3/uL (ref 1.7–7.7)
NEUTROS PCT: 72 %
NRBC: 0 % (ref 0.0–0.2)
PLATELETS: 438 10*3/uL — AB (ref 150–400)
RBC: 3.66 MIL/uL — AB (ref 4.22–5.81)
RDW: 17.2 % — ABNORMAL HIGH (ref 11.5–15.5)
WBC: 7.9 10*3/uL (ref 4.0–10.5)

## 2017-10-24 LAB — COMPREHENSIVE METABOLIC PANEL
ALBUMIN: 3 g/dL — AB (ref 3.5–5.0)
ALK PHOS: 94 U/L (ref 38–126)
ALT: 20 U/L (ref 0–44)
AST: 24 U/L (ref 15–41)
Anion gap: 8 (ref 5–15)
BUN: 30 mg/dL — ABNORMAL HIGH (ref 8–23)
CALCIUM: 9.3 mg/dL (ref 8.9–10.3)
CHLORIDE: 101 mmol/L (ref 98–111)
CO2: 30 mmol/L (ref 22–32)
CREATININE: 0.87 mg/dL (ref 0.61–1.24)
GFR calc non Af Amer: >60 mL/min (ref >60)
GLUCOSE: 193 mg/dL — AB (ref 70–99)
Potassium: 4 mmol/L (ref 3.5–5.1)
SODIUM: 139 mmol/L (ref 135–145)
Total Bilirubin: 0.4 mg/dL (ref 0.3–1.2)
Total Protein: 7.9 g/dL (ref 6.5–8.1)

## 2017-10-24 LAB — GLUCOSE, CAPILLARY
GLUCOSE-CAPILLARY: 167 mg/dL — AB (ref 70–99)
GLUCOSE-CAPILLARY: 163 mg/dL — AB (ref 70–99)
GLUCOSE-CAPILLARY: 176 mg/dL — AB (ref 70–99)
Glucose-Capillary: 169 mg/dL — ABNORMAL HIGH (ref 70–99)
Glucose-Capillary: 146 mg/dL — ABNORMAL HIGH (ref 70–99)

## 2017-10-24 LAB — MAGNESIUM: Magnesium: 2.1 mg/dL (ref 1.7–2.4)

## 2017-10-24 LAB — VALPROIC ACID LEVEL: VALPROIC ACID LVL: 14 ug/mL — AB (ref 50.0–100.0)

## 2017-10-24 MED ORDER — INSULIN DETEMIR 100 UNIT/ML ~~LOC~~ SOLN
10.0000 [IU] | Freq: Two times a day (BID) | SUBCUTANEOUS | Status: DC
  Administered 2017-10-24 – 2017-10-27 (×6): 10 [IU] via SUBCUTANEOUS
  Filled 2017-10-24 (×8): qty 0.1

## 2017-10-24 MED ORDER — FUROSEMIDE 10 MG/ML PO SOLN
20.0000 mg | Freq: Two times a day (BID) | ORAL | Status: DC
  Administered 2017-10-24 – 2017-10-27 (×6): 20 mg via ORAL
  Filled 2017-10-24 (×8): qty 2

## 2017-10-24 MED ORDER — OLANZAPINE 5 MG PO TABS
5.0000 mg | ORAL_TABLET | Freq: Every day | ORAL | Status: DC
  Administered 2017-10-24 – 2017-10-26 (×3): 5 mg
  Filled 2017-10-24 (×3): qty 1

## 2017-10-24 MED ORDER — OLANZAPINE 5 MG PO TABS
5.0000 mg | ORAL_TABLET | Freq: Every day | ORAL | Status: DC
  Filled 2017-10-24: qty 1

## 2017-10-24 MED ORDER — HEPARIN SODIUM (PORCINE) 5000 UNIT/ML IJ SOLN
5000.0000 [IU] | Freq: Three times a day (TID) | INTRAMUSCULAR | Status: DC
  Administered 2017-10-24 – 2017-10-27 (×10): 5000 [IU] via SUBCUTANEOUS
  Filled 2017-10-24 (×10): qty 1

## 2017-10-24 MED ORDER — LISINOPRIL 20 MG PO TABS
20.0000 mg | ORAL_TABLET | Freq: Every day | ORAL | Status: DC
  Administered 2017-10-24 – 2017-10-27 (×3): 20 mg via ORAL
  Filled 2017-10-24 (×3): qty 1

## 2017-10-24 MED ORDER — LORAZEPAM 2 MG/ML IJ SOLN
INTRAMUSCULAR | Status: AC
  Administered 2017-10-24: 2 mg via INTRAVENOUS
  Filled 2017-10-24: qty 1

## 2017-10-24 MED ORDER — LORAZEPAM 2 MG/ML IJ SOLN
2.0000 mg | Freq: Once | INTRAMUSCULAR | Status: AC
  Administered 2017-10-24: 2 mg via INTRAVENOUS

## 2017-10-24 MED ORDER — VALPROIC ACID 250 MG/5ML PO SOLN
500.0000 mg | Freq: Three times a day (TID) | ORAL | Status: DC
  Administered 2017-10-24 – 2017-10-27 (×9): 500 mg
  Filled 2017-10-24 (×10): qty 10

## 2017-10-24 MED ORDER — QUETIAPINE FUMARATE 25 MG PO TABS
25.0000 mg | ORAL_TABLET | Freq: Every day | ORAL | Status: DC
  Administered 2017-10-24 – 2017-10-25 (×2): 25 mg
  Filled 2017-10-24 (×2): qty 1

## 2017-10-24 NOTE — Progress Notes (Signed)
PHARMACIST - PHYSICIAN COMMUNICATION  CONCERNING: IV to Oral Route Change Policy  RECOMMENDATION: This patient is receiving valproic acid by the intravenous route.  Based on criteria approved by the Pharmacy and Therapeutics Committee, the intravenous medication(s) is/are being converted to the equivalent oral dose form(s).   DESCRIPTION: These criteria include:  The patient is eating (either orally or via tube) and/or has been taking other orally administered medications for a least 24 hours  The patient has no evidence of active gastrointestinal bleeding or impaired GI absorption (gastrectomy, short bowel, patient on TNA or NPO).  If you have questions about this conversion, please contact the Pharmacy Department  []   [phone] )  [facility]  Simpson,Michael L, [facility] 10/24/2017 3:10 PM

## 2017-10-24 NOTE — Consult Note (Signed)
Pharmacy Electrolyte Monitoring Consult:  Pharmacy consulted to assist in monitoring and replacing electrolytes in this 84 y.o. male admitted on 10/06/2017 with Cardiac Arrest   Labs:  Sodium (mmol/L)  Date Value  10/24/2017 139  07/04/2013 133 (L)   Potassium (mmol/L)  Date Value  10/24/2017 4.0  07/04/2013 4.3   Magnesium (mg/dL)  Date Value  16/10/9602 2.1   Phosphorus (mg/dL)  Date Value  54/09/8117 3.5   Calcium (mg/dL)  Date Value  14/78/2956 9.3   Calcium, Total (mg/dL)  Date Value  21/30/8657 8.7   Albumin (g/dL)  Date Value  84/69/6295 3.0 (L)  07/04/2013 3.6    Assessment/Plan: 1. Electrolytes: Patient ordered furosemide 20mg  IV BID. Electrolytes are WNL today. No supplementation required. Will check with AM labs.   2. Glucose mangagment: BG 125-195 within last 24 hours. Continue SSI q4h. PEG placed yesterday. Tube feeds and Levemir are to resume today.   3. Constipation Management: Flexiseal in place.   Pharmacy will continue to monitor and adjust per consult.   Broadus John, PharmD Candidate 10/24/2017 12:53 PM

## 2017-10-24 NOTE — Progress Notes (Signed)
Patient ID: Billy Liu, male   DOB: Jul 03, 1932, 84 y.o.   MRN: [id number]  [facility] PROGRESS NOTE  Pavle Wiler BSJ:[id number] DOB: 1932-01-21 DOA: 10/06/2017 PCP: Alvester Morin, MD  HPI/Subjective:  Patient is status post trach and PEG. Objective: Vitals:   10/24/17 2014 10/24/17 2020  BP:    Pulse: (!) 53   Resp: 16 15  Temp:  97.7 F (36.5 C)  SpO2: 99% 99%    Filed Weights   10/22/17 0452 10/23/17 0500 10/24/17 0423  Weight: 119.6 kg 119.6 kg 117 kg    ROS: Review of Systems  Unable to perform ROS: Acuity of condition   Exam: Physical Exam  Constitutional: He is intubated.  HENT:  Nose: Mucosal edema present.  Unable to look into mouth  Eyes: Conjunctivae and lids are normal.  Pupils pinpoint  Neck: Carotid bruit is not present.  Cardiovascular: Regular rhythm, S1 normal, S2 normal and normal heart sounds.  Respiratory: He is intubated. He has decreased breath sounds in the right lower field and the left lower field. He has no wheezes. He has rhonchi in the right lower field and the left lower field.  GI: Bowel sounds are normal. There is no tenderness.  Musculoskeletal:       Right ankle: He exhibits swelling.       Left ankle: He exhibits swelling.  Skin: Skin is warm.      Data Reviewed: Basic Metabolic Panel: Recent Labs  Lab 10/18/17 0516  10/20/17 0320 10/21/17 0427 10/22/17 0334 10/23/17 1034 10/24/17 0832  NA 144   < > 142 141 140 141 139  K 4.1   < > 3.6 3.5 3.4* 3.7 4.0  CL 107   < > 103 101 103 103 101  CO2 31   < > 32 '31 31 30 30  ' GLUCOSE 138*   < > 112* 133* 191* 125* 193*  BUN 38*   < > 41* 44* 38* 39* 30*  CREATININE 0.77   < > 0.74 0.77 0.74 0.70 0.87  CALCIUM 8.8*   < > 8.7* 8.7* 9.1 9.4 9.3  MG 1.8  --  1.7 2.1 2.1 1.8 2.1  PHOS 3.7  --   --   --  3.2 3.5  --    < > = values in this interval not displayed.   Liver Function Tests: Recent Labs  Lab 10/22/17 0334 10/23/17 1034 10/24/17 0832  AST '28  27 24  ' ALT '20 19 20  ' ALKPHOS 88 86 94  BILITOT 0.2* 0.3 0.4  PROT 6.8 6.8 7.9  ALBUMIN 2.6* 2.7* 3.0*   CBC: Recent Labs  Lab 10/20/17 0320 10/21/17 0427 10/22/17 0334 10/23/17 1034 10/24/17 0832  WBC 5.2 5.7 6.7 7.5 7.9  NEUTROABS 3.1 3.5 4.5 5.7 5.8  HGB 8.3* 9.1* 8.8* 8.6* 9.2*  HCT 27.9* 30.8* 29.7* 28.6* 30.4*  MCV 85.1 86.8 84.4 83.6 83.1  PLT 316 368 398 378 438*   Cardiac Enzymes: No results for input(s): CKTOTAL, CKMB, CKMBINDEX, TROPONINI in the last 168 hours.  CBG: Recent Labs  Lab 10/24/17 0344 10/24/17 0733 10/24/17 1223 10/24/17 1611 10/24/17 1940  GLUCAP 167* 169* 163* 146* 176*    No results found for this or any previous visit (from the past 240 hour(s)).   Studies: Dg Chest Port 1 View  Result Date: 10/24/2017 CLINICAL DATA:  84 year old male in the ICU status post cardiac arrest. Tracheostomy tube placed. Shortness of breath. EXAM: PORTABLE CHEST 1 VIEW COMPARISON:  Portable  chest 10/23/2017 and earlier. FINDINGS: Portable AP upright view at 0402 hours. Stable tracheostomy tube. Enteric tube has been removed. A right side PICC line is now in place. Stable mild cardiomegaly. Other mediastinal contours are within normal limits. Regressed veiling opacity at both lung bases and improved basilar ventilation. No pneumothorax or pulmonary edema. Residual retrocardiac opacity. No pleural effusion is evident. IMPRESSION: 1. Enteric tube removed and right PICC line placed. 2. Improved bibasilar ventilation since yesterday. Residual retrocardiac atelectasis. Electronically Signed   By: Genevie Ann M.D.   On: 10/24/2017 07:09   Dg Chest Port 1 View  Result Date: 10/23/2017 CLINICAL DATA:  Short of breath EXAM: PORTABLE CHEST 1 VIEW COMPARISON:  10/22/2017 FINDINGS: Tracheostomy in good position.  NG tube in the stomach. Bibasilar airspace disease unchanged. Airspace disease more severe on the left than the right. Small pleural effusions unchanged. IMPRESSION:  Bibasilar atelectasis/infiltrate and bilateral effusions unchanged from the prior study. Electronically Signed   By: Franchot Gallo M.D.   On: 10/23/2017 07:12   [place] Ekg Site Rite  Result Date: 10/23/2017 If Site Rite image not attached, placement could not be confirmed due to current cardiac rhythm.   Scheduled Meds: . ALPRAZolam  1 mg Oral TID  . amLODipine  10 mg Per Tube Daily  . chlorhexidine gluconate (MEDLINE KIT)  15 mL Mouth Rinse BID  . famotidine  20 mg Per Tube BID  . feeding supplement (PRO-STAT SUGAR FREE 64)  60 mL Per Tube BID  . free water  120 mL Per Tube Q4H  . furosemide  20 mg Oral BID  . heparin injection (subcutaneous)  5,000 Units Subcutaneous Q8H  . Influenza vac split quadrivalent PF  0.5 mL Intramuscular Tomorrow-1000  . insulin aspart  0-20 Units Subcutaneous Q4H  . insulin detemir  10 Units Subcutaneous BID  . lisinopril  20 mg Oral Daily  . mouth rinse  15 mL Mouth Rinse 10 times per day  . multivitamin  15 mL Per Tube Daily  . OLANZapine  5 mg Per Tube Daily  . QUEtiapine  25 mg Per Tube QHS  . sodium chloride flush  10-40 mL Intracatheter Q12H  . valproic acid  500 mg Per Tube Q8H   Continuous Infusions: . sodium chloride 0 mL/hr at 10/19/17 1356  . dexmedetomidine (PRECEDEX) IV infusion 1.2 mcg/kg/hr (10/24/17 2036)  . dextrose 5 % and 0.45% NaCl Stopped (10/24/17 1119)  . feeding supplement (VITAL 1.5 CAL) Stopped (10/24/17 2044)  . fentaNYL infusion INTRAVENOUS Stopped (10/24/17 1758)    Assessment/Plan:  1. Cardiac arrest.  Patient underwent hypothermia protocol.  Continue supportive care.  MRI shows old stroke neurology saw the patient, supportive care, patient seen by neurology, had EEG, showed background slowing.,  Family want trach and PEG, patient had trach, going for PEG today after that considering transferring to [facility]. 2. ventilator dependent respiratory failure,   continue trach care, bronchodilators,  3. right lower extremity  ulceration with foul-smelling discharge, left first toe ulceration and swelling of the first toe likely chronic infection.  Continue IV vancomycin, meropenem.  Wound cultures are positive for MRSA 4. Acute kidney injur y and hyperkalemia.   This has improved. 5. History of chronic diastolic congestive heart failure, seen by cardiology.   Lasix 40 IV twice daily. 6. Overall prognosis poor and high risk for cardiopulmonary arrest.   7. Anemia.  Responded to transfusion.   #8 diabetes mellitus type 2: Continue Levemir 10 units twice daily 9,History of dementia,  patient is on Depakote Episodes of anxiety, patient has been on Xanax, Seroquel, has mittens on to help with treatments.  10. acute encephalopathy, agitation, delirium, dementia, depression, anxiety: Patient is on fentanyl, Versed. [name] #11 hypeenatremia: Improved with free water. High risk for cardiac arrest.  Condition critical .family wants full code.  Status post trach, PEG. disposition possible transfer to LTAC with trach and PEG Code Status:     Code Status Orders  (From admission, onward)         Start     Ordered   10/07/17 0132  Full code  Continuous     10/07/17 0131        Code Status History    Date Active Date Inactive Code Status Order ID Comments User Context   10/07/2017 0031 10/07/2017 0131 Full Code [id number]  Awilda Bill, NP ED   06/17/2017 1856 06/23/2017 2244 Full Code [id number]  Nicholes Mango, MD Inpatient   02/18/2017 1623 02/18/2017 2133 Full Code [id number]  Delana Meyer, Dolores Lory, MD Inpatient   02/11/2017 1138 02/11/2017 1736 Full Code [id number]  Schnier, Dolores Lory, MD Inpatient   10/24/2014 1934 10/31/2014 1837 Full Code [id number]  Fritzi Mandes, MD Inpatient   10/11/2014 2347 10/17/2014 1844 Full Code [id number]  Lance Coon, MD Inpatient     Family Communication: As per critical care team Disposition Plan: To be determined  Consultants:  Critical care specialist  Antibiotics:  Meropenem  Time spent:  38 minutes [place]

## 2017-10-24 NOTE — Progress Notes (Signed)
CRITICAL CARE NOTE  CC  follow up respiratory failure/vent dependent/self extubated x 2   BRIEF PATIENT DESCRIPTION:  84 yo male admitted with acute on chronic renal failure post cardiac arrest mechanically intubated hypothermic protocol initiated _0  degrees C  SIGNIFICANT EVENTS/STUDIES:  09/30-Pt admitted to ICU post cardiac arrest mechanically intubated hypothermic protocol initiated by ER provider 09/30-CT Head and Cervical Spine revealed no acute intracranial abnormality. No skull fracture. Unchanged atrophy, chronic small vessel ischemia, and left parietal encephalomalacia. Advanced multilevel degenerative change in the cervical spine without evidence of acute fracture. Soft tissue prominence involving the right hypopharynx is only partially included in the field of view, this may represent retained secretions or asymmetric positioning of the patient's tongue, however underlying mass lesion is not excluded. Consider contrast enhanced CT of the neck as clinically indicated. Moderate right pleural effusion partially included in the field of view. 10/2-Echo revealed EF 65% 10/3-CT Head revealed no evident anoxic injury.Atrophy and remote left frontal parietal infarct 10/10-Failure to wean from vent and difficult intubation upon reintubation therefore ENT consulted for tracheostomy placement  10/14: S/p Trach 10/21/2017: x-ray of foot was okay, podiatry evaluated- no immediate intervention, 10/23/2017: Status post PEG tube placement, status post PICC line placement, awaiting from [facility] approval 10/24/2017: Trying to taper sedation drip, added Zyprexa, slightly elevated blood pressure noted, -14 L Subjective: Overall patient is doing well no major overnight event noted. Off antibiotics now  No fever  Currently on full support plan is to taper the sedation and able to try to wean the patient -ABG was reviewed and essentially okay  --14 L TSH was okay  BP (!) 152/68   Pulse (!) 49    Temp (!) 97.5 F (36.4 C) (Axillary)   Resp 13   Ht _1  (1.905 m)   Wt 117 kg   SpO2 98%   BMI 32.24 kg/m    REVIEW OF SYSTEMS  PATIENT IS UNABLE TO PROVIDE COMPLETE REVIEW OF SYSTEMS DUE TO SEVERE CRITICAL ILLNESS   PHYSICAL EXAMINATION:  GENERAL:critically ill appearing, HEAD: Normocephalic, atraumatic.  EYES: Pupils equal, round, reactive to light.  No scleral icterus.  MOUTH: Moist mucosal membrane. NECK: Supple. No thyromegaly. S/P trach PULMONARY:CTA with decreases breath sounds at bases without rhonci or wheezing CARDIOVASCULAR: S1 and S2. Regular rate and rhythm. No murmurs, rubs, or gallops.  GASTROINTESTINAL: Soft, nontender, but tense . No masses. Positive bowel sounds. No hepatosplenomegaly.  MUSCULOSKELETAL: No swelling, clubbing, or edema.  NEUROLOGIC: sedated heavily SKIN: Left toe dressing and right foot dressing noted   INTAKE/OUTPUT  Intake/Output Summary (Last 24 hours) at 10/24/2017 1529 Last data filed at 10/24/2017 1448 Gross per 24 hour  Intake 665.24 ml  Output 2700 ml  Net -2034.76 ml    LABS  CBC Recent Labs  Lab 10/22/17 0334 10/23/17 1034 10/24/17 0832  WBC 6.7 7.5 7.9  HGB 8.8* 8.6* 9.2*  HCT 29.7* 28.6* 30.4*  PLT 398 378 438*   Coag's Recent Labs  Lab 10/22/17 0334  INR 1.08   BMET Recent Labs  Lab 10/22/17 0334 10/23/17 1034 10/24/17 0832  NA 140 141 139  K 3.4* 3.7 4.0  CL 103 103 101  CO2 _2 BUN 38* 39* 30*  CREATININE 0.74 0.70 0.87  GLUCOSE 191* 125* 193*   Electrolytes Recent Labs  Lab 10/18/17 0516  10/22/17 0334 10/23/17 1034 10/24/17 0832  CALCIUM 8.8*   < > 9.1 9.4 9.3  MG 1.8   < > 2.1 1.8  2.1  PHOS 3.7  --  3.2 3.5  --    < > = values in this interval not displayed.   Sepsis Markers No results for input(s): LATICACIDVEN, PROCALCITON, O2SATVEN in the last 168 hours. ABG Recent Labs  Lab 10/22/17 0328 10/23/17 0435 10/24/17 0439  PHART 7.49* 7.49* 7.48*  PCO2ART 44 46 39   PO2ART 97 66* 72*   Liver Enzymes Recent Labs  Lab 10/22/17 0334 10/23/17 1034 10/24/17 0832  AST _0 ALT _1 ALKPHOS 88 86 94  BILITOT 0.2* 0.3 0.4  ALBUMIN 2.6* 2.7* 3.0*   Cardiac Enzymes No results for input(s): TROPONINI, PROBNP in the last 168 hours. Glucose Recent Labs  Lab 10/23/17 1732 10/23/17 1924 10/23/17 2355 10/24/17 0344 10/24/17 0733 10/24/17 1223  GLUCAP 165* 195* 161* 167* 169* 163*     No results found for this or any previous visit (from the past 240 hour(s)).  MEDICATIONS   Current Facility-Administered Medications:  .  0.9 %  sodium chloride infusion, , Intravenous, PRN, Tukov-Yual, Magdalene S, NP, Last Rate: 0 mL/hr at 10/19/17 1356 .  acetaminophen (TYLENOL) tablet 650 mg, 650 mg, Oral, Q6H PRN **OR** acetaminophen (TYLENOL) suppository 650 mg, 650 mg, Rectal, Q6H PRN, Amelia Jo, MD .  ALPRAZolam Duanne Moron) tablet 1 mg, 1 mg, Oral, TID, Darel Hong D, NP, 1 mg at 10/24/17 1108 .  amLODipine (NORVASC) tablet 10 mg, 10 mg, Per Tube, Daily, Epifanio Lesches, MD, 10 mg at 10/24/17 1107 .  atropine 1 MG/10ML injection 1 mg, 1 mg, Intravenous, PRN, Awilda Bill, NP, 1 mg at 10/07/17 1824 .  bisacodyl (DULCOLAX) EC tablet 5 mg, 5 mg, Oral, Daily PRN, Amelia Jo, MD .  chlorhexidine gluconate (MEDLINE KIT) (PERIDEX) 0.12 % solution 15 mL, 15 mL, Mouth Rinse, BID, Awilda Bill, NP, 15 mL at 10/24/17 0823 .  dexmedetomidine (PRECEDEX) 400 MCG/100ML (4 mcg/mL) infusion, 0.4-1.2 mcg/kg/hr, Intravenous, Titrated, Lahoma Rocker, MD, Last Rate: 36.3 mL/hr at 10/24/17 1447, 1.2 mcg/kg/hr at 10/24/17 1447 .  dextrose 5 %-0.45 % sodium chloride infusion, , Intravenous, Continuous, Lahoma Rocker, MD, Stopped at 10/23/17 1819 .  famotidine (PEPCID) tablet 20 mg, 20 mg, Per Tube, BID, Charlett Nose, RPH, 20 mg at 10/24/17 1107 .  feeding supplement (PRO-STAT SUGAR FREE 64) liquid 60 mL, 60 mL, Per Tube, BID, Lahoma Rocker, MD, 60 mL at  10/24/17 1109 .  feeding supplement (VITAL 1.5 CAL) liquid 1,000 mL, 1,000 mL, Per Tube, Continuous, Lahoma Rocker, MD, Last Rate: 45 mL/hr at 10/24/17 1413, 1,000 mL at 10/24/17 1413 .  fentaNYL (SUBLIMAZE) bolus via infusion 25-100 mcg, 25-100 mcg, Intravenous, Q1H PRN, Awilda Bill, NP, 100 mcg at 10/24/17 0356 .  fentaNYL 2572mg in NS 2551m(1038mml) infusion-PREMIX, 25-100 mcg/hr, Intravenous, Continuous, ShaLahoma RockerD, Last Rate: 10 mL/hr at 10/24/17 0353, 100 mcg/hr at 10/24/17 0353 .  free water 120 mL, 120 mL, Per Tube, Q4H, ShaManuella Ghaziutul, MD, 120 mL at 10/24/17 1255 .  furosemide (LASIX) injection 20 mg, 20 mg, Intravenous, BID, ShaManuella Ghaziutul, MD, 20 mg at 10/24/17 [id number] heparin injection 5,000 Units, 5,000 Units, Subcutaneous, Q8H, ShaLahoma RockerD, 5,000 Units at 10/24/17 1445 .  hydrALAZINE (APRESOLINE) injection 10-20 mg, 10-20 mg, Intravenous, Q2H PRN, ShaLahoma RockerD, 20 mg at 10/24/17 0906 .  Influenza vac split quadrivalent PF (FLUZONE HIGH-DOSE) injection 0.5 mL, 0.5 mL, Intramuscular, Tomorrow-1000, Blakeney, Dana G, NP .  insulin aspart (novoLOG) injection 0-20 Units,  0-20 Units, Subcutaneous, Q4H, Tukov-Yual, Magdalene S, NP, 4 Units at 10/24/17 1255 .  insulin detemir (LEVEMIR) injection 10 Units, 10 Units, Subcutaneous, BID, Manuella Ghazi, Oswaldo Cueto, MD .  lisinopril (PRINIVIL,ZESTRIL) tablet 20 mg, 20 mg, Oral, Daily, Lahoma Rocker, MD, 20 mg at 10/24/17 1107 .  MEDLINE mouth rinse, 15 mL, Mouth Rinse, 10 times per day, Awilda Bill, NP, 15 mL at 10/24/17 1413 .  midazolam (VERSED) injection 1 mg, 1 mg, Intravenous, Q2H PRN, Awilda Bill, NP, 1 mg at 10/23/17 1844 .  multivitamin liquid 15 mL, 15 mL, Per Tube, Daily, Kasa, Kurian, MD, 15 mL at 10/24/17 1110 .  OLANZapine (ZYPREXA) tablet 5 mg, 5 mg, Per Tube, Daily, Lahoma Rocker, MD, 5 mg at 10/24/17 1110 .  ondansetron (ZOFRAN) tablet 4 mg, 4 mg, Oral, Q6H PRN **OR** ondansetron (ZOFRAN) injection 4 mg, 4 mg, Intravenous, Q6H  PRN, Amelia Jo, MD .  QUEtiapine (SEROQUEL) tablet 25 mg, 25 mg, Per Tube, QHS, Epifanio Lesches, MD .  sodium chloride flush (NS) 0.9 % injection 10-40 mL, 10-40 mL, Intracatheter, Q12H, Lahoma Rocker, MD, 10 mL at 10/23/17 2236 .  sodium chloride flush (NS) 0.9 % injection 10-40 mL, 10-40 mL, Intracatheter, PRN, Lahoma Rocker, MD .  traZODone (DESYREL) tablet 25 mg, 25 mg, Per Tube, QHS PRN, Bradly Bienenstock, NP, 25 mg at 10/22/17 2129 .  valproic acid (DEPAKENE) solution 500 mg, 500 mg, Per Tube, Q8H, Epifanio Lesches, MD      Indwelling Urinary Catheter continued, requirement due to   Reason to continue Indwelling Urinary Catheter for strict Intake/Output monitoring for hemodynamic instability   Central Line continued, requirement due to   Reason to continue [employer] Monitoring of central venous pressure or other hemodynamic parameters   Ventilator continued, requirement due to, resp failure    Ventilator Sedation RASS 0 to -2     ASSESSMENT AND PLAN SYNOPSIS Acute respiratory failure secondary to LLL pneumonia and pulmonary edema  Mechanical intubation s/p cardiac arrest  Hx: COPD and Obesity  Full vent support-vent Prn bronchodilator therapy- discussed with nursing at length about sedation vacation and tapering the sedation drips - Zyprexa was added as patient on and off becomes very agitated and difficult to manage - Plan to continue with Zyprexa and valproic acid and taper off Precedex and fentanyl that he is currently on VAP bundle implemented S/P trach-10/20/2017 Discussed with nursing regarding sedation vacation  Acute on chronic diastolic CHF Cardiac Arrest (cardiac rhythm unknown)-hypothermic protocol completed Bradycardia-resolved Hx: HTN, CAD, and Hyperlipidemia  Continuous telemetry monitoring  Maintain map >65 -Currently on IV Lasix with that 40 L negative we will switch it to 20 mg p.o. twice daily adjust per creatinine and  potassium -Blood pressure was found to be very elevated will add lisinopril to current Norvasc and use Prn hydralazine for bp control  Acute on chronic renal failure with hyperkalemia-resolved Metabolic acidosis-resolved  Trend BMP-- Replace K Replace electrolytes as indicated  Monitor UOP  Avoid nephrotoxic medications   LLL pneumonia  Diabetic Ulcers Bilateral Lower Extremities Trend WBC and monitor fever curve  Finished meropenum/vanco Monitor if further worsening of sepsis will rpt culture and start antibiotics again Podiatry consult for foot-suggested wound looks okay and no further acute intervention Xray foot-showed soft tissue swelling no evidence of osteomyelitis no further recommendation except wound dressing per podiatry  Anemia without obvious acute blood loss  Trend CBC  Monitor for s/sx of bleeding  Transfuse for hgb <7 VTE YQ:MGNO heparin resume  tonight  Nutrition Constipation  SUP px: pepcid Continue TF's per PEG placement continue bowel regimen   Hyperglycemiadoing better SSI  Acute encephalopathy Agitation/Delirium Hx: Dementia, Depression, and Anxiety Maintain RASS goal 0 to -1 Fentanyl gtt and prn versed to maintain RASS goal  Currently on precedex plan is to taper off fentanyl   Skin/Wound: Foot dressing   Electrolytes: Replace electrolytes per ICU electrolyte replacement protocol.   IVF: none  Nutrition: Tube feeds as tolerated per PEG  Prophylaxis: DVT Prophylaxis with Heparin,. GI Prophylaxis.   Restraints: Soft limb to prevent self extubation  PT/OT eval and treat. OOB when appropriate.   Lines/Tubes: 17 days per urology foley PICC line placed on 10/23/2017  ADVANCE DIRECTIVE:Full code  FAMILY DISCUSSION:Palliative following   Quality Care: PPI, DVT prophylaxis, HOB elevated, Infection control all reviewed and addressed.  Events and notes from last 24 hours reviewed. Care plan discussed on multidisciplinary  rounds  CC TIME:34 min  -[facility] will let us know and we will proceed with weaning and transfer   Old records reviewed discussed results and management plan with patient  Images personally reviewed and results and labs reviewed and discussed with patient.  All medication reviewed and adjusted  Further management depending on test results and work up as outlined above.   Lahoma Rocker, MD  10/24/2017 3:29 PM [facility]

## 2017-10-24 NOTE — Progress Notes (Signed)
NP notified that KUB resulted. Acknowledged and no new orders at present.

## 2017-10-24 NOTE — Progress Notes (Signed)
Tube feeds on hold for abd distention per NP. KUB ordered. Awaiting results.

## 2017-10-24 NOTE — Progress Notes (Signed)
Billy Lame, MD [facility]   [address], [place], [place] [address] Phone: [phone] Fax : [phone]   Subjective: Patient had a PEG tube placed yesterday.  The patient had an uneventful night except for decreased bowel sounds.  Tube feedings were not charted due to his decreased bowel sounds.   Objective: Vital signs in last 24 hours: Vitals:   10/24/17 0500 10/24/17 0600 10/24/17 0700 10/24/17 0832  BP: (!) 196/158 (!) 152/67 (!) 152/68   Pulse: 67 (!) 54 (!) 49   Resp: '17 13 13   ' Temp:      TempSrc:      SpO2: 100% 96% 100% 100%  Weight:      Height:       Weight change: -2.6 kg  Intake/Output Summary (Last 24 hours) at 10/24/2017 1025 Last data filed at 10/24/2017 2595 Gross per 24 hour  Intake 665.24 ml  Output 3400 ml  Net -2734.76 ml     Exam: Abdomen: Patient's abdomen soft nontender nondistended with positive bowel sounds this morning.   Lab Results: '@LABTEST2' @ Micro Results: No results found for this or any previous visit (from the past 240 hour(s)). Studies/Results: Dg Chest Port 1 View  Result Date: 10/24/2017 CLINICAL DATA:  84 year old male in the ICU status post cardiac arrest. Tracheostomy tube placed. Shortness of breath. EXAM: PORTABLE CHEST 1 VIEW COMPARISON:  Portable chest 10/23/2017 and earlier. FINDINGS: Portable AP upright view at 0402 hours. Stable tracheostomy tube. Enteric tube has been removed. A right side PICC line is now in place. Stable mild cardiomegaly. Other mediastinal contours are within normal limits. Regressed veiling opacity at both lung bases and improved basilar ventilation. No pneumothorax or pulmonary edema. Residual retrocardiac opacity. No pleural effusion is evident. IMPRESSION: 1. Enteric tube removed and right PICC line placed. 2. Improved bibasilar ventilation since yesterday. Residual retrocardiac atelectasis. Electronically Signed   By: Genevie Ann M.D.   On: 10/24/2017 07:09   Dg Chest Port 1 View  Result  Date: 10/23/2017 CLINICAL DATA:  Short of breath EXAM: PORTABLE CHEST 1 VIEW COMPARISON:  10/22/2017 FINDINGS: Tracheostomy in good position.  NG tube in the stomach. Bibasilar airspace disease unchanged. Airspace disease more severe on the left than the right. Small pleural effusions unchanged. IMPRESSION: Bibasilar atelectasis/infiltrate and bilateral effusions unchanged from the prior study. Electronically Signed   By: Franchot Gallo M.D.   On: 10/23/2017 07:12   [place] Ekg Site Rite  Result Date: 10/23/2017 If Site Rite image not attached, placement could not be confirmed due to current cardiac rhythm.  Medications: I have reviewed the patient's current medications. Scheduled Meds: . ALPRAZolam  1 mg Oral TID  . amLODipine  10 mg Per Tube Daily  . chlorhexidine gluconate (MEDLINE KIT)  15 mL Mouth Rinse BID  . famotidine  20 mg Per Tube BID  . feeding supplement (PRO-STAT SUGAR FREE 64)  60 mL Per Tube BID  . free water  120 mL Per Tube Q4H  . furosemide  20 mg Intravenous BID  . Influenza vac split quadrivalent PF  0.5 mL Intramuscular Tomorrow-1000  . insulin aspart  0-20 Units Subcutaneous Q4H  . mouth rinse  15 mL Mouth Rinse 10 times per day  . midazolam  2 mg Intravenous Once  . multivitamin  15 mL Per Tube Daily  . QUEtiapine  25 mg Oral QHS  . sodium chloride flush  10-40 mL Intracatheter Q12H   Continuous Infusions: . sodium chloride 0 mL/hr at 10/19/17 1356  .  dexmedetomidine (PRECEDEX) IV infusion 1.2 mcg/kg/hr (10/24/17 0905)  . dextrose 5 % and 0.45% NaCl Stopped (10/23/17 1819)  . feeding supplement (VITAL 1.5 CAL)    . fentaNYL infusion INTRAVENOUS 100 mcg/hr (10/24/17 0353)  . valproate sodium 500 mg (10/24/17 0221)   PRN Meds:.sodium chloride, acetaminophen **OR** acetaminophen, atropine, bisacodyl, fentaNYL, hydrALAZINE, midazolam, ondansetron **OR** ondansetron (ZOFRAN) IV, sodium chloride flush, traZODone   Assessment: Active Problems:   Cardiac arrest  ([place])   Dysphagia    Plan: The patient has positive bowel sounds and tube feedings can be started.  The PEG site should be cleaned and draped daily. I will sign off.  Please call if any further GI concerns or questions.  We would like to thank you for the opportunity to participate in the care of Billy Liu.     LOS: 17 days   Brighton Pilley 10/24/2017, 10:25 AM

## 2017-10-24 NOTE — Progress Notes (Signed)
SUBJECTIVE: Pt underwent tracheostomy and is dependent on ventilator. Very agitated, requiring ativan. Blood pressure are labile.      Vitals:   10/24/17 0500 10/24/17 0600 10/24/17 0700 10/24/17 0832  BP: (!) 196/158 (!) 152/67 (!) 152/68   Pulse: 67 (!) 54 (!) 49   Resp: 17 13 13    Temp:      TempSrc:      SpO2: 100% 96% 100% 100%  Weight:      Height:        Intake/Output Summary (Last 24 hours) at 10/24/2017 0838 Last data filed at 10/24/2017 4098 Gross per 24 hour  Intake 1011.14 ml  Output 3400 ml  Net -2388.86 ml    LABS: Basic Metabolic Panel: Recent Labs    10/22/17 0334 10/23/17 1034  NA 140 141  K 3.4* 3.7  CL 103 103  CO2 31 30  GLUCOSE 191* 125*  BUN 38* 39*  CREATININE 0.74 0.70  CALCIUM 9.1 9.4  MG 2.1 1.8  PHOS 3.2 3.5   Liver Function Tests: Recent Labs    10/22/17 0334 10/23/17 1034  AST 28 27  ALT 20 19  ALKPHOS 88 86  BILITOT 0.2* 0.3  PROT 6.8 6.8  ALBUMIN 2.6* 2.7*   No results for input(s): LIPASE, AMYLASE in the last 72 hours. CBC: Recent Labs    10/22/17 0334 10/23/17 1034  WBC 6.7 7.5  NEUTROABS 4.5 5.7  HGB 8.8* 8.6*  HCT 29.7* 28.6*  MCV 84.4 83.6  PLT 398 378   Cardiac Enzymes: No results for input(s): CKTOTAL, CKMB, CKMBINDEX, TROPONINI in the last 72 hours. BNP: Invalid input(s): POCBNP D-Dimer: No results for input(s): DDIMER in the last 72 hours. Hemoglobin A1C: No results for input(s): HGBA1C in the last 72 hours. Fasting Lipid Panel: No results for input(s): CHOL, HDL, LDLCALC, TRIG, CHOLHDL, LDLDIRECT in the last 72 hours. Thyroid Function Tests: Recent Labs    10/22/17 0334  TSH 2.997   Anemia Panel: No results for input(s): VITAMINB12, FOLATE, FERRITIN, TIBC, IRON, RETICCTPCT in the last 72 hours.   PHYSICAL EXAM General: Critically ill elderly man, agitated and restrained, restless HEENT:  Normocephalic and atramatic Neck:  No JVD.  Lungs: Clear bilaterally to auscultation and  percussion. Heart: HRRR . Normal S1 and S2 without gallops or murmurs.  Abdomen: Distended firm Extremities: 2+ LE edema   Neuro: Alert but not oriented.  TELEMETRY: NSR 62bpm  ASSESSMENT AND PLAN: S/P cardiac arrest, recent tracheostomy ventilator dependent with labile blood pressure but currently controlled. Continue Norvasc, IV lasix and PRN hydralazine.   Active Problems:   Cardiac arrest [facility])   Dysphagia    Caroleen Hamman, NP-C 10/24/2017 8:38 AM Cell: [phone]

## 2017-10-24 NOTE — Progress Notes (Signed)
Pt remained on ventilator overnight and IV gtts. Pt extremely agitated overnight and required two doses of ativan. See MAR. Pt remains with foley and rectal tube in place.

## 2017-10-25 DIAGNOSIS — G931 Anoxic brain damage, not elsewhere classified: Secondary | ICD-10-CM

## 2017-10-25 LAB — CBC WITH DIFFERENTIAL/PLATELET
ABS IMMATURE GRANULOCYTES: 0.05 10*3/uL (ref 0.00–0.07)
BASOS ABS: 0.1 10*3/uL (ref 0.0–0.1)
Basophils Relative: 1 %
Eosinophils Absolute: 0.3 10*3/uL (ref 0.0–0.5)
Eosinophils Relative: 4 %
HEMATOCRIT: 29.3 % — AB (ref 39.0–52.0)
HEMOGLOBIN: 8.8 g/dL — AB (ref 13.0–17.0)
IMMATURE GRANULOCYTES: 1 %
LYMPHS ABS: 0.8 10*3/uL (ref 0.7–4.0)
LYMPHS PCT: 8 %
MCH: 24.9 pg — ABNORMAL LOW (ref 26.0–34.0)
MCHC: 30 g/dL (ref 30.0–36.0)
MCV: 83 fL (ref 80.0–100.0)
Monocytes Absolute: 0.7 10*3/uL (ref 0.1–1.0)
Monocytes Relative: 7 %
NEUTROS PCT: 79 %
NRBC: 0 % (ref 0.0–0.2)
Neutro Abs: 7.8 10*3/uL — ABNORMAL HIGH (ref 1.7–7.7)
Platelets: 447 10*3/uL — ABNORMAL HIGH (ref 150–400)
RBC: 3.53 MIL/uL — ABNORMAL LOW (ref 4.22–5.81)
RDW: 17.5 % — ABNORMAL HIGH (ref 11.5–15.5)
WBC: 9.7 10*3/uL (ref 4.0–10.5)

## 2017-10-25 LAB — COMPREHENSIVE METABOLIC PANEL
ALK PHOS: 113 U/L (ref 38–126)
ALT: 19 U/L (ref 0–44)
AST: 24 U/L (ref 15–41)
Albumin: 2.7 g/dL — ABNORMAL LOW (ref 3.5–5.0)
Anion gap: 9 (ref 5–15)
BILIRUBIN TOTAL: 0.4 mg/dL (ref 0.3–1.2)
BUN: 28 mg/dL — AB (ref 8–23)
CHLORIDE: 103 mmol/L (ref 98–111)
CO2: 29 mmol/L (ref 22–32)
CREATININE: 0.68 mg/dL (ref 0.61–1.24)
Calcium: 9.1 mg/dL (ref 8.9–10.3)
GFR calc Af Amer: >60 mL/min (ref >60)
GFR calc non Af Amer: >60 mL/min (ref >60)
GLUCOSE: 172 mg/dL — AB (ref 70–99)
Potassium: 3.8 mmol/L (ref 3.5–5.1)
Sodium: 141 mmol/L (ref 135–145)
Total Protein: 7.3 g/dL (ref 6.5–8.1)

## 2017-10-25 LAB — GLUCOSE, CAPILLARY
GLUCOSE-CAPILLARY: 147 mg/dL — AB (ref 70–99)
GLUCOSE-CAPILLARY: 105 mg/dL — AB (ref 70–99)
Glucose-Capillary: 133 mg/dL — ABNORMAL HIGH (ref 70–99)
Glucose-Capillary: 139 mg/dL — ABNORMAL HIGH (ref 70–99)
Glucose-Capillary: 143 mg/dL — ABNORMAL HIGH (ref 70–99)
Glucose-Capillary: 149 mg/dL — ABNORMAL HIGH (ref 70–99)
Glucose-Capillary: 167 mg/dL — ABNORMAL HIGH (ref 70–99)
Glucose-Capillary: 68 mg/dL — ABNORMAL LOW (ref 70–99)
Glucose-Capillary: 98 mg/dL (ref 70–99)

## 2017-10-25 LAB — PHOSPHORUS: Phosphorus: 3.5 mg/dL (ref 2.5–4.6)

## 2017-10-25 LAB — MAGNESIUM: Magnesium: 2 mg/dL (ref 1.7–2.4)

## 2017-10-25 MED ORDER — PROPOFOL 1000 MG/100ML IV EMUL
5.0000 ug/kg/min | INTRAVENOUS | Status: DC
  Administered 2017-10-25: 20 ug/kg/min via INTRAVENOUS
  Administered 2017-10-25: 60 ug/kg/min via INTRAVENOUS
  Administered 2017-10-25: 30 ug/kg/min via INTRAVENOUS
  Administered 2017-10-26: 80 ug/kg/min via INTRAVENOUS
  Administered 2017-10-26: 60 ug/kg/min via INTRAVENOUS
  Filled 2017-10-25 (×5): qty 100

## 2017-10-25 NOTE — Progress Notes (Signed)
   10/25/17 1355  Clinical Encounter Type  Visited With Patient;Health care provider  Visit Type Initial;Spiritual support;Critical Care  Referral From Nurse  Consult/Referral To Chaplain  Spiritual Encounters  Spiritual Needs Prayer;Emotional   While rounding on the ICU, I noticed the RN for Billy Liu providing care for him. He was very restless and this made it difficult for the RN to hold him to stay in his bed and provide medication. I entered the room and spoke with Billy Liu, who is unable to speak but did respond to my voice and this allowed the RN the opportunity to give a shot to the patient to help him relax. Billy Liu at times seems to understand on some level but not continuously. I will follow up if needed.

## 2017-10-25 NOTE — Progress Notes (Signed)
CRITICAL CARE NOTE  CC  follow up respiratory failure/vent dependent/self extubated x 2   BRIEF PATIENT DESCRIPTION:  84 yo male admitted with acute on chronic renal failure post cardiac arrest mechanically intubated hypothermic protocol initiated _0  degrees C  SIGNIFICANT EVENTS/STUDIES:  09/30-Pt admitted to ICU post cardiac arrest mechanically intubated hypothermic protocol initiated by ER provider 09/30-CT Head and Cervical Spine revealed no acute intracranial abnormality. No skull fracture. Unchanged atrophy, chronic small vessel ischemia, and left parietal encephalomalacia. Advanced multilevel degenerative change in the cervical spine without evidence of acute fracture. Soft tissue prominence involving the right hypopharynx is only partially included in the field of view, this may represent retained secretions or asymmetric positioning of the patient's tongue, however underlying mass lesion is not excluded. Consider contrast enhanced CT of the neck as clinically indicated. Moderate right pleural effusion partially included in the field of view. 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  No major overnight events noted.  Occasional agitation and restlessness during the day requiring initiation of low-dose propofol for comfort. Off antibiotics now  No fever  Currently on full support plan is to taper the  sedation and able to try to wean the patient -ABG was reviewed and essentially okay   BP (!) 121/50   Pulse (!) 54   Temp 97.9 F (36.6 C) (Oral)   Resp 15   Ht _1  (1.905 m)   Wt 117 kg   SpO2 100%   BMI 32.24 kg/m    REVIEW OF SYSTEMS  PATIENT IS UNABLE TO PROVIDE COMPLETE REVIEW OF SYSTEMS DUE TO SEVERE CRITICAL ILLNESS   PHYSICAL EXAMINATION:  GENERAL:critically ill appearing, HEAD: Normocephalic, atraumatic.  EYES: Pupils equal, round, reactive to light.  No scleral icterus.  MOUTH: Moist mucosal membrane. NECK: Supple. No thyromegaly. S/P trach PULMONARY:CTA with decreases breath sounds at bases without rhonci or wheezing CARDIOVASCULAR: S1 and S2. Regular rate and rhythm. No murmurs, rubs, or gallops.  GASTROINTESTINAL: Soft, nontender, but tense . No masses. Positive bowel sounds. No hepatosplenomegaly.  MUSCULOSKELETAL: No swelling, clubbing, or edema.  NEUROLOGIC: sedated heavily SKIN: Left toe dressing and right foot dressing noted   INTAKE/OUTPUT  Intake/Output Summary (Last 24 hours) at 10/25/2017 2152 Last data filed at 10/25/2017 0600 Gross per 24 hour  Intake 771.14 ml  Output 500 ml  Net 271.14 ml    LABS  CBC Recent Labs  Lab 10/23/17 1034 10/24/17 0832 10/25/17 0434  WBC 7.5 7.9 9.7  HGB 8.6* 9.2* 8.8*  HCT 28.6* 30.4* 29.3*  PLT 378 438* 447*   Coag's Recent Labs  Lab 10/22/17 0334  INR 1.08   BMET Recent Labs  Lab 10/23/17 1034 10/24/17 0832 10/25/17 0434  NA 141 139 141  K 3.7 4.0 3.8  CL 103 101 103  CO2 _2 BUN 39* 30* 28*  CREATININE 0.70 0.87 0.68  GLUCOSE 125* 193* 172*   Electrolytes Recent Labs  Lab 10/22/17 0334 10/23/17 1034 10/24/17 0832 10/25/17 0434  CALCIUM 9.1 9.4 9.3  9.1  MG 2.1 1.8 2.1 2.0  PHOS 3.2 3.5  --  3.5   Sepsis Markers No results for input(s): LATICACIDVEN, PROCALCITON, O2SATVEN in the last 168 hours. ABG Recent Labs  Lab 10/22/17 0328 10/23/17 0435 10/24/17 0439   PHART 7.49* 7.49* 7.48*  PCO2ART 44 46 39  PO2ART 97 66* 72*   Liver Enzymes Recent Labs  Lab 10/23/17 1034 10/24/17 0832 10/25/17 0434  AST _0 ALT _1 ALKPHOS 86 94 113  BILITOT 0.3 0.4 0.4  ALBUMIN 2.7* 3.0* 2.7*   Cardiac Enzymes No results for input(s): TROPONINI, PROBNP in the last 168 hours. Glucose Recent Labs  Lab 10/25/17 0327 10/25/17 0722 10/25/17 1132 10/25/17 1551 10/25/17 1941 10/25/17 2104  GLUCAP 147* 149* 167* 133* 68* 98     No results found for this or any previous visit (from the past 240 hour(s)).  MEDICATIONS   Current Facility-Administered Medications:  .  0.9 %  sodium chloride infusion, , Intravenous, PRN, Tukov-Yual, Magdalene S, NP, Last Rate: 10 mL/hr at 10/25/17 0600 .  acetaminophen (TYLENOL) tablet 650 mg, 650 mg, Oral, Q6H PRN **OR** acetaminophen (TYLENOL) suppository 650 mg, 650 mg, Rectal, Q6H PRN, Amelia Jo, MD .  ALPRAZolam Duanne Moron) tablet 1 mg, 1 mg, Oral, TID, Darel Hong D, NP, 1 mg at 10/25/17 1654 .  amLODipine (NORVASC) tablet 10 mg, 10 mg, Per Tube, Daily, Epifanio Lesches, MD, 10 mg at 10/25/17 1034 .  atropine 1 MG/10ML injection 1 mg, 1 mg, Intravenous, PRN, Awilda Bill, NP, 1 mg at 10/07/17 1824 .  bisacodyl (DULCOLAX) EC tablet 5 mg, 5 mg, Oral, Daily PRN, Amelia Jo, MD .  chlorhexidine gluconate (MEDLINE KIT) (PERIDEX) 0.12 % solution 15 mL, 15 mL, Mouth Rinse, BID, Blakeney, Dana G, NP, 15 mL at 10/25/17 1950 .  dexmedetomidine (PRECEDEX) 400 MCG/100ML (4 mcg/mL) infusion, 0.4-1.2 mcg/kg/hr, Intravenous, Titrated, Lahoma Rocker, MD, Last Rate: 30.3 mL/hr at 10/25/17 1610, 1 mcg/kg/hr at 10/25/17 1610 .  dextrose 5 %-0.45 % sodium chloride infusion, , Intravenous, Continuous, Lahoma Rocker, MD, Stopped at 10/24/17 1119 .  famotidine (PEPCID) tablet 20 mg, 20 mg, Per Tube, BID, Charlett Nose, RPH, 20 mg at 10/25/17 1034 .  feeding supplement (PRO-STAT SUGAR FREE 64) liquid 60 mL, 60 mL,  Per Tube, BID, Lahoma Rocker, MD, 60 mL at 10/25/17 1954 .  feeding supplement (VITAL 1.5 CAL) liquid 1,000 mL, 1,000 mL, Per Tube, Continuous, Lahoma Rocker, MD, Last Rate: 45 mL/hr at 10/25/17 1942, 1,000 mL at 10/25/17 1942 .  fentaNYL (SUBLIMAZE) bolus via infusion 25-100 mcg, 25-100 mcg, Intravenous, Q1H PRN, Awilda Bill, NP, 100 mcg at 10/24/17 0356 .  fentaNYL 2576mg in NS 2550m(1051mml) infusion-PREMIX, 25-100 mcg/hr, Intravenous, Continuous, ShaLahoma RockerD, Last Rate: 10 mL/hr at 10/25/17 2103, 100 mcg/hr at 10/25/17 2103 .  free water 120 mL, 120 mL, Per Tube, Q4H, ShaLahoma RockerD, 120 mL at 10/25/17 1952 .  furosemide (LASIX) 10 MG/ML solution 20 mg, 20 mg, Oral, BID, ShaManuella Ghaziutul, MD, 20 mg at 10/25/17 1834 .  heparin injection 5,000 Units, 5,000 Units, Subcutaneous, Q8H, ShaLahoma RockerD, 5,000 Units at 10/25/17 1421 .  hydrALAZINE (APRESOLINE) injection 10-20 mg, 10-20 mg, Intravenous, Q2H PRN, ShaLahoma RockerD, 20 mg at 10/25/17 0523 .  Influenza vac split quadrivalent PF (FLUZONE HIGH-DOSE) injection 0.5 mL, 0.5 mL, Intramuscular, Tomorrow-1000, Blakeney, Dana G, NP .  insulin aspart (novoLOG) injection 0-20 Units, 0-20 Units, Subcutaneous, Q4H, Tukov-Yual, Magdalene  S, NP, 3 Units at 10/25/17 1613 .  insulin detemir (LEVEMIR) injection 10 Units, 10 Units, Subcutaneous, BID, Lahoma Rocker, MD, 10 Units at 10/25/17 1036 .  lisinopril (PRINIVIL,ZESTRIL) tablet 20 mg, 20 mg, Oral, Daily, Lahoma Rocker, MD, 20 mg at 10/25/17 1034 .  MEDLINE mouth rinse, 15 mL, Mouth Rinse, 10 times per day, Awilda Bill, NP, 15 mL at 10/25/17 1834 .  midazolam (VERSED) injection 1 mg, 1 mg, Intravenous, Q2H PRN, Awilda Bill, NP, 1 mg at 10/25/17 1422 .  multivitamin liquid 15 mL, 15 mL, Per Tube, Daily, Kasa, Kurian, MD, 15 mL at 10/25/17 1034 .  OLANZapine (ZYPREXA) tablet 5 mg, 5 mg, Per Tube, Daily, Lahoma Rocker, MD, 5 mg at 10/25/17 1034 .  ondansetron (ZOFRAN) tablet 4 mg, 4 mg, Oral, Q6H PRN  **OR** ondansetron (ZOFRAN) injection 4 mg, 4 mg, Intravenous, Q6H PRN, Amelia Jo, MD .  propofol (DIPRIVAN) 1000 MG/100ML infusion, 5-80 mcg/kg/min, Intravenous, Titrated, Tyler Pita, MD, Last Rate: 21.1 mL/hr at 10/25/17 1940, 30 mcg/kg/min at 10/25/17 1940 .  QUEtiapine (SEROQUEL) tablet 25 mg, 25 mg, Per Tube, QHS, Epifanio Lesches, MD, 25 mg at 10/24/17 2221 .  sodium chloride flush (NS) 0.9 % injection 10-40 mL, 10-40 mL, Intracatheter, Q12H, Lahoma Rocker, MD, 10 mL at 10/24/17 2223 .  sodium chloride flush (NS) 0.9 % injection 10-40 mL, 10-40 mL, Intracatheter, PRN, Lahoma Rocker, MD .  traZODone (DESYREL) tablet 25 mg, 25 mg, Per Tube, QHS PRN, Bradly Bienenstock, NP, 25 mg at 10/22/17 2129 .  valproic acid (DEPAKENE) solution 500 mg, 500 mg, Per Tube, Q8H, Epifanio Lesches, MD, 500 mg at 10/25/17 1422      Indwelling Urinary Catheter continued, requirement due to   Reason to continue Indwelling Urinary Catheter for strict Intake/Output monitoring for hemodynamic instability   Central Line continued, requirement due to   Reason to continue [employer] Monitoring of central venous pressure or other hemodynamic parameters   Ventilator continued, requirement due to, resp failure    Ventilator Sedation RASS 0 to -2     ASSESSMENT AND PLAN SYNOPSIS Acute respiratory failure secondary to LLL pneumonia and pulmonary edema  Mechanical intubation s/p cardiac arrest status post PEG and trach on 10/20/2017 Hx: COPD and Obesity  Full vent support-vent Prn bronchodilator therapy- discussed with nursing at length about sedation vacation and tapering the sedation drips - Continue PRN Zyprexa and propofol for comfort and sedation - DC Precedex  -Continue VAP bundle  Discussed with nursing regarding sedation vacation  Acute on chronic diastolic CHF Cardiac Arrest (cardiac rhythm unknown)-hypothermic protocol completed Bradycardia-resolved Hx: HTN, CAD, and  Hyperlipidemia  Continuous telemetry monitoring  Maintain map >65 -Continue IV diuresis and potassium supplementation -Trend renal indices -Continue current blood pressure regimen - Keep systolic blood pressure less than 150 limitus mercury  Acute on chronic renal failure with hyperkalemia-resolved Metabolic acidosis-resolved  Trend BMP-- Replace K Replace electrolytes as indicated  Monitor UOP  Avoid nephrotoxic medications   LLL pneumonia  Diabetic Ulcers Bilateral Lower Extremities Trend WBC and monitor fever curve  Pleated meropenum/vanco Monitor if further worsening of sepsis will rpt culture and start antibiotics again Podiatry consult for foot-suggested wound looks okay and no further acute intervention Xray foot-showed soft tissue swelling no evidence of osteomyelitis no further recommendation except wound dressing per podiatry  Anemia without obvious acute blood loss  Trend CBC  Monitor for s/sx of bleeding  Transfuse for hgb <7 VTE PV:VZSM heparin resume tonight  Nutrition Constipation  SUP px: pepcid Continue TF's per PEG placement continue bowel regimen   Hyperglycemiadoing better SSI  Acute encephalopathy Agitation/Delirium Hx: Dementia, Depression, and Anxiety Maintain RASS goal 0  Low-dose propofol for comfort and sedation   Skin/Wound: Foot dressing   Electrolytes: Replace electrolytes per ICU electrolyte replacement protocol.   IVF: none  Nutrition: Tube feeds as tolerated per PEG  Prophylaxis: DVT Prophylaxis with Heparin,. GI Prophylaxis.   Restraints: Soft limb to prevent self extubation  PT/OT eval and treat. OOB when appropriate.   Lines/Tubes: 17 days per urology foley PICC line placed on 10/23/2017  ADVANCE DIRECTIVE:Full code  FAMILY DISCUSSION:Palliative following   Quality Care: PPI, DVT prophylaxis, HOB elevated, Infection control all reviewed and addressed.  Events and notes from last 24 hours reviewed. Care  plan discussed on multidisciplinary rounds  CC TIME:34 min  -Case manager has [facility] will let us know and we will proceed with weaning and transfer   Old records reviewed discussed results and management plan with patient  Images personally reviewed and results and labs reviewed and discussed with patient.  All medication reviewed and adjusted  Further management depending on test results and work up as outlined above.   Magdalene S. [facility] ANP-BC Pulmonary and Critical Care Medicine [facility] Pager [phone] or [phone]  NB: This document was prepared using Dragon voice recognition software and may include unintentional dictation errors.   10/25/2017 9:52 PM [place] [facility]

## 2017-10-25 NOTE — Progress Notes (Signed)
SUBJECTIVE intubated   Vitals:   10/25/17 0415 10/25/17 0500 10/25/17 0530 10/25/17 0600  BP:  (!) 166/74  (!) 117/52  Pulse:  65 70 (!) 56  Resp:  17 19 15   Temp: 97.8 F (36.6 C)     TempSrc: Oral     SpO2:  100% 100% 100%  Weight:      Height:        Intake/Output Summary (Last 24 hours) at 10/25/2017 0942 Last data filed at 10/25/2017 0600 Gross per 24 hour  Intake 2693.99 ml  Output 2000 ml  Net 693.99 ml    LABS: Basic Metabolic Panel: Recent Labs    10/23/17 1034 10/24/17 0832 10/25/17 0434  NA 141 139 141  K 3.7 4.0 3.8  CL 103 101 103  CO2 30 30 29   GLUCOSE 125* 193* 172*  BUN 39* 30* 28*  CREATININE 0.70 0.87 0.68  CALCIUM 9.4 9.3 9.1  MG 1.8 2.1 2.0  PHOS 3.5  --  3.5   Liver Function Tests: Recent Labs    10/24/17 0832 10/25/17 0434  AST 24 24  ALT 20 19  ALKPHOS 94 113  BILITOT 0.4 0.4  PROT 7.9 7.3  ALBUMIN 3.0* 2.7*   No results for input(s): LIPASE, AMYLASE in the last 72 hours. CBC: Recent Labs    10/24/17 0832 10/25/17 0434  WBC 7.9 9.7  NEUTROABS 5.8 7.8*  HGB 9.2* 8.8*  HCT 30.4* 29.3*  MCV 83.1 83.0  PLT 438* 447*   Cardiac Enzymes: No results for input(s): CKTOTAL, CKMB, CKMBINDEX, TROPONINI in the last 72 hours. BNP: Invalid input(s): POCBNP D-Dimer: No results for input(s): DDIMER in the last 72 hours. Hemoglobin A1C: No results for input(s): HGBA1C in the last 72 hours. Fasting Lipid Panel: No results for input(s): CHOL, HDL, LDLCALC, TRIG, CHOLHDL, LDLDIRECT in the last 72 hours. Thyroid Function Tests: No results for input(s): TSH, T4TOTAL, T3FREE, THYROIDAB in the last 72 hours.  Invalid input(s): FREET3 Anemia Panel: No results for input(s): VITAMINB12, FOLATE, FERRITIN, TIBC, IRON, RETICCTPCT in the last 72 hours.   PHYSICAL EXAM General: Well developed, well nourished, in no acute distress HEENT:  Normocephalic and atramatic Neck:  No JVD.  Lungs: Clear bilaterally to auscultation and  percussion. Heart: HRRR . Normal S1 and S2 without gallops or murmurs.  Abdomen: Bowel sounds are positive, abdomen soft and non-tender  Msk:  Back normal, normal gait. Normal strength and tone for age. Extremities: No clubbing, cyanosis or edema.   Neuro: Alert and oriented X 3. Psych:  Good affect, responds appropriately  TELEMETRY: NSR  ASSESSMENT AND PLAN: Remains intubated and unresponsive and on ventilator and depended on it.  Active Problems:   Cardiac arrest (HCC)   Dysphagia    Laurier Nancy, MD, [facility] 10/25/2017 9:42 AM

## 2017-10-25 NOTE — Progress Notes (Signed)
Peg tube residual 90 ml. Residual returned to patient. Tube feed restarted at 75ml/hr. See new order entered by NP

## 2017-10-25 NOTE — Consult Note (Signed)
Pharmacy Electrolyte Monitoring Consult:  Pharmacy consulted to assist in monitoring and replacing electrolytes in this 84 y.o. male admitted on 10/06/2017 with Cardiac Arrest   Labs:  Sodium (mmol/L)  Date Value  10/25/2017 141  07/04/2013 133 (L)   Potassium (mmol/L)  Date Value  10/25/2017 3.8  07/04/2013 4.3   Magnesium (mg/dL)  Date Value  16/10/9602 2.0   Phosphorus (mg/dL)  Date Value  54/09/8117 3.5   Calcium (mg/dL)  Date Value  14/78/2956 9.1   Calcium, Total (mg/dL)  Date Value  21/30/8657 8.7   Albumin (g/dL)  Date Value  84/69/6295 2.7 (L)  07/04/2013 3.6    Assessment/Plan: 1. Electrolytes: Patient ordered furosemide 20mg  IV BID. Electrolytes are WNL today. No supplementation required. Will check with AM labs.   2. Glucose mangagment: BG 147-176 within last 24 hours. Continue SSI q4h. PEG placed yesterday. Tube feeds and Levemir are to resume today.   3. Constipation Management: Flexiseal in place.   Pharmacy will continue to monitor and adjust per consult.   Stormy Card, [facility] 10/25/2017 11:29 AM

## 2017-10-25 NOTE — Progress Notes (Signed)
Notified NP that triglycerides ordered today at 1400 were never drawn. Acknowledged. OK to draw with 10/26/2017 0500 AM labs.

## 2017-10-25 NOTE — Progress Notes (Signed)
Patient ID: Billy Liu, male   DOB: Apr 16, 1932, 84 y.o.   MRN: [id number]  [facility] PROGRESS NOTE  Billy Liu [id number]:[id number] DOB: Sep 06, 1932 DOA: 10/06/2017 PCP: Billy Morin, MD  HPI/Subjective:  Patient is status post trach and PEG.  Unresponsive, on ventilation. Objective: Vitals:   10/25/17 0530 10/25/17 0600  BP:  (!) 117/52  Pulse: 70 (!) 56  Resp: 19 15  Temp:    SpO2: 100% 100%    Filed Weights   10/22/17 0452 10/23/17 0500 10/24/17 0423  Weight: 119.6 kg 119.6 kg 117 kg    ROS: Review of Systems  Unable to perform ROS: Acuity of condition   Exam: Physical Exam  HENT:  Nose: No mucosal edema.  Eyes: Conjunctivae and lids are normal.  Neck: Carotid bruit is not present.  Cardiovascular: Regular rhythm, S1 normal, S2 normal and normal heart sounds.  Respiratory: He has decreased breath sounds. He has no wheezes. He has rhonchi.  GI: Bowel sounds are normal. He exhibits no distension. There is no guarding.  Musculoskeletal:       Right ankle: He exhibits swelling.       Left ankle: He exhibits swelling.  Neurological:  Unable to exam.  Skin: Skin is warm.      Data Reviewed: Basic Metabolic Panel: Recent Labs  Lab 10/21/17 0427 10/22/17 0334 10/23/17 1034 10/24/17 0832 10/25/17 0434  NA 141 140 141 139 141  K 3.5 3.4* 3.7 4.0 3.8  CL 101 103 103 101 103  CO2 '31 31 30 30 29  ' GLUCOSE 133* 191* 125* 193* 172*  BUN 44* 38* 39* 30* 28*  CREATININE 0.77 0.74 0.70 0.87 0.68  CALCIUM 8.7* 9.1 9.4 9.3 9.1  MG 2.1 2.1 1.8 2.1 2.0  PHOS  --  3.2 3.5  --  3.5   Liver Function Tests: Recent Labs  Lab 10/22/17 0334 10/23/17 1034 10/24/17 0832 10/25/17 0434  AST '28 27 24 24  ' ALT '20 19 20 19  ' ALKPHOS 88 86 94 113  BILITOT 0.2* 0.3 0.4 0.4  PROT 6.8 6.8 7.9 7.3  ALBUMIN 2.6* 2.7* 3.0* 2.7*   CBC: Recent Labs  Lab 10/21/17 0427 10/22/17 0334 10/23/17 1034 10/24/17 0832 10/25/17 0434  WBC 5.7 6.7 7.5 7.9 9.7   NEUTROABS 3.5 4.5 5.7 5.8 7.8*  HGB 9.1* 8.8* 8.6* 9.2* 8.8*  HCT 30.8* 29.7* 28.6* 30.4* 29.3*  MCV 86.8 84.4 83.6 83.1 83.0  PLT 368 398 378 438* 447*   Cardiac Enzymes: No results for input(s): CKTOTAL, CKMB, CKMBINDEX, TROPONINI in the last 168 hours.  CBG: Recent Labs  Lab 10/24/17 2346 10/25/17 0327 10/25/17 0722 10/25/17 1132 10/25/17 1551  GLUCAP 143* 147* 149* 167* 133*    No results found for this or any previous visit (from the past 240 hour(s)).   Studies: Dg Abd 1 View  Result Date: 10/24/2017 CLINICAL DATA:  Abdominal distension. EXAM: ABDOMEN - 1 VIEW COMPARISON:  Radiograph of October 13, 2017. FINDINGS: The bowel gas pattern is normal. Gastrostomy tube is seen projected over gastric air bubble. No radio-opaque calculi or other significant radiographic abnormality are seen. IMPRESSION: Gastrostomy tube in grossly good position. No evidence of bowel obstruction or ileus. Electronically Signed   By: Marijo Conception, M.D.   On: 10/24/2017 21:22   Dg Chest Port 1 View  Result Date: 10/24/2017 CLINICAL DATA:  84 year old male in the ICU status post cardiac arrest. Tracheostomy tube placed. Shortness of breath. EXAM: PORTABLE CHEST 1 VIEW COMPARISON:  Portable chest 10/23/2017 and earlier. FINDINGS: Portable AP upright view at 0402 hours. Stable tracheostomy tube. Enteric tube has been removed. A right side PICC line is now in place. Stable mild cardiomegaly. Other mediastinal contours are within normal limits. Regressed veiling opacity at both lung bases and improved basilar ventilation. No pneumothorax or pulmonary edema. Residual retrocardiac opacity. No pleural effusion is evident. IMPRESSION: 1. Enteric tube removed and right PICC line placed. 2. Improved bibasilar ventilation since yesterday. Residual retrocardiac atelectasis. Electronically Signed   By: Genevie Ann M.D.   On: 10/24/2017 07:09    Scheduled Meds: . ALPRAZolam  1 mg Oral TID  . amLODipine  10 mg Per  Tube Daily  . chlorhexidine gluconate (MEDLINE KIT)  15 mL Mouth Rinse BID  . famotidine  20 mg Per Tube BID  . feeding supplement (PRO-STAT SUGAR FREE 64)  60 mL Per Tube BID  . free water  120 mL Per Tube Q4H  . furosemide  20 mg Oral BID  . heparin injection (subcutaneous)  5,000 Units Subcutaneous Q8H  . Influenza vac split quadrivalent PF  0.5 mL Intramuscular Tomorrow-1000  . insulin aspart  0-20 Units Subcutaneous Q4H  . insulin detemir  10 Units Subcutaneous BID  . lisinopril  20 mg Oral Daily  . mouth rinse  15 mL Mouth Rinse 10 times per day  . multivitamin  15 mL Per Tube Daily  . OLANZapine  5 mg Per Tube Daily  . QUEtiapine  25 mg Per Tube QHS  . sodium chloride flush  10-40 mL Intracatheter Q12H  . valproic acid  500 mg Per Tube Q8H   Continuous Infusions: . sodium chloride 10 mL/hr at 10/25/17 0600  . dexmedetomidine (PRECEDEX) IV infusion 1 mcg/kg/hr (10/25/17 1610)  . dextrose 5 % and 0.45% NaCl Stopped (10/24/17 1119)  . feeding supplement (VITAL 1.5 CAL) 45 mL/hr at 10/25/17 0600  . fentaNYL infusion INTRAVENOUS 100 mcg/hr (10/25/17 1123)  . propofol (DIPRIVAN) infusion 20 mcg/kg/min (10/25/17 1421)    Assessment/Plan:  1. Cardiac arrest.  Patient underwent hypothermia protocol.  Continue supportive care.  MRI shows old stroke neurology saw the patient, supportive care, patient seen by neurology, had EEG, showed background slowing.,  Family want trach and PEG, patient had trach and PEG. Possible LTAC placement. 2. ventilator dependent respiratory failure,   continue trach care, bronchodilators. 3. LLL pneumonia. Finished meropenum/vanco. 4. right lower extremity ulceration with foul-smelling discharge, left first toe ulceration and swelling of the first toe likely chronic infection.  Finished IV vancomycin, meropenem.  Wound cultures are positive for MRSA. 5. Acute kidney injury on CKD stage 3 and hyperkalemia.   This has improved. 6. Acute on chronic diastolic  congestive heart failure, seen by cardiology.   Lasix IV twice daily. 7. Overall prognosis poor and high risk for cardiopulmonary arrest.   8. Anemia.  Responded to transfusion.  Hemoglobin is stable. #8 diabetes mellitus type 2: Continue Levemir 10 units twice daily 9,History of dementia, patient is on Depakote Episodes of anxiety, patient has been on Xanax, Seroquel, has mittens on to help with treatments.  10. acute encephalopathy, agitation, delirium, dementia, depression, anxiety: Patient is on fentanyl, Versed. [name] #11 hypernatremia again, continue free water.  Follow-up sodium level. High risk for cardiac arrest.  Condition critical .family wants full code.  Status post trach, PEG. disposition possible transfer to LTAC with trach and PEG Code Status:     Code Status Orders  (From admission, onward)  Start     Ordered   10/07/17 0132  Full code  Continuous     10/07/17 0131        Code Status History    Date Active Date Inactive Code Status Order ID Comments User Context   10/07/2017 0031 10/07/2017 0131 Full Code [id number]  Awilda Bill, NP ED   06/17/2017 1856 06/23/2017 2244 Full Code [id number]  Nicholes Mango, MD Inpatient   02/18/2017 1623 02/18/2017 2133 Full Code [id number]  Delana Meyer, Dolores Lory, MD Inpatient   02/11/2017 1138 02/11/2017 1736 Full Code [id number]  Delana Meyer, Dolores Lory, MD Inpatient   10/24/2014 1934 10/31/2014 1837 Full Code [id number]  Fritzi Mandes, MD Inpatient   10/11/2014 2347 10/17/2014 1844 Full Code [id number]  Lance Coon, MD Inpatient     Family Communication: As per critical care team Disposition Plan: To be determined  Consultants:  Critical care specialist  Antibiotics:  Meropenem  Time spent: 36 minutes [place]

## 2017-10-25 NOTE — Progress Notes (Signed)
Patient continues to need BL wrist restraints to keep patient and staff safe. Pt continues to try and pull tubes/lines and swings his arms/fists at staff. Patient continues to have precedex and Fentanyl infusing to assist with comfort and sedation. Patient has refused all oral care this shift. Foley continues to drain dark yellow urine. Rectal tube drainin dark brown stool. Dressing to RLE dry and intact; dressing to left great toe, dry and intact. BP elevated this shift, PRN hydralazine IV 20 mg given with improvement noted each time. Tube feed held per NP order last night until KUB resulted. After KUB resulted new order entered to restart tube feed if < residual. Patient found with 90ml of residual and returned to patient. Tube feed restarted per NP order at 20ml and increased to goal rate by the end of shift. Pt has been SR on the monitor.

## 2017-10-26 LAB — GLUCOSE, CAPILLARY
GLUCOSE-CAPILLARY: 146 mg/dL — AB (ref 70–99)
GLUCOSE-CAPILLARY: 174 mg/dL — AB (ref 70–99)
GLUCOSE-CAPILLARY: 126 mg/dL — AB (ref 70–99)
Glucose-Capillary: 140 mg/dL — ABNORMAL HIGH (ref 70–99)

## 2017-10-26 LAB — TRIGLYCERIDES: TRIGLYCERIDES: 661 mg/dL — AB (ref <150)

## 2017-10-26 MED ORDER — FENTANYL 12 MCG/HR TD PT72
100.0000 ug | MEDICATED_PATCH | TRANSDERMAL | Status: DC
  Filled 2017-10-26: qty 8

## 2017-10-26 MED ORDER — SODIUM CHLORIDE 0.9 % IV BOLUS
500.0000 mL | Freq: Once | INTRAVENOUS | Status: AC
  Administered 2017-10-26: 500 mL via INTRAVENOUS

## 2017-10-26 MED ORDER — QUETIAPINE FUMARATE 25 MG PO TABS
25.0000 mg | ORAL_TABLET | Freq: Two times a day (BID) | ORAL | Status: DC
  Administered 2017-10-26 – 2017-10-27 (×3): 25 mg
  Filled 2017-10-26 (×3): qty 1

## 2017-10-26 MED ORDER — FENTANYL 100 MCG/HR TD PT72
100.0000 ug | MEDICATED_PATCH | TRANSDERMAL | Status: DC
  Administered 2017-10-26: 100 ug via TRANSDERMAL
  Filled 2017-10-26: qty 1

## 2017-10-26 MED ORDER — CLONAZEPAM 1 MG PO TABS
1.0000 mg | ORAL_TABLET | Freq: Two times a day (BID) | ORAL | Status: DC
  Administered 2017-10-26 – 2017-10-27 (×3): 1 mg via ORAL
  Filled 2017-10-26 (×3): qty 1

## 2017-10-26 NOTE — Progress Notes (Signed)
Patient ID: Billy Liu, male   DOB: Sep 20, 1932, 84 y.o.   MRN: [id number]  [facility] PROGRESS NOTE  Jad Johansson [id number]:[id number] DOB: 02-Dec-1932 DOA: 10/06/2017 PCP: Alvester Morin, MD  HPI/Subjective:  Patient is status post trach and PEG.  Unresponsive, on ventilation. Objective: Vitals:   10/26/17 1500 10/26/17 1600  BP: (!) 109/96 (!) 131/55  Pulse: 95 92  Resp: (!) 21 20  Temp:  98.2 F (36.8 C)  SpO2: 99% 100%    Filed Weights   10/23/17 0500 10/24/17 0423 10/26/17 0436  Weight: 119.6 kg 117 kg 115.8 kg    ROS: Review of Systems  Unable to perform ROS: Acuity of condition   Exam: Physical Exam  HENT:  Nose: No mucosal edema.  Eyes: Conjunctivae and lids are normal.  Neck: Carotid bruit is not present.  Cardiovascular: Regular rhythm, S1 normal, S2 normal and normal heart sounds.  Respiratory: He has decreased breath sounds. He has no wheezes. He has no rhonchi.  GI: Bowel sounds are normal. He exhibits no distension. There is no guarding.  Musculoskeletal:       Right ankle: He exhibits swelling.       Left ankle: He exhibits swelling.  Neurological:  Unable to exam.  Skin: Skin is warm.      Data Reviewed: Basic Metabolic Panel: Recent Labs  Lab 10/21/17 0427 10/22/17 0334 10/23/17 1034 10/24/17 0832 10/25/17 0434  NA 141 140 141 139 141  K 3.5 3.4* 3.7 4.0 3.8  CL 101 103 103 101 103  CO2 _0 GLUCOSE 133* 191* 125* 193* 172*  BUN 44* 38* 39* 30* 28*  CREATININE 0.77 0.74 0.70 0.87 0.68  CALCIUM 8.7* 9.1 9.4 9.3 9.1  MG 2.1 2.1 1.8 2.1 2.0  PHOS  --  3.2 3.5  --  3.5   Liver Function Tests: Recent Labs  Lab 10/22/17 0334 10/23/17 1034 10/24/17 0832 10/25/17 0434  AST _1 ALT _2 ALKPHOS 88 86 94 113  BILITOT 0.2* 0.3 0.4 0.4  PROT 6.8 6.8 7.9 7.3  ALBUMIN 2.6* 2.7* 3.0* 2.7*   CBC: Recent Labs  Lab 10/21/17 0427 10/22/17 0334 10/23/17 1034 10/24/17 0832 10/25/17 0434  WBC  5.7 6.7 7.5 7.9 9.7  NEUTROABS 3.5 4.5 5.7 5.8 7.8*  HGB 9.1* 8.8* 8.6* 9.2* 8.8*  HCT 30.8* 29.7* 28.6* 30.4* 29.3*  MCV 86.8 84.4 83.6 83.1 83.0  PLT 368 398 378 438* 447*   Cardiac Enzymes: No results for input(s): CKTOTAL, CKMB, CKMBINDEX, TROPONINI in the last 168 hours.  CBG: Recent Labs  Lab 10/25/17 2217 10/25/17 2343 10/26/17 0342 10/26/17 0749 10/26/17 1543  GLUCAP 105* 139* 146* 140* 174*    No results found for this or any previous visit (from the past 240 hour(s)).   Studies: Dg Abd 1 View  Result Date: 10/24/2017 CLINICAL DATA:  Abdominal distension. EXAM: ABDOMEN - 1 VIEW COMPARISON:  Radiograph of October 13, 2017. FINDINGS: The bowel gas pattern is normal. Gastrostomy tube is seen projected over gastric air bubble. No radio-opaque calculi or other significant radiographic abnormality are seen. IMPRESSION: Gastrostomy tube in grossly good position. No evidence of bowel obstruction or ileus. Electronically Signed   By: Marijo Conception, M.D.   On: 10/24/2017 21:22    Scheduled Meds: . amLODipine  10 mg Per Tube Daily  . chlorhexidine gluconate (MEDLINE KIT)  15 mL Mouth Rinse BID  . clonazePAM  1 mg  Oral BID  . famotidine  20 mg Per Tube BID  . feeding supplement (PRO-STAT SUGAR FREE 64)  60 mL Per Tube BID  . fentaNYL  100 mcg Transdermal Q72H  . free water  120 mL Per Tube Q4H  . furosemide  20 mg Oral BID  . heparin injection (subcutaneous)  5,000 Units Subcutaneous Q8H  . Influenza vac split quadrivalent PF  0.5 mL Intramuscular Tomorrow-1000  . insulin aspart  0-20 Units Subcutaneous Q4H  . insulin detemir  10 Units Subcutaneous BID  . lisinopril  20 mg Oral Daily  . mouth rinse  15 mL Mouth Rinse 10 times per day  . multivitamin  15 mL Per Tube Daily  . QUEtiapine  25 mg Per Tube BID  . sodium chloride flush  10-40 mL Intracatheter Q12H  . valproic acid  500 mg Per Tube Q8H   Continuous Infusions: . sodium chloride Stopped (10/25/17 1359)  .  dexmedetomidine (PRECEDEX) IV infusion 0.3 mcg/kg/hr (10/26/17 1400)  . dextrose 5 % and 0.45% NaCl Stopped (10/24/17 1119)  . feeding supplement (VITAL 1.5 CAL) 45 mL/hr at 10/26/17 1600  . fentaNYL infusion INTRAVENOUS 100 mcg/hr (10/26/17 1400)  . propofol (DIPRIVAN) infusion Stopped (10/26/17 0930)  . sodium chloride 500 mL (10/26/17 1609)    Assessment/Plan:  1. Cardiac arrest.  Patient underwent hypothermia protocol.  Continue supportive care.  MRI shows old stroke neurology saw the patient, supportive care, patient seen by neurology, had EEG, showed background slowing.,  Family want trach and PEG, patient had trach and PEG. Possible LTAC placement. 2. ventilator dependent respiratory failure,   continue trach care, bronchodilators. 3. LLL pneumonia. Finished meropenum/vanco. 4. right lower extremity ulceration with foul-smelling discharge, left first toe ulceration and swelling of the first toe likely chronic infection.  Finished IV vancomycin, meropenem.  Wound cultures are positive for MRSA. 5. Acute kidney injury on CKD stage 3 and hyperkalemia.   This has improved. 6. Acute on chronic diastolic congestive heart failure, seen by cardiology.  Continue Lasix IV twice daily. 7. Overall prognosis poor and high risk for cardiopulmonary arrest.   8. Anemia.  Responded to transfusion.  Hemoglobin is stable. #8 diabetes mellitus type 2: Continue Levemir 10 units twice daily 9,History of dementia, patient is on Depakote Episodes of anxiety, patient has been on Xanax, Seroquel, has mittens on to help with treatments.  10. acute encephalopathy, agitation, delirium, dementia, depression, anxiety: Patient is on fentanyl, Versed. [name] #11 hypernatremia, improved. High risk for cardiac arrest.  Condition critical .family wants full code.  Status post trach, PEG. disposition possible transfer to LTAC with trach and PEG Code Status:     Code Status Orders  (From admission, onward)          Start     Ordered   10/07/17 0132  Full code  Continuous     10/07/17 0131        Code Status History    Date Active Date Inactive Code Status Order ID Comments User Context   10/07/2017 0031 10/07/2017 0131 Full Code [id number]  Awilda Bill, NP ED   06/17/2017 1856 06/23/2017 2244 Full Code [id number]  Nicholes Mango, MD Inpatient   02/18/2017 1623 02/18/2017 2133 Full Code [id number]  Delana Meyer, Dolores Lory, MD Inpatient   02/11/2017 1138 02/11/2017 1736 Full Code [id number]  Schnier, Dolores Lory, MD Inpatient   10/24/2014 1934 10/31/2014 1837 Full Code [id number]  Fritzi Mandes, MD Inpatient   10/11/2014 2347 10/17/2014 1844 Full Code [id number]  Lance Coon, MD Inpatient     Family Communication: As per critical care team Disposition Plan: To be determined  Consultants:  Critical care specialist  Antibiotics:  Meropenem  Time spent: 26 minutes [place]

## 2017-10-26 NOTE — Anesthesia Postprocedure Evaluation (Signed)
Anesthesia Post Note  Patient: Billy Liu  Procedure(s) Performed: PERCUTANEOUS ENDOSCOPIC GASTROSTOMY (PEG) PLACEMENT (N/A )  Patient location during evaluation: ICU Anesthesia Type: General Level of consciousness: sedated and obtunded/minimal responses Vital Signs Assessment: post-procedure vital signs reviewed and stable Respiratory status: patient on ventilator - see flowsheet for VS and patient remains intubated per anesthesia plan Cardiovascular status: blood pressure returned to baseline and stable Postop Assessment: no apparent nausea or vomiting Anesthetic complications: no     Last Vitals:  Vitals:   10/26/17 0600 10/26/17 0630  BP: (!) 152/60 (!) 155/62  Pulse: (!) 114 (!) 127  Resp: (!) 27 (!) 28  Temp:    SpO2: 98% 97%    Last Pain:  Vitals:   10/26/17 0342  TempSrc: Oral  PainSc:                  Jovita Gamma

## 2017-10-27 ENCOUNTER — Other Ambulatory Visit (HOSPITAL_COMMUNITY): Payer: Medicare Other

## 2017-10-27 ENCOUNTER — Inpatient Hospital Stay: Payer: Medicare Other

## 2017-10-27 ENCOUNTER — Inpatient Hospital Stay
Admission: RE | Admit: 2017-10-27 | Discharge: 2017-12-01 | Disposition: A | Discharge status: Skilled Nursing Facility | Payer: Medicare Other | Source: Other Acute Inpatient Hospital | Attending: Internal Medicine | Admitting: Internal Medicine

## 2017-10-27 ENCOUNTER — Ambulatory Visit (HOSPITAL_COMMUNITY)
Admission: AD | Admit: 2017-10-27 | Discharge: 2017-10-27 | Disposition: A | Discharge status: Home / Self Care | Payer: Medicare Other | Source: Other Acute Inpatient Hospital | Attending: Internal Medicine | Admitting: Internal Medicine

## 2017-10-27 DIAGNOSIS — R4189 Other symptoms and signs involving cognitive functions and awareness: Secondary | ICD-10-CM

## 2017-10-27 DIAGNOSIS — F0390 Unspecified dementia without behavioral disturbance: Secondary | ICD-10-CM

## 2017-10-27 DIAGNOSIS — R140 Abdominal distension (gaseous): Secondary | ICD-10-CM

## 2017-10-27 DIAGNOSIS — Z9911 Dependence on respirator [ventilator] status: Secondary | ICD-10-CM | POA: Insufficient documentation

## 2017-10-27 DIAGNOSIS — I509 Heart failure, unspecified: Secondary | ICD-10-CM

## 2017-10-27 DIAGNOSIS — J9621 Acute and chronic respiratory failure with hypoxia: Secondary | ICD-10-CM

## 2017-10-27 DIAGNOSIS — J449 Chronic obstructive pulmonary disease, unspecified: Secondary | ICD-10-CM | POA: Diagnosis present

## 2017-10-27 DIAGNOSIS — N179 Acute kidney failure, unspecified: Secondary | ICD-10-CM

## 2017-10-27 DIAGNOSIS — Z931 Gastrostomy status: Secondary | ICD-10-CM

## 2017-10-27 DIAGNOSIS — I469 Cardiac arrest, cause unspecified: Secondary | ICD-10-CM | POA: Diagnosis present

## 2017-10-27 DIAGNOSIS — N189 Chronic kidney disease, unspecified: Secondary | ICD-10-CM

## 2017-10-27 DIAGNOSIS — J189 Pneumonia, unspecified organism: Secondary | ICD-10-CM

## 2017-10-27 LAB — BASIC METABOLIC PANEL
ANION GAP: 7 (ref 5–15)
BUN: 27 mg/dL — ABNORMAL HIGH (ref 8–23)
CALCIUM: 8.5 mg/dL — AB (ref 8.9–10.3)
CO2: 30 mmol/L (ref 22–32)
Chloride: 104 mmol/L (ref 98–111)
Creatinine, Ser: 0.95 mg/dL (ref 0.61–1.24)
GFR calc non Af Amer: >60 mL/min (ref >60)
Glucose, Bld: 149 mg/dL — ABNORMAL HIGH (ref 70–99)
POTASSIUM: 3.7 mmol/L (ref 3.5–5.1)
SODIUM: 141 mmol/L (ref 135–145)

## 2017-10-27 LAB — BLOOD GAS, ARTERIAL
ACID-BASE EXCESS: 4.6 mmol/L — AB (ref 0.0–2.0)
BICARBONATE: 29.5 mmol/L — AB (ref 20.0–28.0)
FIO2: 30
LHR: 15 {breaths}/min
MECHVT: 500 mL
O2 SAT: 94.1 %
PEEP/CPAP: 5 cmH2O
PH ART: 7.362 (ref 7.350–7.450)
PO2 ART: 75 mmHg — AB (ref 83.0–108.0)
Patient temperature: 99.8
pCO2 arterial: 53.8 mmHg — ABNORMAL HIGH (ref 32.0–48.0)

## 2017-10-27 LAB — GLUCOSE, CAPILLARY
GLUCOSE-CAPILLARY: 144 mg/dL — AB (ref 70–99)
GLUCOSE-CAPILLARY: 154 mg/dL — AB (ref 70–99)
Glucose-Capillary: 149 mg/dL — ABNORMAL HIGH (ref 70–99)
Glucose-Capillary: 152 mg/dL — ABNORMAL HIGH (ref 70–99)
Glucose-Capillary: 170 mg/dL — ABNORMAL HIGH (ref 70–99)
Glucose-Capillary: 180 mg/dL — ABNORMAL HIGH (ref 70–99)

## 2017-10-27 LAB — VALPROIC ACID LEVEL: VALPROIC ACID LVL: 24 ug/mL — AB (ref 50.0–100.0)

## 2017-10-27 LAB — MAGNESIUM: Magnesium: 2.1 mg/dL (ref 1.7–2.4)

## 2017-10-27 MED ORDER — BISACODYL 5 MG PO TBEC: 5.0000 mg | DELAYED_RELEASE_TABLET | Freq: Every day | ORAL | 0 refills | Status: DC | PRN

## 2017-10-27 MED ORDER — HEPARIN SODIUM (PORCINE) 5000 UNIT/ML IJ SOLN: 5000.0000 [IU] | Freq: Three times a day (TID) | INTRAMUSCULAR | 0 refills | Status: AC

## 2017-10-27 MED ORDER — FAMOTIDINE 20 MG PO TABS: 20.0000 mg | ORAL_TABLET | Freq: Every day | ORAL | 0 refills | Status: AC

## 2017-10-27 MED ORDER — CLONAZEPAM 1 MG PO TABS: 1.0000 mg | ORAL_TABLET | Freq: Two times a day (BID) | ORAL | 0 refills | Status: AC

## 2017-10-27 MED ORDER — LABETALOL HCL 5 MG/ML IV SOLN: 10.0000 mg | Freq: Four times a day (QID) | INTRAVENOUS | Status: DC | PRN

## 2017-10-27 MED ORDER — LISINOPRIL 20 MG PO TABS: 20.0000 mg | ORAL_TABLET | Freq: Every day | ORAL | 0 refills | Status: AC

## 2017-10-27 MED ORDER — IOPAMIDOL (ISOVUE-300) INJECTION 61%
INTRAVENOUS | Status: AC
  Administered 2017-10-27: 50 mL via GASTROSTOMY
  Filled 2017-10-27: qty 50

## 2017-10-27 MED ORDER — INSULIN ASPART 100 UNIT/ML ~~LOC~~ SOLN: 0.0000 [IU] | SUBCUTANEOUS | 11 refills | Status: AC

## 2017-10-27 MED ORDER — LABETALOL HCL 5 MG/ML IV SOLN
10.0000 mg | Freq: Once | INTRAVENOUS | Status: AC
  Administered 2017-10-27: 10 mg via INTRAVENOUS
  Filled 2017-10-27: qty 4

## 2017-10-27 MED ORDER — FUROSEMIDE 10 MG/ML PO SOLN: 20.0000 mg | Freq: Two times a day (BID) | ORAL | 0 refills | Status: AC

## 2017-10-27 MED ORDER — PRO-STAT SUGAR FREE PO LIQD: 60.0000 mL | Freq: Two times a day (BID) | ORAL | 0 refills | Status: AC

## 2017-10-27 MED ORDER — FENTANYL 100 MCG/HR TD PT72: 100.0000 ug | MEDICATED_PATCH | TRANSDERMAL | 0 refills | Status: AC

## 2017-10-27 MED ORDER — VALPROIC ACID 250 MG/5ML PO SOLN: 500.0000 mg | Freq: Three times a day (TID) | ORAL | 0 refills | Status: AC

## 2017-10-27 MED ORDER — VITAL 1.5 CAL PO LIQD: 1000.0000 mL | ORAL | 0 refills | Status: AC

## 2017-10-27 MED ORDER — FENTANYL CITRATE (PF) 100 MCG/2ML IJ SOLN
50.0000 ug | Freq: Once | INTRAMUSCULAR | Status: AC
  Administered 2017-10-27: 50 ug via INTRAVENOUS
  Filled 2017-10-27: qty 2

## 2017-10-27 MED ORDER — INSULIN DETEMIR 100 UNIT/ML ~~LOC~~ SOLN: 10.0000 [IU] | Freq: Two times a day (BID) | SUBCUTANEOUS | 11 refills | Status: AC

## 2017-10-27 MED ORDER — ADULT MULTIVITAMIN LIQUID CH: 15.0000 mL | Freq: Every day | ORAL | 0 refills | Status: AC

## 2017-10-27 MED ORDER — QUETIAPINE FUMARATE 25 MG PO TABS: 25.0000 mg | ORAL_TABLET | Freq: Two times a day (BID) | ORAL | 0 refills | Status: AC

## 2017-10-27 MED ORDER — AMLODIPINE BESYLATE 10 MG PO TABS: 10.0000 mg | ORAL_TABLET | Freq: Every day | ORAL | 0 refills | Status: DC

## 2017-10-27 MED ORDER — POTASSIUM CHLORIDE 20 MEQ PO PACK
40.0000 meq | PACK | Freq: Once | ORAL | Status: AC
  Administered 2017-10-27: 40 meq
  Filled 2017-10-27: qty 2

## 2017-10-27 NOTE — Progress Notes (Signed)
Daily Progress Note   Patient Name: Billy Liu       Date: 10/27/2017 DOB: 1932/03/19  Age: 84 y.o. MRN#: [id number] Attending Physician: Demetrios Loll, MD Primary Care Physician: Alvester Morin, MD Admit Date: 10/06/2017  Reason for Consultation/Follow-up: Psychosocial/spiritual support  Subjective: Patient is resting in bed on ventilator with trach in place. No distress noted.  No family at bedside today.  Plans for transfer to LTAC upon insurance authroization. Recommend palliative to follow at the [facility].  Will sign off. Please reconsult if needed.  Length of Stay: 20  Current Medications: Scheduled Meds:  . amLODipine  10 mg Per Tube Daily  . chlorhexidine gluconate (MEDLINE KIT)  15 mL Mouth Rinse BID  . clonazePAM  1 mg Oral BID  . famotidine  20 mg Per Tube BID  . feeding supplement (PRO-STAT SUGAR FREE 64)  60 mL Per Tube BID  . fentaNYL  100 mcg Transdermal Q72H  . free water  120 mL Per Tube Q4H  . furosemide  20 mg Oral BID  . heparin injection (subcutaneous)  5,000 Units Subcutaneous Q8H  . Influenza vac split quadrivalent PF  0.5 mL Intramuscular Tomorrow-1000  . insulin aspart  0-20 Units Subcutaneous Q4H  . insulin detemir  10 Units Subcutaneous BID  . lisinopril  20 mg Oral Daily  . mouth rinse  15 mL Mouth Rinse 10 times per day  . multivitamin  15 mL Per Tube Daily  . QUEtiapine  25 mg Per Tube BID  . sodium chloride flush  10-40 mL Intracatheter Q12H  . valproic acid  500 mg Per Tube Q8H    Continuous Infusions: . sodium chloride Stopped (10/27/17 0757)  . dexmedetomidine (PRECEDEX) IV infusion Stopped (10/27/17 0301)  . dextrose 5 % and 0.45% NaCl Stopped (10/24/17 1119)  . feeding supplement (VITAL 1.5 CAL) 45 mL/hr at 10/27/17 0600  . fentaNYL  infusion INTRAVENOUS Stopped (10/27/17 0138)    PRN Meds: sodium chloride, acetaminophen **OR** acetaminophen, atropine, bisacodyl, hydrALAZINE, labetalol, midazolam, ondansetron **OR** ondansetron (ZOFRAN) IV, sodium chloride flush  Physical Exam  Constitutional: No distress.  Frail  Pulmonary/Chest:  Trach            Vital Signs: BP (!) 189/76   Pulse (!) 112   Temp 99.7 F (37.6 C) (Oral)   Resp 20  Ht '6\' 3"'$  (1.905 m)   Wt 117.7 kg   SpO2 98%   BMI 32.43 kg/m  SpO2: SpO2: 98 % O2 Device: O2 Device: Ventilator O2 Flow Rate: O2 Flow Rate (L/min): 30 L/min  Intake/output summary:   Intake/Output Summary (Last 24 hours) at 10/27/2017 1215 Last data filed at 10/27/2017 1012 Gross per 24 hour  Intake 1612.74 ml  Output 1175 ml  Net 437.74 ml   LBM: Last BM Date: 10/27/17 Baseline Weight: Weight: 129.5 kg(bed weight) Most recent weight: Weight: 117.7 kg       Palliative Assessment/Data: [name]    Flowsheet Rows     Most Recent Value  Intake Tab  Referral Department  Hospitalist  Unit at Time of Referral  ICU  Palliative Care Primary Diagnosis  Cardiac  Date Notified  10/07/17  Palliative Care Type  New Palliative care  Reason for referral  Clarify Goals of Care  Date of Admission  10/06/17  Date first seen by Palliative Care  10/07/17  # of days Palliative referral response time  0 Day(s)  # of days IP prior to Palliative referral  1  Clinical Assessment  Psychosocial & Spiritual Assessment  Palliative Care Outcomes      Patient Active Problem List   Diagnosis Date Noted  . Dysphagia   . Cardiac arrest ([place]) 10/07/2017  . Iron deficiency anemia due to chronic blood loss   . Symptomatic anemia 06/17/2017  . Pressure injury of skin 06/17/2017  . Atherosclerosis of native arteries of extremity with intermittent claudication ([place]) 12/25/2016  . Chronic venous insufficiency 12/25/2016  . Lymphedema 10/23/2016  . Hyperlipidemia 10/23/2016  . Chronic  diastolic heart failure ([place]) 11/10/2014  . Obstructive sleep apnea 11/10/2014  . CAD (coronary artery disease) 10/11/2014  . COPD (chronic obstructive pulmonary disease) ([place]) 10/11/2014  . Type 2 diabetes mellitus ([place]) 10/11/2014  . HTN (hypertension) 10/11/2014  . GERD (gastroesophageal reflux disease) 10/11/2014  . CKD (chronic kidney disease), stage III ([place]) 10/11/2014    Palliative Care Assessment & Plan    Recommendations/Plan:  Plans to D/C to LTAC upon insurance approval. Recommend palliative to follow at the facility.      Code Status:    Code Status Orders  (From admission, onward)         Start     Ordered   10/07/17 0132  Full code  Continuous     10/07/17 0131        Code Status History    Date Active Date Inactive Code Status Order ID Comments User Context   10/07/2017 0031 10/07/2017 0131 Full Code [id number]  Awilda Bill, NP ED   06/17/2017 1856 06/23/2017 2244 Full Code [id number]  Nicholes Mango, MD Inpatient   02/18/2017 1623 02/18/2017 2133 Full Code [id number]  Delana Meyer, Dolores Lory, MD Inpatient   02/11/2017 1138 02/11/2017 1736 Full Code [id number]  Schnier, Dolores Lory, MD Inpatient   10/24/2014 1934 10/31/2014 1837 Full Code [id number]  Fritzi Mandes, MD Inpatient   10/11/2014 2347 10/17/2014 1844 Full Code [id number]  Lance Coon, MD Inpatient       Prognosis:  Poor longterm.   Discharge Planning:  To Be Determined    Thank you for allowing the Palliative Medicine Team to assist in the care of this patient.   Total Time 15 min Prolonged Time Billed  no      Greater than 50%  of this time was spent counseling and coordinating care related to the above assessment and plan.  Asencion Gowda, NP  Please contact Palliative Medicine Team phone at [phone] for questions and concerns.

## 2017-10-27 NOTE — Progress Notes (Signed)
Pt transported to [facility] via [facility]. Thurston Pounds, RN collected all pt's paperwork and documentation. Pt moved to stretcher care released to [facility] without incident.

## 2017-10-27 NOTE — Progress Notes (Addendum)
CRITICAL CARE NOTE  CC  follow up respiratory failure/vent dependent/self extubated x 2   BRIEF PATIENT DESCRIPTION:  84 yo male admitted with acute on chronic renal failure post cardiac arrest mechanically intubated hypothermic protocol initiated '@36'  degrees C  SIGNIFICANT EVENTS/STUDIES:  09/30-Pt admitted to ICU post cardiac arrest mechanically intubated hypothermic protocol initiated by ER provider 09/30-CT Head and Cervical Spine revealed no acute intracranial abnormality. No skull fracture. Unchanged atrophy, chronic small vessel ischemia, and left parietal encephalomalacia. Advanced multilevel degenerative change in the cervical spine without evidence of acute fracture. Soft tissue prominence involving the right hypopharynx is only partially included in the field of view, this may represent retained secretions or asymmetric positioning of the patient's tongue, however underlying mass lesion is not excluded. Consider contrast enhanced CT of the neck as clinically indicated. Moderate right pleural effusion partially included in the field of view. 10/2-Echo revealed EF 65% 10/3-CT Head revealed no evident anoxic injury.Atrophy and remote left frontal parietal infarct 10/10-Failure to wean from vent and difficult intubation upon reintubation therefore ENT consulted for tracheostomy placement  10/14: S/p Trach 10/21/2017: x-ray of foot was okay, podiatry evaluated- no immediate intervention, 10/23/2017: Status post PEG tube placement, status post PICC line placement, awaiting from [facility] 10/24/2017: Trying to taper sedation drip, added Zyprexa, slightly elevated blood pressure noted, -14 L 10/25/2017: No acute changes 10/20: started on low dose propofol for sedation Subjective: Overall patient is doing well.  Agitation improved with low dose propofol and fentanyl. No major overnight events noted.  Off antibiotics now  No fever  Currently on full support plan is to taper the  sedation and able to try to wean the patient -ABG was reviewed and essentially okay   BP 119/89   Pulse (!) 104   Temp 98.7 F (37.1 C) (Oral)   Resp (!) 23   Ht '6\' 3"'  (1.905 m)   Wt 117.7 kg   SpO2 96%   BMI 32.43 kg/m    REVIEW OF SYSTEMS  PATIENT IS UNABLE TO PROVIDE COMPLETE REVIEW OF SYSTEMS DUE TO SEVERE CRITICAL ILLNESS   PHYSICAL EXAMINATION:  GENERAL:critically ill appearing, HEAD: Normocephalic, atraumatic.  EYES: Pupils equal, round, reactive to light.  No scleral icterus.  MOUTH: Moist mucosal membrane. NECK: Supple. No thyromegaly. S/P trach PULMONARY:CTA with decreases breath sounds at bases without rhonci or wheezing, ETT CARDIOVASCULAR: S1 and S2. Regular rate and rhythm. No murmurs, rubs, or gallops.  GASTROINTESTINAL: Soft, nontender, but tense . No masses. Positive bowel sounds. No hepatosplenomegaly; PEG TUBE  MUSCULOSKELETAL: No swelling, clubbing, or edema.  NEUROLOGIC: sedated heavily SKIN: Left toe dressing and right foot dressing noted   INTAKE/OUTPUT  Intake/Output Summary (Last 24 hours) at 10/27/2017 0707 Last data filed at 10/27/2017 0500 Gross per 24 hour  Intake 1971.05 ml  Output 1105 ml  Net 866.05 ml    LABS  CBC Recent Labs  Lab 10/23/17 1034 10/24/17 0832 10/25/17 0434  WBC 7.5 7.9 9.7  HGB 8.6* 9.2* 8.8*  HCT 28.6* 30.4* 29.3*  PLT 378 438* 447*   Coag's Recent Labs  Lab 10/22/17 0334  INR 1.08   BMET Recent Labs  Lab 10/24/17 0832 10/25/17 0434 10/27/17 0540  NA 139 141 141  K 4.0 3.8 3.7  CL 101 103 104  CO2 '30 29 30  ' BUN 30* 28* 27*  CREATININE 0.87 0.68 0.95  GLUCOSE 193* 172* 149*   Electrolytes Recent Labs  Lab 10/22/17 0334 10/23/17 1034 10/24/17 0832 10/25/17 0434 10/27/17  0540  CALCIUM 9.1 9.4 9.3 9.1 8.5*  MG 2.1 1.8 2.1 2.0 2.1  PHOS 3.2 3.5  --  3.5  --    Sepsis Markers No results for input(s): LATICACIDVEN, PROCALCITON, O2SATVEN in the last 168 hours. ABG Recent Labs  Lab  10/22/17 0328 10/23/17 0435 10/24/17 0439  PHART 7.49* 7.49* 7.48*  PCO2ART 44 46 39  PO2ART 97 66* 72*   Liver Enzymes Recent Labs  Lab 10/23/17 1034 10/24/17 0832 10/25/17 0434  AST '27 24 24  ' ALT '19 20 19  ' ALKPHOS 86 94 113  BILITOT 0.3 0.4 0.4  ALBUMIN 2.7* 3.0* 2.7*   Cardiac Enzymes No results for input(s): TROPONINI, PROBNP in the last 168 hours. Glucose Recent Labs  Lab 10/26/17 0342 10/26/17 0749 10/26/17 1543 10/26/17 1942 10/27/17 0013 10/27/17 0358  GLUCAP 146* 140* 174* 126* 149* 144*     No results found for this or any previous visit (from the past 240 hour(s)).  MEDICATIONS   Current Facility-Administered Medications:  .  0.9 %  sodium chloride infusion, , Intravenous, PRN, Tukov-Yual, Gracieann Stannard S, NP, Last Rate: 10 mL/hr at 10/26/17 2015, 1,000 mL at 10/26/17 2015 .  acetaminophen (TYLENOL) tablet 650 mg, 650 mg, Oral, Q6H PRN **OR** acetaminophen (TYLENOL) suppository 650 mg, 650 mg, Rectal, Q6H PRN, Amelia Jo, MD .  amLODipine (NORVASC) tablet 10 mg, 10 mg, Per Tube, Daily, Epifanio Lesches, MD, 10 mg at 10/25/17 1034 .  atropine 1 MG/10ML injection 1 mg, 1 mg, Intravenous, PRN, Awilda Bill, NP, 1 mg at 10/07/17 1824 .  bisacodyl (DULCOLAX) EC tablet 5 mg, 5 mg, Oral, Daily PRN, Amelia Jo, MD, 5 mg at 10/26/17 2115 .  chlorhexidine gluconate (MEDLINE KIT) (PERIDEX) 0.12 % solution 15 mL, 15 mL, Mouth Rinse, BID, Blakeney, Dreama Saa, NP, 15 mL at 10/26/17 2059 .  clonazePAM (KLONOPIN) tablet 1 mg, 1 mg, Oral, BID, Tyler Pita, MD, 1 mg at 10/26/17 2109 .  dexmedetomidine (PRECEDEX) 400 MCG/100ML (4 mcg/mL) infusion, 0.4-1.2 mcg/kg/hr, Intravenous, Titrated, Lahoma Rocker, MD, Stopped at 10/27/17 0301 .  dextrose 5 %-0.45 % sodium chloride infusion, , Intravenous, Continuous, Lahoma Rocker, MD, Stopped at 10/24/17 1119 .  famotidine (PEPCID) tablet 20 mg, 20 mg, Per Tube, BID, Charlett Nose, RPH, 20 mg at 10/26/17 2109 .   feeding supplement (PRO-STAT SUGAR FREE 64) liquid 60 mL, 60 mL, Per Tube, BID, Lahoma Rocker, MD, 60 mL at 10/26/17 2114 .  feeding supplement (VITAL 1.5 CAL) liquid 1,000 mL, 1,000 mL, Per Tube, Continuous, Lahoma Rocker, MD, Last Rate: 45 mL/hr at 10/27/17 0200 .  fentaNYL (DURAGESIC - dosed mcg/hr) 100 mcg, 100 mcg, Transdermal, Q72H, Demetrios Loll, MD, 100 mcg at 10/26/17 1003 .  fentaNYL 2596mg in NS 2551m(1059mml) infusion-PREMIX, 25-100 mcg/hr, Intravenous, Continuous, GonTyler PitaD, Stopped at 10/27/17 0138 .  free water 120 mL, 120 mL, Per Tube, Q4H, ShaManuella Ghaziutul, MD, 120 mL at 10/27/17 0418 .  furosemide (LASIX) 10 MG/ML solution 20 mg, 20 mg, Oral, BID, ShaLahoma RockerD, 20 mg at 10/26/17 0906 .  heparin injection 5,000 Units, 5,000 Units, Subcutaneous, Q8H, ShaLahoma RockerD, 5,000 Units at 10/27/17 0451 .  hydrALAZINE (APRESOLINE) injection 10-20 mg, 10-20 mg, Intravenous, Q2H PRN, ShaLahoma RockerD, 20 mg at 10/26/17 2142 .  Influenza vac split quadrivalent PF (FLUZONE HIGH-DOSE) injection 0.5 mL, 0.5 mL, Intramuscular, Tomorrow-1000, Blakeney, Dana G, NP .  insulin aspart (novoLOG) injection 0-20 Units, 0-20 Units, Subcutaneous, Q4H, Tukov-Yual, Markeisha Mancias S,  NP, 3 Units at 10/27/17 0418 .  insulin detemir (LEVEMIR) injection 10 Units, 10 Units, Subcutaneous, BID, Lahoma Rocker, MD, 10 Units at 10/26/17 2109 .  lisinopril (PRINIVIL,ZESTRIL) tablet 20 mg, 20 mg, Oral, Daily, Lahoma Rocker, MD, 20 mg at 10/25/17 1034 .  MEDLINE mouth rinse, 15 mL, Mouth Rinse, 10 times per day, Awilda Bill, NP, 15 mL at 10/27/17 0541 .  midazolam (VERSED) injection 1 mg, 1 mg, Intravenous, Q2H PRN, Awilda Bill, NP, 1 mg at 10/25/17 1422 .  multivitamin liquid 15 mL, 15 mL, Per Tube, Daily, Flora Lipps, MD, 15 mL at 10/26/17 0906 .  ondansetron (ZOFRAN) tablet 4 mg, 4 mg, Oral, Q6H PRN **OR** ondansetron (ZOFRAN) injection 4 mg, 4 mg, Intravenous, Q6H PRN, Amelia Jo, MD .  propofol (DIPRIVAN)  1000 MG/100ML infusion, 5-80 mcg/kg/min, Intravenous, Titrated, Tyler Pita, MD, Stopped at 10/26/17 0930 .  QUEtiapine (SEROQUEL) tablet 25 mg, 25 mg, Per Tube, BID, Tyler Pita, MD, 25 mg at 10/26/17 2109 .  sodium chloride flush (NS) 0.9 % injection 10-40 mL, 10-40 mL, Intracatheter, Q12H, Lahoma Rocker, MD, 10 mL at 10/26/17 2110 .  sodium chloride flush (NS) 0.9 % injection 10-40 mL, 10-40 mL, Intracatheter, PRN, Lahoma Rocker, MD .  valproic acid (DEPAKENE) solution 500 mg, 500 mg, Per Tube, Q8H, Epifanio Lesches, MD, 500 mg at 10/27/17 0451      Indwelling Urinary Catheter continued, requirement due to   Reason to continue Indwelling Urinary Catheter for strict Intake/Output monitoring for hemodynamic instability   Central Line continued, requirement due to   Reason to continue [employer] Monitoring of central venous pressure or other hemodynamic parameters   Ventilator continued, requirement due to, resp failure    Ventilator Sedation RASS 0 to -2     ASSESSMENT AND PLAN SYNOPSIS Acute respiratory failure secondary to LLL pneumonia and pulmonary edema  Mechanical intubation s/p cardiac arrest status post PEG and trach on 10/20/2017 Hx: COPD and Obesity  Full vent support-vent Prn bronchodilator therapy- discussed with nursing at length about sedation vacation and tapering the sedation drips - Continue PRN Zyprexa and propofol for comfort and sedation - DC Precedex  -Continue VAP bundle  Discussed with nursing regarding sedation vacation and titration of current sedative just for comfort and to ensure patient is awake during the day. Try to align sedatives with patient's circadian rhythm  Acute on chronic diastolic CHF Cardiac Arrest (cardiac rhythm unknown)-hypothermic protocol completed Bradycardia-resolved Hx: HTN, CAD, and Hyperlipidemia  Continuous telemetry monitoring  Maintain map >65 -Continue IV diuresis and potassium  supplementation -Trend renal indices -Continue current blood pressure regimen - Keep systolic blood pressure less than 150 limitus mercury  Acute on chronic renal failure with hyperkalemia-resolved Metabolic acidosis-resolved  Trend BMP-- Replace K Replace electrolytes as indicated  Monitor UOP  Avoid nephrotoxic medications   LLL pneumonia  Diabetic Ulcers Bilateral Lower Extremities Trend WBC and monitor fever curve  Pleated meropenum/vanco Monitor if further worsening of sepsis will rpt culture and start antibiotics again Podiatry consult for foot-suggested wound looks okay and no further acute intervention Xray foot-showed soft tissue swelling no evidence of osteomyelitis no further recommendation except wound dressing per podiatry  Anemia without obvious acute blood loss  Trend CBC  Monitor for s/sx of bleeding  Transfuse for hgb <7 VTE HW:EXHB heparin resume tonight  Nutrition Constipation  SUP px: pepcid Continue TF's per PEG placement continue bowel regimen   Hyperglycemiadoing better SSI  Acute encephalopathy Agitation/Delirium Hx: Dementia,  Depression, and Anxiety Maintain RASS goal 0  Low-dose propofol for comfort and sedation   Skin/Wound: Foot dressing   Electrolytes: Replace electrolytes per ICU electrolyte replacement protocol.   IVF: none  Nutrition: Tube feeds as tolerated per PEG  Prophylaxis: DVT Prophylaxis with Heparin,. GI Prophylaxis.   Restraints: Soft limb to prevent self extubation  PT/OT eval and treat. OOB when appropriate.   Lines/Tubes: 17 days per urology foley PICC line placed on 10/23/2017  ADVANCE DIRECTIVE:Full code  FAMILY DISCUSSION:Palliative following   Quality Care: PPI, DVT prophylaxis, HOB elevated, Infection control all reviewed and addressed.  Events and notes from last 24 hours reviewed. Care plan discussed on multidisciplinary rounds  CC TIME:34 min  -[facility] will [facility] us [facility] and we will proceed with weaning and transfer   Old records reviewed discussed results and management plan with patient  Images personally reviewed and results and labs reviewed and discussed with patient.  All medication reviewed and adjusted  Further management depending on test results and work up as outlined above.   Dewey Viens S. [facility] ANP-BC Pulmonary and Critical Care Medicine [facility] Pager [phone] or [phone]  NB: This document was prepared using Dragon voice recognition software and may include unintentional dictation errors.   10/27/2017 7:07 AM  Pulmonary & Critical Care Medicine

## 2017-10-27 NOTE — Progress Notes (Addendum)
Name: Billy Liu MRN: [id number] DOB: 1932/10/19     CONSULTATION DATE: 10/06/2017 Subjective & Objectives: Off pressers, off sedation awaiting placement.  PAST MEDICAL HISTORY :   has a past medical history of Anxiety, Cellulitis (10/17/2014), Chronic diastolic CHF (congestive heart failure) (HCC), CKD (chronic kidney disease), stage III (HCC), COPD (chronic obstructive pulmonary disease) (HCC), Coronary artery disease, Dementia (HCC), Depression, Diabetes mellitus without complication (HCC), GERD (gastroesophageal reflux disease), Hyperlipemia, Hypertension, Obesity, and Renal disorder.  has a past surgical history that includes Lower Extremity Angiography (Left, 02/11/2017); PERIPHERAL VASCULAR ATHERECTOMY (Left, 02/18/2017); Lower Extremity Angiography (Left, 03/11/2017); LOWER EXTREMITY INTERVENTION (03/11/2017); Esophagogastroduodenoscopy (N/A, 06/20/2017); Colonoscopy (N/A, 06/20/2017); Tracheostomy tube placement (N/A, 10/20/2017); and PEG placement (N/A, 10/23/2017). Prior to Admission medications   Medication Sig Start Date End Date Taking? Authorizing Provider  acetaminophen (TYLENOL) 500 MG tablet Take 500 mg by mouth 3 (three) times daily.   Yes [provider]  amLODipine (NORVASC) 10 MG tablet Take 10 mg by mouth daily.   Yes [provider]  aspirin EC 81 MG tablet Take 81 mg by mouth daily.   Yes [provider]  Cholecalciferol (VITAMIN D) 2000 units tablet Take 2,000 Units by mouth daily.   Yes [provider]  cloNIDine (CATAPRES) 0.2 MG tablet Take 1 tablet (0.2 mg total) by mouth 3 (three) times daily. 10/31/14  Yes Houston Siren, MD  divalproex (DEPAKOTE) 250 MG DR tablet Take 250 mg by mouth daily.    Yes [provider]  divalproex (DEPAKOTE) 500 MG DR tablet Take 500 mg by mouth at bedtime.   Yes [provider]  ferrous sulfate 324 (65 Fe) MG TBEC Take 324 mg by mouth daily.   Yes [provider]    finasteride (PROSCAR) 5 MG tablet Take 1 tablet (5 mg total) by mouth daily. 10/31/14  Yes Sainani, Rolly Pancake, MD  glucagon, human recombinant, (GLUCAGEN DIAGNOSTIC) 1 MG injection Inject 1 mg into the muscle once as needed for low blood sugar.   Yes [provider]  hydrALAZINE (APRESOLINE) 100 MG tablet Take 100 mg by mouth 3 (three) times daily.   Yes [provider]  insulin detemir (LEVEMIR) 100 UNIT/ML injection Inject 40 Units into the skin 2 (two) times daily.    Yes [provider]  metoprolol succinate (TOPROL-XL) 100 MG 24 hr tablet Take 300 mg by mouth daily.    Yes [provider]  pantoprazole (PROTONIX) 40 MG tablet Take 1 tablet (40 mg total) by mouth daily. 06/23/17  Yes Sudini, Wardell Heath, MD  polyethylene glycol (MIRALAX / GLYCOLAX) packet Take 17 g by mouth daily.    Yes [provider]  senna (SENOKOT) 8.6 MG TABS tablet Take 2 tablets by mouth at bedtime.   Yes [provider]  sertraline (ZOLOFT) 25 MG tablet Take 75 mg by mouth daily.    Yes [provider]  simvastatin (ZOCOR) 20 MG tablet Take 20 mg by mouth at bedtime.    Yes [provider]  Sunscreens (AVEENO DAILY MOISTURIZER) LOTN Apply topically at bedtime. Apply to bilateral feet and legs   Yes [provider]  torsemide (DEMADEX) 20 MG tablet Take 2 tablets (40 mg total) by mouth daily. 10/31/14  Yes Sainani, Rolly Pancake, MD  vitamin C (ASCORBIC ACID) 500 MG tablet Take 500 mg by mouth daily.   Yes [provider]  omeprazole (PRILOSEC) 40 MG capsule Take 1 capsule (40 mg total) by mouth 2 (two) times  daily for 14 days. 06/25/17 07/09/17  Toney Reil, MD   Allergies  Allergen Reactions  . Penicillins Other (See Comments)    Per MAR Has patient had a PCN reaction causing immediate rash, facial/tongue/throat swelling, SOB or lightheadedness with hypotension: Unknown Has patient had a PCN reaction causing severe rash involving  mucus membranes or skin necrosis: Unknown Has patient had a PCN reaction that required hospitalization: Unknown Has patient had a PCN reaction occurring within the last 10 years: Unknown If all of the above answers are "NO", then may proceed with Cephalosporin use.     FAMILY HISTORY:  family history includes Cancer in his brother. SOCIAL HISTORY:  reports that he has never smoked. He has quit using smokeless tobacco.  His smokeless tobacco use included chew. He reports that he does not drink alcohol or use drugs.  REVIEW OF SYSTEMS:   Unable to obtain due to critical illness   VITAL SIGNS: Temp:  [98.2 F (36.8 C)-99.7 F (37.6 C)] 98.7 F (37.1 C) (10/21 1200) Pulse Rate:  [85-137] 114 (10/21 1200) Resp:  [15-32] 25 (10/21 1200) BP: (116-219)/(45-153) 137/113 (10/21 1200) SpO2:  [96 %-100 %] 98 % (10/21 1200) FiO2 (%):  [30 %] 30 % (10/21 1125) Weight:  [117.7 kg] 117.7 kg (10/21 0500)  Physical Examination:  Awake, following commands and moving all extremities On vent, trach in place, no distress, BEAE and no rales S1 & S2 audible and no murmur Benign abdomen with PEG in place. Scrotal edema 2 + Wasted extremities, dressed ulcer Lt. Foot and 1st toe. No edema  ASSESSMENT / PLAN:  Ventilator dependent respiratory failure s/p trach and PEG. Didn't tolerate SBT and moderate respiratory secretions. -Vent and trach weaning as tolerated and awaiting placement  Altered mental status with history of dementia. Slowing pattern on EEG old stroke on MRI. On Valproic acid. -Supportive care  Pneumonia s/p R/ with Mero + Vanc. Improved bibasilar airspace disease  S/p cardiac arrest with diastolic heart failure on diuresis  MRSA infected Lt. Foot and 1st toe  Ulcer  AKI on CKD St III  Dysphagia on PEG feeding  Anemia -Keep Hb > 7 gm/dl  Full code  DVT & GI prophylaxis. Continue with supportive care  Critical care time 40 min

## 2017-10-27 NOTE — Care Management Note (Signed)
Case Management Note  Patient Details  Name: Billy Liu MRN: [id number] Date of Birth: 17-Aug-1932  Subjective/Objective:       Patient has been approved and accepted by [facility] [place].  Authorization was completed today 10/27/17.  Damian Leavell the pateint's brother is aware that the patient is being transferred to long term acute care and consents to transfer.   Physician completing EMTALA form and discharge summary.  Bedside RN to contact [facility] for transport.            Action/Plan:  Transfer to LTAC  Expected Discharge Date:                  Expected Discharge Plan:  Long Term Acute Care (LTAC)  In-House Referral:     Discharge planning Services  CM Consult  Post Acute Care Choice:  Long Term Acute Care (LTAC) Choice offered to:  Sibling(Brother Damian Leavell)  DME Arranged:    DME Agency:     HH Arranged:    HH Agency:     Status of Service:  Completed, signed off  If discussed at Long Length of Stay Meetings, dates discussed:    Additional Comments:  Allayne Butcher, RN 10/27/2017, 3:28 PM

## 2017-10-27 NOTE — Progress Notes (Signed)
[facility] RN called reference report for [facility]. Report given and [facility] RN called for an updated ETA.

## 2017-10-27 NOTE — Discharge Instructions (Signed)
Follow discharge instructions as given by provider

## 2017-10-27 NOTE — Consult Note (Signed)
Pharmacy Electrolyte Monitoring Consult:  Pharmacy consulted to assist in monitoring and replacing electrolytes in this 84 y.o. male admitted on 10/06/2017 with Cardiac Arrest   Labs:  Sodium (mmol/L)  Date Value  10/27/2017 141  07/04/2013 133 (L)   Potassium (mmol/L)  Date Value  10/27/2017 3.7  07/04/2013 4.3   Magnesium (mg/dL)  Date Value  16/10/9602 2.1   Phosphorus (mg/dL)  Date Value  54/09/8117 3.5   Calcium (mg/dL)  Date Value  14/78/2956 8.5 (L)   Calcium, Total (mg/dL)  Date Value  21/30/8657 8.7   Albumin (g/dL)  Date Value  84/69/6295 2.7 (L)  07/04/2013 3.6    Assessment/Plan: 1. Electrolytes: Patient ordered furosemide 20mg  PO BID. KCl 40 mEq VT ordered. Receiving free water 120 mL VT q4h.. Will check with AM labs.   2. Glucose mangagment: BG 126-154 within last 24 hours. Continue SSI q4h and Levemir 10 units SQ BID. PEG placed 10/18; receiving tube feeds.  3. Constipation Management: Flexiseal in place.   Pharmacy will continue to monitor and adjust per consult.   Mauri Reading, PharmD Pharmacy Resident  10/27/2017 11:23 AM

## 2017-10-27 NOTE — Progress Notes (Addendum)
Patient ID: Billy Liu, male   DOB: Sep 09, 1932, 84 y.o.   MRN: [id number]  [employer] PROGRESS NOTE  Billy Liu GQQ:[id number] DOB: January 29, 1932 DOA: 10/06/2017 PCP: Alvester Morin, MD  HPI/Subjective:  Patient is status post trach and PEG.  Unresponsive, on ventilation.  Blood pressure is elevated. Objective: Vitals:   10/27/17 1500 10/27/17 1533  BP:    Pulse:    Resp:    Temp:    SpO2: 93% 94%    Filed Weights   10/24/17 0423 10/26/17 0436 10/27/17 0500  Weight: 117 kg 115.8 kg 117.7 kg    ROS: Review of Systems  Unable to perform ROS: Acuity of condition   Exam: Physical Exam  HENT:  Nose: No mucosal edema.  Eyes: Conjunctivae and lids are normal.  Neck: Carotid bruit is not present.  Cardiovascular: Regular rhythm, S1 normal, S2 normal and normal heart sounds.  Respiratory: He has decreased breath sounds. He has no wheezes. He has no rhonchi.  GI: Bowel sounds are normal. He exhibits no distension. There is no guarding.  Musculoskeletal:       Right ankle: He exhibits swelling.       Left ankle: He exhibits swelling.  Neurological:  Unable to exam.  Skin: Skin is warm.      Data Reviewed: Basic Metabolic Panel: Recent Labs  Lab 10/22/17 0334 10/23/17 1034 10/24/17 0832 10/25/17 0434 10/27/17 0540  NA 140 141 139 141 141  K 3.4* 3.7 4.0 3.8 3.7  CL 103 103 101 103 104  CO2 '31 30 30 29 30  ' GLUCOSE 191* 125* 193* 172* 149*  BUN 38* 39* 30* 28* 27*  CREATININE 0.74 0.70 0.87 0.68 0.95  CALCIUM 9.1 9.4 9.3 9.1 8.5*  MG 2.1 1.8 2.1 2.0 2.1  PHOS 3.2 3.5  --  3.5  --    Liver Function Tests: Recent Labs  Lab 10/22/17 0334 10/23/17 1034 10/24/17 0832 10/25/17 0434  AST '28 27 24 24  ' ALT '20 19 20 19  ' ALKPHOS 88 86 94 113  BILITOT 0.2* 0.3 0.4 0.4  PROT 6.8 6.8 7.9 7.3  ALBUMIN 2.6* 2.7* 3.0* 2.7*   CBC: Recent Labs  Lab 10/21/17 0427 10/22/17 0334 10/23/17 1034 10/24/17 0832 10/25/17 0434  WBC 5.7 6.7 7.5 7.9 9.7   NEUTROABS 3.5 4.5 5.7 5.8 7.8*  HGB 9.1* 8.8* 8.6* 9.2* 8.8*  HCT 30.8* 29.7* 28.6* 30.4* 29.3*  MCV 86.8 84.4 83.6 83.1 83.0  PLT 368 398 378 438* 447*   Cardiac Enzymes: No results for input(s): CKTOTAL, CKMB, CKMBINDEX, TROPONINI in the last 168 hours.  CBG: Recent Labs  Lab 10/27/17 0013 10/27/17 0358 10/27/17 0711 10/27/17 1200 10/27/17 1613  GLUCAP 149* 144* 154* 180* 170*    No results found for this or any previous visit (from the past 240 hour(s)).   Studies: Dg Chest Port 1 View  Result Date: 10/27/2017 CLINICAL DATA:  Recent aspiration EXAM: PORTABLE CHEST 1 VIEW COMPARISON:  10/24/2017 FINDINGS: Tracheostomy tube and right-sided PICC line are again seen and stable. Cardiac shadow remains enlarged. Some increasing basilar atelectasis is noted bilaterally. No pneumothorax or sizable effusion is noted. No other focal abnormality is noted. IMPRESSION: Increasing bibasilar atelectasis. Electronically Signed   By: Inez Catalina M.D.   On: 10/27/2017 13:12    Scheduled Meds: . amLODipine  10 mg Per Tube Daily  . chlorhexidine gluconate (MEDLINE KIT)  15 mL Mouth Rinse BID  . clonazePAM  1 mg Oral BID  . famotidine  20 mg Per Tube BID  . feeding supplement (PRO-STAT SUGAR FREE 64)  60 mL Per Tube BID  . fentaNYL  100 mcg Transdermal Q72H  . free water  120 mL Per Tube Q4H  . furosemide  20 mg Oral BID  . heparin injection (subcutaneous)  5,000 Units Subcutaneous Q8H  . Influenza vac split quadrivalent PF  0.5 mL Intramuscular Tomorrow-1000  . insulin aspart  0-20 Units Subcutaneous Q4H  . insulin detemir  10 Units Subcutaneous BID  . lisinopril  20 mg Oral Daily  . mouth rinse  15 mL Mouth Rinse 10 times per day  . multivitamin  15 mL Per Tube Daily  . QUEtiapine  25 mg Per Tube BID  . sodium chloride flush  10-40 mL Intracatheter Q12H  . valproic acid  500 mg Per Tube Q8H   Continuous Infusions: . sodium chloride Stopped (10/27/17 0757)  . dexmedetomidine  (PRECEDEX) IV infusion Stopped (10/27/17 0301)  . dextrose 5 % and 0.45% NaCl Stopped (10/24/17 1119)  . feeding supplement (VITAL 1.5 CAL) 45 mL/hr at 10/27/17 0600  . fentaNYL infusion INTRAVENOUS Stopped (10/27/17 0138)    Assessment/Plan:  1. Cardiac arrest.  Patient underwent hypothermia protocol.  Continue supportive care.  MRI shows old stroke neurology saw the patient, supportive care, patient seen by neurology, had EEG, showed background slowing.,  Family want trach and PEG, patient had trach and PEG. LTAC placement. 2. ventilator dependent respiratory failure,   continue trach care, bronchodilators. 3. LLL pneumonia. Finished meropenum/vanco. 4. right lower extremity ulceration with foul-smelling discharge, left first toe ulceration and swelling of the first toe likely chronic infection.  Finished IV vancomycin, meropenem.  Wound cultures are positive for MRSA. 5. Acute kidney injury on CKD stage 3 and hyperkalemia.   This has improved. 6. Acute on chronic diastolic congestive heart failure, seen by cardiology.  Continue Lasix twice daily. 7. Overall prognosis poor and high risk for cardiopulmonary arrest.   8. Anemia.  Responded to transfusion.  Hemoglobin is stable. #8 diabetes mellitus type 2: Continue Levemir 10 units twice daily 9,History of dementia, patient is on Depakote Episodes of anxiety, patient has been on Xanax, Seroquel, has mittens on to help with treatments.  10. acute encephalopathy, agitation, delirium, dementia, depression, anxiety: Patient is off fentanyl, Versed. [name] #11 hypernatremia, improved. Accelerated hypertension.  Continue hypertension medication via PEG. High risk for cardiac arrest.  Condition critical .family wants full code.  Status post trach, PEG. He is to be discharged to  [facility]. Code Status:     Code Status Orders  (From admission, onward)         Start     Ordered   10/07/17 0132  Full code  Continuous     10/07/17 0131        Code  Status History    Date Active Date Inactive Code Status Order ID Comments User Context   10/07/2017 0031 10/07/2017 0131 Full Code [id number]  Awilda Bill, NP ED   06/17/2017 1856 06/23/2017 2244 Full Code [id number]  Nicholes Mango, MD Inpatient   02/18/2017 1623 02/18/2017 2133 Full Code [id number]  Delana Meyer, Dolores Lory, MD Inpatient   02/11/2017 1138 02/11/2017 1736 Full Code [id number]  Schnier, Dolores Lory, MD Inpatient   10/24/2014 1934 10/31/2014 1837 Full Code [id number]  Fritzi Mandes, MD Inpatient   10/11/2014 2347 10/17/2014 1844 Full Code [id number]  Lance Coon, MD Inpatient     Family Communication: As per critical care team Disposition Plan: LTAC.  Consultants:  Critical care specialist  Antibiotics:  Meropenem  Time spent: 26 minutes [place]

## 2017-10-27 NOTE — Progress Notes (Signed)
Pt being transferred to [facility]. Report called to Ottis Stain, RN [phone]. Pt going to room 5E25.

## 2017-10-27 NOTE — Progress Notes (Signed)
BP elevated 171/60 and HR up to 120-130's, NP aware. See new orders.

## 2017-10-27 NOTE — Discharge Summary (Signed)
Name: Billy Liu MRN: [id number] DOB: 27-Nov-1932     CONSULTATION DATE: 10/06/2017 Subjective & Objectives: Off pressers, off sedation awaiting placement.  PAST MEDICAL HISTORY :   has a past medical history of Anxiety, Cellulitis (10/17/2014), Chronic diastolic CHF (congestive heart failure) (HCC), CKD (chronic kidney disease), stage III (HCC), COPD (chronic obstructive pulmonary disease) (HCC), Coronary artery disease, Dementia (HCC), Depression, Diabetes mellitus without complication (HCC), GERD (gastroesophageal reflux disease), Hyperlipemia, Hypertension, Obesity, and Renal disorder.  has a past surgical history that includes Lower Extremity Angiography (Left, 02/11/2017); PERIPHERAL VASCULAR ATHERECTOMY (Left, 02/18/2017); Lower Extremity Angiography (Left, 03/11/2017); LOWER EXTREMITY INTERVENTION (03/11/2017); Esophagogastroduodenoscopy (N/A, 06/20/2017); Colonoscopy (N/A, 06/20/2017); Tracheostomy tube placement (N/A, 10/20/2017); and PEG placement (N/A, 10/23/2017). Prior to Admission medications   Medication Sig Start Date End Date Taking? Authorizing Provider  acetaminophen (TYLENOL) 500 MG tablet Take 500 mg by mouth 3 (three) times daily.   Yes [provider]  amLODipine (NORVASC) 10 MG tablet Take 10 mg by mouth daily.   Yes [provider]  aspirin EC 81 MG tablet Take 81 mg by mouth daily.   Yes [provider]  Cholecalciferol (VITAMIN D) 2000 units tablet Take 2,000 Units by mouth daily.   Yes [provider]  cloNIDine (CATAPRES) 0.2 MG tablet Take 1 tablet (0.2 mg total) by mouth 3 (three) times daily. 10/31/14  Yes Houston Siren, MD  divalproex (DEPAKOTE) 250 MG DR tablet Take 250 mg by mouth daily.    Yes [provider]  divalproex (DEPAKOTE) 500 MG DR tablet Take 500 mg by mouth at bedtime.   Yes [provider]  ferrous sulfate 324 (65 Fe) MG TBEC Take 324 mg by mouth daily.   Yes [provider]    finasteride (PROSCAR) 5 MG tablet Take 1 tablet (5 mg total) by mouth daily. 10/31/14  Yes Sainani, Rolly Pancake, MD  glucagon, human recombinant, (GLUCAGEN DIAGNOSTIC) 1 MG injection Inject 1 mg into the muscle once as needed for low blood sugar.   Yes [provider]  hydrALAZINE (APRESOLINE) 100 MG tablet Take 100 mg by mouth 3 (three) times daily.   Yes [provider]  insulin detemir (LEVEMIR) 100 UNIT/ML injection Inject 40 Units into the skin 2 (two) times daily.    Yes [provider]  metoprolol succinate (TOPROL-XL) 100 MG 24 hr tablet Take 300 mg by mouth daily.    Yes [provider]  pantoprazole (PROTONIX) 40 MG tablet Take 1 tablet (40 mg total) by mouth daily. 06/23/17  Yes Sudini, Wardell Heath, MD  polyethylene glycol (MIRALAX / GLYCOLAX) packet Take 17 g by mouth daily.    Yes [provider]  senna (SENOKOT) 8.6 MG TABS tablet Take 2 tablets by mouth at bedtime.   Yes [provider]  sertraline (ZOLOFT) 25 MG tablet Take 75 mg by mouth daily.    Yes [provider]  simvastatin (ZOCOR) 20 MG tablet Take 20 mg by mouth at bedtime.    Yes [provider]  Sunscreens (AVEENO DAILY MOISTURIZER) LOTN Apply topically at bedtime. Apply to bilateral feet and legs   Yes [provider]  torsemide (DEMADEX) 20 MG tablet Take 2 tablets (40 mg total) by mouth daily. 10/31/14  Yes Sainani, Rolly Pancake, MD  vitamin C (ASCORBIC ACID) 500 MG tablet Take 500 mg by mouth daily.   Yes [provider]  omeprazole (PRILOSEC) 40 MG capsule Take 1 capsule (40 mg total) by mouth 2 (two) times  daily for 14 days. 06/25/17 07/09/17  Toney Reil, MD   Allergies  Allergen Reactions  . Penicillins Other (See Comments)    Per MAR Has patient had a PCN reaction causing immediate rash, facial/tongue/throat swelling, SOB or lightheadedness with hypotension: Unknown Has patient had a PCN reaction causing severe rash involving  mucus membranes or skin necrosis: Unknown Has patient had a PCN reaction that required hospitalization: Unknown Has patient had a PCN reaction occurring within the last 10 years: Unknown If all of the above answers are "NO", then may proceed with Cephalosporin use.     FAMILY HISTORY:  family history includes Cancer in his brother. SOCIAL HISTORY:  reports that he has never smoked. He has quit using smokeless tobacco.  His smokeless tobacco use included chew. He reports that he does not drink alcohol or use drugs.  REVIEW OF SYSTEMS:   Unable to obtain due to critical illness   VITAL SIGNS: Temp:  [98.2 F (36.8 C)-99.7 F (37.6 C)] 98.7 F (37.1 C) (10/21 1200) Pulse Rate:  [85-137] 114 (10/21 1200) Resp:  [15-32] 25 (10/21 1200) BP: (116-219)/(45-153) 137/113 (10/21 1200) SpO2:  [96 %-100 %] 98 % (10/21 1200) FiO2 (%):  [30 %] 30 % (10/21 1125) Weight:  [117.7 kg] 117.7 kg (10/21 0500)  Physical Examination:  Awake, following commands and moving all extremities On vent, trach in place, no distress, BEAE and no rales S1 & S2 audible and no murmur Benign abdomen with PEG in place. Scrotal edema 2 + Wasted extremities, dressed ulcer Lt. Foot and 1st toe. No edema  Course of admission:  Ventilator dependent respiratory failure s/p trach and PEG. Didn't tolerate SBT and moderate respiratory secretions. -Vent and trach weaning as tolerated and awaiting placement  Altered mental status with history of dementia. Slowing pattern on EEG old stroke on MRI. On Valproic acid. -Supportive care  Pneumonia s/p R/ with Mero + Vanc. Improved bibasilar airspace disease  S/p cardiac arrest with diastolic heart failure on diuresis  MRSA infected Lt. Foot and 1st toe  Ulcer  AKI on CKD St III  Dysphagia on PEG feeding  Anemia -Keep Hb > 7 gm/dl  Full code  DVT & GI prophylaxis. Continue with supportive care

## 2017-10-28 DIAGNOSIS — J189 Pneumonia, unspecified organism: Secondary | ICD-10-CM

## 2017-10-28 DIAGNOSIS — J9621 Acute and chronic respiratory failure with hypoxia: Secondary | ICD-10-CM

## 2017-10-28 DIAGNOSIS — N189 Chronic kidney disease, unspecified: Secondary | ICD-10-CM

## 2017-10-28 DIAGNOSIS — I469 Cardiac arrest, cause unspecified: Secondary | ICD-10-CM

## 2017-10-28 DIAGNOSIS — N179 Acute kidney failure, unspecified: Secondary | ICD-10-CM

## 2017-10-28 DIAGNOSIS — J410 Simple chronic bronchitis: Secondary | ICD-10-CM

## 2017-10-28 DIAGNOSIS — F0390 Unspecified dementia without behavioral disturbance: Secondary | ICD-10-CM

## 2017-10-28 LAB — CBC WITH DIFFERENTIAL/PLATELET
Abs Immature Granulocytes: 0.09 10*3/uL — ABNORMAL HIGH (ref 0.00–0.07)
BASOS ABS: 0.1 10*3/uL (ref 0.0–0.1)
Basophils Relative: 1 %
Eosinophils Absolute: 0.3 10*3/uL (ref 0.0–0.5)
Eosinophils Relative: 2 %
HCT: 26.9 % — ABNORMAL LOW (ref 39.0–52.0)
HEMOGLOBIN: 7.7 g/dL — AB (ref 13.0–17.0)
IMMATURE GRANULOCYTES: 1 %
LYMPHS PCT: 7 %
Lymphs Abs: 0.9 10*3/uL (ref 0.7–4.0)
MCH: 23.8 pg — ABNORMAL LOW (ref 26.0–34.0)
MCHC: 28.6 g/dL — ABNORMAL LOW (ref 30.0–36.0)
MCV: 83 fL (ref 80.0–100.0)
MONOS PCT: 8 %
Monocytes Absolute: 1.1 10*3/uL — ABNORMAL HIGH (ref 0.1–1.0)
NEUTROS PCT: 81 %
NRBC: 0 % (ref 0.0–0.2)
Neutro Abs: 10.8 10*3/uL — ABNORMAL HIGH (ref 1.7–7.7)
Platelets: 542 10*3/uL — ABNORMAL HIGH (ref 150–400)
RBC: 3.24 MIL/uL — ABNORMAL LOW (ref 4.22–5.81)
RDW: 17.8 % — ABNORMAL HIGH (ref 11.5–15.5)
WBC: 13.2 10*3/uL — ABNORMAL HIGH (ref 4.0–10.5)

## 2017-10-28 LAB — COMPREHENSIVE METABOLIC PANEL
ALBUMIN: 2.4 g/dL — AB (ref 3.5–5.0)
ALK PHOS: 105 U/L (ref 38–126)
ALT: 22 U/L (ref 0–44)
ANION GAP: 7 (ref 5–15)
AST: 32 U/L (ref 15–41)
BUN: 19 mg/dL (ref 8–23)
CHLORIDE: 106 mmol/L (ref 98–111)
CO2: 27 mmol/L (ref 22–32)
CREATININE: 0.95 mg/dL (ref 0.61–1.24)
Calcium: 9.1 mg/dL (ref 8.9–10.3)
GFR calc non Af Amer: >60 mL/min (ref >60)
GLUCOSE: 215 mg/dL — AB (ref 70–99)
Potassium: 4.3 mmol/L (ref 3.5–5.1)
SODIUM: 140 mmol/L (ref 135–145)
Total Bilirubin: 0.3 mg/dL (ref 0.3–1.2)
Total Protein: 6.4 g/dL — ABNORMAL LOW (ref 6.5–8.1)

## 2017-10-28 LAB — PROTIME-INR
INR: 1.21
Prothrombin Time: 15.1 seconds (ref 11.4–15.2)

## 2017-10-28 LAB — C DIFFICILE QUICK SCREEN W PCR REFLEX
C DIFFICILE (CDIFF) INTERP: NOT DETECTED
C Diff antigen: NEGATIVE
C Diff toxin: NEGATIVE

## 2017-10-28 LAB — MAGNESIUM: MAGNESIUM: 2 mg/dL (ref 1.7–2.4)

## 2017-10-28 LAB — VALPROIC ACID LEVEL: VALPROIC ACID LVL: 29 ug/mL — AB (ref 50.0–100.0)

## 2017-10-28 NOTE — Consult Note (Signed)
Pulmonary Critical Care Medicine [facility] [place]  PULMONARY SERVICE  Date of Service: 10/28/2017  PULMONARY CRITICAL CARE Billy Liu  WUJ:[id number]  DOB: [birth year]   DOA: 10/27/2017  Referring Physician: Carron Curie, MD  HPI: Billy Liu is a 84 y.o. male seen for follow up of Acute on Chronic Respiratory Failure.  Patient has multiple medical problems including dementia hypertension coronary artery disease COPD chronic kidney disease presented to the hospital in cardiac arrest.  Patient was apparently found unresponsive unknown downtime.  The patient was found in the nursing home in his bathroom apparently.  When he was initially evaluated was found to have a pulseless electrical activity CPR was started.  When EMS consult the patient patient did have a pulse and he was noted to have bradycardia on the EKG.  Patient was started on bag mask valve ventilation and eventually was intubated.  Hospital course as follows.  For his respiratory failure he continues to be on full support and he was also noted to have a pneumonia was treated with meropenem and vancomycin for healthcare associated disease.  Patient does have a history of congestive heart failure and also was diuresed aggressively.  Also he did take a bump as far as his creatinine was concerned which is trended down back towards normal for him.  At this time he is awake comfortable does not appear to be in any distress remains on the ventilator and full support  Review of Systems:  ROS performed and is unremarkable other than noted above.  Past Medical History:  Diagnosis Date  . Anxiety   . Cellulitis 10/17/2014   left lower leg  . Chronic diastolic CHF (congestive heart failure) (HCC)   . CKD (chronic kidney disease), stage III (HCC)   . COPD (chronic obstructive pulmonary disease) (HCC)   . Coronary artery disease   . Dementia (HCC)    Alzheimer's   . Depression   . Diabetes mellitus without  complication (HCC)   . GERD (gastroesophageal reflux disease)   . Hyperlipemia   . Hypertension   . Obesity   . Renal disorder     Past Surgical History:  Procedure Laterality Date  . COLONOSCOPY N/A 06/20/2017   Procedure: COLONOSCOPY;  Surgeon: Toney Reil, MD;  Location: [facility] ENDOSCOPY;  Service: Gastroenterology;  Laterality: N/A;  . ESOPHAGOGASTRODUODENOSCOPY N/A 06/20/2017   Procedure: ESOPHAGOGASTRODUODENOSCOPY (EGD);  Surgeon: Toney Reil, MD;  Location: [facility] ENDOSCOPY;  Service: Gastroenterology;  Laterality: N/A;  . LOWER EXTREMITY ANGIOGRAPHY Left 02/11/2017   Procedure: LOWER EXTREMITY ANGIOGRAPHY;  Surgeon: Renford Dills, MD;  Location: [facility] INVASIVE CV LAB;  Service: Cardiovascular;  Laterality: Left;  . LOWER EXTREMITY ANGIOGRAPHY Left 03/11/2017   Procedure: LOWER EXTREMITY ANGIOGRAPHY;  Surgeon: Renford Dills, MD;  Location: [facility] INVASIVE CV LAB;  Service: Cardiovascular;  Laterality: Left;  . LOWER EXTREMITY INTERVENTION  03/11/2017   Procedure: LOWER EXTREMITY INTERVENTION;  Surgeon: Renford Dills, MD;  Location: [facility] INVASIVE CV LAB;  Service: Cardiovascular;;  . PEG PLACEMENT N/A 10/23/2017   Procedure: PERCUTANEOUS ENDOSCOPIC GASTROSTOMY (PEG) PLACEMENT;  Surgeon: Midge Minium, MD;  Location: [facility] ENDOSCOPY;  Service: Endoscopy;  Laterality: N/A;  . PERIPHERAL VASCULAR ATHERECTOMY Left 02/18/2017   Procedure: PERIPHERAL VASCULAR ATHERECTOMY;  Surgeon: Renford Dills, MD;  Location: [facility] INVASIVE CV LAB;  Service: Cardiovascular;  Laterality: Left;  . TRACHEOSTOMY TUBE PLACEMENT N/A 10/20/2017   Procedure: TRACHEOSTOMY;  Surgeon: Bud Face, MD;  Location: [facility] ORS;  Service: ENT;  Laterality: N/A;    Social History:    reports that he has never smoked. He has quit using smokeless tobacco.  His smokeless tobacco use included chew. He reports that he does not drink alcohol or use drugs.  Family History: Non-Contributory to the  present illness  Allergies  Allergen Reactions  . Penicillins Other (See Comments)    Per MAR Has patient had a PCN reaction causing immediate rash, facial/tongue/throat swelling, SOB or lightheadedness with hypotension: Unknown Has patient had a PCN reaction causing severe rash involving mucus membranes or skin necrosis: Unknown Has patient had a PCN reaction that required hospitalization: Unknown Has patient had a PCN reaction occurring within the last 10 years: Unknown If all of the above answers are "NO", then may proceed with Cephalosporin use.     Medications: Reviewed on Rounds  Physical Exam:  Vitals: Temperature 100.3 pulse 104 respiratory rate 19 blood pressure 175/65 saturation 95%  Ventilator Settings mode of ventilation assist control FiO2 20% tidal volume 500 PEEP 5  . General: Comfortable at this time . Eyes: Grossly normal lids, irises & conjunctiva . ENT: grossly tongue is normal . Neck: no obvious mass . Cardiovascular: S1-S2 normal no gallop rub . Respiratory: Coarse rhonchi noted bilaterally . Abdomen: Soft nontender . Skin: no rash seen on limited exam . Musculoskeletal: not rigid . Psychiatric:unable to assess . Neurologic: no seizure no involuntary movements         Labs on Admission:  Basic Metabolic Panel: Recent Labs  Lab 10/22/17 0334 10/23/17 1034 10/24/17 0832 10/25/17 0434 10/27/17 0540 10/28/17 0612  NA 140 141 139 141 141 140  K 3.4* 3.7 4.0 3.8 3.7 4.3  CL 103 103 101 103 104 106  CO2 31 30 30 29 30 27   GLUCOSE 191* 125* 193* 172* 149* 215*  BUN 38* 39* 30* 28* 27* 19  CREATININE 0.74 0.70 0.87 0.68 0.95 0.95  CALCIUM 9.1 9.4 9.3 9.1 8.5* 9.1  MG 2.1 1.8 2.1 2.0 2.1 2.0  PHOS 3.2 3.5  --  3.5  --   --     Recent Labs  Lab 10/22/17 0328 10/23/17 0435 10/24/17 0439 10/27/17 2040  PHART 7.49* 7.49* 7.48* 7.362  PCO2ART 44 46 39 53.8*  PO2ART 97 66* 72* 75.0*  HCO3 33.5* 35.1* 29.0* 29.5*  O2SAT 98.1 94.3 95.4 94.1     Liver Function Tests: Recent Labs  Lab 10/22/17 0334 10/23/17 1034 10/24/17 0832 10/25/17 0434 10/28/17 0612  AST 28 27 24 24  32  ALT 20 19 20 19 22   ALKPHOS 88 86 94 113 105  BILITOT 0.2* 0.3 0.4 0.4 0.3  PROT 6.8 6.8 7.9 7.3 6.4*  ALBUMIN 2.6* 2.7* 3.0* 2.7* 2.4*   No results for input(s): LIPASE, AMYLASE in the last 168 hours. No results for input(s): AMMONIA in the last 168 hours.  CBC: Recent Labs  Lab 10/22/17 0334 10/23/17 1034 10/24/17 0832 10/25/17 0434 10/28/17 0612  WBC 6.7 7.5 7.9 9.7 13.2*  NEUTROABS 4.5 5.7 5.8 7.8* 10.8*  HGB 8.8* 8.6* 9.2* 8.8* 7.7*  HCT 29.7* 28.6* 30.4* 29.3* 26.9*  MCV 84.4 83.6 83.1 83.0 83.0  PLT 398 378 438* 447* 542*    Cardiac Enzymes: No results for input(s): CKTOTAL, CKMB, CKMBINDEX, TROPONINI in the last 168 hours.  BNP (last 3 results) No results for input(s): BNP in the last 8760 hours.  ProBNP (last 3 results) No results for input(s): PROBNP in the last 8760 hours.   Radiological Exams on  Admission: Dg Abd 1 View  Result Date: 10/24/2017 CLINICAL DATA:  Abdominal distension. EXAM: ABDOMEN - 1 VIEW COMPARISON:  Radiograph of October 13, 2017. FINDINGS: The bowel gas pattern is normal. Gastrostomy tube is seen projected over gastric air bubble. No radio-opaque calculi or other significant radiographic abnormality are seen. IMPRESSION: Gastrostomy tube in grossly good position. No evidence of bowel obstruction or ileus. Electronically Signed   By: Lupita Raider, M.D.   On: 10/24/2017 21:22   Dg Abdomen Peg Tube Location  Result Date: 10/27/2017 CLINICAL DATA:  Peg tube placement EXAM: ABDOMEN - 1 VIEW COMPARISON:  10/24/2017 FINDINGS: Gastrostomy tube projects over the body of the stomach. Small amount of radiopaque contrast within the gastric fundus. No obvious extravasation. Mild diffuse increased bowel gas without obstructive pattern. IMPRESSION: Gastrostomy tube projects over the body of the stomach. Small  amount of contrast material in the fundus of the stomach. No obvious extravasation. Electronically Signed   By: Jasmine Pang M.D.   On: 10/27/2017 21:18   Dg Chest Port 1 View  Result Date: 10/27/2017 CLINICAL DATA:  Respiratory failure EXAM: PORTABLE CHEST 1 VIEW COMPARISON:  10/27/2017, 10/24/2017 FINDINGS: Right upper extremity catheter tip overlies the SVC. Tracheostomy tube tip about 6 cm superior to the carina. Mild cardiomegaly with vascular congestion and mild diffuse interstitial and hazy opacity likely reflecting mild edema. Patchy more confluent opacities at the bases. This appears slightly worse in the right base. No pneumothorax. IMPRESSION: 1. Increasing bibasilar atelectasis or infiltrates. 2. Cardiomegaly with vascular congestion and mild diffuse background pulmonary edema. Electronically Signed   By: Jasmine Pang M.D.   On: 10/27/2017 21:19   Dg Chest Port 1 View  Result Date: 10/27/2017 CLINICAL DATA:  Recent aspiration EXAM: PORTABLE CHEST 1 VIEW COMPARISON:  10/24/2017 FINDINGS: Tracheostomy tube and right-sided PICC line are again seen and stable. Cardiac shadow remains enlarged. Some increasing basilar atelectasis is noted bilaterally. No pneumothorax or sizable effusion is noted. No other focal abnormality is noted. IMPRESSION: Increasing bibasilar atelectasis. Electronically Signed   By: Alcide Clever M.D.   On: 10/27/2017 13:12    Assessment/Plan Principal Problem:   Acute on chronic respiratory failure with hypoxia (HCC) Active Problems:   COPD (chronic obstructive pulmonary disease) (HCC)   Cardiac arrest (HCC)   Healthcare-associated pneumonia   Acute kidney injury superimposed on chronic kidney disease (HCC)   Dementia without behavioral disturbance (HCC)   1. Acute on chronic respiratory failure with hypoxia patient currently has low-grade fever noted and is on the ventilator and full support.  The last chest x-ray that was done showed an increasing bibasilar  atelectasis infiltrate spoke with the primary care team started on antibiotic therapy.  Patient is going to be continued and assist control right now on 28% FiO2 with a PEEP of 5 we will check the RSB I and then wean if patient is able to tolerate. 2. Healthcare associated lobar pneumonia antibiotics to be reassessed.  Follow-up on x-rays.  Patient had previously been treated with meropenem and vancomycin and the x-rays had actually shown improvement and now there is a decline in the status. 3. Status post cardiac arrest stable rhythm at this time patient does have a history of diastolic dysfunction. 4. Acute kidney injury with chronic kidney disease stage III follow-up on labs patient is stable right now. 5. Dementia grossly unchanged we will continue with present therapy  I have personally seen and evaluated the patient, evaluated laboratory and imaging results, formulated the  assessment and plan and placed orders. The Patient requires high complexity decision making for assessment and support.  Case was discussed on Rounds with the Respiratory Therapy Staff Time Spent  Yevonne Pax, MD [facility] Pulmonary Critical Care Medicine Sleep Medicine

## 2017-10-29 DIAGNOSIS — J449 Chronic obstructive pulmonary disease, unspecified: Secondary | ICD-10-CM

## 2017-10-29 NOTE — Progress Notes (Signed)
Pulmonary Critical Care Medicine [facility] [place]   PULMONARY CRITICAL CARE SERVICE  PROGRESS NOTE  Date of Service: 10/29/2017  Billy Liu  ZOX:[id number]  DOB: 1932-06-15   DOA: 10/27/2017  Referring Physician: Carron Curie, MD  HPI: Billy Liu is a 84 y.o. male seen for follow up of Acute on Chronic Respiratory Failure.  He is doing a little bit better has been weaning on pressure support did 2 hours yesterday on pressure support so we should be able to do 4 hours today  Medications: Reviewed on Rounds  Physical Exam:  Vitals: Temperature 98.2 pulse 116 respiratory rate 20 blood pressure 180/100 saturations 100%  Ventilator Settings mode of ventilation pressure support FiO2 40% tidal volume 401 pressure support 12 PEEP 5  . General: Comfortable at this time . Eyes: Grossly normal lids, irises & conjunctiva . ENT: grossly tongue is normal . Neck: no obvious mass . Cardiovascular: S1 S2 normal no gallop . Respiratory: No rhonchi or rales are noted . Abdomen: soft . Skin: no rash seen on limited exam . Musculoskeletal: not rigid . Psychiatric:unable to assess . Neurologic: no seizure no involuntary movements         Lab Data:   Basic Metabolic Panel: Recent Labs  Lab 10/23/17 1034 10/24/17 0832 10/25/17 0434 10/27/17 0540 10/28/17 0612  NA 141 139 141 141 140  K 3.7 4.0 3.8 3.7 4.3  CL 103 101 103 104 106  CO2 30 30 29 30 27   GLUCOSE 125* 193* 172* 149* 215*  BUN 39* 30* 28* 27* 19  CREATININE 0.70 0.87 0.68 0.95 0.95  CALCIUM 9.4 9.3 9.1 8.5* 9.1  MG 1.8 2.1 2.0 2.1 2.0  PHOS 3.5  --  3.5  --   --     ABG: Recent Labs  Lab 10/23/17 0435 10/24/17 0439 10/27/17 2040  PHART 7.49* 7.48* 7.362  PCO2ART 46 39 53.8*  PO2ART 66* 72* 75.0*  HCO3 35.1* 29.0* 29.5*  O2SAT 94.3 95.4 94.1    Liver Function Tests: Recent Labs  Lab 10/23/17 1034 10/24/17 0832 10/25/17 0434 10/28/17 0612  AST 27 24 24  32  ALT 19 20 19 22    ALKPHOS 86 94 113 105  BILITOT 0.3 0.4 0.4 0.3  PROT 6.8 7.9 7.3 6.4*  ALBUMIN 2.7* 3.0* 2.7* 2.4*   No results for input(s): LIPASE, AMYLASE in the last 168 hours. No results for input(s): AMMONIA in the last 168 hours.  CBC: Recent Labs  Lab 10/23/17 1034 10/24/17 0832 10/25/17 0434 10/28/17 0612  WBC 7.5 7.9 9.7 13.2*  NEUTROABS 5.7 5.8 7.8* 10.8*  HGB 8.6* 9.2* 8.8* 7.7*  HCT 28.6* 30.4* 29.3* 26.9*  MCV 83.6 83.1 83.0 83.0  PLT 378 438* 447* 542*    Cardiac Enzymes: No results for input(s): CKTOTAL, CKMB, CKMBINDEX, TROPONINI in the last 168 hours.  BNP (last 3 results) No results for input(s): BNP in the last 8760 hours.  ProBNP (last 3 results) No results for input(s): PROBNP in the last 8760 hours.  Radiological Exams: Dg Abdomen Peg Tube Location  Result Date: 10/27/2017 CLINICAL DATA:  Peg tube placement EXAM: ABDOMEN - 1 VIEW COMPARISON:  10/24/2017 FINDINGS: Gastrostomy tube projects over the body of the stomach. Small amount of radiopaque contrast within the gastric fundus. No obvious extravasation. Mild diffuse increased bowel gas without obstructive pattern. IMPRESSION: Gastrostomy tube projects over the body of the stomach. Small amount of contrast material in the fundus of the stomach. No obvious extravasation. Electronically Signed  By: Jasmine Pang M.D.   On: 10/27/2017 21:18   Dg Chest Port 1 View  Result Date: 10/27/2017 CLINICAL DATA:  Respiratory failure EXAM: PORTABLE CHEST 1 VIEW COMPARISON:  10/27/2017, 10/24/2017 FINDINGS: Right upper extremity catheter tip overlies the SVC. Tracheostomy tube tip about 6 cm superior to the carina. Mild cardiomegaly with vascular congestion and mild diffuse interstitial and hazy opacity likely reflecting mild edema. Patchy more confluent opacities at the bases. This appears slightly worse in the right base. No pneumothorax. IMPRESSION: 1. Increasing bibasilar atelectasis or infiltrates. 2. Cardiomegaly with  vascular congestion and mild diffuse background pulmonary edema. Electronically Signed   By: Jasmine Pang M.D.   On: 10/27/2017 21:19   Dg Chest Port 1 View  Result Date: 10/27/2017 CLINICAL DATA:  Recent aspiration EXAM: PORTABLE CHEST 1 VIEW COMPARISON:  10/24/2017 FINDINGS: Tracheostomy tube and right-sided PICC line are again seen and stable. Cardiac shadow remains enlarged. Some increasing basilar atelectasis is noted bilaterally. No pneumothorax or sizable effusion is noted. No other focal abnormality is noted. IMPRESSION: Increasing bibasilar atelectasis. Electronically Signed   By: Alcide Clever M.D.   On: 10/27/2017 13:12    Assessment/Plan Principal Problem:   Acute on chronic respiratory failure with hypoxia (HCC) Active Problems:   COPD (chronic obstructive pulmonary disease) (HCC)   Cardiac arrest (HCC)   Healthcare-associated pneumonia   Acute kidney injury superimposed on chronic kidney disease (HCC)   Dementia without behavioral disturbance (HCC)   1. Acute on chronic respiratory failure with hypoxia we will continue to wean on pressure support mode goal is for 4 hours 2. COPD we will continue with present therapy 3. Cardiac arrest rhythm is been stable 4. Healthcare associated pneumonia treated has some basilar atelectasis noted 5. Acute renal injury labs are stabilized we will continue to monitor 6. Dementia at baseline   I have personally seen and evaluated the patient, evaluated laboratory and imaging results, formulated the assessment and plan and placed orders. The Patient requires high complexity decision making for assessment and support.  Case was discussed on Rounds with the Respiratory Therapy Staff  Yevonne Pax, MD [facility] Pulmonary Critical Care Medicine Sleep Medicine

## 2017-10-30 ENCOUNTER — Ambulatory Visit (INDEPENDENT_AMBULATORY_CARE_PROVIDER_SITE_OTHER): Payer: Medicare Other | Admitting: Vascular Surgery

## 2017-10-30 LAB — URINALYSIS, ROUTINE W REFLEX MICROSCOPIC
BILIRUBIN URINE: NEGATIVE
GLUCOSE, UA: NEGATIVE mg/dL
KETONES UR: NEGATIVE mg/dL
Nitrite: NEGATIVE
PROTEIN: 100 mg/dL — AB
Specific Gravity, Urine: 1.011 (ref 1.005–1.030)
pH: 7 (ref 5.0–8.0)

## 2017-10-30 NOTE — Progress Notes (Signed)
Pulmonary Critical Care Medicine [facility] [place]   PULMONARY CRITICAL CARE SERVICE  PROGRESS NOTE  Date of Service: 10/30/2017  Billy Liu  VWU:[id number]  DOB: Apr 30, 1932   DOA: 10/27/2017  Referring Physician: Carron Curie, MD  HPI: Billy Liu is a 84 y.o. male seen for follow up of Acute on Chronic Respiratory Failure.  Currently on 35% FiO2 on pressure support wean  Medications: Reviewed on Rounds  Physical Exam:  Vitals: Temperature 98.0 pulse 98 respiratory rate 17 blood pressure 134/41 saturation 97%  Ventilator Settings mode ventilation pressure support FiO2 35% per support 12 PEEP 5  . General: Comfortable at this time . Eyes: Grossly normal lids, irises & conjunctiva . ENT: grossly tongue is normal . Neck: no obvious mass . Cardiovascular: S1 S2 normal no gallop . Respiratory: Coarse breath sounds no rhonchi . Abdomen: soft . Skin: no rash seen on limited exam . Musculoskeletal: not rigid . Psychiatric:unable to assess . Neurologic: no seizure no involuntary movements         Lab Data:   Basic Metabolic Panel: Recent Labs  Lab 10/24/17 0832 10/25/17 0434 10/27/17 0540 10/28/17 0612  NA 139 141 141 140  K 4.0 3.8 3.7 4.3  CL 101 103 104 106  CO2 30 29 30 27   GLUCOSE 193* 172* 149* 215*  BUN 30* 28* 27* 19  CREATININE 0.87 0.68 0.95 0.95  CALCIUM 9.3 9.1 8.5* 9.1  MG 2.1 2.0 2.1 2.0  PHOS  --  3.5  --   --     ABG: Recent Labs  Lab 10/24/17 0439 10/27/17 2040  PHART 7.48* 7.362  PCO2ART 39 53.8*  PO2ART 72* 75.0*  HCO3 29.0* 29.5*  O2SAT 95.4 94.1    Liver Function Tests: Recent Labs  Lab 10/24/17 0832 10/25/17 0434 10/28/17 0612  AST 24 24 32  ALT 20 19 22   ALKPHOS 94 113 105  BILITOT 0.4 0.4 0.3  PROT 7.9 7.3 6.4*  ALBUMIN 3.0* 2.7* 2.4*   No results for input(s): LIPASE, AMYLASE in the last 168 hours. No results for input(s): AMMONIA in the last 168 hours.  CBC: Recent Labs  Lab 10/24/17 0832  10/25/17 0434 10/28/17 0612  WBC 7.9 9.7 13.2*  NEUTROABS 5.8 7.8* 10.8*  HGB 9.2* 8.8* 7.7*  HCT 30.4* 29.3* 26.9*  MCV 83.1 83.0 83.0  PLT 438* 447* 542*    Cardiac Enzymes: No results for input(s): CKTOTAL, CKMB, CKMBINDEX, TROPONINI in the last 168 hours.  BNP (last 3 results) No results for input(s): BNP in the last 8760 hours.  ProBNP (last 3 results) No results for input(s): PROBNP in the last 8760 hours.  Radiological Exams: No results found.  Assessment/Plan Principal Problem:   Acute on chronic respiratory failure with hypoxia (HCC) Active Problems:   COPD (chronic obstructive pulmonary disease) (HCC)   Cardiac arrest (HCC)   Healthcare-associated pneumonia   Acute kidney injury superimposed on chronic kidney disease (HCC)   Dementia without behavioral disturbance (HCC)   1. Acute on chronic respiratory failure with hypoxia patient is weaning on pressure support mode the goal is for 8 hours.  Continue to wean on pressure support as ordered.  Continue with pulmonary toilet. 2. COPD severe disease we will continue present management 3. Cardiac arrest stable at this time 4. Healthcare associated pneumonia treated we will follow along 5. Dementia without behavioral disturbance stable we will continue present therapy 6. Acute on chronic kidney disease follow-up on labs   I have personally seen  and evaluated the patient, evaluated laboratory and imaging results, formulated the assessment and plan and placed orders. The Patient requires high complexity decision making for assessment and support.  Case was discussed on Rounds with the Respiratory Therapy Staff  Allyne Gee, MD [facility] Pulmonary Critical Care Medicine Sleep Medicine

## 2017-10-31 LAB — CULTURE, RESPIRATORY

## 2017-10-31 LAB — URINE CULTURE

## 2017-10-31 LAB — CULTURE, RESPIRATORY W GRAM STAIN

## 2017-10-31 NOTE — Progress Notes (Signed)
Pulmonary Critical Care Medicine [facility] [place]   PULMONARY CRITICAL CARE SERVICE  PROGRESS NOTE  Date of Service: 10/31/2017  Billy Liu  ZOX:[id number]  DOB: 10-Jul-1932   DOA: 10/27/2017  Referring Physician: Carron Curie, MD  HPI: Billy Liu is a 84 y.o. male seen for follow up of Acute on Chronic Respiratory Failure.  Patient is on pressure support patient is doing well will be weaning out of the goal of up to 8 hours to 12 hours  Medications: Reviewed on Rounds  Physical Exam:  Vitals: Temperature is 97.8 pulse 88 respiratory rate 17 blood pressure 166/74 saturations in 796%  Ventilator Settings mode of ventilation pressure support FiO2 28% pressure support 12 PEEP 5 volumes 383  . General: Comfortable at this time . Eyes: Grossly normal lids, irises & conjunctiva . ENT: grossly tongue is normal . Neck: no obvious mass . Cardiovascular: S1 S2 normal no gallop . Respiratory: Few scattered rhonchi are noted . Abdomen: soft . Skin: no rash seen on limited exam . Musculoskeletal: not rigid . Psychiatric:unable to assess . Neurologic: no seizure no involuntary movements         Lab Data:   Basic Metabolic Panel: Recent Labs  Lab 10/25/17 0434 10/27/17 0540 10/28/17 0612  NA 141 141 140  K 3.8 3.7 4.3  CL 103 104 106  CO2 29 30 27   GLUCOSE 172* 149* 215*  BUN 28* 27* 19  CREATININE 0.68 0.95 0.95  CALCIUM 9.1 8.5* 9.1  MG 2.0 2.1 2.0  PHOS 3.5  --   --     ABG: Recent Labs  Lab 10/27/17 2040  PHART 7.362  PCO2ART 53.8*  PO2ART 75.0*  HCO3 29.5*  O2SAT 94.1    Liver Function Tests: Recent Labs  Lab 10/25/17 0434 10/28/17 0612  AST 24 32  ALT 19 22  ALKPHOS 113 105  BILITOT 0.4 0.3  PROT 7.3 6.4*  ALBUMIN 2.7* 2.4*   No results for input(s): LIPASE, AMYLASE in the last 168 hours. No results for input(s): AMMONIA in the last 168 hours.  CBC: Recent Labs  Lab 10/25/17 0434 10/28/17 0612  WBC 9.7 13.2*   NEUTROABS 7.8* 10.8*  HGB 8.8* 7.7*  HCT 29.3* 26.9*  MCV 83.0 83.0  PLT 447* 542*    Cardiac Enzymes: No results for input(s): CKTOTAL, CKMB, CKMBINDEX, TROPONINI in the last 168 hours.  BNP (last 3 results) No results for input(s): BNP in the last 8760 hours.  ProBNP (last 3 results) No results for input(s): PROBNP in the last 8760 hours.  Radiological Exams: No results found.  Assessment/Plan Principal Problem:   Acute on chronic respiratory failure with hypoxia (HCC) Active Problems:   COPD (chronic obstructive pulmonary disease) (HCC)   Cardiac arrest (HCC)   Healthcare-associated pneumonia   Acute kidney injury superimposed on chronic kidney disease (HCC)   Dementia without behavioral disturbance (HCC)   1. Acute on chronic respiratory failure with hypoxia we will continue with weaning on pressure support mode continue pulmonary toilet secretion management overall prognosis guarded. 2. COPD severe disease stable at this time we will continue to follow along 3. Cardiac arrest rhythm has been stable we will continue present management 4. Healthcare associated pneumonia treated we will follow 5. Acute renal failure labs are stabilized 6. Dementia at baseline   I have personally seen and evaluated the patient, evaluated laboratory and imaging results, formulated the assessment and plan and placed orders. The Patient requires high complexity decision making for assessment  and support.  Case was discussed on Rounds with the Respiratory Therapy Staff  Allyne Gee, MD [facility] Pulmonary Critical Care Medicine Sleep Medicine

## 2017-11-01 ENCOUNTER — Other Ambulatory Visit (HOSPITAL_COMMUNITY): Payer: Medicare Other

## 2017-11-01 LAB — BASIC METABOLIC PANEL
Anion gap: 10 (ref 5–15)
BUN: 17 mg/dL (ref 8–23)
CALCIUM: 9.1 mg/dL (ref 8.9–10.3)
CHLORIDE: 97 mmol/L — AB (ref 98–111)
CO2: 28 mmol/L (ref 22–32)
CREATININE: 0.93 mg/dL (ref 0.61–1.24)
GFR calc Af Amer: >60 mL/min (ref >60)
GFR calc non Af Amer: >60 mL/min (ref >60)
GLUCOSE: 164 mg/dL — AB (ref 70–99)
Potassium: 3.8 mmol/L (ref 3.5–5.1)
Sodium: 135 mmol/L (ref 135–145)

## 2017-11-01 LAB — CBC
HEMATOCRIT: 28.6 % — AB (ref 39.0–52.0)
Hemoglobin: 8.6 g/dL — ABNORMAL LOW (ref 13.0–17.0)
MCH: 23.8 pg — AB (ref 26.0–34.0)
MCHC: 30.1 g/dL (ref 30.0–36.0)
MCV: 79 fL — AB (ref 80.0–100.0)
Platelets: 506 10*3/uL — ABNORMAL HIGH (ref 150–400)
RBC: 3.62 MIL/uL — ABNORMAL LOW (ref 4.22–5.81)
RDW: 17.3 % — ABNORMAL HIGH (ref 11.5–15.5)
WBC: 10.2 10*3/uL (ref 4.0–10.5)
nRBC: 0 % (ref 0.0–0.2)

## 2017-11-01 LAB — MAGNESIUM: Magnesium: 1.9 mg/dL (ref 1.7–2.4)

## 2017-11-01 NOTE — Progress Notes (Signed)
Pulmonary Critical Care Medicine [facility] [place]   PULMONARY CRITICAL CARE SERVICE  PROGRESS NOTE  Date of Service: 11/01/2017  Billy Liu  WUJ:[id number]  DOB: 24-Feb-1932   DOA: 10/27/2017  Referring Physician: Carron Curie, MD  HPI: Billy Liu is a 84 y.o. male seen for follow up of Acute on Chronic Respiratory Failure.  Currently is on pressure support mode comfortable without distress at this time.  Medications: Reviewed on Rounds  Physical Exam:  Vitals: Temperature 97.5 pulse 87 respiratory rate 21 blood pressure 146/98 saturations 97%  Ventilator Settings mode of ventilation pressure support FiO2 is 28% tidal volume 547 per support 12 PEEP 5  . General: Comfortable at this time . Eyes: Grossly normal lids, irises & conjunctiva . ENT: grossly tongue is normal . Neck: no obvious mass . Cardiovascular: S1 S2 normal no gallop . Respiratory: Coarse breath sounds with some rhonchi noted . Abdomen: soft . Skin: no rash seen on limited exam . Musculoskeletal: not rigid . Psychiatric:unable to assess . Neurologic: no seizure no involuntary movements         Lab Data:   Basic Metabolic Panel: Recent Labs  Lab 10/27/17 0540 10/28/17 0612 11/01/17 0604  NA 141 140 135  K 3.7 4.3 3.8  CL 104 106 97*  CO2 30 27 28   GLUCOSE 149* 215* 164*  BUN 27* 19 17  CREATININE 0.95 0.95 0.93  CALCIUM 8.5* 9.1 9.1  MG 2.1 2.0 1.9    ABG: Recent Labs  Lab 10/27/17 2040  PHART 7.362  PCO2ART 53.8*  PO2ART 75.0*  HCO3 29.5*  O2SAT 94.1    Liver Function Tests: Recent Labs  Lab 10/28/17 0612  AST 32  ALT 22  ALKPHOS 105  BILITOT 0.3  PROT 6.4*  ALBUMIN 2.4*   No results for input(s): LIPASE, AMYLASE in the last 168 hours. No results for input(s): AMMONIA in the last 168 hours.  CBC: Recent Labs  Lab 10/28/17 0612 11/01/17 0604  WBC 13.2* 10.2  NEUTROABS 10.8*  --   HGB 7.7* 8.6*  HCT 26.9* 28.6*  MCV 83.0 79.0*  PLT 542* 506*     Cardiac Enzymes: No results for input(s): CKTOTAL, CKMB, CKMBINDEX, TROPONINI in the last 168 hours.  BNP (last 3 results) No results for input(s): BNP in the last 8760 hours.  ProBNP (last 3 results) No results for input(s): PROBNP in the last 8760 hours.  Radiological Exams: Dg Abd Portable 1v  Result Date: 11/01/2017 CLINICAL DATA:  Abdominal distension. EXAM: PORTABLE ABDOMEN - 1 VIEW COMPARISON:  10/27/2017 FINDINGS: There is generalized increased bowel gas, without bowel distention to suggest obstruction. Gastrostomy tube appears stable, projecting over the mid stomach. No evidence of renal or ureteral stones. Soft tissues are unremarkable. IMPRESSION: 1. Generalized increase in bowel gas without evidence of bowel obstruction. 2. Stable position of the gastrostomy tube within the mid stomach. Electronically Signed   By: Amie Portland M.D.   On: 11/01/2017 11:59    Assessment/Plan Principal Problem:   Acute on chronic respiratory failure with hypoxia (HCC) Active Problems:   COPD (chronic obstructive pulmonary disease) (HCC)   Cardiac arrest (HCC)   Healthcare-associated pneumonia   Acute kidney injury superimposed on chronic kidney disease (HCC)   Dementia without behavioral disturbance (HCC)   1. Acute on chronic respiratory failure with hypoxia patient will be continued on weaning tolerating pressure support continue to advance. 2. COPD severe disease continue present therapy 3. Cardiac arrest rhythm is been stable 4. Healthcare  associated pneumonia treated we will continue to follow 5. Acute renal failure follow-up labs 6. Dementia at baseline   I have personally seen and evaluated the patient, evaluated laboratory and imaging results, formulated the assessment and plan and placed orders. The Patient requires high complexity decision making for assessment and support.  Case was discussed on Rounds with the Respiratory Therapy Staff  Yevonne Pax, MD  [facility] Pulmonary Critical Care Medicine Sleep Medicine

## 2017-11-02 ENCOUNTER — Other Ambulatory Visit (HOSPITAL_COMMUNITY): Payer: Medicare Other

## 2017-11-02 NOTE — Progress Notes (Signed)
Pulmonary Critical Care Medicine [facility] [place]   PULMONARY CRITICAL CARE SERVICE  PROGRESS NOTE  Date of Service: 11/02/2017  Billy Liu  ZOX:[id number]  DOB: 12-30-1932   DOA: 10/27/2017  Referring Physician: Carron Curie, MD  HPI: Billy Liu is a 84 y.o. male seen for follow up of Acute on Chronic Respiratory Failure.  Patient is on pressure support goal of 16 hours doing well  Medications: Reviewed on Rounds  Physical Exam:  Vitals: Temperature 98.5 pulse 80 respiratory rate 20 blood pressure 91/52 saturations 95%  Ventilator Settings mode of ventilation pressure support FiO2 28% tidal line 500 pressure support 12 PEEP 5  . General: Comfortable at this time . Eyes: Grossly normal lids, irises & conjunctiva . ENT: grossly tongue is normal . Neck: no obvious mass . Cardiovascular: S1 S2 normal no gallop . Respiratory: No rhonchi or rales are noted at this time . Abdomen: soft . Skin: no rash seen on limited exam . Musculoskeletal: not rigid . Psychiatric:unable to assess . Neurologic: no seizure no involuntary movements         Lab Data:   Basic Metabolic Panel: Recent Labs  Lab 10/27/17 0540 10/28/17 0612 11/01/17 0604  NA 141 140 135  K 3.7 4.3 3.8  CL 104 106 97*  CO2 30 27 28   GLUCOSE 149* 215* 164*  BUN 27* 19 17  CREATININE 0.95 0.95 0.93  CALCIUM 8.5* 9.1 9.1  MG 2.1 2.0 1.9    ABG: Recent Labs  Lab 10/27/17 2040  PHART 7.362  PCO2ART 53.8*  PO2ART 75.0*  HCO3 29.5*  O2SAT 94.1    Liver Function Tests: Recent Labs  Lab 10/28/17 0612  AST 32  ALT 22  ALKPHOS 105  BILITOT 0.3  PROT 6.4*  ALBUMIN 2.4*   No results for input(s): LIPASE, AMYLASE in the last 168 hours. No results for input(s): AMMONIA in the last 168 hours.  CBC: Recent Labs  Lab 10/28/17 0612 11/01/17 0604  WBC 13.2* 10.2  NEUTROABS 10.8*  --   HGB 7.7* 8.6*  HCT 26.9* 28.6*  MCV 83.0 79.0*  PLT 542* 506*    Cardiac  Enzymes: No results for input(s): CKTOTAL, CKMB, CKMBINDEX, TROPONINI in the last 168 hours.  BNP (last 3 results) No results for input(s): BNP in the last 8760 hours.  ProBNP (last 3 results) No results for input(s): PROBNP in the last 8760 hours.  Radiological Exams: Dg Abd Portable 1v  Result Date: 11/01/2017 CLINICAL DATA:  Abdominal distension. EXAM: PORTABLE ABDOMEN - 1 VIEW COMPARISON:  10/27/2017 FINDINGS: There is generalized increased bowel gas, without bowel distention to suggest obstruction. Gastrostomy tube appears stable, projecting over the mid stomach. No evidence of renal or ureteral stones. Soft tissues are unremarkable. IMPRESSION: 1. Generalized increase in bowel gas without evidence of bowel obstruction. 2. Stable position of the gastrostomy tube within the mid stomach. Electronically Signed   By: Amie Portland M.D.   On: 11/01/2017 11:59    Assessment/Plan Principal Problem:   Acute on chronic respiratory failure with hypoxia (HCC) Active Problems:   COPD (chronic obstructive pulmonary disease) (HCC)   Cardiac arrest (HCC)   Healthcare-associated pneumonia   Acute kidney injury superimposed on chronic kidney disease (HCC)   Dementia without behavioral disturbance (HCC)   1. Acute on chronic respiratory failure with hypoxia we will continue with pressure support wean continue pulmonary toilet supportive care goal is 16 hours 2. COPD advanced disease continue supportive care 3. Cardiac arrest rhythm is  stable 4. Healthcare associated pneumonia treated we will continue to monitor 5. Acute renal failure follow-up on labs 6. Dementia unchanged   I have personally seen and evaluated the patient, evaluated laboratory and imaging results, formulated the assessment and plan and placed orders. The Patient requires high complexity decision making for assessment and support.  Case was discussed on Rounds with the Respiratory Therapy Staff  Yevonne Pax, MD  [facility] Pulmonary Critical Care Medicine Sleep Medicine

## 2017-11-03 ENCOUNTER — Other Ambulatory Visit (HOSPITAL_COMMUNITY): Payer: Medicare Other

## 2017-11-03 LAB — VANCOMYCIN, TROUGH: VANCOMYCIN TR: 14 ug/mL — AB (ref 15–20)

## 2017-11-03 NOTE — Progress Notes (Signed)
Pulmonary Critical Care Medicine [facility] [place]   PULMONARY CRITICAL CARE SERVICE  PROGRESS NOTE  Date of Service: 11/03/2017  Billy Liu  NFA:[id number]  DOB: 1932/06/16   DOA: 10/27/2017  Referring Physician: Carron Curie, MD  HPI: Billy Liu is a 84 y.o. male seen for follow up of Acute on Chronic Respiratory Failure.  Patient is on T collar comfortable without distress.  Has been on 28% FiO2.  Was able to do 2 hours on the T collar.  Medications: Reviewed on Rounds  Physical Exam:  Vitals: Temperature 97.0 pulse 67 respiratory rate 20 blood pressure 160/87 saturation 96%  Ventilator Settings off the ventilator on T collar  . General: Comfortable at this time . Eyes: Grossly normal lids, irises & conjunctiva . ENT: grossly tongue is normal . Neck: no obvious mass . Cardiovascular: S1 S2 normal no gallop . Respiratory: No rhonchi or rales are noted at this time . Abdomen: soft . Skin: no rash seen on limited exam . Musculoskeletal: not rigid . Psychiatric:unable to assess . Neurologic: no seizure no involuntary movements         Lab Data:   Basic Metabolic Panel: Recent Labs  Lab 10/28/17 0612 11/01/17 0604  NA 140 135  K 4.3 3.8  CL 106 97*  CO2 27 28  GLUCOSE 215* 164*  BUN 19 17  CREATININE 0.95 0.93  CALCIUM 9.1 9.1  MG 2.0 1.9    ABG: Recent Labs  Lab 10/27/17 2040  PHART 7.362  PCO2ART 53.8*  PO2ART 75.0*  HCO3 29.5*  O2SAT 94.1    Liver Function Tests: Recent Labs  Lab 10/28/17 0612  AST 32  ALT 22  ALKPHOS 105  BILITOT 0.3  PROT 6.4*  ALBUMIN 2.4*   No results for input(s): LIPASE, AMYLASE in the last 168 hours. No results for input(s): AMMONIA in the last 168 hours.  CBC: Recent Labs  Lab 10/28/17 0612 11/01/17 0604  WBC 13.2* 10.2  NEUTROABS 10.8*  --   HGB 7.7* 8.6*  HCT 26.9* 28.6*  MCV 83.0 79.0*  PLT 542* 506*    Cardiac Enzymes: No results for input(s): CKTOTAL, CKMB, CKMBINDEX,  TROPONINI in the last 168 hours.  BNP (last 3 results) No results for input(s): BNP in the last 8760 hours.  ProBNP (last 3 results) No results for input(s): PROBNP in the last 8760 hours.  Radiological Exams: Dg Chest Port 1 View  Result Date: 11/03/2017 CLINICAL DATA:  CHF EXAM: PORTABLE CHEST 1 VIEW COMPARISON:  10/27/2017 FINDINGS: Right-sided PICC line is been removed. Tracheostomy tube is again seen in satisfactory position. Cardiac shadow remains enlarged. Lungs are well aerated bilaterally. No significant vascular congestion or interstitial edema is noted. No bony abnormality is seen. IMPRESSION: Resolution of previously seen vascular congestion. No focal infiltrate is noted. Electronically Signed   By: Alcide Clever M.D.   On: 11/03/2017 08:47    Assessment/Plan Principal Problem:   Acute on chronic respiratory failure with hypoxia (HCC) Active Problems:   COPD (chronic obstructive pulmonary disease) (HCC)   Cardiac arrest (HCC)   Healthcare-associated pneumonia   Acute kidney injury superimposed on chronic kidney disease (HCC)   Dementia without behavioral disturbance (HCC)   1. Acute on chronic respiratory failure with hypoxia patient currently will be weaned on T collar with going to continue to advance it as tolerated. 2. COPD severe disease continue with present management 3. Cardiac arrest rhythm is been stable 4. Healthcare associated pneumonia treated we will monitor 5.  Acute renal failure follow-up on labs 6. Dementia patient is at baseline   I have personally seen and evaluated the patient, evaluated laboratory and imaging results, formulated the assessment and plan and placed orders. The Patient requires high complexity decision making for assessment and support.  Case was discussed on Rounds with the Respiratory Therapy Staff  Yevonne Pax, MD [facility] Pulmonary Critical Care Medicine Sleep Medicine

## 2017-11-04 LAB — BASIC METABOLIC PANEL
Anion gap: 11 (ref 5–15)
BUN: 25 mg/dL — AB (ref 8–23)
CHLORIDE: 95 mmol/L — AB (ref 98–111)
CO2: 29 mmol/L (ref 22–32)
CREATININE: 1.21 mg/dL (ref 0.61–1.24)
Calcium: 8.9 mg/dL (ref 8.9–10.3)
GFR calc Af Amer: >60 mL/min (ref >60)
GFR calc non Af Amer: 54 mL/min — ABNORMAL LOW (ref >60)
GLUCOSE: 157 mg/dL — AB (ref 70–99)
Potassium: 4.6 mmol/L (ref 3.5–5.1)
Sodium: 135 mmol/L (ref 135–145)

## 2017-11-04 LAB — CBC
HEMATOCRIT: 27.8 % — AB (ref 39.0–52.0)
HEMOGLOBIN: 8.4 g/dL — AB (ref 13.0–17.0)
MCH: 24.1 pg — AB (ref 26.0–34.0)
MCHC: 30.2 g/dL (ref 30.0–36.0)
MCV: 79.9 fL — ABNORMAL LOW (ref 80.0–100.0)
Platelets: 362 10*3/uL (ref 150–400)
RBC: 3.48 MIL/uL — ABNORMAL LOW (ref 4.22–5.81)
RDW: 17.6 % — ABNORMAL HIGH (ref 11.5–15.5)
WBC: 6.5 10*3/uL (ref 4.0–10.5)
nRBC: 0 % (ref 0.0–0.2)

## 2017-11-04 LAB — PHOSPHORUS: PHOSPHORUS: 3.2 mg/dL (ref 2.5–4.6)

## 2017-11-04 LAB — MAGNESIUM: Magnesium: 2.3 mg/dL (ref 1.7–2.4)

## 2017-11-04 NOTE — Progress Notes (Signed)
Pulmonary Critical Care Medicine [facility] [place]   PULMONARY CRITICAL CARE SERVICE  PROGRESS NOTE  Date of Service: 11/04/2017  Billy Liu  ZOX:[id number]  DOB: February 01, 1932   DOA: 10/27/2017  Referring Physician: Carron Curie, MD  HPI: Billy Liu is a 84 y.o. male seen for follow up of Acute on Chronic Respiratory Failure.  Afebrile right now patient is on 4-hour T collar goal  Medications: Reviewed on Rounds  Physical Exam:  Vitals: Temperature 98.0 pulse 80 respiratory rate 20 blood pressure 111/60 saturations 97%  Ventilator Settings patient is on T collar right now on 28% FiO2  . General: Comfortable at this time . Eyes: Grossly normal lids, irises & conjunctiva . ENT: grossly tongue is normal . Neck: no obvious mass . Cardiovascular: S1 S2 normal no gallop . Respiratory: Scattered rhonchi no rales are noted at this time. . Abdomen: soft . Skin: no rash seen on limited exam . Musculoskeletal: not rigid . Psychiatric:unable to assess . Neurologic: no seizure no involuntary movements         Lab Data:   Basic Metabolic Panel: Recent Labs  Lab 11/01/17 0604 11/04/17 0525  NA 135 135  K 3.8 4.6  CL 97* 95*  CO2 28 29  GLUCOSE 164* 157*  BUN 17 25*  CREATININE 0.93 1.21  CALCIUM 9.1 8.9  MG 1.9 2.3  PHOS  --  3.2    ABG: No results for input(s): PHART, PCO2ART, PO2ART, HCO3, O2SAT in the last 168 hours.  Liver Function Tests: No results for input(s): AST, ALT, ALKPHOS, BILITOT, PROT, ALBUMIN in the last 168 hours. No results for input(s): LIPASE, AMYLASE in the last 168 hours. No results for input(s): AMMONIA in the last 168 hours.  CBC: Recent Labs  Lab 11/01/17 0604 11/04/17 0525  WBC 10.2 6.5  HGB 8.6* 8.4*  HCT 28.6* 27.8*  MCV 79.0* 79.9*  PLT 506* 362    Cardiac Enzymes: No results for input(s): CKTOTAL, CKMB, CKMBINDEX, TROPONINI in the last 168 hours.  BNP (last 3 results) No results for input(s): BNP in  the last 8760 hours.  ProBNP (last 3 results) No results for input(s): PROBNP in the last 8760 hours.  Radiological Exams: Dg Chest Port 1 View  Result Date: 11/03/2017 CLINICAL DATA:  CHF EXAM: PORTABLE CHEST 1 VIEW COMPARISON:  10/27/2017 FINDINGS: Right-sided PICC line is been removed. Tracheostomy tube is again seen in satisfactory position. Cardiac shadow remains enlarged. Lungs are well aerated bilaterally. No significant vascular congestion or interstitial edema is noted. No bony abnormality is seen. IMPRESSION: Resolution of previously seen vascular congestion. No focal infiltrate is noted. Electronically Signed   By: Alcide Clever M.D.   On: 11/03/2017 08:47    Assessment/Plan Principal Problem:   Acute on chronic respiratory failure with hypoxia (HCC) Active Problems:   COPD (chronic obstructive pulmonary disease) (HCC)   Cardiac arrest (HCC)   Healthcare-associated pneumonia   Acute kidney injury superimposed on chronic kidney disease (HCC)   Dementia without behavioral disturbance (HCC)   1. Acute on chronic respiratory failure with hypoxia we will continue with T collar continue secretion management pulmonary toilet goal is 4 hours 2. COPD at baseline we will continue with supportive care 3. Cardiac arrest rhythm is been stable 4. Healthcare associated pneumonia treated we will follow 5. Acute renal failure stable at this time we will continue present management 6. Dementia unchanged   I have personally seen and evaluated the patient, evaluated laboratory and imaging results, formulated  the assessment and plan and placed orders. The Patient requires high complexity decision making for assessment and support.  Case was discussed on Rounds with the Respiratory Therapy Staff  Allyne Gee, MD [facility] Pulmonary Critical Care Medicine Sleep Medicine

## 2017-11-05 LAB — VANCOMYCIN, TROUGH: Vancomycin Tr: 18 ug/mL (ref 15–20)

## 2017-11-05 NOTE — Progress Notes (Signed)
Pulmonary Critical Care Medicine [facility] [place]   PULMONARY CRITICAL CARE SERVICE  PROGRESS NOTE  Date of Service: 11/05/2017  Billy Liu  ZOX:[id number]  DOB: August 24, 1932   DOA: 10/27/2017  Referring Physician: Carron Curie, MD  HPI: Billy Liu is a 84 y.o. male seen for follow up of Acute on Chronic Respiratory Failure.  At this time patient is on T collar has been on 28% oxygen with a 8-hour goal  Medications: Reviewed on Rounds  Physical Exam:  Vitals: Temperature 97.0 pulse 68 respiratory rate 12 blood pressure 158/66 saturations 98%  Ventilator Settings currently is on T collar FiO2 28%  . General: Comfortable at this time . Eyes: Grossly normal lids, irises & conjunctiva . ENT: grossly tongue is normal . Neck: no obvious mass . Cardiovascular: S1 S2 normal no gallop . Respiratory: No rhonchi no rales are noted . Abdomen: soft . Skin: no rash seen on limited exam . Musculoskeletal: not rigid . Psychiatric:unable to assess . Neurologic: no seizure no involuntary movements         Lab Data:   Basic Metabolic Panel: Recent Labs  Lab 11/01/17 0604 11/04/17 0525  NA 135 135  K 3.8 4.6  CL 97* 95*  CO2 28 29  GLUCOSE 164* 157*  BUN 17 25*  CREATININE 0.93 1.21  CALCIUM 9.1 8.9  MG 1.9 2.3  PHOS  --  3.2    ABG: No results for input(s): PHART, PCO2ART, PO2ART, HCO3, O2SAT in the last 168 hours.  Liver Function Tests: No results for input(s): AST, ALT, ALKPHOS, BILITOT, PROT, ALBUMIN in the last 168 hours. No results for input(s): LIPASE, AMYLASE in the last 168 hours. No results for input(s): AMMONIA in the last 168 hours.  CBC: Recent Labs  Lab 11/01/17 0604 11/04/17 0525  WBC 10.2 6.5  HGB 8.6* 8.4*  HCT 28.6* 27.8*  MCV 79.0* 79.9*  PLT 506* 362    Cardiac Enzymes: No results for input(s): CKTOTAL, CKMB, CKMBINDEX, TROPONINI in the last 168 hours.  BNP (last 3 results) No results for input(s): BNP in the last  8760 hours.  ProBNP (last 3 results) No results for input(s): PROBNP in the last 8760 hours.  Radiological Exams: No results found.  Assessment/Plan Principal Problem:   Acute on chronic respiratory failure with hypoxia (HCC) Active Problems:   COPD (chronic obstructive pulmonary disease) (HCC)   Cardiac arrest (HCC)   Healthcare-associated pneumonia   Acute kidney injury superimposed on chronic kidney disease (HCC)   Dementia without behavioral disturbance (HCC)   1. Acute on chronic respiratory failure with hypoxia continue weaning on T collar titrate oxygen continue pulmonary toilet. 2. COPD severe disease continue present management. 3. Cardiac arrest rhythm stable 4. Healthcare associated pneumonia resolved 5. Acute renal failure follow-up on labs 6. Dementia at baseline   I have personally seen and evaluated the patient, evaluated laboratory and imaging results, formulated the assessment and plan and placed orders. The Patient requires high complexity decision making for assessment and support.  Case was discussed on Rounds with the Respiratory Therapy Staff  Yevonne Pax, MD [facility] Pulmonary Critical Care Medicine Sleep Medicine

## 2017-11-06 LAB — RENAL FUNCTION PANEL
ANION GAP: 7 (ref 5–15)
Albumin: 2.6 g/dL — ABNORMAL LOW (ref 3.5–5.0)
BUN: 19 mg/dL (ref 8–23)
CO2: 28 mmol/L (ref 22–32)
Calcium: 8.9 mg/dL (ref 8.9–10.3)
Chloride: 95 mmol/L — ABNORMAL LOW (ref 98–111)
Creatinine, Ser: 0.98 mg/dL (ref 0.61–1.24)
GFR calc Af Amer: >60 mL/min (ref >60)
GFR calc non Af Amer: >60 mL/min (ref >60)
GLUCOSE: 197 mg/dL — AB (ref 70–99)
POTASSIUM: 5 mmol/L (ref 3.5–5.1)
Phosphorus: 3.1 mg/dL (ref 2.5–4.6)
Sodium: 130 mmol/L — ABNORMAL LOW (ref 135–145)

## 2017-11-06 LAB — CBC
HCT: 30.2 % — ABNORMAL LOW (ref 39.0–52.0)
HEMOGLOBIN: 8.9 g/dL — AB (ref 13.0–17.0)
MCH: 23.7 pg — AB (ref 26.0–34.0)
MCHC: 29.5 g/dL — AB (ref 30.0–36.0)
MCV: 80.3 fL (ref 80.0–100.0)
Platelets: 400 10*3/uL (ref 150–400)
RBC: 3.76 MIL/uL — ABNORMAL LOW (ref 4.22–5.81)
RDW: 17.2 % — AB (ref 11.5–15.5)
WBC: 6.9 10*3/uL (ref 4.0–10.5)
nRBC: 0 % (ref 0.0–0.2)

## 2017-11-06 LAB — MAGNESIUM: Magnesium: 2.1 mg/dL (ref 1.7–2.4)

## 2017-11-06 NOTE — Progress Notes (Signed)
Pulmonary Critical Care Medicine [facility] [place]   PULMONARY CRITICAL CARE SERVICE  PROGRESS NOTE  Date of Service: 11/06/2017  Billy Liu  ZOX:[id number]  DOB: 1932-10-28   DOA: 10/27/2017  Referring Physician: Carron Curie, MD  HPI: Billy Liu is a 84 y.o. male seen for follow up of Acute on Chronic Respiratory Failure.  Patient is on T collar weaning goal is for 12 hours  Medications: Reviewed on Rounds  Physical Exam:  Vitals: Temperature 97.0 pulse 71 respiratory rate 17 blood pressure 159/65 saturations 97%  Ventilator Settings off the ventilator on T collar  . General: Comfortable at this time . Eyes: Grossly normal lids, irises & conjunctiva . ENT: grossly tongue is normal . Neck: no obvious mass . Cardiovascular: S1 S2 normal no gallop . Respiratory: No rhonchi or rales are noted . Abdomen: soft . Skin: no rash seen on limited exam . Musculoskeletal: not rigid . Psychiatric:unable to assess . Neurologic: no seizure no involuntary movements         Lab Data:   Basic Metabolic Panel: Recent Labs  Lab 11/01/17 0604 11/04/17 0525 11/06/17 0708  NA 135 135 130*  K 3.8 4.6 5.0  CL 97* 95* 95*  CO2 28 29 28   GLUCOSE 164* 157* 197*  BUN 17 25* 19  CREATININE 0.93 1.21 0.98  CALCIUM 9.1 8.9 8.9  MG 1.9 2.3 2.1  PHOS  --  3.2 3.1    ABG: No results for input(s): PHART, PCO2ART, PO2ART, HCO3, O2SAT in the last 168 hours.  Liver Function Tests: Recent Labs  Lab 11/06/17 0708  ALBUMIN 2.6*   No results for input(s): LIPASE, AMYLASE in the last 168 hours. No results for input(s): AMMONIA in the last 168 hours.  CBC: Recent Labs  Lab 11/01/17 0604 11/04/17 0525 11/06/17 0708  WBC 10.2 6.5 6.9  HGB 8.6* 8.4* 8.9*  HCT 28.6* 27.8* 30.2*  MCV 79.0* 79.9* 80.3  PLT 506* 362 400    Cardiac Enzymes: No results for input(s): CKTOTAL, CKMB, CKMBINDEX, TROPONINI in the last 168 hours.  BNP (last 3 results) No results for  input(s): BNP in the last 8760 hours.  ProBNP (last 3 results) No results for input(s): PROBNP in the last 8760 hours.  Radiological Exams: No results found.  Assessment/Plan Principal Problem:   Acute on chronic respiratory failure with hypoxia (HCC) Active Problems:   COPD (chronic obstructive pulmonary disease) (HCC)   Cardiac arrest (HCC)   Healthcare-associated pneumonia   Acute kidney injury superimposed on chronic kidney disease (HCC)   Dementia without behavioral disturbance (HCC)   1. Acute on chronic respiratory failure with hypoxia we will continue with T collar trials continue secretion management pulmonary toilet. 2. COPD severe disease at baseline we will follow 3. Cardiac arrest rhythm has been stable 4. Healthcare associated pneumonia treated 5. Acute renal failure labs improving 6. Dementia unchanged   I have personally seen and evaluated the patient, evaluated laboratory and imaging results, formulated the assessment and plan and placed orders. The Patient requires high complexity decision making for assessment and support.  Case was discussed on Rounds with the Respiratory Therapy Staff  Yevonne Pax, MD [facility] Pulmonary Critical Care Medicine Sleep Medicine

## 2017-11-07 LAB — BASIC METABOLIC PANEL
ANION GAP: 6 (ref 5–15)
BUN: 16 mg/dL (ref 8–23)
CHLORIDE: 96 mmol/L — AB (ref 98–111)
CO2: 27 mmol/L (ref 22–32)
Calcium: 9 mg/dL (ref 8.9–10.3)
Creatinine, Ser: 0.89 mg/dL (ref 0.61–1.24)
GFR calc non Af Amer: >60 mL/min (ref >60)
Glucose, Bld: 163 mg/dL — ABNORMAL HIGH (ref 70–99)
POTASSIUM: 5.2 mmol/L — AB (ref 3.5–5.1)
Sodium: 129 mmol/L — ABNORMAL LOW (ref 135–145)

## 2017-11-07 LAB — MAGNESIUM: Magnesium: 2 mg/dL (ref 1.7–2.4)

## 2017-11-07 NOTE — Progress Notes (Signed)
Pulmonary Critical Care Medicine [facility] [place]   PULMONARY CRITICAL CARE SERVICE  PROGRESS NOTE  Date of Service: 11/07/2017  Billy Liu  ZOX:[id number]  DOB: 18-Oct-1932   DOA: 10/27/2017  Referring Physician: Carron Curie, MD  HPI: Billy Liu is a 84 y.o. male seen for follow up of Acute on Chronic Respiratory Failure.  Patient is comfortable right now without distress.  Has been weaning on T collar  Medications: Reviewed on Rounds  Physical Exam:  Vitals: Temperature 97.7 pulse 88 respiratory rate 19 blood pressure 160/70 saturations 97%  Ventilator Settings off the ventilator on T collar  . General: Comfortable at this time . Eyes: Grossly normal lids, irises & conjunctiva . ENT: grossly tongue is normal . Neck: no obvious mass . Cardiovascular: S1 S2 normal no gallop . Respiratory: No rhonchi or rales are noted at this time . Abdomen: soft . Skin: no rash seen on limited exam . Musculoskeletal: not rigid . Psychiatric:unable to assess . Neurologic: no seizure no involuntary movements         Lab Data:   Basic Metabolic Panel: Recent Labs  Lab 11/01/17 0604 11/04/17 0525 11/06/17 0708 11/07/17 0614  NA 135 135 130* 129*  K 3.8 4.6 5.0 5.2*  CL 97* 95* 95* 96*  CO2 28 29 28 27   GLUCOSE 164* 157* 197* 163*  BUN 17 25* 19 16  CREATININE 0.93 1.21 0.98 0.89  CALCIUM 9.1 8.9 8.9 9.0  MG 1.9 2.3 2.1 2.0  PHOS  --  3.2 3.1  --     ABG: No results for input(s): PHART, PCO2ART, PO2ART, HCO3, O2SAT in the last 168 hours.  Liver Function Tests: Recent Labs  Lab 11/06/17 0708  ALBUMIN 2.6*   No results for input(s): LIPASE, AMYLASE in the last 168 hours. No results for input(s): AMMONIA in the last 168 hours.  CBC: Recent Labs  Lab 11/01/17 0604 11/04/17 0525 11/06/17 0708  WBC 10.2 6.5 6.9  HGB 8.6* 8.4* 8.9*  HCT 28.6* 27.8* 30.2*  MCV 79.0* 79.9* 80.3  PLT 506* 362 400    Cardiac Enzymes: No results for input(s):  CKTOTAL, CKMB, CKMBINDEX, TROPONINI in the last 168 hours.  BNP (last 3 results) No results for input(s): BNP in the last 8760 hours.  ProBNP (last 3 results) No results for input(s): PROBNP in the last 8760 hours.  Radiological Exams: No results found.  Assessment/Plan Principal Problem:   Acute on chronic respiratory failure with hypoxia (HCC) Active Problems:   COPD (chronic obstructive pulmonary disease) (HCC)   Cardiac arrest (HCC)   Healthcare-associated pneumonia   Acute kidney injury superimposed on chronic kidney disease (HCC)   Dementia without behavioral disturbance (HCC)   1. Acute on chronic respiratory failure with hypoxia continue weaning on T collar trials continue pulmonary toilet supportive care. 2. Cardiac arrest rhythm is been stable 3. Healthcare associated pneumonia treated we will follow 4. Acute renal failure on chronic renal failure continue to monitor the labs 5. Dementia continue with supportive care 6. COPD severe disease supportive care   I have personally seen and evaluated the patient, evaluated laboratory and imaging results, formulated the assessment and plan and placed orders. The Patient requires high complexity decision making for assessment and support.  Case was discussed on Rounds with the Respiratory Therapy Staff  Yevonne Pax, MD [facility] Pulmonary Critical Care Medicine Sleep Medicine

## 2017-11-08 LAB — POTASSIUM: Potassium: 4.9 mmol/L (ref 3.5–5.1)

## 2017-11-09 NOTE — Progress Notes (Signed)
Pulmonary Critical Care Medicine [facility] [place]   PULMONARY CRITICAL CARE SERVICE  PROGRESS NOTE  Date of Service: 11/09/2017  Billy Liu  ZOX:[id number]  DOB: June 14, 1932   DOA: 10/27/2017  Referring Physician: Carron Curie, MD  HPI: Billy Liu is a 84 y.o. male seen for follow up of Acute on Chronic Respiratory Failure.  Patient is comfortable right now no distress is noted.  Is been on on T collar at this time.  Medications: Reviewed on Rounds  Physical Exam:  Vitals: Temperature 98.6 pulse 87 respiratory rate 32 blood pressure 129/69 saturation 95%  Ventilator Settings on T collar right now comfortable  . General: Comfortable at this time . Eyes: Grossly normal lids, irises & conjunctiva . ENT: grossly tongue is normal . Neck: no obvious mass . Cardiovascular: S1 S2 normal no gallop . Respiratory: No rhonchi or rales are noted at this time . Abdomen: soft . Skin: no rash seen on limited exam . Musculoskeletal: not rigid . Psychiatric:unable to assess . Neurologic: no seizure no involuntary movements         Lab Data:   Basic Metabolic Panel: Recent Labs  Lab 11/04/17 0525 11/06/17 0708 11/07/17 0614 11/08/17 0612  NA 135 130* 129*  --   K 4.6 5.0 5.2* 4.9  CL 95* 95* 96*  --   CO2 29 28 27   --   GLUCOSE 157* 197* 163*  --   BUN 25* 19 16  --   CREATININE 1.21 0.98 0.89  --   CALCIUM 8.9 8.9 9.0  --   MG 2.3 2.1 2.0  --   PHOS 3.2 3.1  --   --     ABG: No results for input(s): PHART, PCO2ART, PO2ART, HCO3, O2SAT in the last 168 hours.  Liver Function Tests: Recent Labs  Lab 11/06/17 0708  ALBUMIN 2.6*   No results for input(s): LIPASE, AMYLASE in the last 168 hours. No results for input(s): AMMONIA in the last 168 hours.  CBC: Recent Labs  Lab 11/04/17 0525 11/06/17 0708  WBC 6.5 6.9  HGB 8.4* 8.9*  HCT 27.8* 30.2*  MCV 79.9* 80.3  PLT 362 400    Cardiac Enzymes: No results for input(s): CKTOTAL, CKMB,  CKMBINDEX, TROPONINI in the last 168 hours.  BNP (last 3 results) No results for input(s): BNP in the last 8760 hours.  ProBNP (last 3 results) No results for input(s): PROBNP in the last 8760 hours.  Radiological Exams: No results found.  Assessment/Plan Principal Problem:   Acute on chronic respiratory failure with hypoxia (HCC) Active Problems:   COPD (chronic obstructive pulmonary disease) (HCC)   Cardiac arrest (HCC)   Healthcare-associated pneumonia   Acute kidney injury superimposed on chronic kidney disease (HCC)   Dementia without behavioral disturbance (HCC)   1. Acute on chronic respiratory failure with hypoxia we will continue with T collar continue pulmonary toilet secretion management. 2. COPD severe disease we will continue with supportive care. 3. Cardiac arrest rhythm is stable 4. Healthcare associated pneumonia treated 5. Acute renal failure we will continue present management 6. Dementia stable we will continue to monitor   I have personally seen and evaluated the patient, evaluated laboratory and imaging results, formulated the assessment and plan and placed orders. The Patient requires high complexity decision making for assessment and support.  Case was discussed on Rounds with the Respiratory Therapy Staff  Yevonne Pax, MD [facility] Pulmonary Critical Care Medicine Sleep Medicine

## 2017-11-10 LAB — BASIC METABOLIC PANEL
ANION GAP: 6 (ref 5–15)
BUN: 19 mg/dL (ref 8–23)
CO2: 30 mmol/L (ref 22–32)
Calcium: 9.2 mg/dL (ref 8.9–10.3)
Chloride: 94 mmol/L — ABNORMAL LOW (ref 98–111)
Creatinine, Ser: 1.03 mg/dL (ref 0.61–1.24)
GFR calc non Af Amer: >60 mL/min (ref >60)
GLUCOSE: 200 mg/dL — AB (ref 70–99)
Potassium: 4.8 mmol/L (ref 3.5–5.1)
Sodium: 130 mmol/L — ABNORMAL LOW (ref 135–145)

## 2017-11-10 LAB — CBC
HEMATOCRIT: 30.9 % — AB (ref 39.0–52.0)
HEMOGLOBIN: 9.1 g/dL — AB (ref 13.0–17.0)
MCH: 23.7 pg — ABNORMAL LOW (ref 26.0–34.0)
MCHC: 29.4 g/dL — AB (ref 30.0–36.0)
MCV: 80.5 fL (ref 80.0–100.0)
NRBC: 0 % (ref 0.0–0.2)
Platelets: 378 10*3/uL (ref 150–400)
RBC: 3.84 MIL/uL — ABNORMAL LOW (ref 4.22–5.81)
RDW: 17.8 % — AB (ref 11.5–15.5)
WBC: 5.8 10*3/uL (ref 4.0–10.5)

## 2017-11-10 LAB — PHOSPHORUS: PHOSPHORUS: 4.4 mg/dL (ref 2.5–4.6)

## 2017-11-10 LAB — MAGNESIUM: Magnesium: 2.1 mg/dL (ref 1.7–2.4)

## 2017-11-10 NOTE — Progress Notes (Signed)
Pulmonary Critical Care Medicine [facility] [place]   PULMONARY CRITICAL CARE SERVICE  PROGRESS NOTE  Date of Service: 11/10/2017  Billy Liu  AOZ:[id number]  DOB: Jan 28, 1932   DOA: 10/27/2017  Referring Physician: Carron Curie, MD  HPI: Billy Liu is a 84 y.o. male seen for follow up of Acute on Chronic Respiratory Failure.  Patient is currently on T collar has been on 28% FiO2 tolerating a good well.  Right now is on T collar for more than 24 hours  Medications: Reviewed on Rounds  Physical Exam:  Vitals: Temperature 96.9 pulse 70 respiratory 14 blood pressure 151/81 saturations 100%  Ventilator Settings on T collar FiO2 28%  . General: Comfortable at this time . Eyes: Grossly normal lids, irises & conjunctiva . ENT: grossly tongue is normal . Neck: no obvious mass . Cardiovascular: S1 S2 normal no gallop . Respiratory: Coarse breath sounds with few rhonchi . Abdomen: soft . Skin: no rash seen on limited exam . Musculoskeletal: not rigid . Psychiatric:unable to assess . Neurologic: no seizure no involuntary movements         Lab Data:   Basic Metabolic Panel: Recent Labs  Lab 11/04/17 0525 11/06/17 0708 11/07/17 0614 11/08/17 0612 11/10/17 0711  NA 135 130* 129*  --  130*  K 4.6 5.0 5.2* 4.9 4.8  CL 95* 95* 96*  --  94*  CO2 29 28 27   --  30  GLUCOSE 157* 197* 163*  --  200*  BUN 25* 19 16  --  19  CREATININE 1.21 0.98 0.89  --  1.03  CALCIUM 8.9 8.9 9.0  --  9.2  MG 2.3 2.1 2.0  --  2.1  PHOS 3.2 3.1  --   --  4.4    ABG: No results for input(s): PHART, PCO2ART, PO2ART, HCO3, O2SAT in the last 168 hours.  Liver Function Tests: Recent Labs  Lab 11/06/17 0708  ALBUMIN 2.6*   No results for input(s): LIPASE, AMYLASE in the last 168 hours. No results for input(s): AMMONIA in the last 168 hours.  CBC: Recent Labs  Lab 11/04/17 0525 11/06/17 0708 11/10/17 0711  WBC 6.5 6.9 5.8  HGB 8.4* 8.9* 9.1*  HCT 27.8* 30.2* 30.9*   MCV 79.9* 80.3 80.5  PLT 362 400 378    Cardiac Enzymes: No results for input(s): CKTOTAL, CKMB, CKMBINDEX, TROPONINI in the last 168 hours.  BNP (last 3 results) No results for input(s): BNP in the last 8760 hours.  ProBNP (last 3 results) No results for input(s): PROBNP in the last 8760 hours.  Radiological Exams: No results found.  Assessment/Plan Principal Problem:   Acute on chronic respiratory failure with hypoxia (HCC) Active Problems:   COPD (chronic obstructive pulmonary disease) (HCC)   Cardiac arrest (HCC)   Healthcare-associated pneumonia   Acute kidney injury superimposed on chronic kidney disease (HCC)   Dementia without behavioral disturbance (HCC)   1. Acute on chronic respiratory failure with hypoxia we will continue with T collar trials continue pulmonary toilet supportive care. 2. COPD at baseline we will continue with present management. 3. Healthcare associated pneumonia treated we will continue with supportive care 4. Cardiac arrest rhythm is stable 5. Acute failure at baseline we will continue to monitor 6. Dementia unchanged we will continue to monitor   I have personally seen and evaluated the patient, evaluated laboratory and imaging results, formulated the assessment and plan and placed orders. The Patient requires high complexity decision making for assessment and support.  Case was discussed on Rounds with the Respiratory Therapy Staff  Allyne Gee, MD [facility] Pulmonary Critical Care Medicine Sleep Medicine

## 2017-11-11 LAB — BASIC METABOLIC PANEL
ANION GAP: 5 (ref 5–15)
BUN: 28 mg/dL — ABNORMAL HIGH (ref 8–23)
CO2: 33 mmol/L — ABNORMAL HIGH (ref 22–32)
Calcium: 9.3 mg/dL (ref 8.9–10.3)
Chloride: 93 mmol/L — ABNORMAL LOW (ref 98–111)
Creatinine, Ser: 1.17 mg/dL (ref 0.61–1.24)
GFR calc Af Amer: >60 mL/min (ref >60)
GFR calc non Af Amer: 56 mL/min — ABNORMAL LOW (ref >60)
GLUCOSE: 211 mg/dL — AB (ref 70–99)
POTASSIUM: 5.1 mmol/L (ref 3.5–5.1)
Sodium: 131 mmol/L — ABNORMAL LOW (ref 135–145)

## 2017-11-11 NOTE — Progress Notes (Signed)
Pulmonary Critical Care Medicine [facility] [place]   PULMONARY CRITICAL CARE SERVICE  PROGRESS NOTE  Date of Service: 11/11/2017  Billy Liu  ZOX:[id number]  DOB: 08/02/1932   DOA: 10/27/2017  Referring Physician: Carron Curie, MD  HPI: Billy Liu is a 85 y.o. male seen for follow up of Acute on Chronic Respiratory Failure.  Patient is on T collar currently 28% FiO2 more than 48 hours off the ventilator  Medications: Reviewed on Rounds  Physical Exam:  Vitals: Temperature 97.0 pulse 65 respiratory rate 16 blood pressure 121/40 saturation 99%  Ventilator Settings T collar FiO2 28%  . General: Comfortable at this time . Eyes: Grossly normal lids, irises & conjunctiva . ENT: grossly tongue is normal . Neck: no obvious mass . Cardiovascular: S1 S2 normal no gallop . Respiratory: No rhonchi or rales are noted . Abdomen: soft . Skin: no rash seen on limited exam . Musculoskeletal: not rigid . Psychiatric:unable to assess . Neurologic: no seizure no involuntary movements         Lab Data:   Basic Metabolic Panel: Recent Labs  Lab 11/06/17 0708 11/07/17 0614 11/08/17 0612 11/10/17 0711 11/11/17 0515  NA 130* 129*  --  130* 131*  K 5.0 5.2* 4.9 4.8 5.1  CL 95* 96*  --  94* 93*  CO2 28 27  --  30 33*  GLUCOSE 197* 163*  --  200* 211*  BUN 19 16  --  19 28*  CREATININE 0.98 0.89  --  1.03 1.17  CALCIUM 8.9 9.0  --  9.2 9.3  MG 2.1 2.0  --  2.1  --   PHOS 3.1  --   --  4.4  --     ABG: No results for input(s): PHART, PCO2ART, PO2ART, HCO3, O2SAT in the last 168 hours.  Liver Function Tests: Recent Labs  Lab 11/06/17 0708  ALBUMIN 2.6*   No results for input(s): LIPASE, AMYLASE in the last 168 hours. No results for input(s): AMMONIA in the last 168 hours.  CBC: Recent Labs  Lab 11/06/17 0708 11/10/17 0711  WBC 6.9 5.8  HGB 8.9* 9.1*  HCT 30.2* 30.9*  MCV 80.3 80.5  PLT 400 378    Cardiac Enzymes: No results for input(s):  CKTOTAL, CKMB, CKMBINDEX, TROPONINI in the last 168 hours.  BNP (last 3 results) No results for input(s): BNP in the last 8760 hours.  ProBNP (last 3 results) No results for input(s): PROBNP in the last 8760 hours.  Radiological Exams: No results found.  Assessment/Plan Principal Problem:   Acute on chronic respiratory failure with hypoxia (HCC) Active Problems:   COPD (chronic obstructive pulmonary disease) (HCC)   Cardiac arrest (HCC)   Healthcare-associated pneumonia   Acute kidney injury superimposed on chronic kidney disease (HCC)   Dementia without behavioral disturbance (HCC)   1. Acute on chronic respiratory failure hypoxia we will continue to collar trials continue pulmonary toilet supportive care. 2. COPD severe disease continue present management 3. Cardiac arrest rhythm is stable 4. Healthcare associated pneumonia treated we will continue to monitor 5. Acute renal failure follow-up on labs 6. Dementia unchanged   I have personally seen and evaluated the patient, evaluated laboratory and imaging results, formulated the assessment and plan and placed orders. The Patient requires high complexity decision making for assessment and support.  Case was discussed on Rounds with the Respiratory Therapy Staff  Yevonne Pax, MD [facility] Pulmonary Critical Care Medicine Sleep Medicine

## 2017-11-12 ENCOUNTER — Ambulatory Visit: Payer: Medicare Other | Admitting: Gastroenterology

## 2017-11-12 LAB — BASIC METABOLIC PANEL
ANION GAP: 6 (ref 5–15)
BUN: 39 mg/dL — ABNORMAL HIGH (ref 8–23)
CO2: 35 mmol/L — AB (ref 22–32)
Calcium: 9.4 mg/dL (ref 8.9–10.3)
Chloride: 92 mmol/L — ABNORMAL LOW (ref 98–111)
Creatinine, Ser: 1.39 mg/dL — ABNORMAL HIGH (ref 0.61–1.24)
GFR, EST AFRICAN AMERICAN: 52 mL/min — AB (ref >60)
GFR, EST NON AFRICAN AMERICAN: 45 mL/min — AB (ref >60)
Glucose, Bld: 170 mg/dL — ABNORMAL HIGH (ref 70–99)
POTASSIUM: 5 mmol/L (ref 3.5–5.1)
Sodium: 133 mmol/L — ABNORMAL LOW (ref 135–145)

## 2017-11-12 NOTE — Progress Notes (Signed)
Pulmonary Critical Care Medicine [facility] [place]   PULMONARY CRITICAL CARE SERVICE  PROGRESS NOTE  Date of Service: 11/12/2017  Billy Liu  [id number]  DOB: March 19, 1932   DOA: 10/27/2017  Referring Physician: Carron Curie, MD  HPI: Billy Liu is a 84 y.o. male seen for follow up of Acute on Chronic Respiratory Failure.  Patient is on T collar currently without distress.  Has been on 28% oxygen.  Has been off the ventilator more than 72 hours  Medications: Reviewed on Rounds  Physical Exam:  Vitals: Temperature 98.3 pulse 71 respiratory 14 blood pressure 115/48 saturations 99%  Ventilator Settings off the ventilator on T collar right now 28% FiO2  . General: Comfortable at this time . Eyes: Grossly normal lids, irises & conjunctiva . ENT: grossly tongue is normal . Neck: no obvious mass . Cardiovascular: S1 S2 normal no gallop . Respiratory: Coarse breath sounds no rhonchi . Abdomen: soft . Skin: no rash seen on limited exam . Musculoskeletal: not rigid . Psychiatric:unable to assess . Neurologic: no seizure no involuntary movements         Lab Data:   Basic Metabolic Panel: Recent Labs  Lab 11/06/17 0708 11/07/17 0614 11/08/17 0612 11/10/17 0711 11/11/17 0515 11/12/17 0543  NA 130* 129*  --  130* 131* 133*  K 5.0 5.2* 4.9 4.8 5.1 5.0  CL 95* 96*  --  94* 93* 92*  CO2 28 27  --  30 33* 35*  GLUCOSE 197* 163*  --  200* 211* 170*  BUN 19 16  --  19 28* 39*  CREATININE 0.98 0.89  --  1.03 1.17 1.39*  CALCIUM 8.9 9.0  --  9.2 9.3 9.4  MG 2.1 2.0  --  2.1  --   --   PHOS 3.1  --   --  4.4  --   --     ABG: No results for input(s): PHART, PCO2ART, PO2ART, HCO3, O2SAT in the last 168 hours.  Liver Function Tests: Recent Labs  Lab 11/06/17 0708  ALBUMIN 2.6*   No results for input(s): LIPASE, AMYLASE in the last 168 hours. No results for input(s): AMMONIA in the last 168 hours.  CBC: Recent Labs  Lab 11/06/17 0708  11/10/17 0711  WBC 6.9 5.8  HGB 8.9* 9.1*  HCT 30.2* 30.9*  MCV 80.3 80.5  PLT 400 378    Cardiac Enzymes: No results for input(s): CKTOTAL, CKMB, CKMBINDEX, TROPONINI in the last 168 hours.  BNP (last 3 results) No results for input(s): BNP in the last 8760 hours.  ProBNP (last 3 results) No results for input(s): PROBNP in the last 8760 hours.  Radiological Exams: No results found.  Assessment/Plan Principal Problem:   Acute on chronic respiratory failure with hypoxia (HCC) Active Problems:   COPD (chronic obstructive pulmonary disease) (HCC)   Cardiac arrest (HCC)   Healthcare-associated pneumonia   Acute kidney injury superimposed on chronic kidney disease (HCC)   Dementia without behavioral disturbance (HCC)   1. Acute on chronic respiratory failure with hypoxia we will continue with T collar trials continue aggressive pulmonary toilet supportive care. 2. COPD severe disease we will continue to monitor 3. Cardiac arrest rhythm is stable at this time 4. Healthcare associated pneumonia treated we will follow 5. Dementia at baseline 6. Acute renal failure follow-up on labs   I have personally seen and evaluated the patient, evaluated laboratory and imaging results, formulated the assessment and plan and placed orders. The Patient requires high  complexity decision making for assessment and support.  Case was discussed on Rounds with the Respiratory Therapy Staff  Allyne Gee, MD [facility] Pulmonary Critical Care Medicine Sleep Medicine

## 2017-11-13 LAB — BASIC METABOLIC PANEL
ANION GAP: 8 (ref 5–15)
BUN: 45 mg/dL — ABNORMAL HIGH (ref 8–23)
CO2: 32 mmol/L (ref 22–32)
CREATININE: 1.31 mg/dL — AB (ref 0.61–1.24)
Calcium: 9.1 mg/dL (ref 8.9–10.3)
Chloride: 94 mmol/L — ABNORMAL LOW (ref 98–111)
GFR calc Af Amer: 56 mL/min — ABNORMAL LOW (ref >60)
GFR, EST NON AFRICAN AMERICAN: 49 mL/min — AB (ref >60)
GLUCOSE: 193 mg/dL — AB (ref 70–99)
Potassium: 4.6 mmol/L (ref 3.5–5.1)
Sodium: 134 mmol/L — ABNORMAL LOW (ref 135–145)

## 2017-11-13 NOTE — Progress Notes (Signed)
Pulmonary Critical Care Medicine [facility] [place]   PULMONARY CRITICAL CARE SERVICE  PROGRESS NOTE  Date of Service: 11/13/2017  Billy Liu  ZOX:[id number]  DOB: 03-10-1932   DOA: 10/27/2017  Referring Physician: Carron Curie, MD  HPI: Billy Liu is a 84 y.o. male seen for follow up of Acute on Chronic Respiratory Failure.  Patient currently is on T collar has been on 28% FiO2 tolerating PMV  Medications: Reviewed on Rounds  Physical Exam:  Vitals: Temperature 97.5 pulse 95 respiratory 18 blood pressure 141/69  Ventilator Settings off the ventilator on T collar right now  . General: Comfortable at this time . Eyes: Grossly normal lids, irises & conjunctiva . ENT: grossly tongue is normal . Neck: no obvious mass . Cardiovascular: S1 S2 normal no gallop . Respiratory: No rhonchi or rales are noted at this time . Abdomen: soft . Skin: no rash seen on limited exam . Musculoskeletal: not rigid . Psychiatric:unable to assess . Neurologic: no seizure no involuntary movements         Lab Data:   Basic Metabolic Panel: Recent Labs  Lab 11/07/17 0614 11/08/17 0612 11/10/17 0711 11/11/17 0515 11/12/17 0543 11/13/17 0455  NA 129*  --  130* 131* 133* 134*  K 5.2* 4.9 4.8 5.1 5.0 4.6  CL 96*  --  94* 93* 92* 94*  CO2 27  --  30 33* 35* 32  GLUCOSE 163*  --  200* 211* 170* 193*  BUN 16  --  19 28* 39* 45*  CREATININE 0.89  --  1.03 1.17 1.39* 1.31*  CALCIUM 9.0  --  9.2 9.3 9.4 9.1  MG 2.0  --  2.1  --   --   --   PHOS  --   --  4.4  --   --   --     ABG: No results for input(s): PHART, PCO2ART, PO2ART, HCO3, O2SAT in the last 168 hours.  Liver Function Tests: No results for input(s): AST, ALT, ALKPHOS, BILITOT, PROT, ALBUMIN in the last 168 hours. No results for input(s): LIPASE, AMYLASE in the last 168 hours. No results for input(s): AMMONIA in the last 168 hours.  CBC: Recent Labs  Lab 11/10/17 0711  WBC 5.8  HGB 9.1*  HCT 30.9*   MCV 80.5  PLT 378    Cardiac Enzymes: No results for input(s): CKTOTAL, CKMB, CKMBINDEX, TROPONINI in the last 168 hours.  BNP (last 3 results) No results for input(s): BNP in the last 8760 hours.  ProBNP (last 3 results) No results for input(s): PROBNP in the last 8760 hours.  Radiological Exams: No results found.  Assessment/Plan Principal Problem:   Acute on chronic respiratory failure with hypoxia (HCC) Active Problems:   COPD (chronic obstructive pulmonary disease) (HCC)   Cardiac arrest (HCC)   Healthcare-associated pneumonia   Acute kidney injury superimposed on chronic kidney disease (HCC)   Dementia without behavioral disturbance (HCC)   1. Acute on chronic respiratory failure with hypoxia we will continue with T collar trials continue with PMV as ordered continue pulmonary toilet secretion management. 2. COPD severe disease we will follow along 3. Cardiac arrest rhythm is stable 4. Healthcare associated pneumonia treated we will monitor 5. Acute renal failure superimposed on chronic renal failure stable at this time we will continue to monitor labs 6. Dementia unchanged   I have personally seen and evaluated the patient, evaluated laboratory and imaging results, formulated the assessment and plan and placed orders. The Patient requires  high complexity decision making for assessment and support.  Case was discussed on Rounds with the Respiratory Therapy Staff  Allyne Gee, MD [facility] Pulmonary Critical Care Medicine Sleep Medicine

## 2017-11-14 ENCOUNTER — Other Ambulatory Visit (HOSPITAL_COMMUNITY): Payer: Medicare Other

## 2017-11-14 LAB — BASIC METABOLIC PANEL
Anion gap: 5 (ref 5–15)
BUN: 38 mg/dL — AB (ref 8–23)
CALCIUM: 9.2 mg/dL (ref 8.9–10.3)
CO2: 34 mmol/L — ABNORMAL HIGH (ref 22–32)
CREATININE: 1.14 mg/dL (ref 0.61–1.24)
Chloride: 98 mmol/L (ref 98–111)
GFR, EST NON AFRICAN AMERICAN: 58 mL/min — AB (ref >60)
Glucose, Bld: 188 mg/dL — ABNORMAL HIGH (ref 70–99)
Potassium: 4.2 mmol/L (ref 3.5–5.1)
SODIUM: 137 mmol/L (ref 135–145)

## 2017-11-14 LAB — CBC
HCT: 28 % — ABNORMAL LOW (ref 39.0–52.0)
HEMOGLOBIN: 8.4 g/dL — AB (ref 13.0–17.0)
MCH: 24.6 pg — ABNORMAL LOW (ref 26.0–34.0)
MCHC: 30 g/dL (ref 30.0–36.0)
MCV: 81.9 fL (ref 80.0–100.0)
NRBC: 0 % (ref 0.0–0.2)
PLATELETS: 284 10*3/uL (ref 150–400)
RBC: 3.42 MIL/uL — ABNORMAL LOW (ref 4.22–5.81)
RDW: 18.7 % — AB (ref 11.5–15.5)
WBC: 4.7 10*3/uL (ref 4.0–10.5)

## 2017-11-14 LAB — MAGNESIUM: Magnesium: 2.2 mg/dL (ref 1.7–2.4)

## 2017-11-14 NOTE — Progress Notes (Signed)
Pulmonary Critical Care Medicine [facility] [place]   PULMONARY CRITICAL CARE SERVICE  PROGRESS NOTE  Date of Service: 11/14/2017  Billy Liu  [id number]  DOB: 10-26-1932   DOA: 10/27/2017  Referring Physician: Carron Curie, MD  HPI: Billy Liu is a 84 y.o. male seen for follow up of Acute on Chronic Respiratory Failure.  Patient had been on T collar with the PMV went down for speech swallowing study which patient failed  Medications: Reviewed on Rounds  Physical Exam:  Vitals: Temperature 98.2 pulse 67 respiratory 20 blood pressure 138/59 saturations 98%  Ventilator Settings currently is on T collar off the ventilator  . General: Comfortable at this time . Eyes: Grossly normal lids, irises & conjunctiva . ENT: grossly tongue is normal . Neck: no obvious mass . Cardiovascular: S1 S2 normal no gallop . Respiratory: No rhonchi or rales are noted at this time . Abdomen: soft . Skin: no rash seen on limited exam . Musculoskeletal: not rigid . Psychiatric:unable to assess . Neurologic: no seizure no involuntary movements         Lab Data:   Basic Metabolic Panel: Recent Labs  Lab 11/10/17 0711 11/11/17 0515 11/12/17 0543 11/13/17 0455 11/14/17 0523  NA 130* 131* 133* 134* 137  K 4.8 5.1 5.0 4.6 4.2  CL 94* 93* 92* 94* 98  CO2 30 33* 35* 32 34*  GLUCOSE 200* 211* 170* 193* 188*  BUN 19 28* 39* 45* 38*  CREATININE 1.03 1.17 1.39* 1.31* 1.14  CALCIUM 9.2 9.3 9.4 9.1 9.2  MG 2.1  --   --   --  2.2  PHOS 4.4  --   --   --   --     ABG: No results for input(s): PHART, PCO2ART, PO2ART, HCO3, O2SAT in the last 168 hours.  Liver Function Tests: No results for input(s): AST, ALT, ALKPHOS, BILITOT, PROT, ALBUMIN in the last 168 hours. No results for input(s): LIPASE, AMYLASE in the last 168 hours. No results for input(s): AMMONIA in the last 168 hours.  CBC: Recent Labs  Lab 11/10/17 0711 11/14/17 0523  WBC 5.8 4.7  HGB 9.1* 8.4*  HCT  30.9* 28.0*  MCV 80.5 81.9  PLT 378 284    Cardiac Enzymes: No results for input(s): CKTOTAL, CKMB, CKMBINDEX, TROPONINI in the last 168 hours.  BNP (last 3 results) No results for input(s): BNP in the last 8760 hours.  ProBNP (last 3 results) No results for input(s): PROBNP in the last 8760 hours.  Radiological Exams: No results found.  Assessment/Plan Principal Problem:   Acute on chronic respiratory failure with hypoxia (HCC) Active Problems:   COPD (chronic obstructive pulmonary disease) (HCC)   Cardiac arrest (HCC)   Healthcare-associated pneumonia   Acute kidney injury superimposed on chronic kidney disease (HCC)   Dementia without behavioral disturbance (HCC)   1. Acute on chronic respiratory failure with hypoxia we will continue with PMV trials continue aggressive pulmonary toilet patient did fail with speech swallowing assessment 70 to monitor for any signs of pneumonia. 2. COPD severe disease continue with present management. 3. Cardiac arrest rhythm is stable we will monitor 4. Healthcare associated pneumonia treated we will continue present management 5. Acute kidney injury to monitor the labs 6. Dementia stable   I have personally seen and evaluated the patient, evaluated laboratory and imaging results, formulated the assessment and plan and placed orders. The Patient requires high complexity decision making for assessment and support.  Case was discussed on Rounds  with the Respiratory Therapy Staff  Allyne Gee, MD [facility] Pulmonary Critical Care Medicine Sleep Medicine

## 2017-11-15 NOTE — Progress Notes (Signed)
Pulmonary Critical Care Medicine [facility] [place]   PULMONARY CRITICAL CARE SERVICE  PROGRESS NOTE  Date of Service: 11/15/2017  Billy Liu  ZOX:[id number]  DOB: 01/11/1932   DOA: 10/27/2017  Referring Physician: Carron Curie, MD  HPI: Billy Liu is a 84 y.o. male seen for follow up of Acute on Chronic Respiratory Failure.  Currently on T collar patient has had issues with aspiration  Medications: Reviewed on Rounds  Physical Exam:  Vitals: Temperature 98.3 pulse 86 respiratory rate 21 blood pressure 174/75 saturation 99%  Ventilator Settings of the ventilator on T collar right now  . General: Comfortable at this time . Eyes: Grossly normal lids, irises & conjunctiva . ENT: grossly tongue is normal . Neck: no obvious mass . Cardiovascular: S1 S2 normal no gallop . Respiratory: No rhonchi or rales are noted . Abdomen: soft . Skin: no rash seen on limited exam . Musculoskeletal: not rigid . Psychiatric:unable to assess . Neurologic: no seizure no involuntary movements         Lab Data:   Basic Metabolic Panel: Recent Labs  Lab 11/10/17 0711 11/11/17 0515 11/12/17 0543 11/13/17 0455 11/14/17 0523  NA 130* 131* 133* 134* 137  K 4.8 5.1 5.0 4.6 4.2  CL 94* 93* 92* 94* 98  CO2 30 33* 35* 32 34*  GLUCOSE 200* 211* 170* 193* 188*  BUN 19 28* 39* 45* 38*  CREATININE 1.03 1.17 1.39* 1.31* 1.14  CALCIUM 9.2 9.3 9.4 9.1 9.2  MG 2.1  --   --   --  2.2  PHOS 4.4  --   --   --   --     ABG: No results for input(s): PHART, PCO2ART, PO2ART, HCO3, O2SAT in the last 168 hours.  Liver Function Tests: No results for input(s): AST, ALT, ALKPHOS, BILITOT, PROT, ALBUMIN in the last 168 hours. No results for input(s): LIPASE, AMYLASE in the last 168 hours. No results for input(s): AMMONIA in the last 168 hours.  CBC: Recent Labs  Lab 11/10/17 0711 11/14/17 0523  WBC 5.8 4.7  HGB 9.1* 8.4*  HCT 30.9* 28.0*  MCV 80.5 81.9  PLT 378 284     Cardiac Enzymes: No results for input(s): CKTOTAL, CKMB, CKMBINDEX, TROPONINI in the last 168 hours.  BNP (last 3 results) No results for input(s): BNP in the last 8760 hours.  ProBNP (last 3 results) No results for input(s): PROBNP in the last 8760 hours.  Radiological Exams: No results found.  Assessment/Plan Principal Problem:   Acute on chronic respiratory failure with hypoxia (HCC) Active Problems:   COPD (chronic obstructive pulmonary disease) (HCC)   Cardiac arrest (HCC)   Healthcare-associated pneumonia   Acute kidney injury superimposed on chronic kidney disease (HCC)   Dementia without behavioral disturbance (HCC)   1. Acute on chronic respiratory failure with hypoxia we will continue with T collar trials continue pulmonary toilet supportive care. 2. COPD at baseline we will continue present management 3. Cardiac arrest rhythm is stable 4. Healthcare associated pneumonia treated we will continue to monitor 5. Acute renal failure follow-up labs 6. Dementia stable continue present therapy   I have personally seen and evaluated the patient, evaluated laboratory and imaging results, formulated the assessment and plan and placed orders. The Patient requires high complexity decision making for assessment and support.  Case was discussed on Rounds with the Respiratory Therapy Staff  Yevonne Pax, MD [facility] Pulmonary Critical Care Medicine Sleep Medicine

## 2017-11-16 NOTE — Progress Notes (Signed)
Pulmonary Critical Care Medicine [facility] [place]   PULMONARY CRITICAL CARE SERVICE  PROGRESS NOTE  Date of Service: 11/16/2017  Billy Liu  ZOX:[id number]  DOB: 11/16/1932   DOA: 10/27/2017  Referring Physician: Carron Curie, MD  HPI: Billy Liu is a 84 y.o. male seen for follow up of Acute on Chronic Respiratory Failure.  Currently is on T collar patient is on 28% FiO2 good saturations are noted  Medications: Reviewed on Rounds  Physical Exam:  Vitals: Temperature 98.4 pulse 71 respiratory rate 16 blood pressure 159/68 saturation 98%  Ventilator Settings off the ventilator on T collar  . General: Comfortable at this time . Eyes: Grossly normal lids, irises & conjunctiva . ENT: grossly tongue is normal . Neck: no obvious mass . Cardiovascular: S1 S2 normal no gallop . Respiratory: No rhonchi or rales . Abdomen: soft . Skin: no rash seen on limited exam . Musculoskeletal: not rigid . Psychiatric:unable to assess . Neurologic: no seizure no involuntary movements         Lab Data:   Basic Metabolic Panel: Recent Labs  Lab 11/10/17 0711 11/11/17 0515 11/12/17 0543 11/13/17 0455 11/14/17 0523  NA 130* 131* 133* 134* 137  K 4.8 5.1 5.0 4.6 4.2  CL 94* 93* 92* 94* 98  CO2 30 33* 35* 32 34*  GLUCOSE 200* 211* 170* 193* 188*  BUN 19 28* 39* 45* 38*  CREATININE 1.03 1.17 1.39* 1.31* 1.14  CALCIUM 9.2 9.3 9.4 9.1 9.2  MG 2.1  --   --   --  2.2  PHOS 4.4  --   --   --   --     ABG: No results for input(s): PHART, PCO2ART, PO2ART, HCO3, O2SAT in the last 168 hours.  Liver Function Tests: No results for input(s): AST, ALT, ALKPHOS, BILITOT, PROT, ALBUMIN in the last 168 hours. No results for input(s): LIPASE, AMYLASE in the last 168 hours. No results for input(s): AMMONIA in the last 168 hours.  CBC: Recent Labs  Lab 11/10/17 0711 11/14/17 0523  WBC 5.8 4.7  HGB 9.1* 8.4*  HCT 30.9* 28.0*  MCV 80.5 81.9  PLT 378 284    Cardiac  Enzymes: No results for input(s): CKTOTAL, CKMB, CKMBINDEX, TROPONINI in the last 168 hours.  BNP (last 3 results) No results for input(s): BNP in the last 8760 hours.  ProBNP (last 3 results) No results for input(s): PROBNP in the last 8760 hours.  Radiological Exams: No results found.  Assessment/Plan Principal Problem:   Acute on chronic respiratory failure with hypoxia (HCC) Active Problems:   COPD (chronic obstructive pulmonary disease) (HCC)   Cardiac arrest (HCC)   Healthcare-associated pneumonia   Acute kidney injury superimposed on chronic kidney disease (HCC)   Dementia without behavioral disturbance (HCC)   1. Acute on chronic respiratory failure with hypoxia we will continue with T collar trials continue secretion management pulmonary toilet. 2. COPD at baseline severe disease 3. Cardiac arrest rhythm has been stable 4. Healthcare associated pneumonia treated we will monitor 5. Acute renal failure follow-up on labs 6. Dementia grossly unchanged   I have personally seen and evaluated the patient, evaluated laboratory and imaging results, formulated the assessment and plan and placed orders. The Patient requires high complexity decision making for assessment and support.  Case was discussed on Rounds with the Respiratory Therapy Staff  Yevonne Pax, MD [facility] Pulmonary Critical Care Medicine Sleep Medicine

## 2017-11-17 ENCOUNTER — Other Ambulatory Visit (HOSPITAL_COMMUNITY): Payer: Medicare Other

## 2017-11-17 LAB — CBC
HEMATOCRIT: 31.2 % — AB (ref 39.0–52.0)
Hemoglobin: 8.9 g/dL — ABNORMAL LOW (ref 13.0–17.0)
MCH: 23.9 pg — AB (ref 26.0–34.0)
MCHC: 28.5 g/dL — AB (ref 30.0–36.0)
MCV: 83.6 fL (ref 80.0–100.0)
NRBC: 0 % (ref 0.0–0.2)
Platelets: 247 10*3/uL (ref 150–400)
RBC: 3.73 MIL/uL — ABNORMAL LOW (ref 4.22–5.81)
RDW: 19.1 % — AB (ref 11.5–15.5)
WBC: 7.8 10*3/uL (ref 4.0–10.5)

## 2017-11-17 LAB — BASIC METABOLIC PANEL
ANION GAP: 5 (ref 5–15)
BUN: 32 mg/dL — ABNORMAL HIGH (ref 8–23)
CALCIUM: 9.4 mg/dL (ref 8.9–10.3)
CO2: 31 mmol/L (ref 22–32)
Chloride: 101 mmol/L (ref 98–111)
Creatinine, Ser: 1.04 mg/dL (ref 0.61–1.24)
GFR calc Af Amer: >60 mL/min (ref >60)
GFR calc non Af Amer: >60 mL/min (ref >60)
GLUCOSE: 192 mg/dL — AB (ref 70–99)
Potassium: 4.4 mmol/L (ref 3.5–5.1)
Sodium: 137 mmol/L (ref 135–145)

## 2017-11-17 LAB — MAGNESIUM: Magnesium: 2.3 mg/dL (ref 1.7–2.4)

## 2017-11-17 NOTE — Progress Notes (Signed)
Pulmonary Critical Care Medicine [facility] [place]   PULMONARY CRITICAL CARE SERVICE  PROGRESS NOTE  Date of Service: 11/17/2017  Billy Liu  ZOX:[id number]  DOB: 11/14/1932   DOA: 10/27/2017  Referring Physician: Carron Curie, MD  HPI: Billy Liu is a 84 y.o. male seen for follow up of Acute on Chronic Respiratory Failure.  Patient is on T collar currently on 28% FiO2 good saturations are noted  Medications: Reviewed on Rounds  Physical Exam:  Vitals: Temperature 97.9 pulse 70 respiratory rate 18 blood pressure 133/66 saturation 98%  Ventilator Settings off the ventilator on T collar right now  . General: Comfortable at this time . Eyes: Grossly normal lids, irises & conjunctiva . ENT: grossly tongue is normal . Neck: no obvious mass . Cardiovascular: S1 S2 normal no gallop . Respiratory: No rhonchi no rales . Abdomen: soft . Skin: no rash seen on limited exam . Musculoskeletal: not rigid . Psychiatric:unable to assess . Neurologic: no seizure no involuntary movements         Lab Data:   Basic Metabolic Panel: Recent Labs  Lab 11/11/17 0515 11/12/17 0543 11/13/17 0455 11/14/17 0523 11/17/17 0758  NA 131* 133* 134* 137 137  K 5.1 5.0 4.6 4.2 4.4  CL 93* 92* 94* 98 101  CO2 33* 35* 32 34* 31  GLUCOSE 211* 170* 193* 188* 192*  BUN 28* 39* 45* 38* 32*  CREATININE 1.17 1.39* 1.31* 1.14 1.04  CALCIUM 9.3 9.4 9.1 9.2 9.4  MG  --   --   --  2.2 2.3    ABG: No results for input(s): PHART, PCO2ART, PO2ART, HCO3, O2SAT in the last 168 hours.  Liver Function Tests: No results for input(s): AST, ALT, ALKPHOS, BILITOT, PROT, ALBUMIN in the last 168 hours. No results for input(s): LIPASE, AMYLASE in the last 168 hours. No results for input(s): AMMONIA in the last 168 hours.  CBC: Recent Labs  Lab 11/14/17 0523 11/17/17 0758  WBC 4.7 7.8  HGB 8.4* 8.9*  HCT 28.0* 31.2*  MCV 81.9 83.6  PLT 284 247    Cardiac Enzymes: No results for  input(s): CKTOTAL, CKMB, CKMBINDEX, TROPONINI in the last 168 hours.  BNP (last 3 results) No results for input(s): BNP in the last 8760 hours.  ProBNP (last 3 results) No results for input(s): PROBNP in the last 8760 hours.  Radiological Exams: Dg Chest Port 1 View  Result Date: 11/17/2017 CLINICAL DATA:  84 year old male with pneumonia/pulmonary edema. Subsequent encounter. EXAM: PORTABLE CHEST 1 VIEW COMPARISON:  11/03/2017 chest x-ray. FINDINGS: Tracheostomy tube tip midline 8.2 cm above the carina. Central pulmonary vascular prominence without pulmonary edema. No segmental consolidation or pneumothorax. Heart size top-normal. IMPRESSION: 1. Central pulmonary vascular prominence without pulmonary edema. 2. No focal consolidation. Electronically Signed   By: Lacy Duverney M.D.   On: 11/17/2017 07:41    Assessment/Plan Principal Problem:   Acute on chronic respiratory failure with hypoxia (HCC) Active Problems:   COPD (chronic obstructive pulmonary disease) (HCC)   Cardiac arrest (HCC)   Healthcare-associated pneumonia   Acute kidney injury superimposed on chronic kidney disease (HCC)   Dementia without behavioral disturbance (HCC)   1. Acute on chronic respiratory failure with hypoxia patient remains on T collar not able to decannulate have aspiration with the swallowing study.  Last chest x-ray shows some pulmonary vascular congestion no pulmonary edema we will continue to monitor. 2. COPD at baseline continue with present therapy 3. Cardiac arrest rhythm is stable 4.  Healthcare associated pneumonia treated we will continue to follow 5. Acute renal failure on chronic labs are improving 6. Dementia unchanged   I have personally seen and evaluated the patient, evaluated laboratory and imaging results, formulated the assessment and plan and placed orders. The Patient requires high complexity decision making for assessment and support.  Case was discussed on Rounds with the  Respiratory Therapy Staff  Yevonne Pax, MD [facility] Pulmonary Critical Care Medicine Sleep Medicine

## 2017-11-18 NOTE — Progress Notes (Signed)
[facility]   PULMONARY CRITICAL CARE SERVICE  PROGRESS NOTE  Date of Service: 11/18/2017  Billy Liu  NFA:[id number]  DOB: 02/04/1932   DOA: 10/27/2017  Referring Physician: Carron CurieAli Hijazi, MD  HPI: Billy Liu is a 84 y.o. male seen for follow up of Acute on Chronic Respiratory Failure.  Patient is on T collar currently has been on 28% oxygen good saturations are noted.  Right now is comfortable without distress  Medications: Reviewed on Rounds  Physical Exam:  Vitals: Temperature 98.6 pulse 92 respiratory 27 blood pressure 172/76 saturation 96%  Ventilator Settings off the ventilator on T collar  . General: Comfortable at this time . Eyes: Grossly normal lids, irises & conjunctiva . ENT: grossly tongue is normal . Neck: no obvious mass . Cardiovascular: S1 S2 normal no gallop . Respiratory: No rhonchi no rales . Abdomen: soft . Skin: no rash seen on limited exam . Musculoskeletal: not rigid . Psychiatric:unable to assess . Neurologic: no seizure no involuntary movements         Lab Data:   Basic Metabolic Panel: Recent Labs  Lab 11/12/17 0543 11/13/17 0455 11/14/17 0523 11/17/17 0758  NA 133* 134* 137 137  K 5.0 4.6 4.2 4.4  CL 92* 94* 98 101  CO2 35* 32 34* 31  GLUCOSE 170* 193* 188* 192*  BUN 39* 45* 38* 32*  CREATININE 1.39* 1.31* 1.14 1.04  CALCIUM 9.4 9.1 9.2 9.4  MG  --   --  2.2 2.3    ABG: No results for input(s): PHART, PCO2ART, PO2ART, HCO3, O2SAT in the last 168 hours.  Liver Function Tests: No results for input(s): AST, ALT, ALKPHOS, BILITOT, PROT, ALBUMIN in the last 168 hours. No results for input(s): LIPASE, AMYLASE in the last 168 hours. No results for input(s): AMMONIA in the last 168 hours.  CBC: Recent Labs  Lab 11/14/17 0523 11/17/17 0758  WBC 4.7 7.8  HGB 8.4* 8.9*  HCT 28.0* 31.2*  MCV 81.9 83.6  PLT 284 247    Cardiac Enzymes: No results for input(s):  CKTOTAL, CKMB, CKMBINDEX, TROPONINI in the last 168 hours.  BNP (last 3 results) No results for input(s): BNP in the last 8760 hours.  ProBNP (last 3 results) No results for input(s): PROBNP in the last 8760 hours.  Radiological Exams: Dg Chest Port 1 View  Result Date: 11/17/2017 CLINICAL DATA:  84 year old male with pneumonia/pulmonary edema. Subsequent encounter. EXAM: PORTABLE CHEST 1 VIEW COMPARISON:  11/03/2017 chest x-ray. FINDINGS: Tracheostomy tube tip midline 8.2 cm above the carina. Central pulmonary vascular prominence without pulmonary edema. No segmental consolidation or pneumothorax. Heart size top-normal. IMPRESSION: 1. Central pulmonary vascular prominence without pulmonary edema. 2. No focal consolidation. Electronically Signed   By: Lacy DuverneySteven  Olson M.D.   On: 11/17/2017 07:41    Assessment/Plan Principal Problem:   Acute on chronic respiratory failure with hypoxia (HCC) Active Problems:   COPD (chronic obstructive pulmonary disease) (HCC)   Cardiac arrest (HCC)   Healthcare-associated pneumonia   Acute kidney injury superimposed on chronic kidney disease (HCC)   Dementia without behavioral disturbance (HCC)   1. Acute on chronic respiratory failure with hypoxia we will continue with T collar trials continue secretion management pulmonary toilet.  Patient is at baseline no plan for continuation. 2. COPD severe disease we will continue with present therapy 3. Cardiac arrest rhythm is stable we will monitor 4. Healthcare associated pneumonia treated continue present therapy 5. Dementia remains stable 6. Acute  renal failure follow-up labs stable   I have personally seen and evaluated the patient, evaluated laboratory and imaging results, formulated the assessment and plan and placed orders. The Patient requires high complexity decision making for assessment and support.  Case was discussed on Rounds with the Respiratory Therapy Staff  Yevonne Pax, MD  [facility] Pulmonary Critical Care Medicine Sleep Medicine

## 2017-11-19 NOTE — Progress Notes (Signed)
[facility]   PULMONARY CRITICAL CARE SERVICE  PROGRESS NOTE  Date of Service: 11/19/2017  Billy Liu  JXB:[id number]:[id number]  DOB: 03/31/1932   DOA: 10/27/2017  Referring Physician: Carron CurieAli Hijazi, MD  HPI: Billy Liu is a 84 y.o. male seen for follow up of Acute on Chronic Respiratory Failure.  Currently is on T collar patient is on 28% oxygen good saturations are noted at this time.  Secretions are fair to moderate  Medications: Reviewed on Rounds  Physical Exam:  Vitals: Temperature 99.3 pulse 76 respiratory rate 22 blood pressure 157/70 saturations 94%  Ventilator Settings on T collar FiO2 28%  . General: Comfortable at this time . Eyes: Grossly normal lids, irises & conjunctiva . ENT: grossly tongue is normal . Neck: no obvious mass . Cardiovascular: S1 S2 normal no gallop . Respiratory: No rhonchi or rales are noted . Abdomen: soft . Skin: no rash seen on limited exam . Musculoskeletal: not rigid . Psychiatric:unable to assess . Neurologic: no seizure no involuntary movements         Lab Data:   Basic Metabolic Panel: Recent Labs  Lab 11/13/17 0455 11/14/17 0523 11/17/17 0758  NA 134* 137 137  K 4.6 4.2 4.4  CL 94* 98 101  CO2 32 34* 31  GLUCOSE 193* 188* 192*  BUN 45* 38* 32*  CREATININE 1.31* 1.14 1.04  CALCIUM 9.1 9.2 9.4  MG  --  2.2 2.3    ABG: No results for input(s): PHART, PCO2ART, PO2ART, HCO3, O2SAT in the last 168 hours.  Liver Function Tests: No results for input(s): AST, ALT, ALKPHOS, BILITOT, PROT, ALBUMIN in the last 168 hours. No results for input(s): LIPASE, AMYLASE in the last 168 hours. No results for input(s): AMMONIA in the last 168 hours.  CBC: Recent Labs  Lab 11/14/17 0523 11/17/17 0758  WBC 4.7 7.8  HGB 8.4* 8.9*  HCT 28.0* 31.2*  MCV 81.9 83.6  PLT 284 247    Cardiac Enzymes: No results for input(s): CKTOTAL, CKMB, CKMBINDEX, TROPONINI in the last 168  hours.  BNP (last 3 results) No results for input(s): BNP in the last 8760 hours.  ProBNP (last 3 results) No results for input(s): PROBNP in the last 8760 hours.  Radiological Exams: No results found.  Assessment/Plan Principal Problem:   Acute on chronic respiratory failure with hypoxia (HCC) Active Problems:   COPD (chronic obstructive pulmonary disease) (HCC)   Cardiac arrest (HCC)   Healthcare-associated pneumonia   Acute kidney injury superimposed on chronic kidney disease (HCC)   Dementia without behavioral disturbance (HCC)   1. Acute on chronic respiratory failure with hypoxia continue with T collar trials continue pulmonary toilet supportive care. 2. COPD severe disease we will continue to monitor 3. Cardiac arrest rhythm is stable will follow 4. Healthcare associated pneumonia treated continue with supportive care. 5. Acute renal failure on chronic renal failure at baseline follow-up on labs 6. Dementia unchanged   I have personally seen and evaluated the patient, evaluated laboratory and imaging results, formulated the assessment and plan and placed orders. The Patient requires high complexity decision making for assessment and support.  Case was discussed on Rounds with the Respiratory Therapy Staff  Yevonne PaxSaadat A Ysmael Hires, MD [facility] Pulmonary Critical Care Medicine Sleep Medicine

## 2017-11-20 LAB — BASIC METABOLIC PANEL
ANION GAP: 9 (ref 5–15)
BUN: 43 mg/dL — ABNORMAL HIGH (ref 8–23)
CALCIUM: 9.2 mg/dL (ref 8.9–10.3)
CO2: 29 mmol/L (ref 22–32)
CREATININE: 1.23 mg/dL (ref 0.61–1.24)
Chloride: 102 mmol/L (ref 98–111)
GFR calc Af Amer: >60 mL/min (ref >60)
GFR, EST NON AFRICAN AMERICAN: 52 mL/min — AB (ref >60)
GLUCOSE: 201 mg/dL — AB (ref 70–99)
Potassium: 4.5 mmol/L (ref 3.5–5.1)
Sodium: 140 mmol/L (ref 135–145)

## 2017-11-20 LAB — CBC
HCT: 27.1 % — ABNORMAL LOW (ref 39.0–52.0)
Hemoglobin: 8.1 g/dL — ABNORMAL LOW (ref 13.0–17.0)
MCH: 24.8 pg — ABNORMAL LOW (ref 26.0–34.0)
MCHC: 29.9 g/dL — AB (ref 30.0–36.0)
MCV: 83.1 fL (ref 80.0–100.0)
NRBC: 0 % (ref 0.0–0.2)
PLATELETS: 208 10*3/uL (ref 150–400)
RBC: 3.26 MIL/uL — ABNORMAL LOW (ref 4.22–5.81)
RDW: 20.5 % — AB (ref 11.5–15.5)
WBC: 9.1 10*3/uL (ref 4.0–10.5)

## 2017-11-20 LAB — MAGNESIUM: Magnesium: 2.3 mg/dL (ref 1.7–2.4)

## 2017-11-20 NOTE — Progress Notes (Signed)
[facility]   PULMONARY CRITICAL CARE SERVICE  PROGRESS NOTE  Date of Service: 11/20/2017  Billy BeamLawrence Resnik  WUJ:[id number]:[id number]  DOB: 08/25/1932   DOA: 10/27/2017  Referring Physician: Carron CurieAli Hijazi, MD  HPI: Billy Liu is a 84 y.o. male seen for follow up of Acute on Chronic Respiratory Failure.  Patient is currently on T collar has been on 28% oxygen good saturations are noted.  Sputum is growing Staphylococcus apparently  Medications: Reviewed on Rounds  Physical Exam:  Vitals: Temperature 98.0 pulse 81 respiratory rate 20 blood pressure 141/58 saturations 96%  Ventilator Settings currently is on T collar off the ventilator  . General: Comfortable at this time . Eyes: Grossly normal lids, irises & conjunctiva . ENT: grossly tongue is normal . Neck: no obvious mass . Cardiovascular: S1 S2 normal no gallop . Respiratory: Coarse breath sounds with few rhonchi . Abdomen: soft . Skin: no rash seen on limited exam . Musculoskeletal: not rigid . Psychiatric:unable to assess . Neurologic: no seizure no involuntary movements         Lab Data:   Basic Metabolic Panel: Recent Labs  Lab 11/14/17 0523 11/17/17 0758 11/20/17 0559  NA 137 137 140  K 4.2 4.4 4.5  CL 98 101 102  CO2 34* 31 29  GLUCOSE 188* 192* 201*  BUN 38* 32* 43*  CREATININE 1.14 1.04 1.23  CALCIUM 9.2 9.4 9.2  MG 2.2 2.3 2.3    ABG: No results for input(s): PHART, PCO2ART, PO2ART, HCO3, O2SAT in the last 168 hours.  Liver Function Tests: No results for input(s): AST, ALT, ALKPHOS, BILITOT, PROT, ALBUMIN in the last 168 hours. No results for input(s): LIPASE, AMYLASE in the last 168 hours. No results for input(s): AMMONIA in the last 168 hours.  CBC: Recent Labs  Lab 11/14/17 0523 11/17/17 0758 11/20/17 0559  WBC 4.7 7.8 9.1  HGB 8.4* 8.9* 8.1*  HCT 28.0* 31.2* 27.1*  MCV 81.9 83.6 83.1  PLT 284 247 208    Cardiac Enzymes: No results  for input(s): CKTOTAL, CKMB, CKMBINDEX, TROPONINI in the last 168 hours.  BNP (last 3 results) No results for input(s): BNP in the last 8760 hours.  ProBNP (last 3 results) No results for input(s): PROBNP in the last 8760 hours.  Radiological Exams: No results found.  Assessment/Plan Principal Problem:   Acute on chronic respiratory failure with hypoxia (HCC) Active Problems:   COPD (chronic obstructive pulmonary disease) (HCC)   Cardiac arrest (HCC)   Healthcare-associated pneumonia   Acute kidney injury superimposed on chronic kidney disease (HCC)   Dementia without behavioral disturbance (HCC)   1. Acute on chronic respiratory failure with hypoxia we will continue with T collar continue secretion management pulmonary toilet patient is at baseline. 2. COPD severe disease we will continue present management. 3. Cardiac arrest rhythm is stable we will continue to monitor 4. Healthcare associated pneumonia treated we will continue present therapy 5. Acute renal failure on chronic renal failure follow-up on labs 6. Dementia unchanged   I have personally seen and evaluated the patient, evaluated laboratory and imaging results, formulated the assessment and plan and placed orders. The Patient requires high complexity decision making for assessment and support.  Case was discussed on Rounds with the Respiratory Therapy Staff  Yevonne PaxSaadat A Khan, MD [facility] Pulmonary Critical Care Medicine Sleep Medicine

## 2017-11-21 LAB — CULTURE, RESPIRATORY

## 2017-11-21 LAB — CULTURE, RESPIRATORY W GRAM STAIN

## 2017-11-21 NOTE — Progress Notes (Signed)
[facility]   PULMONARY CRITICAL CARE SERVICE  PROGRESS NOTE  Date of Service: 11/21/2017  Billy BeamLawrence Darden  ZOX:[id number]:[id number]  DOB: 09/03/1932   DOA: 10/27/2017  Referring Physician: Carron CurieAli Hijazi, MD  HPI: Billy Liu is a 84 y.o. male seen for follow up of Acute on Chronic Respiratory Failure.  Patient remains on T collar currently is on 28% FiO2 tolerating it well right now  Medications: Reviewed on Rounds  Physical Exam:  Vitals: Temperature 98.2 pulse 69 respiratory rate 18 blood pressure 155/82 saturations 96%  Ventilator Settings off the ventilator right now  . General: Comfortable at this time . Eyes: Grossly normal lids, irises & conjunctiva . ENT: grossly tongue is normal . Neck: no obvious mass . Cardiovascular: S1 S2 normal no gallop . Respiratory: No rhonchi no rales are noted at this time . Abdomen: soft . Skin: no rash seen on limited exam . Musculoskeletal: not rigid . Psychiatric:unable to assess . Neurologic: no seizure no involuntary movements         Lab Data:   Basic Metabolic Panel: Recent Labs  Lab 11/17/17 0758 11/20/17 0559  NA 137 140  K 4.4 4.5  CL 101 102  CO2 31 29  GLUCOSE 192* 201*  BUN 32* 43*  CREATININE 1.04 1.23  CALCIUM 9.4 9.2  MG 2.3 2.3    ABG: No results for input(s): PHART, PCO2ART, PO2ART, HCO3, O2SAT in the last 168 hours.  Liver Function Tests: No results for input(s): AST, ALT, ALKPHOS, BILITOT, PROT, ALBUMIN in the last 168 hours. No results for input(s): LIPASE, AMYLASE in the last 168 hours. No results for input(s): AMMONIA in the last 168 hours.  CBC: Recent Labs  Lab 11/17/17 0758 11/20/17 0559  WBC 7.8 9.1  HGB 8.9* 8.1*  HCT 31.2* 27.1*  MCV 83.6 83.1  PLT 247 208    Cardiac Enzymes: No results for input(s): CKTOTAL, CKMB, CKMBINDEX, TROPONINI in the last 168 hours.  BNP (last 3 results) No results for input(s): BNP in the last 8760  hours.  ProBNP (last 3 results) No results for input(s): PROBNP in the last 8760 hours.  Radiological Exams: No results found.  Assessment/Plan Principal Problem:   Acute on chronic respiratory failure with hypoxia (HCC) Active Problems:   COPD (chronic obstructive pulmonary disease) (HCC)   Cardiac arrest (HCC)   Healthcare-associated pneumonia   Acute kidney injury superimposed on chronic kidney disease (HCC)   Dementia without behavioral disturbance (HCC)   1. Acute on chronic respiratory failure with hypoxia we will continue with T collar trials continue aggressive pulmonary toilet supportive care. 2. COPD at baseline right now seems to be stable 3. Cardiac arrest rhythm is stable 4. Healthcare associated pneumonia treated we will monitor 5. Acute renal failure continue to follow-up on labs 6. Dementia unchanged   I have personally seen and evaluated the patient, evaluated laboratory and imaging results, formulated the assessment and plan and placed orders. The Patient requires high complexity decision making for assessment and support.  Case was discussed on Rounds with the Respiratory Therapy Staff  Yevonne PaxSaadat A Shandrell Boda, MD [facility] Pulmonary Critical Care Medicine Sleep Medicine

## 2017-11-22 NOTE — Progress Notes (Signed)
[facility]ulmonary Critical Care [facility]   PULMONARY CRITICAL CARE SERVICE  PROGRESS NOTE  Date of Service: 11/22/2017  Billy BeamLawrence Liu  WUJ:[id number]:[id number]  DOB: 11/21/1932   DOA: 10/27/2017  Referring Physician: Carron CurieAli Hijazi, MD  HPI: Billy Liu is a 84 y.o. male seen for follow up of Acute on Chronic Respiratory Failure.  Patient right now is on T collar has been on 28% oxygen good saturations are noted at this time.  Secretions are fair to moderate  Medications: Reviewed on Rounds  Physical Exam:  Vitals: Temperature 98.0 pulse 81 respiratory rate 20 blood pressure 165/84 saturations 99%  Ventilator Settings on T collar right now 28% FiO2  . General: Comfortable at this time . Eyes: Grossly normal lids, irises & conjunctiva . ENT: grossly tongue is normal . Neck: no obvious mass . Cardiovascular: S1 S2 normal no gallop . Respiratory: Coarse rhonchi bilaterally . Abdomen: soft . Skin: no rash seen on limited exam . Musculoskeletal: not rigid . Psychiatric:unable to assess . Neurologic: no seizure no involuntary movements         Lab Data:   Basic Metabolic Panel: Recent Labs  Lab 11/17/17 0758 11/20/17 0559  NA 137 140  K 4.4 4.5  CL 101 102  CO2 31 29  GLUCOSE 192* 201*  BUN 32* 43*  CREATININE 1.04 1.23  CALCIUM 9.4 9.2  MG 2.3 2.3    ABG: No results for input(s): PHART, PCO2ART, PO2ART, HCO3, O2SAT in the last 168 hours.  Liver Function Tests: No results for input(s): AST, ALT, ALKPHOS, BILITOT, PROT, ALBUMIN in the last 168 hours. No results for input(s): LIPASE, AMYLASE in the last 168 hours. No results for input(s): AMMONIA in the last 168 hours.  CBC: Recent Labs  Lab 11/17/17 0758 11/20/17 0559  WBC 7.8 9.1  HGB 8.9* 8.1*  HCT 31.2* 27.1*  MCV 83.6 83.1  PLT 247 208    Cardiac Enzymes: No results for input(s): CKTOTAL, CKMB, CKMBINDEX, TROPONINI in the last 168 hours.  BNP (last 3 results) No results for  input(s): BNP in the last 8760 hours.  ProBNP (last 3 results) No results for input(s): PROBNP in the last 8760 hours.  Radiological Exams: No results found.  Assessment/Plan Principal Problem:   Acute on chronic respiratory failure with hypoxia (HCC) Active Problems:   COPD (chronic obstructive pulmonary disease) (HCC)   Cardiac arrest (HCC)   Healthcare-associated pneumonia   Acute kidney injury superimposed on chronic kidney disease (HCC)   Dementia without behavioral disturbance (HCC)   1. Acute on chronic respiratory failure with hypoxia we will continue with T collar trials continue pulmonary toilet supportive care. 2. COPD severe disease continue present management 3. Cardiac arrest rhythm stable 4. Healthcare associated pneumonia treated we will follow 5. Dementia stable at this time 6. Acute renal failure on chronic renal failure we will continue present therapy monitor labs   I have personally seen and evaluated the patient, evaluated laboratory and imaging results, formulated the assessment and plan and placed orders. The Patient requires high complexity decision making for assessment and support.  Case was discussed on Rounds with the Respiratory Therapy Staff  Yevonne PaxSaadat A , MD [facility] Pulmonary Critical Care Medicine Sleep Medicine

## 2017-11-23 LAB — RENAL FUNCTION PANEL
Albumin: 3.1 g/dL — ABNORMAL LOW (ref 3.5–5.0)
Anion gap: 10 (ref 5–15)
BUN: 47 mg/dL — AB (ref 8–23)
CALCIUM: 9.6 mg/dL (ref 8.9–10.3)
CO2: 27 mmol/L (ref 22–32)
Chloride: 105 mmol/L (ref 98–111)
Creatinine, Ser: 1.22 mg/dL (ref 0.61–1.24)
GFR calc Af Amer: >60 mL/min (ref >60)
GFR calc non Af Amer: 53 mL/min — ABNORMAL LOW (ref >60)
GLUCOSE: 206 mg/dL — AB (ref 70–99)
PHOSPHORUS: 4.4 mg/dL (ref 2.5–4.6)
POTASSIUM: 4.5 mmol/L (ref 3.5–5.1)
SODIUM: 142 mmol/L (ref 135–145)

## 2017-11-23 LAB — CBC
HCT: 31.5 % — ABNORMAL LOW (ref 39.0–52.0)
Hemoglobin: 9.1 g/dL — ABNORMAL LOW (ref 13.0–17.0)
MCH: 24.4 pg — ABNORMAL LOW (ref 26.0–34.0)
MCHC: 28.9 g/dL — ABNORMAL LOW (ref 30.0–36.0)
MCV: 84.5 fL (ref 80.0–100.0)
NRBC: 0 % (ref 0.0–0.2)
PLATELETS: 249 10*3/uL (ref 150–400)
RBC: 3.73 MIL/uL — ABNORMAL LOW (ref 4.22–5.81)
RDW: 20.8 % — AB (ref 11.5–15.5)
WBC: 6.4 10*3/uL (ref 4.0–10.5)

## 2017-11-23 LAB — MAGNESIUM: Magnesium: 2.7 mg/dL — ABNORMAL HIGH (ref 1.7–2.4)

## 2017-11-23 NOTE — Progress Notes (Signed)
[facility]   PULMONARY CRITICAL CARE SERVICE  PROGRESS NOTE  Date of Service: 11/23/2017  Billy Liu  ZOX:[id number]  DOB: 06/03/1932   DOA: 10/27/2017  Referring Physician: Carron CurieAli Hijazi, MD  HPI: Billy Liu is a 84 y.o. male seen for follow up of Acute on Chronic Respiratory Failure.  Currently is on T collar without distress at this time.  Has been on 28% oxygen  Medications: Reviewed on Rounds  Physical Exam:  Vitals: Temperature 97.4 pulse 73 respiratory rate 17 blood pressure 167/90 saturations 98%  Ventilator Settings off the ventilator on T collar  . General: Comfortable at this time . Eyes: Grossly normal lids, irises & conjunctiva . ENT: grossly tongue is normal . Neck: no obvious mass . Cardiovascular: S1 S2 normal no gallop . Respiratory: No rhonchi no rales . Abdomen: soft . Skin: no rash seen on limited exam . Musculoskeletal: not rigid . Psychiatric:unable to assess . Neurologic: no seizure no involuntary movements         Lab Data:   Basic Metabolic Panel: Recent Labs  Lab 11/17/17 0758 11/20/17 0559 11/23/17 0632  NA 137 140 142  K 4.4 4.5 4.5  CL 101 102 105  CO2 31 29 27   GLUCOSE 192* 201* 206*  BUN 32* 43* 47*  CREATININE 1.04 1.23 1.22  CALCIUM 9.4 9.2 9.6  MG 2.3 2.3 2.7*  PHOS  --   --  4.4    ABG: No results for input(s): PHART, PCO2ART, PO2ART, HCO3, O2SAT in the last 168 hours.  Liver Function Tests: Recent Labs  Lab 11/23/17 [id number]  ALBUMIN 3.1*   No results for input(s): LIPASE, AMYLASE in the last 168 hours. No results for input(s): AMMONIA in the last 168 hours.  CBC: Recent Labs  Lab 11/17/17 0758 11/20/17 0559 11/23/17 0632  WBC 7.8 9.1 6.4  HGB 8.9* 8.1* 9.1*  HCT 31.2* 27.1* 31.5*  MCV 83.6 83.1 84.5  PLT 247 208 249    Cardiac Enzymes: No results for input(s): CKTOTAL, CKMB, CKMBINDEX, TROPONINI in the last 168 hours.  BNP (last 3  results) No results for input(s): BNP in the last 8760 hours.  ProBNP (last 3 results) No results for input(s): PROBNP in the last 8760 hours.  Radiological Exams: No results found.  Assessment/Plan Principal Problem:   Acute on chronic respiratory failure with hypoxia (HCC) Active Problems:   COPD (chronic obstructive pulmonary disease) (HCC)   Cardiac arrest (HCC)   Healthcare-associated pneumonia   Acute kidney injury superimposed on chronic kidney disease (HCC)   Dementia without behavioral disturbance (HCC)   1. Acute on chronic respiratory failure with hypoxia continue with weaning on T collar continue pulmonary toilet secretion management. 2. COPD severe disease we will continue to monitor 3. Cardiac arrest rhythm is stable 4. Healthcare associated pneumonia treated 5. Dementia at baseline 6. Acute renal failure on chronic renal failure stable we will continue to monitor   I have personally seen and evaluated the patient, evaluated laboratory and imaging results, formulated the assessment and plan and placed orders. The Patient requires high complexity decision making for assessment and support.  Case was discussed on Rounds with the Respiratory Therapy Staff  Yevonne PaxSaadat A Lilybeth Vien, MD [facility] Pulmonary Critical Care Medicine Sleep Medicine

## 2017-11-24 NOTE — Progress Notes (Signed)
[facility]   PULMONARY CRITICAL CARE SERVICE  PROGRESS NOTE  Date of Service: 11/24/2017  Billy Liu  GNF:[id number]  DOB: 06/21/1932   DOA: 10/27/2017  Referring Physician: Carron CurieAli Hijazi, MD  HPI: Billy BeamLawrence Krenn is a 84 y.o. male seen for follow up of Acute on Chronic Respiratory Failure.  Patient is on T collar currently is on 28% oxygen good saturations are noted  Medications: Reviewed on Rounds  Physical Exam:  Vitals: Temperature 98.1 pulse 82 respiratory rate 23 blood pressure 132/78 saturation 98%  Ventilator Settings on T collar FiO2 28%  . General: Comfortable at this time . Eyes: Grossly normal lids, irises & conjunctiva . ENT: grossly tongue is normal . Neck: no obvious mass . Cardiovascular: S1 S2 normal no gallop . Respiratory: No rhonchi or rales are noted . Abdomen: soft . Skin: no rash seen on limited exam . Musculoskeletal: not rigid . Psychiatric:unable to assess . Neurologic: no seizure no involuntary movements         Lab Data:   Basic Metabolic Panel: Recent Labs  Lab 11/20/17 0559 11/23/17 0632  NA 140 142  K 4.5 4.5  CL 102 105  CO2 29 27  GLUCOSE 201* 206*  BUN 43* 47*  CREATININE 1.23 1.22  CALCIUM 9.2 9.6  MG 2.3 2.7*  PHOS  --  4.4    ABG: No results for input(s): PHART, PCO2ART, PO2ART, HCO3, O2SAT in the last 168 hours.  Liver Function Tests: Recent Labs  Lab 11/23/17 [id number]  ALBUMIN 3.1*   No results for input(s): LIPASE, AMYLASE in the last 168 hours. No results for input(s): AMMONIA in the last 168 hours.  CBC: Recent Labs  Lab 11/20/17 0559 11/23/17 0632  WBC 9.1 6.4  HGB 8.1* 9.1*  HCT 27.1* 31.5*  MCV 83.1 84.5  PLT 208 249    Cardiac Enzymes: No results for input(s): CKTOTAL, CKMB, CKMBINDEX, TROPONINI in the last 168 hours.  BNP (last 3 results) No results for input(s): BNP in the last 8760 hours.  ProBNP (last 3 results) No results for  input(s): PROBNP in the last 8760 hours.  Radiological Exams: No results found.  Assessment/Plan Principal Problem:   Acute on chronic respiratory failure with hypoxia (HCC) Active Problems:   COPD (chronic obstructive pulmonary disease) (HCC)   Cardiac arrest (HCC)   Healthcare-associated pneumonia   Acute kidney injury superimposed on chronic kidney disease (HCC)   Dementia without behavioral disturbance (HCC)   1. Acute on chronic respiratory failure with hypoxia we will continue with T collar trials continue pulmonary toilet supportive care. 2. COPD severe disease continue present therapy 3. Cardiac arrest rhythm is stable 4. Healthcare associated pneumonia treated we will continue to follow 5. Acute kidney injury on chronic kidney injury follow-up on labs 6. Dementia unchanged   I have personally seen and evaluated the patient, evaluated laboratory and imaging results, formulated the assessment and plan and placed orders. The Patient requires high complexity decision making for assessment and support.  Case was discussed on Rounds with the Respiratory Therapy Staff  Yevonne PaxSaadat A Khan, MD [facility] Pulmonary Critical Care Medicine Sleep Medicine

## 2017-11-25 ENCOUNTER — Institutional Professional Consult (permissible substitution) (HOSPITAL_COMMUNITY): Payer: Medicare Other

## 2017-11-25 LAB — CBC
HCT: 35 % — ABNORMAL LOW (ref 39.0–52.0)
Hemoglobin: 10.1 g/dL — ABNORMAL LOW (ref 13.0–17.0)
MCH: 24.5 pg — ABNORMAL LOW (ref 26.0–34.0)
MCHC: 28.9 g/dL — ABNORMAL LOW (ref 30.0–36.0)
MCV: 85 fL (ref 80.0–100.0)
NRBC: 0 % (ref 0.0–0.2)
Platelets: 277 10*3/uL (ref 150–400)
RBC: 4.12 MIL/uL — ABNORMAL LOW (ref 4.22–5.81)
RDW: 21.9 % — AB (ref 11.5–15.5)
WBC: 8.9 10*3/uL (ref 4.0–10.5)

## 2017-11-25 LAB — COMPREHENSIVE METABOLIC PANEL
ALBUMIN: 3.4 g/dL — AB (ref 3.5–5.0)
ALT: 19 U/L (ref 0–44)
AST: 52 U/L — AB (ref 15–41)
Alkaline Phosphatase: 87 U/L (ref 38–126)
Anion gap: 9 (ref 5–15)
BILIRUBIN TOTAL: 1.4 mg/dL — AB (ref 0.3–1.2)
BUN: 57 mg/dL — AB (ref 8–23)
CHLORIDE: 106 mmol/L (ref 98–111)
CO2: 27 mmol/L (ref 22–32)
Calcium: 9.5 mg/dL (ref 8.9–10.3)
Creatinine, Ser: 1.49 mg/dL — ABNORMAL HIGH (ref 0.61–1.24)
GFR calc Af Amer: 48 mL/min — ABNORMAL LOW (ref >60)
GFR calc non Af Amer: 42 mL/min — ABNORMAL LOW (ref >60)
GLUCOSE: 228 mg/dL — AB (ref 70–99)
POTASSIUM: 4.8 mmol/L (ref 3.5–5.1)
Sodium: 142 mmol/L (ref 135–145)
Total Protein: 7.7 g/dL (ref 6.5–8.1)

## 2017-11-25 LAB — BLOOD GAS, ARTERIAL
ACID-BASE EXCESS: 1.7 mmol/L (ref 0.0–2.0)
Bicarbonate: 26.2 mmol/L (ref 20.0–28.0)
FIO2: 28
O2 Saturation: 97 %
Patient temperature: 98.6
pCO2 arterial: 44.5 mmHg (ref 32.0–48.0)
pH, Arterial: 7.388 (ref 7.350–7.450)
pO2, Arterial: 94.3 mmHg (ref 83.0–108.0)

## 2017-11-25 LAB — CK TOTAL AND CKMB (NOT AT ARMC)
CK TOTAL: 44 U/L — AB (ref 49–397)
CK, MB: 2.6 ng/mL (ref 0.5–5.0)
CK, MB: 3 ng/mL (ref 0.5–5.0)
RELATIVE INDEX: INVALID (ref 0.0–2.5)
Relative Index: INVALID (ref 0.0–2.5)
Total CK: 67 U/L (ref 49–397)

## 2017-11-25 LAB — TROPONIN I: Troponin I: 0.03 ng/mL (ref <0.03)

## 2017-11-25 LAB — MAGNESIUM: Magnesium: 3 mg/dL — ABNORMAL HIGH (ref 1.7–2.4)

## 2017-11-25 MED FILL — Medication: Qty: 1 | Status: AC

## 2017-11-25 NOTE — Progress Notes (Signed)
[facility] [place]   PULMONARY CRITICAL CARE SERVICE  PROGRESS NOTE  Date of Service: 11/25/2017  Billy BeamLawrence Liu  UJW:[id number]  DOB: 06/26/1932   DOA: 10/27/2017  Referring Physician: Carron CurieAli Hijazi, MD  HPI: Billy Liu is a 84 y.o. male seen for follow up of Acute on Chronic Respiratory Failure.  Patient right now is doing better earlier patient had a rapid response and did not require any CPR but had been having a low blood pressure resuscitated now doing better  Medications: Reviewed on Rounds  Physical Exam:  Vitals: Temperature 98.0 pulse 91 respiratory 20 blood pressure 108/87 saturations 97%  Ventilator Settings off the ventilator on T collar  . General: Comfortable at this time . Eyes: Grossly normal lids, irises & conjunctiva . ENT: grossly tongue is normal . Neck: no obvious mass . Cardiovascular: S1 S2 normal no gallop . Respiratory: No rhonchi no rales . Abdomen: soft . Skin: no rash seen on limited exam . Musculoskeletal: not rigid . Psychiatric:unable to assess . Neurologic: no seizure no involuntary movements         Lab Data:   Basic Metabolic Panel: Recent Labs  Lab 11/20/17 0559 11/23/17 0632  NA 140 142  K 4.5 4.5  CL 102 105  CO2 29 27  GLUCOSE 201* 206*  BUN 43* 47*  CREATININE 1.23 1.22  CALCIUM 9.2 9.6  MG 2.3 2.7*  PHOS  --  4.4    ABG: No results for input(s): PHART, PCO2ART, PO2ART, HCO3, O2SAT in the last 168 hours.  Liver Function Tests: Recent Labs  Lab 11/23/17 [id number]  ALBUMIN 3.1*   No results for input(s): LIPASE, AMYLASE in the last 168 hours. No results for input(s): AMMONIA in the last 168 hours.  CBC: Recent Labs  Lab 11/20/17 0559 11/23/17 0632  WBC 9.1 6.4  HGB 8.1* 9.1*  HCT 27.1* 31.5*  MCV 83.1 84.5  PLT 208 249    Cardiac Enzymes: No results for input(s): CKTOTAL, CKMB, CKMBINDEX, TROPONINI in the last 168 hours.  BNP (last 3 results) No  results for input(s): BNP in the last 8760 hours.  ProBNP (last 3 results) No results for input(s): PROBNP in the last 8760 hours.  Radiological Exams: No results found.  Assessment/Plan Principal Problem:   Acute on chronic respiratory failure with hypoxia (HCC) Active Problems:   COPD (chronic obstructive pulmonary disease) (HCC)   Cardiac arrest (HCC)   Healthcare-associated pneumonia   Acute kidney injury superimposed on chronic kidney disease (HCC)   Dementia without behavioral disturbance (HCC)   1. Acute on chronic respiratory failure with hypoxia patient is doing well but better after rapid response continue to monitor secretions status continue with aggressive pulmonary toilet. 2. COPD severe disease continue with current management. 3. Cardiac arrest rhythm is stable we will continue to monitor 4. Healthcare associated pneumonia treated we will follow 5. Acute renal failure follow-up on labs blood has been drawn 6. Dementia unchanged   I have personally seen and evaluated the patient, evaluated laboratory and imaging results, formulated the assessment and plan and placed orders. The Patient requires high complexity decision making for assessment and support.  Case was discussed on Rounds with the Respiratory Therapy Staff  Yevonne PaxSaadat A Rosmery Duggin, MD [facility] Pulmonary Critical Care Medicine Sleep Medicine

## 2017-11-26 ENCOUNTER — Other Ambulatory Visit (HOSPITAL_COMMUNITY): Payer: Medicare Other

## 2017-11-26 LAB — BASIC METABOLIC PANEL
ANION GAP: 12 (ref 5–15)
BUN: 71 mg/dL — AB (ref 8–23)
CO2: 24 mmol/L (ref 22–32)
CREATININE: 1.55 mg/dL — AB (ref 0.61–1.24)
Calcium: 9.3 mg/dL (ref 8.9–10.3)
Chloride: 107 mmol/L (ref 98–111)
GFR calc Af Amer: 46 mL/min — ABNORMAL LOW (ref >60)
GFR calc non Af Amer: 40 mL/min — ABNORMAL LOW (ref >60)
Glucose, Bld: 203 mg/dL — ABNORMAL HIGH (ref 70–99)
Potassium: 4.2 mmol/L (ref 3.5–5.1)
Sodium: 143 mmol/L (ref 135–145)

## 2017-11-26 LAB — TROPONIN I: Troponin I: 0.03 ng/mL (ref <0.03)

## 2017-11-26 LAB — CK TOTAL AND CKMB (NOT AT ARMC)
CK, MB: 2.1 ng/mL (ref 0.5–5.0)
CK, MB: 1.8 ng/mL (ref 0.5–5.0)
RELATIVE INDEX: INVALID (ref 0.0–2.5)
Relative Index: INVALID (ref 0.0–2.5)
Total CK: 55 U/L (ref 49–397)
Total CK: 67 U/L (ref 49–397)

## 2017-11-26 NOTE — Progress Notes (Signed)
[facility] [place]   PULMONARY CRITICAL CARE SERVICE  PROGRESS NOTE  Date of Service: 11/26/2017  Billy Liu  ZOX:[id number]  DOB: 04/09/1932   DOA: 10/27/2017  Referring Physician: Carron CurieAli Hijazi, MD  HPI: Billy Liu is a 84 y.o. male seen for follow up of Acute on Chronic Respiratory Failure.  Currently patient's on T collar has been on 28% FiO2 good saturations are noted  Medications: Reviewed on Rounds  Physical Exam:  Vitals: Temperature 98.4 pulse 80 respiratory rate 15 blood pressure 130/62 saturation 95%  Ventilator Settings currently is off the ventilator on T collar  . General: Comfortable at this time . Eyes: Grossly normal lids, irises & conjunctiva . ENT: grossly tongue is normal . Neck: no obvious mass . Cardiovascular: S1 S2 normal no gallop . Respiratory: No rhonchi or rales are noted . Abdomen: soft . Skin: no rash seen on limited exam . Musculoskeletal: not rigid . Psychiatric:unable to assess . Neurologic: no seizure no involuntary movements         Lab Data:   Basic Metabolic Panel: Recent Labs  Lab 11/20/17 0559 11/23/17 0632 11/25/17 1457 11/26/17 0453  NA 140 142 142 143  K 4.5 4.5 4.8 4.2  CL 102 105 106 107  CO2 29 27 27 24   GLUCOSE 201* 206* 228* 203*  BUN 43* 47* 57* 71*  CREATININE 1.23 1.22 1.49* 1.55*  CALCIUM 9.2 9.6 9.5 9.3  MG 2.3 2.7* 3.0*  --   PHOS  --  4.4  --   --     ABG: Recent Labs  Lab 11/25/17 1415  PHART 7.388  PCO2ART 44.5  PO2ART 94.3  HCO3 26.2  O2SAT 97.0    Liver Function Tests: Recent Labs  Lab 11/23/17 0632 11/25/17 1457  AST  --  52*  ALT  --  19  ALKPHOS  --  87  BILITOT  --  1.4*  PROT  --  7.7  ALBUMIN 3.1* 3.4*   No results for input(s): LIPASE, AMYLASE in the last 168 hours. No results for input(s): AMMONIA in the last 168 hours.  CBC: Recent Labs  Lab 11/20/17 0559 11/23/17 0632 11/25/17 1457  WBC 9.1 6.4 8.9  HGB  8.1* 9.1* 10.1*  HCT 27.1* 31.5* 35.0*  MCV 83.1 84.5 85.0  PLT 208 249 277    Cardiac Enzymes: Recent Labs  Lab 11/25/17 1457 11/25/17 2032 11/26/17 0453 11/26/17 0940  CKTOTAL 67 44* 55 67  CKMB 2.6 3.0 2.1 1.8  TROPONINI  --  0.03* 0.03* <0.03    BNP (last 3 results) No results for input(s): BNP in the last 8760 hours.  ProBNP (last 3 results) No results for input(s): PROBNP in the last 8760 hours.  Radiological Exams: Ct Head Wo Contrast  Result Date: 11/26/2017 CLINICAL DATA:  Unexplained altered level of consciousness EXAM: CT HEAD WITHOUT CONTRAST TECHNIQUE: Contiguous axial images were obtained from the base of the skull through the vertex without intravenous contrast. COMPARISON:  10/09/2017 FINDINGS: Brain: No evidence of acute infarction, hemorrhage, hydrocephalus, extra-axial collection or mass lesion/mass effect. 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 Small vessel ischemic gliosis in the cerebral white matter. Small remote left cerebellar infarct. Vascular: Atherosclerotic calcification Skull: Unremarkable remote left parietal craniotomy. Sinuses/Orbits: Chronic partial bilateral mastoid opacification. Other: Motion degraded. IMPRESSION: 1. No acute finding. 2. Atrophy and chronic ischemic injury. Electronically Signed   By: Marnee SpringJonathon  Watts M.D.   On: 11/26/2017 09:28  Dg Chest Port 1 View  Result Date: 11/25/2017 CLINICAL DATA:  Unresponsive, hypotension EXAM: PORTABLE CHEST 1 VIEW COMPARISON:  Portable chest x-ray of 11/17/2016 FINDINGS: The lungs are not well aerated with mild basilar volume loss. No pneumonia or effusion is seen. Tracheostomy appears to remain unchanged in position. The heart is within upper limits normal and stable. There are degenerative changes throughout the thoracic spine. IMPRESSION: 1. No acute abnormality.  No pneumonia or pleural effusion. 2. Stable position of tracheostomy. Electronically Signed   By: Dwyane Dee M.D.   On: 11/25/2017 15:15    Assessment/Plan Principal Problem:   Acute on chronic respiratory failure with hypoxia (HCC) Active Problems:   COPD (chronic obstructive pulmonary disease) (HCC)   Cardiac arrest (HCC)   Healthcare-associated pneumonia   Acute kidney injury superimposed on chronic kidney disease (HCC)   Dementia without behavioral disturbance (HCC)   1. Acute on chronic respiratory failure with hypoxia we will continue with the T collar patient is doing better today saturations are improved.  Blood pressures improved 2. COPD severe disease 3. Cardiac arrest rhythm stable 4. Healthcare associated pneumonia treated resolved 5. Dementia unchanged acute 6. Acute kidney injury at baseline   I have personally seen and evaluated the patient, evaluated laboratory and imaging results, formulated the assessment and plan and placed orders. The Patient requires high complexity decision making for assessment and support.  Case was discussed on Rounds with the Respiratory Therapy Staff  Yevonne Pax, MD [facility] Pulmonary Critical Care Medicine Sleep Medicine

## 2017-11-27 LAB — BASIC METABOLIC PANEL
ANION GAP: 8 (ref 5–15)
BUN: 72 mg/dL — AB (ref 8–23)
CHLORIDE: 110 mmol/L (ref 98–111)
CO2: 29 mmol/L (ref 22–32)
Calcium: 9.3 mg/dL (ref 8.9–10.3)
Creatinine, Ser: 1.51 mg/dL — ABNORMAL HIGH (ref 0.61–1.24)
GFR, EST AFRICAN AMERICAN: 47 mL/min — AB (ref >60)
GFR, EST NON AFRICAN AMERICAN: 41 mL/min — AB (ref >60)
Glucose, Bld: 131 mg/dL — ABNORMAL HIGH (ref 70–99)
POTASSIUM: 4.9 mmol/L (ref 3.5–5.1)
SODIUM: 147 mmol/L — AB (ref 135–145)

## 2017-11-27 LAB — MAGNESIUM: MAGNESIUM: 3 mg/dL — AB (ref 1.7–2.4)

## 2017-11-27 NOTE — Progress Notes (Signed)
Pulmonary Critical Care Medicine [facility] [place]   PULMONARY CRITICAL CARE SERVICE  PROGRESS NOTE  Date of Service: 11/27/2017  Gamal Todisco  ZOX:[id number]  DOB: 1932-10-14   DOA: 10/27/2017  Referring Physician: Carron Curie, MD  HPI: Maui Britten is a 84 y.o. male seen for follow up of Acute on Chronic Respiratory Failure.  Currently is on T collar patient is on 28% oxygen good saturations are noted at this time.  Medications: Reviewed on Rounds  Physical Exam:  Vitals: Temperature 97.9 pulse 80 respiratory rate 14 blood pressure 135/57 saturation 96%  Ventilator Settings off the ventilator on T collar rate now  . General: Comfortable at this time . Eyes: Grossly normal lids, irises & conjunctiva . ENT: grossly tongue is normal . Neck: no obvious mass . Cardiovascular: S1 S2 normal no gallop . Respiratory: No rhonchi or rales are noted . Abdomen: soft . Skin: no rash seen on limited exam . Musculoskeletal: not rigid . Psychiatric:unable to assess . Neurologic: no seizure no involuntary movements         Lab Data:   Basic Metabolic Panel: Recent Labs  Lab 11/23/17 0632 11/25/17 1457 11/26/17 0453 11/27/17 0556  NA 142 142 143 147*  K 4.5 4.8 4.2 4.9  CL 105 106 107 110  CO2 27 27 24 29   GLUCOSE 206* 228* 203* 131*  BUN 47* 57* 71* 72*  CREATININE 1.22 1.49* 1.55* 1.51*  CALCIUM 9.6 9.5 9.3 9.3  MG 2.7* 3.0*  --  3.0*  PHOS 4.4  --   --   --     ABG: Recent Labs  Lab 11/25/17 1415  PHART 7.388  PCO2ART 44.5  PO2ART 94.3  HCO3 26.2  O2SAT 97.0    Liver Function Tests: Recent Labs  Lab 11/23/17 0632 11/25/17 1457  AST  --  52*  ALT  --  19  ALKPHOS  --  87  BILITOT  --  1.4*  PROT  --  7.7  ALBUMIN 3.1* 3.4*   No results for input(s): LIPASE, AMYLASE in the last 168 hours. No results for input(s): AMMONIA in the last 168 hours.  CBC: Recent Labs  Lab 11/23/17 0632 11/25/17 1457  WBC 6.4 8.9  HGB 9.1* 10.1*   HCT 31.5* 35.0*  MCV 84.5 85.0  PLT 249 277    Cardiac Enzymes: Recent Labs  Lab 11/25/17 1457 11/25/17 2032 11/26/17 0453 11/26/17 0940  CKTOTAL 67 44* 55 67  CKMB 2.6 3.0 2.1 1.8  TROPONINI  --  0.03* 0.03* <0.03    BNP (last 3 results) No results for input(s): BNP in the last 8760 hours.  ProBNP (last 3 results) No results for input(s): PROBNP in the last 8760 hours.  Radiological Exams: Ct Head Wo Contrast  Result Date: 11/26/2017 CLINICAL DATA:  Unexplained altered level of consciousness EXAM: CT HEAD WITHOUT CONTRAST TECHNIQUE: Contiguous axial images were obtained from the base of the skull through the vertex without intravenous contrast. COMPARISON:  10/09/2017 FINDINGS: Brain: No evidence of acute infarction, hemorrhage, hydrocephalus, extra-axial collection or mass lesion/mass effect. 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 Small vessel ischemic gliosis in the cerebral white matter. Small remote left cerebellar infarct. Vascular: Atherosclerotic calcification Skull: Unremarkable remote left parietal craniotomy. Sinuses/Orbits: Chronic partial bilateral mastoid opacification. Other: Motion degraded. IMPRESSION: 1. No acute finding. 2. Atrophy and chronic ischemic injury. Electronically Signed   By: Marnee Spring M.D.   On: 11/26/2017 09:28    Assessment/Plan Principal Problem:  Acute on chronic respiratory failure with hypoxia (HCC) Active Problems:   COPD (chronic obstructive pulmonary disease) (HCC)   Cardiac arrest (HCC)   Healthcare-associated pneumonia   Acute kidney injury superimposed on chronic kidney disease (HCC)   Dementia without behavioral disturbance (HCC)   1. Acute on chronic respiratory failure with hypoxia patient remains on T collar trials currently on 28% FiO2 continue pulmonary toilet secretion management. 2. COPD severe disease continue to monitor 3. Cardiac arrest rhythm is stable 4. Healthcare associated  pneumonia treated 5. Acute renal failure continue to follow labs 6. Dementia grossly unchanged   I have personally seen and evaluated the patient, evaluated laboratory and imaging results, formulated the assessment and plan and placed orders. The Patient requires high complexity decision making for assessment and support.  Case was discussed on Rounds with the Respiratory Therapy Staff  Yevonne PaxSaadat A [place] Dani, MD [facility] Pulmonary Critical Care Medicine Sleep Medicine

## 2017-11-28 LAB — BASIC METABOLIC PANEL
Anion gap: 5 (ref 5–15)
BUN: 58 mg/dL — AB (ref 8–23)
CALCIUM: 9 mg/dL (ref 8.9–10.3)
CO2: 29 mmol/L (ref 22–32)
Chloride: 112 mmol/L — ABNORMAL HIGH (ref 98–111)
Creatinine, Ser: 1.18 mg/dL (ref 0.61–1.24)
GFR calc Af Amer: >60 mL/min (ref >60)
GFR, EST NON AFRICAN AMERICAN: 55 mL/min — AB (ref >60)
Glucose, Bld: 151 mg/dL — ABNORMAL HIGH (ref 70–99)
Potassium: 4.9 mmol/L (ref 3.5–5.1)
Sodium: 146 mmol/L — ABNORMAL HIGH (ref 135–145)

## 2017-11-29 NOTE — Progress Notes (Signed)
Pulmonary Critical Care [facility] [place]   PULMONARY CRITICAL CARE SERVICE  PROGRESS NOTE  Date of Service: 11/29/2017  Feliz BeamLawrence Cossin  [id number]  DOB: 02/02/1932   DOA: 10/27/2017  Referring Physician: Carron CurieAli Hijazi, MD  HPI: Feliz BeamLawrence Kaser is a 84 y.o. male seen for follow up of Acute on Chronic Respiratory Failure.  Patient is on T collar currently has been on 28% FiO2  Medications: Reviewed on Rounds  Physical Exam:  Vitals: Temperature 97.2 pulse 78 respiratory rate 16 blood pressure 136/68 saturations 97%  Ventilator Settings currently off the ventilator on T collar  . General: Comfortable at this time . Eyes: Grossly normal lids, irises & conjunctiva . ENT: grossly tongue is normal . Neck: no obvious mass . Cardiovascular: S1 S2 normal no gallop . Respiratory: No rhonchi no rales are noted . Abdomen: soft . Skin: no rash seen on limited exam . Musculoskeletal: not rigid . Psychiatric:unable to assess . Neurologic: no seizure no involuntary movements         Lab Data:   Basic Metabolic Panel: Recent Labs  Lab 11/23/17 0632 11/25/17 1457 11/26/17 0453 11/27/17 0556 11/28/17 0552  NA 142 142 143 147* 146*  K 4.5 4.8 4.2 4.9 4.9  CL 105 106 107 110 112*  CO2 27 27 24 29 29   GLUCOSE 206* 228* 203* 131* 151*  BUN 47* 57* 71* 72* 58*  CREATININE 1.22 1.49* 1.55* 1.51* 1.18  CALCIUM 9.6 9.5 9.3 9.3 9.0  MG 2.7* 3.0*  --  3.0*  --   PHOS 4.4  --   --   --   --     ABG: Recent Labs  Lab 11/25/17 1415  PHART 7.388  PCO2ART 44.5  PO2ART 94.3  HCO3 26.2  O2SAT 97.0    Liver Function Tests: Recent Labs  Lab 11/23/17 0632 11/25/17 1457  AST  --  52*  ALT  --  19  ALKPHOS  --  87  BILITOT  --  1.4*  PROT  --  7.7  ALBUMIN 3.1* 3.4*   No results for input(s): LIPASE, AMYLASE in the last 168 hours. No results for input(s): AMMONIA in the last 168 hours.  CBC: Recent Labs  Lab 11/23/17 0632 11/25/17 1457  WBC  6.4 8.9  HGB 9.1* 10.1*  HCT 31.5* 35.0*  MCV 84.5 85.0  PLT 249 277    Cardiac Enzymes: Recent Labs  Lab 11/25/17 1457 11/25/17 2032 11/26/17 0453 11/26/17 0940  CKTOTAL 67 44* 55 67  CKMB 2.6 3.0 2.1 1.8  TROPONINI  --  0.03* 0.03* <0.03    BNP (last 3 results) No results for input(s): BNP in the last 8760 hours.  ProBNP (last 3 results) No results for input(s): PROBNP in the last 8760 hours.  Radiological Exams: No results found.  Assessment/Plan Principal Problem:   Acute on chronic respiratory failure with hypoxia (HCC) Active Problems:   COPD (chronic obstructive pulmonary disease) (HCC)   Cardiac arrest (HCC)   Healthcare-associated pneumonia   Acute kidney injury superimposed on chronic kidney disease (HCC)   Dementia without behavioral disturbance (HCC)   1. Acute on chronic respiratory failure with hypoxia we will continue with T collar weans as tolerated patient is at baseline 2. COPD severe disease at baseline 3. Cardiac arrest rhythm is been stable 4. Healthcare associated pneumonia resolved 5. Acute renal failure stable labs 6. Dementia at baseline   I have personally seen and evaluated the patient, evaluated laboratory and imaging results,  formulated the assessment and plan and placed orders. The Patient requires high complexity decision making for assessment and support.  Case was discussed on Rounds with the Respiratory Therapy Staff  Allyne Gee, MD [facility] Pulmonary Critical Care Medicine Sleep Medicine

## 2017-11-30 LAB — CBC
HEMATOCRIT: 32 % — AB (ref 39.0–52.0)
Hemoglobin: 9.1 g/dL — ABNORMAL LOW (ref 13.0–17.0)
MCH: 24.5 pg — ABNORMAL LOW (ref 26.0–34.0)
MCHC: 28.4 g/dL — AB (ref 30.0–36.0)
MCV: 86.3 fL (ref 80.0–100.0)
NRBC: 0 % (ref 0.0–0.2)
Platelets: 213 10*3/uL (ref 150–400)
RBC: 3.71 MIL/uL — ABNORMAL LOW (ref 4.22–5.81)
RDW: 22.2 % — ABNORMAL HIGH (ref 11.5–15.5)
WBC: 8.7 10*3/uL (ref 4.0–10.5)

## 2017-11-30 LAB — BASIC METABOLIC PANEL
Anion gap: 6 (ref 5–15)
BUN: 36 mg/dL — ABNORMAL HIGH (ref 8–23)
CO2: 29 mmol/L (ref 22–32)
Calcium: 9.2 mg/dL (ref 8.9–10.3)
Chloride: 107 mmol/L (ref 98–111)
Creatinine, Ser: 1.07 mg/dL (ref 0.61–1.24)
GFR calc non Af Amer: >60 mL/min (ref >60)
Glucose, Bld: 125 mg/dL — ABNORMAL HIGH (ref 70–99)
Potassium: 4.5 mmol/L (ref 3.5–5.1)
Sodium: 142 mmol/L (ref 135–145)

## 2017-11-30 NOTE — Progress Notes (Signed)
Pulmonary Critical Care Medicine [facility] [place]   PULMONARY CRITICAL CARE SERVICE  PROGRESS NOTE  Date of Service: 11/30/2017  Billy Liu  ZOX:[id number]  DOB: [birth year]   DOA: 10/27/2017  Referring Physician: Carron Curie, MD  HPI: Billy Liu is a 84 y.o. male seen for follow up of Acute on Chronic Respiratory Failure.  Patient is currently doing well on trach collar at 28% FiO2.  Medications: Reviewed on Rounds  Physical Exam:  Vitals: Temperature 97.0 pulse 74 respirations 20 blood pressure 161/75 O2 saturation 98%.  Ventilator Settings patient is currently not on the ventilator.  Remains on trach collar.  . General: Comfortable at this time . Eyes: Grossly normal lids, irises & conjunctiva . ENT: grossly tongue is normal . Neck: no obvious mass . Cardiovascular: S1 S2 normal no gallop . Respiratory: Coarse breath sounds noted bilaterally. . Abdomen: soft . Skin: no rash seen on limited exam . Musculoskeletal: not rigid . Psychiatric:unable to assess . Neurologic: no seizure no involuntary movements         Lab Data:   Basic Metabolic Panel: Recent Labs  Lab 11/25/17 1457 11/26/17 0453 11/27/17 0556 11/28/17 0552 11/30/17 0622  NA 142 143 147* 146* 142  K 4.8 4.2 4.9 4.9 4.5  CL 106 107 110 112* 107  CO2 27 24 29 29 29   GLUCOSE 228* 203* 131* 151* 125*  BUN 57* 71* 72* 58* 36*  CREATININE 1.49* 1.55* 1.51* 1.18 1.07  CALCIUM 9.5 9.3 9.3 9.0 9.2  MG 3.0*  --  3.0*  --   --     ABG: Recent Labs  Lab 11/25/17 1415  PHART 7.388  PCO2ART 44.5  PO2ART 94.3  HCO3 26.2  O2SAT 97.0    Liver Function Tests: Recent Labs  Lab 11/25/17 1457  AST 52*  ALT 19  ALKPHOS 87  BILITOT 1.4*  PROT 7.7  ALBUMIN 3.4*   No results for input(s): LIPASE, AMYLASE in the last 168 hours. No results for input(s): AMMONIA in the last 168 hours.  CBC: Recent Labs  Lab 11/25/17 1457 11/30/17 0501  WBC 8.9 8.7  HGB 10.1* 9.1*  HCT  35.0* 32.0*  MCV 85.0 86.3  PLT 277 213    Cardiac Enzymes: Recent Labs  Lab 11/25/17 1457 11/25/17 2032 11/26/17 0453 11/26/17 0940  CKTOTAL 67 44* 55 67  CKMB 2.6 3.0 2.1 1.8  TROPONINI  --  0.03* 0.03* <0.03    BNP (last 3 results) No results for input(s): BNP in the last 8760 hours.  ProBNP (last 3 results) No results for input(s): PROBNP in the last 8760 hours.  Radiological Exams: No results found.  Assessment/Plan Principal Problem:   Acute on chronic respiratory failure with hypoxia (HCC) Active Problems:   COPD (chronic obstructive pulmonary disease) (HCC)   Cardiac arrest (HCC)   Healthcare-associated pneumonia   Acute kidney injury superimposed on chronic kidney disease (HCC)   Dementia without behavioral disturbance (HCC)   1. Acute on chronic respiratory failure with hypoxia we will continue patient on trach collar weaning protocol as tolerated. 2. COPD severe disease, stable continue to monitor. 3. Cardiac arrest, rhythm is stable. 4. Healthcare associated pneumonia resolved 5. Acute renal failure creatinine is improving. 6. Dementia at baseline.   I have personally seen and evaluated the patient, evaluated laboratory and imaging results, formulated the assessment and plan and placed orders. The Patient requires high complexity decision making for assessment and support.  Case was discussed on Rounds with the  Respiratory Therapy Staff  Yevonne PaxSaadat A Khan, MD [facility] Pulmonary Critical Care Medicine Sleep Medicine

## 2017-12-18 ENCOUNTER — Encounter: Payer: Self-pay | Admitting: Internal Medicine

## 2017-12-18 ENCOUNTER — Ambulatory Visit (INDEPENDENT_AMBULATORY_CARE_PROVIDER_SITE_OTHER): Payer: Medicare Other | Admitting: Internal Medicine

## 2017-12-18 VITALS — BP 142/68 | HR 56 | Resp 16 | Ht 71.0 in | Wt 269.0 lb

## 2017-12-18 DIAGNOSIS — I25119 Atherosclerotic heart disease of native coronary artery with unspecified angina pectoris: Secondary | ICD-10-CM | POA: Diagnosis not present

## 2017-12-18 DIAGNOSIS — J9611 Chronic respiratory failure with hypoxia: Secondary | ICD-10-CM

## 2017-12-18 DIAGNOSIS — J449 Chronic obstructive pulmonary disease, unspecified: Secondary | ICD-10-CM

## 2017-12-18 DIAGNOSIS — N183 Chronic kidney disease, stage 3 unspecified: Secondary | ICD-10-CM

## 2017-12-18 DIAGNOSIS — Z930 Tracheostomy status: Secondary | ICD-10-CM | POA: Diagnosis not present

## 2017-12-18 DIAGNOSIS — J181 Lobar pneumonia, unspecified organism: Secondary | ICD-10-CM

## 2017-12-18 NOTE — Patient Instructions (Signed)
Tracheostomy A tracheostomy is a surgical procedure to make an opening into the main airway that leads to your lungs (trachea). A tracheostomy tube (trach tube) is then placed in this opening so you can breathe without using your nose or mouth. A tracheostomy may be permanent or temporary. You may need to have a trach tube if:  Your airway is blocked by: ? Swelling. ? Injury. ? Tumor. ? A foreign object. ? A vocal cord problem. ? Severe narrowing of the trachea.  You need long-term breathing assistance (ventilation).  You have too much mucus or other fluids (secretions) in your airway that need to be suctioned often.  Tell a health care provider about:  Any allergies you have.  All medicines you are taking, including vitamins, herbs, eye drops, creams, and over-the-counter medicines.  Any problems you or family members have had with anesthetic medicines.  Any blood disorders you have.  Any surgeries you have had.  Any medical conditions you have. What are the risks? Generally, tracheostomy is a safe procedure. However, problems can occur and include:  Bleeding.  Infection.  Puncture of the lung or lungs (pneumothorax).  Narrowing of the trachea, making it hard to breathe.  Damage to the gland that sits over the lower part of your neck (thyroid).  What happens before the procedure?  Do not eat or drink anything after midnight on the night before the procedure or as directed by your health care provider.  Ask your health care provider about: ? Changing or stopping your regular medicines. This is especially important if you are taking diabetes medicines or blood thinners. ? Taking medicines such as aspirin and ibuprofen. These medicines can thin your blood. Do not take these medicines before your procedure if your health care provider asks you not to. What happens during the procedure? If your tracheostomy is planned, it will be done in an operating room. Or, you may  have an emergency procedure at your hospital bedside or in the emergency room. This is what may happen during the procedure:  You may have an intravenous line (IV) put in your arm or hand. ? Medicine and fluids will flow through the IV. ? You may get antibiotic medicine through the IV.  You will be given one of the following: ? A medicine that numbs only your neck (local anesthetic). ? A medicine that makes you fall asleep (general anesthetic).  The front of your neck will be cleaned with a germ-killing solution (antiseptic).  Sterile drapes will be placed around your neck area.  After you are asleep or your neck is numb, the surgeon will make a cut (incision) just below your Adam's apple.  The surgeon will separate the muscles in your neck to reach the front of your trachea.  An incision or small window will be made into the front wall of your trachea.  A breathing tube (tracheal tube) will be put through the opening and left in place.  A cuff at the end of the tube will be inflated.  Any bleeding will be controlled.  The trach tube will be tied, taped, or sutured in place.  A bandage (dressing) will be placed over the incision.  What happens after the procedure? If the procedure was done in the operating room, you will stay in a recovery area until the anesthetic wears off. It may take several days to get used to living with a tracheostomy.  If you need breathing help, your tube will be connected to a   breathing machine (ventilator).  A new tracheostomy produces a lot of secretions. You may need to have your tracheostomy suctioned and cleaned frequently by nurses.  Before you go home, you and your caregivers will learn how to care for your tracheostomy, including how to: ? Clean your tracheostomy. ? Change the tube. ? Communicate. ? Eat safely. ? Protect your lungs.  This information is not intended to replace advice given to you by your health care provider. Make sure  you discuss any questions you have with your health care provider. Document Released: 09/18/2000 Document Revised: 08/28/2015 Document Reviewed: 03/01/2013 [employer]vier Interactive Patient Education  2018 [employer]  

## 2017-12-18 NOTE — Progress Notes (Signed)
[facility] [address] [place], [place] [address]  Pulmonary Sleep Medicine   Office Visit Note  Patient Name: Billy Liu DOB: 1932/12/06 MRN [id number]  Date of Service: 12/18/2017  Complaints/HPI: Patient is referred for tracheostomy care.  [facility] [place]  Currently presents with tracheostomy in place he has a PMV in place also.  He does have issues with secretions.  He is going to need an ENT evaluation to see if he is going to be able to be decannulated.  I suspect because of his inability to handle secretions properly he will not be decannulated.  ROS  General: (-) fever, (-) chills, (-) night sweats, (-) weakness Skin: (-) rashes, (-) itching,. Eyes: (-) visual changes, (-) redness, (-) itching. Nose and Sinuses: (-) nasal stuffiness or itchiness, (-) postnasal drip, (-) nosebleeds, (-) sinus trouble. Mouth and Throat: (-) sore throat, (-) hoarseness. Neck: (-) swollen glands, (-) enlarged thyroid, (-) neck pain. Respiratory: + cough, (-) bloody sputum, + shortness of breath, + wheezing. Cardiovascular: - ankle swelling, (-) chest pain. Lymphatic: (-) lymph node enlargement. Neurologic: (-) numbness, (-) tingling. Psychiatric: (-) anxiety, (-) depression   Current Medication: Outpatient Encounter Medications as of 12/18/2017  Medication Sig  . amantadine (SYMMETREL) 50 MG/5ML solution Take 100 mg by mouth 2 (two) times daily.  [name] amLODipine (NORVASC) 5 MG tablet Take 5 mg by mouth daily.  [name] antiseptic oral rinse (BIOTENE) LIQD 15 mLs by Mouth Rinse route as needed for dry mouth.  [name] aspirin 81 MG chewable tablet Chew by mouth daily.  [name] atorvastatin (LIPITOR) 10 MG tablet Take 10 mg by mouth daily.  . Cholecalciferol (VITAMIN D3) 50 MCG (2000 UT) TABS Take by mouth.  . clonazePAM (KLONOPIN) 1 MG tablet Take 1 tablet (1 mg total) by mouth 2 (two) times daily.  [name] doxycycline  (VIBRAMYCIN) 100 MG capsule Take 100 mg by mouth 2 (two) times daily.  . famotidine (PEPCID) 20 MG tablet Place 1 tablet (20 mg total) into feeding tube daily.  . ferrous sulfate 220 (44 Fe) MG/5ML solution Take 220 mg by mouth daily.  . furosemide (LASIX) 10 MG/ML solution Take 2 mLs (20 mg total) by mouth 2 (two) times daily.  . heparin 5000 UNIT/ML injection Inject 1 mL (5,000 Units total) into the skin every 8 (eight) hours.  . hydrALAZINE (APRESOLINE) 10 MG tablet Take 10 mg by mouth 3 (three) times daily.  . insulin aspart (NOVOLOG) 100 UNIT/ML injection Inject 0-20 Units into the skin every 4 (four) hours.  . insulin detemir (LEVEMIR) 100 UNIT/ML injection Inject 0.1 mLs (10 Units total) into the skin 2 (two) times daily.  . insulin lispro (HUMALOG) 100 UNIT/ML injection Inject into the skin 3 (three) times daily before meals.  . Lactobacillus (ACIDOPHILUS) 0.5 MG TABS Take by mouth.  [name] lisinopril (PRINIVIL,ZESTRIL) 20 MG tablet Take 1 tablet (20 mg total) by mouth daily.  . Melatonin 3 MG TABS Take by mouth.  . Multiple Vitamin (MULTIVITAMIN) LIQD Place 15 mLs into feeding tube daily.  . Nutritional Supplements (FEEDING SUPPLEMENT, VITAL 1.5 CAL,) LIQD Place 1,000 mLs into feeding tube continuous.  [name] QUEtiapine (SEROQUEL) 25 MG tablet Place 1 tablet (25 mg total) into feeding tube 2 (two) times daily.  [name] scopolamine (TRANSDERM-SCOP) 1 MG/3DAYS Place 1 patch onto the skin every 3 (three) days.  [name] sertraline (ZOLOFT) 25 MG tablet Take 25 mg by mouth daily.  [name] valproic acid (DEPAKENE)  250 MG/5ML SOLN solution Place 10 mLs (500 mg total) into feeding tube every 8 (eight) hours.  . Amino Acids-Protein Hydrolys (FEEDING SUPPLEMENT, PRO-STAT SUGAR FREE 64,) LIQD Place 60 mLs into feeding tube 2 (two) times daily.  . fentaNYL (DURAGESIC - DOSED MCG/HR) 100 MCG/HR Place 1 patch (100 mcg total) onto the skin every 3 (three) days.  . [DISCONTINUED] amLODipine (NORVASC) 10 MG tablet Place 1 tablet  (10 mg total) into feeding tube daily. (Patient not taking: Reported on 12/18/2017)  . [DISCONTINUED] bisacodyl (DULCOLAX) 5 MG EC tablet Take 1 tablet (5 mg total) by mouth daily as needed for moderate constipation. (Patient not taking: Reported on 12/18/2017)   No facility-administered encounter medications on file as of 12/18/2017.     Surgical History: Past Surgical History:  Procedure Laterality Date  . COLONOSCOPY N/A 06/20/2017   Procedure: COLONOSCOPY;  Surgeon: Toney Reil, MD;  Location: [facility] ENDOSCOPY;  Service: Gastroenterology;  Laterality: N/A;  . ESOPHAGOGASTRODUODENOSCOPY N/A 06/20/2017   Procedure: ESOPHAGOGASTRODUODENOSCOPY (EGD);  Surgeon: Toney Reil, MD;  Location: [facility] ENDOSCOPY;  Service: Gastroenterology;  Laterality: N/A;  . LOWER EXTREMITY ANGIOGRAPHY Left 02/11/2017   Procedure: LOWER EXTREMITY ANGIOGRAPHY;  Surgeon: Renford Dills, MD;  Location: [facility] INVASIVE CV LAB;  Service: Cardiovascular;  Laterality: Left;  . LOWER EXTREMITY ANGIOGRAPHY Left 03/11/2017   Procedure: LOWER EXTREMITY ANGIOGRAPHY;  Surgeon: Renford Dills, MD;  Location: [facility] INVASIVE CV LAB;  Service: Cardiovascular;  Laterality: Left;  . LOWER EXTREMITY INTERVENTION  03/11/2017   Procedure: LOWER EXTREMITY INTERVENTION;  Surgeon: Renford Dills, MD;  Location: [facility] INVASIVE CV LAB;  Service: Cardiovascular;;  . PEG PLACEMENT N/A 10/23/2017   Procedure: PERCUTANEOUS ENDOSCOPIC GASTROSTOMY (PEG) PLACEMENT;  Surgeon: Midge Minium, MD;  Location: [facility] ENDOSCOPY;  Service: Endoscopy;  Laterality: N/A;  . PERIPHERAL VASCULAR ATHERECTOMY Left 02/18/2017   Procedure: PERIPHERAL VASCULAR ATHERECTOMY;  Surgeon: Renford Dills, MD;  Location: [facility] INVASIVE CV LAB;  Service: Cardiovascular;  Laterality: Left;  . TRACHEOSTOMY TUBE PLACEMENT N/A 10/20/2017   Procedure: TRACHEOSTOMY;  Surgeon: Bud Face, MD;  Location: [facility] ORS;  Service: ENT;  Laterality: N/A;    Medical  History: Past Medical History:  Diagnosis Date  . Anxiety   . Cellulitis 10/17/2014   left lower leg  . Chronic diastolic CHF (congestive heart failure) (HCC)   . CKD (chronic kidney disease), stage III (HCC)   . COPD (chronic obstructive pulmonary disease) (HCC)   . Coronary artery disease   . Dementia (HCC)    Alzheimer's   . Depression   . Diabetes mellitus without complication (HCC)   . GERD (gastroesophageal reflux disease)   . Hyperlipemia   . Hypertension   . Obesity   . Renal disorder     Family History: Family History  Problem Relation Age of Onset  . Cancer Brother     Social History: Social History   Socioeconomic History  . Marital status: Married    Spouse name: Not on file  . Number of children: Not on file  . Years of education: Not on file  . Highest education level: Not on file  Occupational History  . Not on file  Social Needs  . Financial resource strain: Not on file  . Food insecurity:    Worry: Not on file    Inability: Not on file  . Transportation needs:    Medical: Not on file    Non-medical: Not on file  Tobacco Use  . Smoking status: Never  Smoker  . Smokeless tobacco: Former NeurosurgeonUser    Types: Chew  Substance and Sexual Activity  . Alcohol use: No  . Drug use: No  . Sexual activity: Not on file  Lifestyle  . Physical activity:    Days per week: Not on file    Minutes per session: Not on file  . Stress: Not on file  Relationships  . Social connections:    Talks on phone: Not on file    Gets together: Not on file    Attends religious service: Not on file    Active member of club or organization: Not on file    Attends meetings of clubs or organizations: Not on file    Relationship status: Not on file  . Intimate partner violence:    Fear of current or ex partner: Not on file    Emotionally abused: Not on file    Physically abused: Not on file    Forced sexual activity: Not on file  Other Topics Concern  . Not on file   Social History Narrative  . Not on file    Vital Signs: Blood pressure (!) 142/68, pulse (!) 56, resp. rate 16, height 5\' 11"  (1.803 m), weight 269 lb (122 kg), SpO2 93 %.  Examination: General Appearance: The patient is well-developed, well-nourished, and in no distress. Skin: Gross inspection of skin unremarkable. Head: normocephalic, no gross deformities. Eyes: no gross deformities noted. ENT: ears appear grossly normal no exudates. Neck: Supple. No thyromegaly. No LAD. Respiratory: scattered rhonchi noted. Cardiovascular: Normal S1 and S2 without murmur or rub. Extremities: No cyanosis. pulses are equal. Neurologic: Alert and oriented. No involuntary movements.  LABS:   Radiology: Dg Abdomen Peg Tube Location  Result Date: 10/27/2017 CLINICAL DATA:  Peg tube placement EXAM: ABDOMEN - 1 VIEW COMPARISON:  10/24/2017 FINDINGS: Gastrostomy tube projects over the body of the stomach. Small amount of radiopaque contrast within the gastric fundus. No obvious extravasation. Mild diffuse increased bowel gas without obstructive pattern. IMPRESSION: Gastrostomy tube projects over the body of the stomach. Small amount of contrast material in the fundus of the stomach. No obvious extravasation. Electronically Signed   By: Jasmine PangKim  Fujinaga M.D.   On: 10/27/2017 21:18   Dg Chest Port 1 View  Result Date: 10/27/2017 CLINICAL DATA:  Respiratory failure EXAM: PORTABLE CHEST 1 VIEW COMPARISON:  10/27/2017, 10/24/2017 FINDINGS: Right upper extremity catheter tip overlies the SVC. Tracheostomy tube tip about 6 cm superior to the carina. Mild cardiomegaly with vascular congestion and mild diffuse interstitial and hazy opacity likely reflecting mild edema. Patchy more confluent opacities at the bases. This appears slightly worse in the right base. No pneumothorax. IMPRESSION: 1. Increasing bibasilar atelectasis or infiltrates. 2. Cardiomegaly with vascular congestion and mild diffuse background pulmonary  edema. Electronically Signed   By: Jasmine PangKim  Fujinaga M.D.   On: 10/27/2017 21:19   Dg Chest Port 1 View  Result Date: 10/27/2017 CLINICAL DATA:  Recent aspiration EXAM: PORTABLE CHEST 1 VIEW COMPARISON:  10/24/2017 FINDINGS: Tracheostomy tube and right-sided PICC line are again seen and stable. Cardiac shadow remains enlarged. Some increasing basilar atelectasis is noted bilaterally. No pneumothorax or sizable effusion is noted. No other focal abnormality is noted. IMPRESSION: Increasing bibasilar atelectasis. Electronically Signed   By: Alcide CleverMark  Lukens M.D.   On: 10/27/2017 13:12    No results found.  Ct Head Wo Contrast  Result Date: 11/26/2017 CLINICAL DATA:  Unexplained altered level of consciousness EXAM: CT HEAD WITHOUT CONTRAST TECHNIQUE: Contiguous axial  images were obtained from the base of the skull through the vertex without intravenous contrast. COMPARISON:  10/09/2017 FINDINGS: Brain: No evidence of acute infarction, hemorrhage, hydrocephalus, extra-axial collection or mass lesion/mass effect. 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 Small vessel ischemic gliosis in the cerebral white matter. Small remote left cerebellar infarct. Vascular: Atherosclerotic calcification Skull: Unremarkable remote left parietal craniotomy. Sinuses/Orbits: Chronic partial bilateral mastoid opacification. Other: Motion degraded. IMPRESSION: 1. No acute finding. 2. Atrophy and chronic ischemic injury. Electronically Signed   By: Marnee Spring M.D.   On: 11/26/2017 09:28   Dg Chest Port 1 View  Result Date: 11/25/2017 CLINICAL DATA:  Unresponsive, hypotension EXAM: PORTABLE CHEST 1 VIEW COMPARISON:  Portable chest x-ray of 11/17/2016 FINDINGS: The lungs are not well aerated with mild basilar volume loss. No pneumonia or effusion is seen. Tracheostomy appears to remain unchanged in position. The heart is within upper limits normal and stable. There are degenerative changes throughout  the thoracic spine. IMPRESSION: 1. No acute abnormality.  No pneumonia or pleural effusion. 2. Stable position of tracheostomy. Electronically Signed   By: Dwyane Dee M.D.   On: 11/25/2017 15:15      Assessment and Plan: Patient Active Problem List   Diagnosis Date Noted  . Healthcare-associated pneumonia 10/28/2017  . Acute kidney injury superimposed on chronic kidney disease (HCC) 10/28/2017  . Dementia without behavioral disturbance (HCC) 10/28/2017  . Dysphagia   . Cardiac arrest (HCC) 10/07/2017  . Iron deficiency anemia due to chronic blood loss   . Symptomatic anemia 06/17/2017  . Pressure injury of skin 06/17/2017  . Atherosclerosis of native arteries of extremity with intermittent claudication (HCC) 12/25/2016  . Chronic venous insufficiency 12/25/2016  . Lymphedema 10/23/2016  . Hyperlipidemia 10/23/2016  . Chronic diastolic heart failure (HCC) 11/10/2014  . Obstructive sleep apnea 11/10/2014  . Acute on chronic respiratory failure with hypoxia (HCC) 10/11/2014  . CAD (coronary artery disease) 10/11/2014  . COPD (chronic obstructive pulmonary disease) (HCC) 10/11/2014  . Type 2 diabetes mellitus (HCC) 10/11/2014  . HTN (hypertension) 10/11/2014  . GERD (gastroesophageal reflux disease) 10/11/2014  . CKD (chronic kidney disease), stage III (HCC) 10/11/2014    1. Chronic respiratory failure with hypoxia he is doing fine at this time has issues with secretions with his tracheostomy needs aggressive pulmonary toilet oxygen as necessary continue with supportive care. 2. Tracheostomy status recommended follow-up with ENT to see if he is a candidate for downsizing or decannulation I suspect he will need to tracheostomy long-term 3. COPD severe disease not currently able to do pulmonary functions will have him reassessed we will continue with supportive care 4. CAD stable at this time will monitor 5. Lobar pneumonia this is resolved clinically we will monitor as  necessary  General Counseling: I have discussed the findings of the evaluation and examination with Lyman Bishop.  I have also discussed any further diagnostic evaluation thatmay be needed or ordered today. Treveon verbalizes understanding of the findings of todays visit. We also reviewed his medications today and discussed drug interactions and side effects including but not limited excessive drowsiness and altered mental states. We also discussed that there is always a risk not just to him but also people around him. he has been encouraged to call the office with any questions or concerns that should arise related to todays visit.    Time spent: 25 min  I have personally obtained a history, examined the patient, evaluated laboratory and imaging results, formulated the assessment  and plan and placed orders.    Bunny Lowdermilk A Ayelen Sciortino, MD FCCP Pulmonary and Critical Care Sleep medicine 

## 2018-01-29 ENCOUNTER — Other Ambulatory Visit
Admission: RE | Admit: 2018-01-29 | Discharge: 2018-01-29 | Disposition: A | Discharge status: Home / Self Care | Payer: Medicare Other | Source: Ambulatory Visit | Attending: Family Medicine | Admitting: Family Medicine

## 2018-01-29 DIAGNOSIS — R093 Abnormal sputum: Secondary | ICD-10-CM | POA: Diagnosis present

## 2018-01-29 LAB — EXPECTORATED SPUTUM ASSESSMENT W REFEX TO RESP CULTURE

## 2018-01-29 LAB — EXPECTORATED SPUTUM ASSESSMENT W GRAM STAIN, RFLX TO RESP C

## 2018-02-01 LAB — CULTURE, RESPIRATORY W GRAM STAIN: Culture: NORMAL

## 2018-03-08 DEATH — deceased

## 2018-06-29 ENCOUNTER — Ambulatory Visit: Payer: Self-pay | Admitting: Internal Medicine

## 2019-07-10 IMAGING — DX DG CHEST 1V PORT
2 series · 2 of 2 positions shown · non-contrast
Comparison: Radiograph October 19, 2017.

CLINICAL DATA: Status post intubation.

EXAM:
PORTABLE CHEST 1 VIEW

[chest ap (1 of 2)]
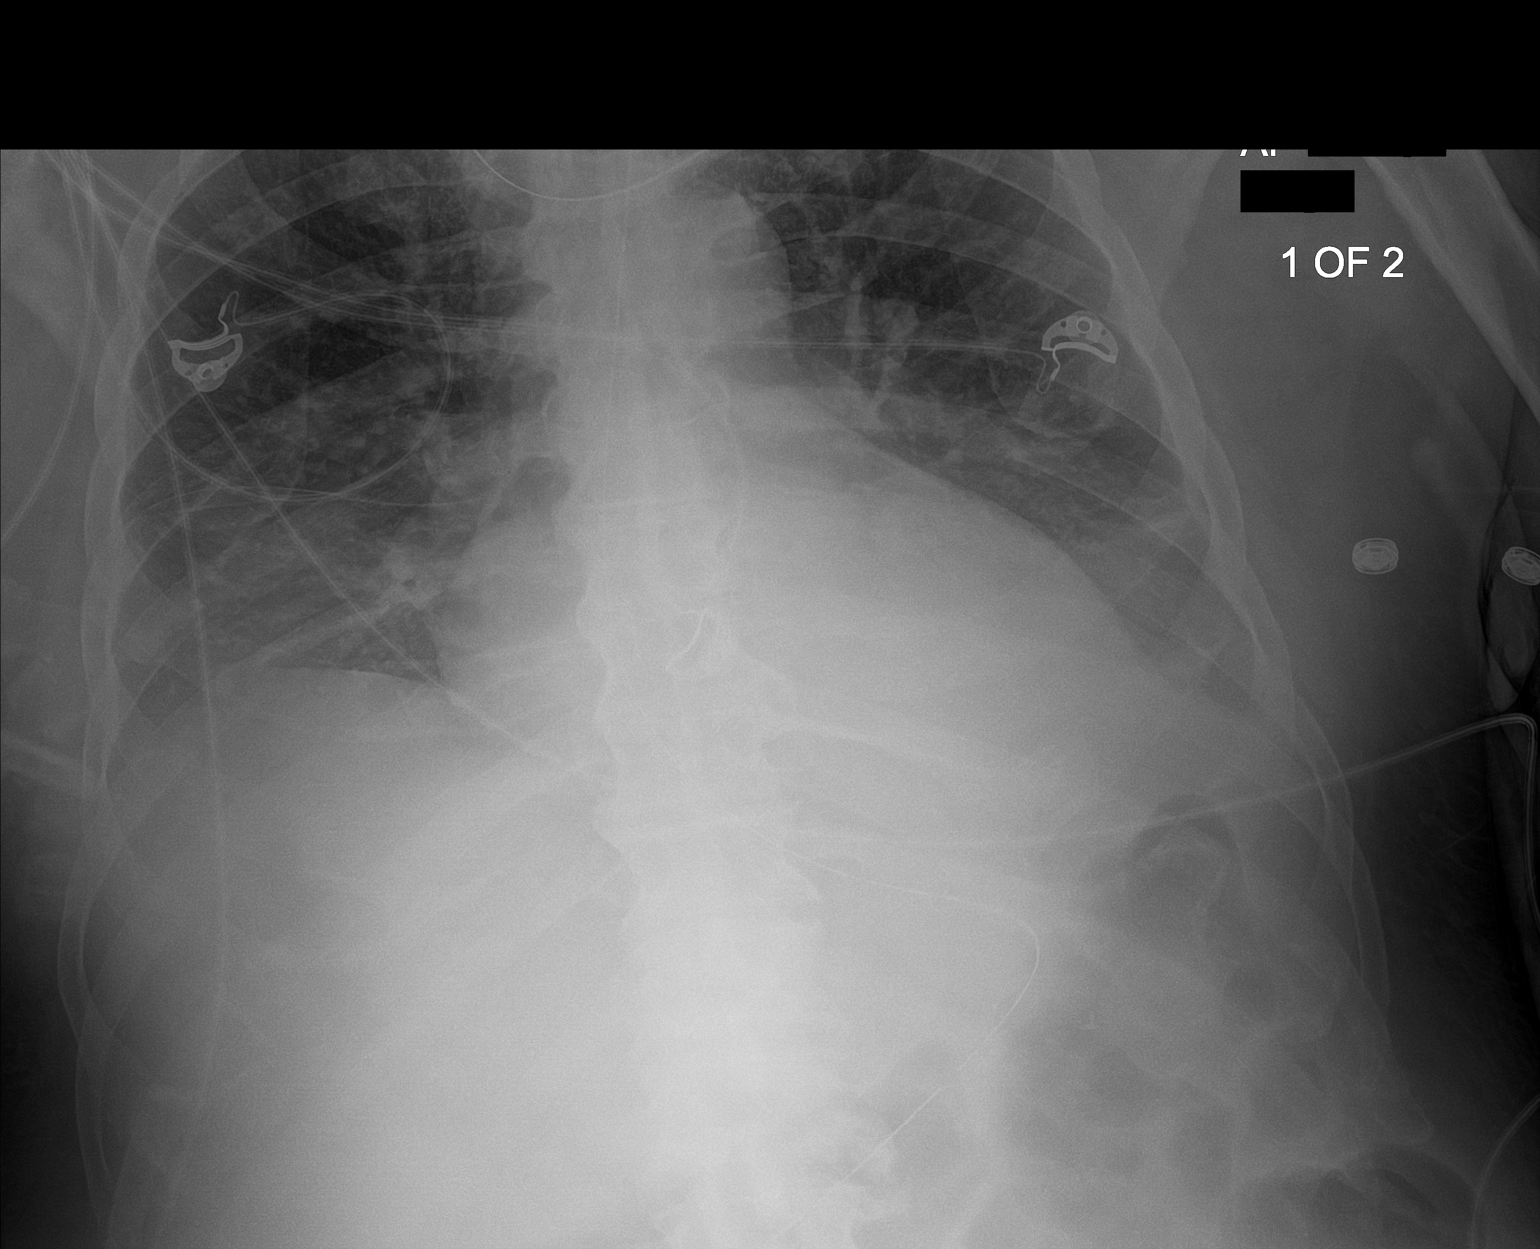

[chest ap (2 of 2)]
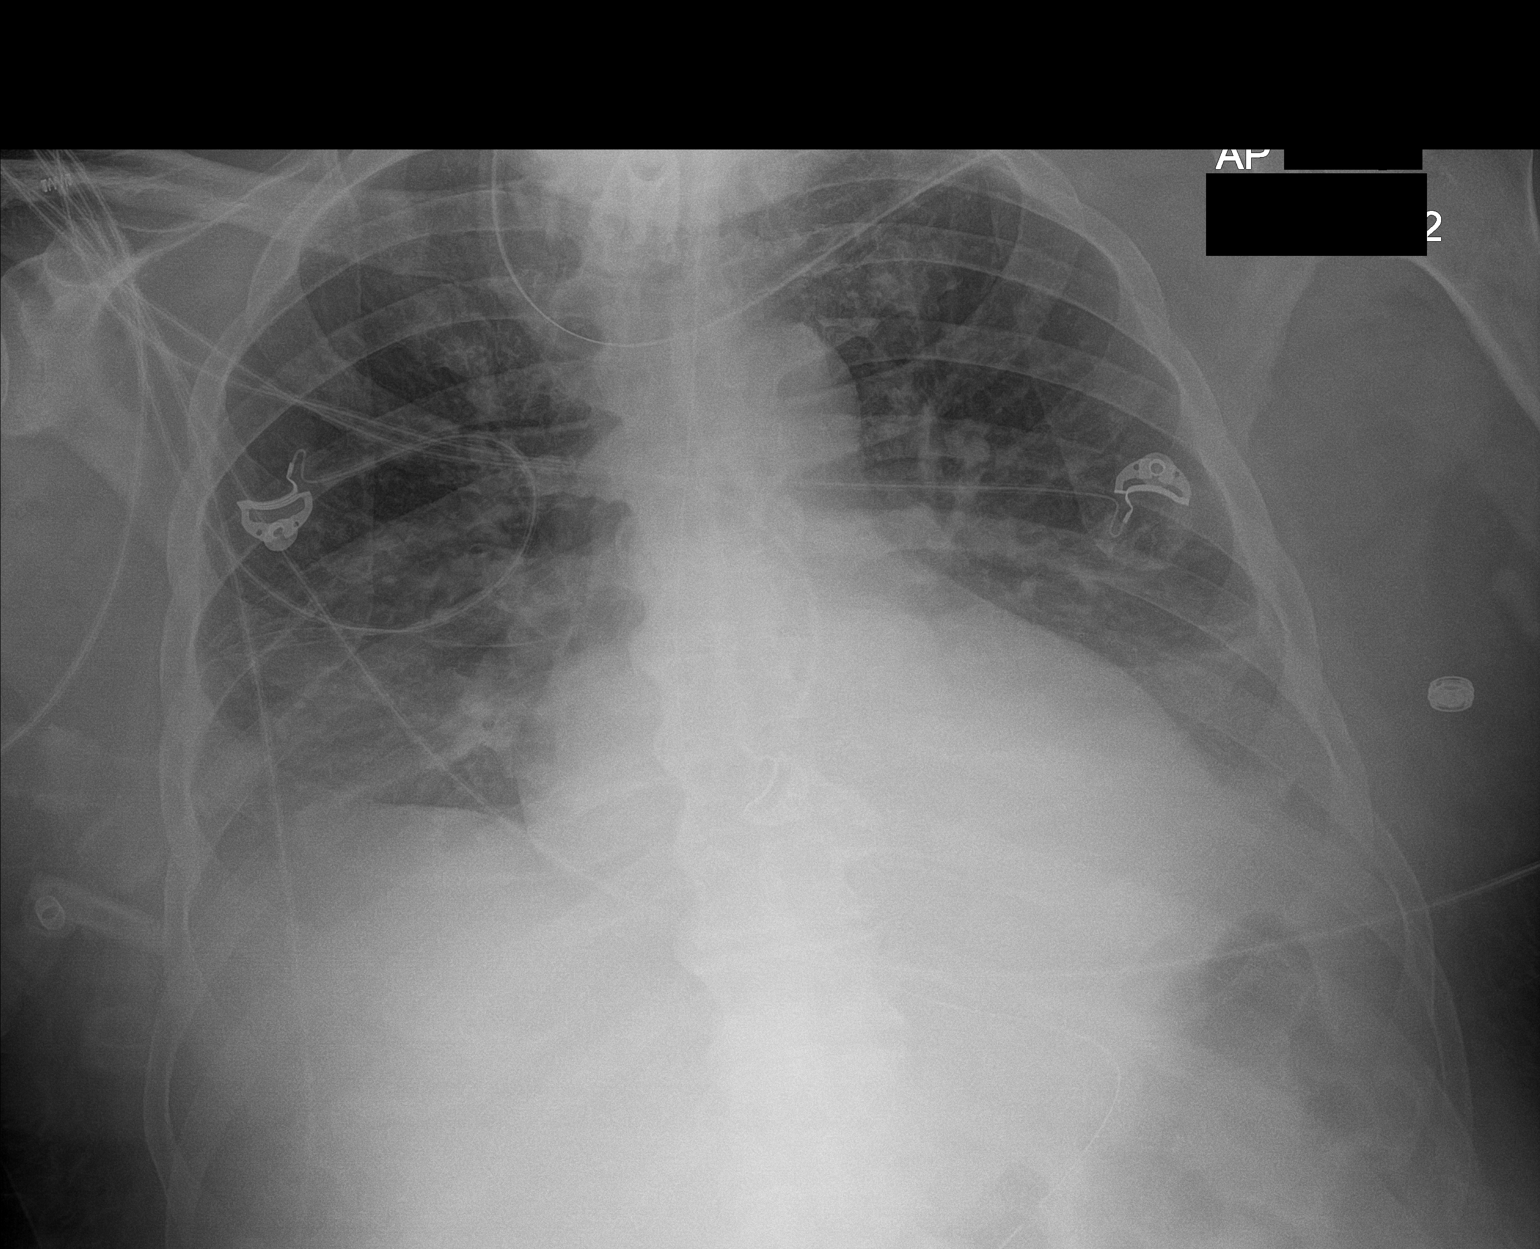

[2 of 2 positions shown; findings below may reference images not displayed]

FINDINGS: Stable cardiomediastinal silhouette. Endotracheal tube has been
removed. Tracheostomy tube is now seen in grossly good position.
Nasogastric tube is unchanged in position. No pneumothorax is noted.
Mild bibasilar subsegmental atelectasis is noted with associated
pleural effusions. Bony thorax is unremarkable.
IMPRESSION: Tracheostomy tube in grossly good position. Stable bibasilar
subsegmental atelectasis with associated pleural effusions.

## 2019-07-10 IMAGING — DX DG FOOT 2V*L*
2 series · 2 of 2 positions shown · non-contrast
Comparison: None.

CLINICAL DATA: Gangrenous left foot.

EXAM:
LEFT FOOT - 2 VIEW

[foot ap]
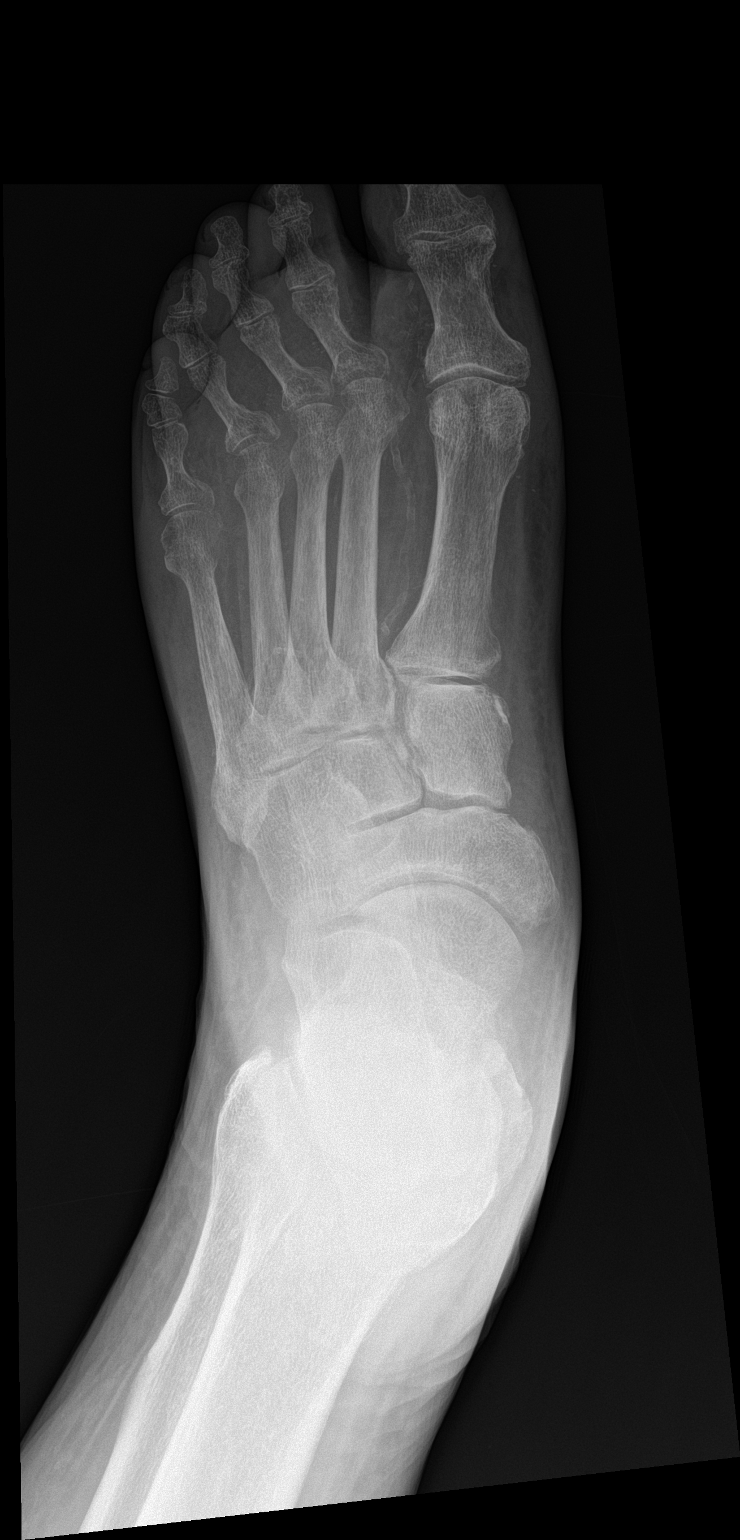

[foot lat]
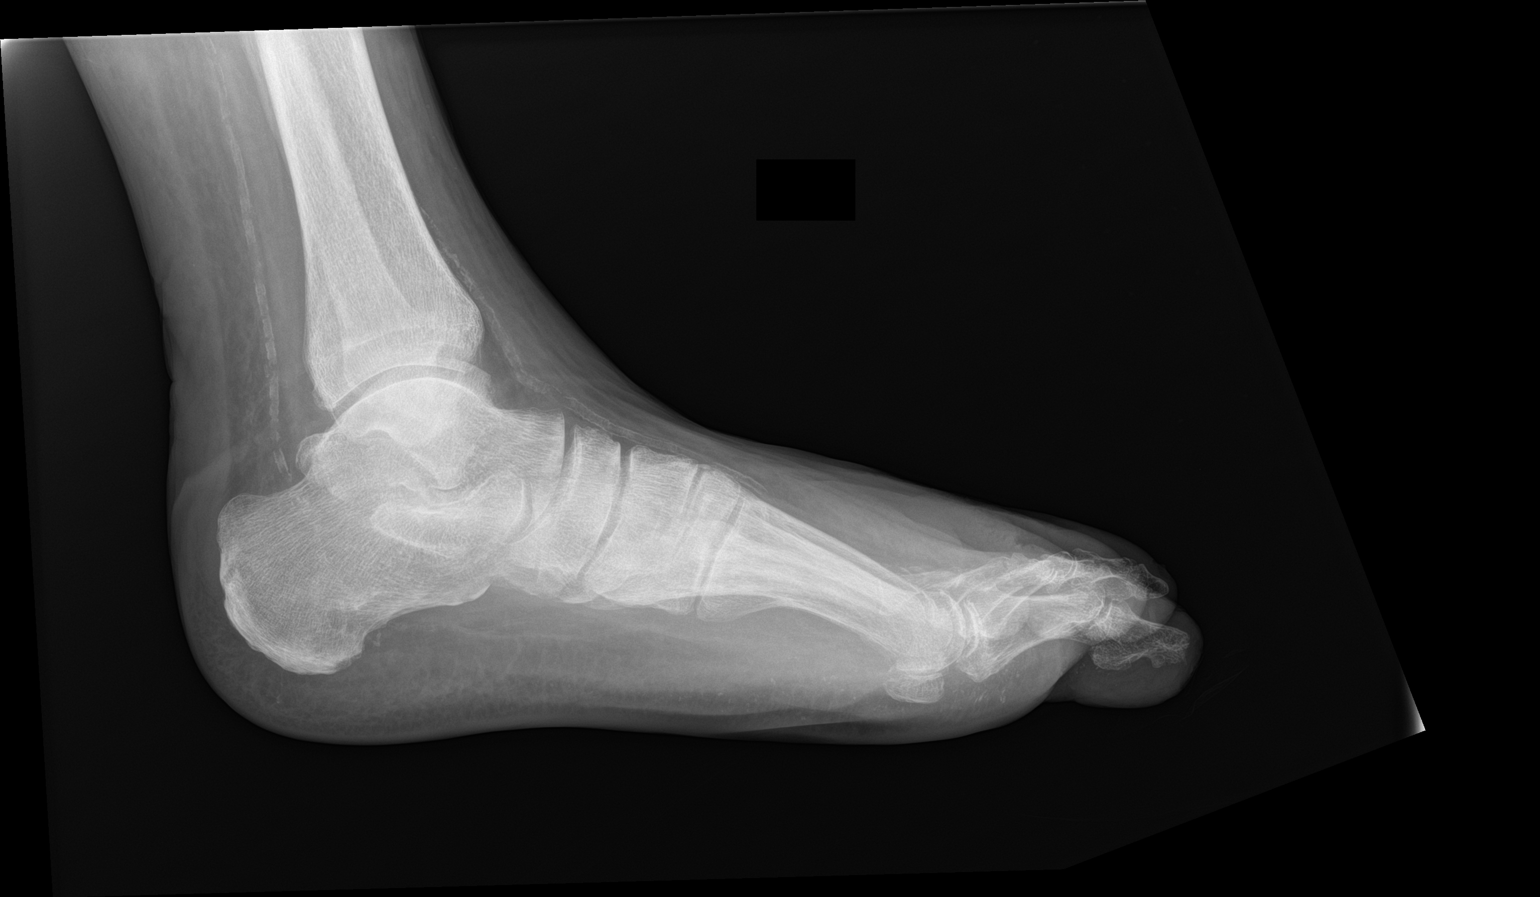

[2 of 2 positions shown; findings below may reference images not displayed]

FINDINGS: There is no evidence of fracture or dislocation. There is no
evidence of arthropathy or other focal bone abnormality. Vascular
calcifications are noted. No lytic destruction is seen to suggest
osteomyelitis. Diffuse soft tissue swelling is noted..
IMPRESSION: No significant bony abnormality is noted. Diffuse soft tissue
swelling is noted suggesting inflammation or edema.

## 2019-07-12 IMAGING — DX DG CHEST 1V PORT
1 series · 1 of 1 positions shown · non-contrast
Comparison: 10/22/2017

CLINICAL DATA: Short of breath

EXAM:
PORTABLE CHEST 1 VIEW

[chest ap]
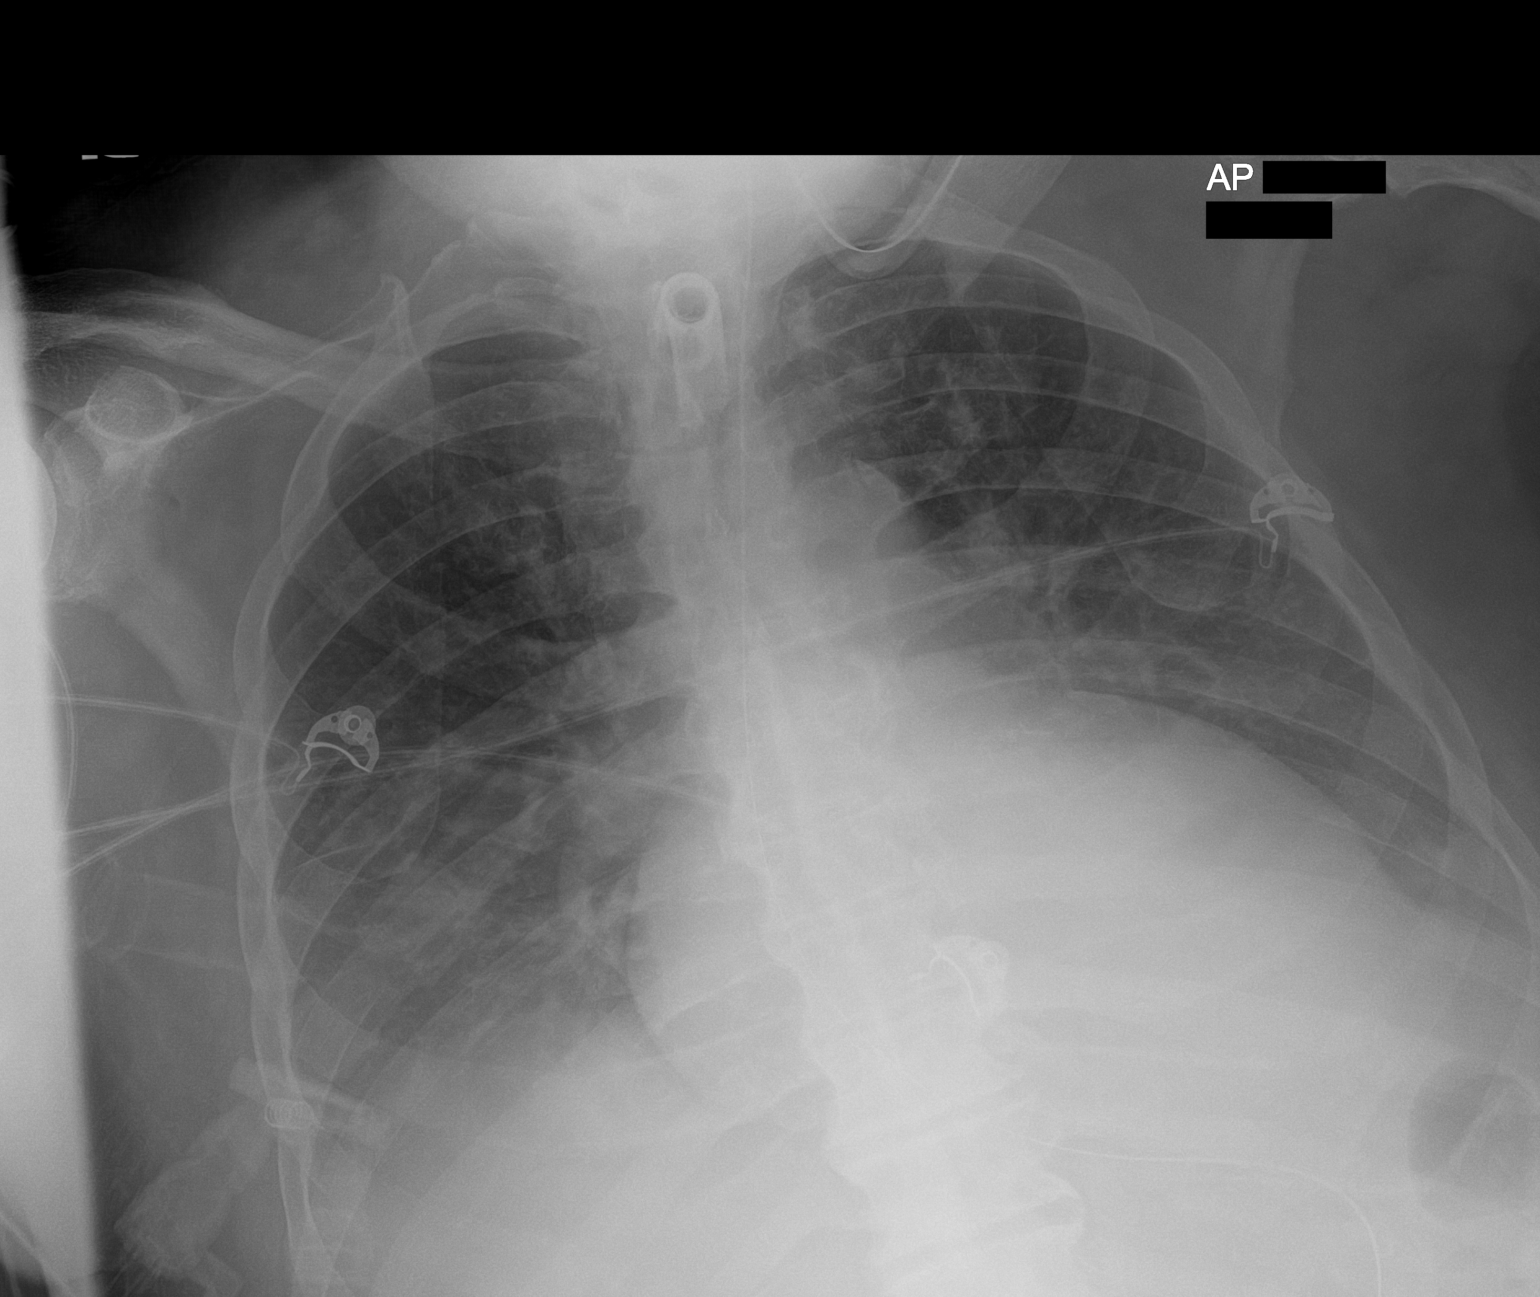

[1 of 1 positions shown; findings below may reference images not displayed]

FINDINGS: Tracheostomy in good position.  NG tube in the stomach.

Bibasilar airspace disease unchanged. Airspace disease more severe
on the left than the right. Small pleural effusions unchanged.
IMPRESSION: Bibasilar atelectasis/infiltrate and bilateral effusions unchanged
from the prior study.
